# Patient Record
Sex: Male | Born: 1935
Health system: Southern US, Community
[De-identification: ages and names within clinical notes are randomized; demographics above are authoritative.]

## PROBLEM LIST (undated history)

## (undated) DIAGNOSIS — J449 Chronic obstructive pulmonary disease, unspecified: Secondary | ICD-10-CM

## (undated) DIAGNOSIS — K219 Gastro-esophageal reflux disease without esophagitis: Secondary | ICD-10-CM

## (undated) DIAGNOSIS — D696 Thrombocytopenia, unspecified: Secondary | ICD-10-CM

## (undated) DIAGNOSIS — E782 Mixed hyperlipidemia: Secondary | ICD-10-CM

## (undated) DIAGNOSIS — I872 Venous insufficiency (chronic) (peripheral): Secondary | ICD-10-CM

## (undated) DIAGNOSIS — G4730 Sleep apnea, unspecified: Secondary | ICD-10-CM

## (undated) DIAGNOSIS — I272 Pulmonary hypertension, unspecified: Secondary | ICD-10-CM

## (undated) DIAGNOSIS — E119 Type 2 diabetes mellitus without complications: Secondary | ICD-10-CM

## (undated) DIAGNOSIS — Z950 Presence of cardiac pacemaker: Secondary | ICD-10-CM

## (undated) HISTORY — DX: Presence of cardiac pacemaker: Z95.0

## (undated) HISTORY — DX: Mixed hyperlipidemia: E78.2

## (undated) HISTORY — DX: Venous insufficiency (chronic) (peripheral): I87.2

## (undated) HISTORY — PX: PACEMAKER IMPLANT: EP1218

## (undated) HISTORY — DX: Thrombocytopenia, unspecified: D69.6

## (undated) HISTORY — DX: Sleep apnea, unspecified: G47.30

## (undated) HISTORY — PX: REFRACTIVE SURGERY: SHX103

## (undated) HISTORY — DX: Gastro-esophageal reflux disease without esophagitis: K21.9

## (undated) HISTORY — DX: Pulmonary hypertension, unspecified: I27.20

## (undated) HISTORY — PX: KIDNEY STONE SURGERY: SHX686

## (undated) HISTORY — PX: CATARACT EXTRACTION, BILATERAL: SHX1313

## (undated) HISTORY — DX: Chronic obstructive pulmonary disease, unspecified: J44.9

## (undated) HISTORY — PX: ROTATOR CUFF REPAIR: SHX139

## (undated) HISTORY — DX: Type 2 diabetes mellitus without complications: E11.9

---

## 2011-10-26 DIAGNOSIS — Z0000 Encounter for general adult medical examination without abnormal findings: Secondary | ICD-10-CM | POA: Diagnosis not present

## 2011-10-26 DIAGNOSIS — E119 Type 2 diabetes mellitus without complications: Secondary | ICD-10-CM | POA: Diagnosis not present

## 2011-10-26 DIAGNOSIS — I10 Essential (primary) hypertension: Secondary | ICD-10-CM | POA: Diagnosis not present

## 2011-10-26 DIAGNOSIS — E785 Hyperlipidemia, unspecified: Secondary | ICD-10-CM | POA: Diagnosis not present

## 2011-10-29 DIAGNOSIS — Z0000 Encounter for general adult medical examination without abnormal findings: Secondary | ICD-10-CM | POA: Diagnosis not present

## 2011-10-29 DIAGNOSIS — E119 Type 2 diabetes mellitus without complications: Secondary | ICD-10-CM | POA: Diagnosis not present

## 2012-01-12 DIAGNOSIS — Z45018 Encounter for adjustment and management of other part of cardiac pacemaker: Secondary | ICD-10-CM | POA: Diagnosis not present

## 2012-01-12 DIAGNOSIS — I495 Sick sinus syndrome: Secondary | ICD-10-CM | POA: Diagnosis not present

## 2012-04-12 DIAGNOSIS — Z45018 Encounter for adjustment and management of other part of cardiac pacemaker: Secondary | ICD-10-CM | POA: Diagnosis not present

## 2012-04-12 DIAGNOSIS — I495 Sick sinus syndrome: Secondary | ICD-10-CM | POA: Diagnosis not present

## 2012-09-28 DIAGNOSIS — E119 Type 2 diabetes mellitus without complications: Secondary | ICD-10-CM | POA: Diagnosis not present

## 2012-09-28 DIAGNOSIS — I10 Essential (primary) hypertension: Secondary | ICD-10-CM | POA: Diagnosis not present

## 2012-09-28 DIAGNOSIS — Z950 Presence of cardiac pacemaker: Secondary | ICD-10-CM | POA: Diagnosis not present

## 2012-10-26 DIAGNOSIS — E119 Type 2 diabetes mellitus without complications: Secondary | ICD-10-CM | POA: Diagnosis not present

## 2012-10-26 DIAGNOSIS — Z0000 Encounter for general adult medical examination without abnormal findings: Secondary | ICD-10-CM | POA: Diagnosis not present

## 2012-10-26 DIAGNOSIS — K5900 Constipation, unspecified: Secondary | ICD-10-CM | POA: Diagnosis not present

## 2012-10-26 DIAGNOSIS — Z23 Encounter for immunization: Secondary | ICD-10-CM | POA: Diagnosis not present

## 2012-10-26 DIAGNOSIS — E785 Hyperlipidemia, unspecified: Secondary | ICD-10-CM | POA: Diagnosis not present

## 2012-11-20 DIAGNOSIS — I10 Essential (primary) hypertension: Secondary | ICD-10-CM | POA: Diagnosis not present

## 2012-11-20 DIAGNOSIS — E782 Mixed hyperlipidemia: Secondary | ICD-10-CM | POA: Diagnosis not present

## 2012-11-20 DIAGNOSIS — E119 Type 2 diabetes mellitus without complications: Secondary | ICD-10-CM | POA: Diagnosis not present

## 2013-01-24 DIAGNOSIS — I495 Sick sinus syndrome: Secondary | ICD-10-CM | POA: Diagnosis not present

## 2013-03-06 DIAGNOSIS — E1149 Type 2 diabetes mellitus with other diabetic neurological complication: Secondary | ICD-10-CM | POA: Diagnosis not present

## 2013-04-04 DIAGNOSIS — E119 Type 2 diabetes mellitus without complications: Secondary | ICD-10-CM | POA: Diagnosis not present

## 2013-04-04 DIAGNOSIS — E782 Mixed hyperlipidemia: Secondary | ICD-10-CM | POA: Diagnosis not present

## 2013-04-04 DIAGNOSIS — E785 Hyperlipidemia, unspecified: Secondary | ICD-10-CM | POA: Diagnosis not present

## 2013-04-04 DIAGNOSIS — J449 Chronic obstructive pulmonary disease, unspecified: Secondary | ICD-10-CM | POA: Diagnosis not present

## 2013-04-04 DIAGNOSIS — I10 Essential (primary) hypertension: Secondary | ICD-10-CM | POA: Diagnosis not present

## 2013-04-25 DIAGNOSIS — I495 Sick sinus syndrome: Secondary | ICD-10-CM | POA: Diagnosis not present

## 2013-05-15 DIAGNOSIS — E1149 Type 2 diabetes mellitus with other diabetic neurological complication: Secondary | ICD-10-CM | POA: Diagnosis not present

## 2013-07-19 DIAGNOSIS — E119 Type 2 diabetes mellitus without complications: Secondary | ICD-10-CM | POA: Diagnosis not present

## 2013-07-19 DIAGNOSIS — E782 Mixed hyperlipidemia: Secondary | ICD-10-CM | POA: Diagnosis not present

## 2013-07-19 DIAGNOSIS — I10 Essential (primary) hypertension: Secondary | ICD-10-CM | POA: Diagnosis not present

## 2013-07-19 DIAGNOSIS — J449 Chronic obstructive pulmonary disease, unspecified: Secondary | ICD-10-CM | POA: Diagnosis not present

## 2013-07-19 DIAGNOSIS — Z23 Encounter for immunization: Secondary | ICD-10-CM | POA: Diagnosis not present

## 2013-07-24 DIAGNOSIS — E1149 Type 2 diabetes mellitus with other diabetic neurological complication: Secondary | ICD-10-CM | POA: Diagnosis not present

## 2013-07-25 DIAGNOSIS — I10 Essential (primary) hypertension: Secondary | ICD-10-CM | POA: Diagnosis not present

## 2013-07-25 DIAGNOSIS — E119 Type 2 diabetes mellitus without complications: Secondary | ICD-10-CM | POA: Diagnosis not present

## 2013-07-25 DIAGNOSIS — E785 Hyperlipidemia, unspecified: Secondary | ICD-10-CM | POA: Diagnosis not present

## 2013-10-02 DIAGNOSIS — E1149 Type 2 diabetes mellitus with other diabetic neurological complication: Secondary | ICD-10-CM | POA: Diagnosis not present

## 2013-11-20 DIAGNOSIS — E119 Type 2 diabetes mellitus without complications: Secondary | ICD-10-CM | POA: Diagnosis not present

## 2013-11-20 DIAGNOSIS — I10 Essential (primary) hypertension: Secondary | ICD-10-CM | POA: Diagnosis not present

## 2013-11-20 DIAGNOSIS — E785 Hyperlipidemia, unspecified: Secondary | ICD-10-CM | POA: Diagnosis not present

## 2013-11-22 DIAGNOSIS — E119 Type 2 diabetes mellitus without complications: Secondary | ICD-10-CM | POA: Diagnosis not present

## 2013-11-22 DIAGNOSIS — E782 Mixed hyperlipidemia: Secondary | ICD-10-CM | POA: Diagnosis not present

## 2013-11-22 DIAGNOSIS — J449 Chronic obstructive pulmonary disease, unspecified: Secondary | ICD-10-CM | POA: Diagnosis not present

## 2013-11-22 DIAGNOSIS — I10 Essential (primary) hypertension: Secondary | ICD-10-CM | POA: Diagnosis not present

## 2014-01-22 DIAGNOSIS — E1149 Type 2 diabetes mellitus with other diabetic neurological complication: Secondary | ICD-10-CM | POA: Diagnosis not present

## 2014-03-08 DIAGNOSIS — E119 Type 2 diabetes mellitus without complications: Secondary | ICD-10-CM | POA: Diagnosis not present

## 2014-03-12 DIAGNOSIS — E119 Type 2 diabetes mellitus without complications: Secondary | ICD-10-CM | POA: Diagnosis not present

## 2014-03-12 DIAGNOSIS — E1142 Type 2 diabetes mellitus with diabetic polyneuropathy: Secondary | ICD-10-CM | POA: Diagnosis not present

## 2014-03-12 DIAGNOSIS — I10 Essential (primary) hypertension: Secondary | ICD-10-CM | POA: Diagnosis not present

## 2014-06-07 DIAGNOSIS — I10 Essential (primary) hypertension: Secondary | ICD-10-CM | POA: Diagnosis not present

## 2014-06-07 DIAGNOSIS — E119 Type 2 diabetes mellitus without complications: Secondary | ICD-10-CM | POA: Diagnosis not present

## 2014-06-11 DIAGNOSIS — E119 Type 2 diabetes mellitus without complications: Secondary | ICD-10-CM | POA: Diagnosis not present

## 2014-06-11 DIAGNOSIS — E1142 Type 2 diabetes mellitus with diabetic polyneuropathy: Secondary | ICD-10-CM | POA: Diagnosis not present

## 2014-06-11 DIAGNOSIS — I10 Essential (primary) hypertension: Secondary | ICD-10-CM | POA: Diagnosis not present

## 2014-06-11 DIAGNOSIS — J449 Chronic obstructive pulmonary disease, unspecified: Secondary | ICD-10-CM | POA: Diagnosis not present

## 2014-09-17 DIAGNOSIS — E785 Hyperlipidemia, unspecified: Secondary | ICD-10-CM | POA: Diagnosis not present

## 2014-09-17 DIAGNOSIS — E119 Type 2 diabetes mellitus without complications: Secondary | ICD-10-CM | POA: Diagnosis not present

## 2014-09-17 DIAGNOSIS — I10 Essential (primary) hypertension: Secondary | ICD-10-CM | POA: Diagnosis not present

## 2014-09-20 DIAGNOSIS — E785 Hyperlipidemia, unspecified: Secondary | ICD-10-CM | POA: Diagnosis not present

## 2014-09-20 DIAGNOSIS — I10 Essential (primary) hypertension: Secondary | ICD-10-CM | POA: Diagnosis not present

## 2014-09-20 DIAGNOSIS — L97821 Non-pressure chronic ulcer of other part of left lower leg limited to breakdown of skin: Secondary | ICD-10-CM | POA: Diagnosis not present

## 2014-09-20 DIAGNOSIS — Z23 Encounter for immunization: Secondary | ICD-10-CM | POA: Diagnosis not present

## 2014-09-20 DIAGNOSIS — E119 Type 2 diabetes mellitus without complications: Secondary | ICD-10-CM | POA: Diagnosis not present

## 2014-10-24 DIAGNOSIS — E08329 Diabetes mellitus due to underlying condition with mild nonproliferative diabetic retinopathy without macular edema: Secondary | ICD-10-CM | POA: Diagnosis not present

## 2014-10-24 DIAGNOSIS — H3531 Nonexudative age-related macular degeneration: Secondary | ICD-10-CM | POA: Diagnosis not present

## 2015-01-21 DIAGNOSIS — E782 Mixed hyperlipidemia: Secondary | ICD-10-CM | POA: Diagnosis not present

## 2015-01-21 DIAGNOSIS — E119 Type 2 diabetes mellitus without complications: Secondary | ICD-10-CM | POA: Diagnosis not present

## 2015-01-21 DIAGNOSIS — I10 Essential (primary) hypertension: Secondary | ICD-10-CM | POA: Diagnosis not present

## 2015-01-23 DIAGNOSIS — D696 Thrombocytopenia, unspecified: Secondary | ICD-10-CM | POA: Diagnosis not present

## 2015-01-23 DIAGNOSIS — I10 Essential (primary) hypertension: Secondary | ICD-10-CM | POA: Diagnosis not present

## 2015-01-23 DIAGNOSIS — E119 Type 2 diabetes mellitus without complications: Secondary | ICD-10-CM | POA: Diagnosis not present

## 2015-01-23 DIAGNOSIS — I872 Venous insufficiency (chronic) (peripheral): Secondary | ICD-10-CM | POA: Diagnosis not present

## 2015-05-30 DIAGNOSIS — I10 Essential (primary) hypertension: Secondary | ICD-10-CM | POA: Diagnosis not present

## 2015-05-30 DIAGNOSIS — E782 Mixed hyperlipidemia: Secondary | ICD-10-CM | POA: Diagnosis not present

## 2015-05-30 DIAGNOSIS — E119 Type 2 diabetes mellitus without complications: Secondary | ICD-10-CM | POA: Diagnosis not present

## 2015-06-10 DIAGNOSIS — G589 Mononeuropathy, unspecified: Secondary | ICD-10-CM | POA: Diagnosis not present

## 2015-06-10 DIAGNOSIS — I10 Essential (primary) hypertension: Secondary | ICD-10-CM | POA: Diagnosis not present

## 2015-06-10 DIAGNOSIS — L308 Other specified dermatitis: Secondary | ICD-10-CM | POA: Diagnosis not present

## 2015-06-10 DIAGNOSIS — E1140 Type 2 diabetes mellitus with diabetic neuropathy, unspecified: Secondary | ICD-10-CM | POA: Diagnosis not present

## 2015-06-19 ENCOUNTER — Encounter: Payer: Self-pay | Admitting: *Deleted

## 2015-06-19 ENCOUNTER — Other Ambulatory Visit: Payer: Self-pay | Admitting: *Deleted

## 2015-06-20 ENCOUNTER — Ambulatory Visit: Payer: Self-pay | Admitting: Cardiovascular Disease

## 2015-07-01 ENCOUNTER — Ambulatory Visit (INDEPENDENT_AMBULATORY_CARE_PROVIDER_SITE_OTHER): Payer: Medicare Other | Admitting: Cardiology

## 2015-07-01 ENCOUNTER — Encounter: Payer: Self-pay | Admitting: Cardiology

## 2015-07-01 VITALS — BP 148/85 | HR 75 | Ht 72.0 in | Wt 305.0 lb

## 2015-07-01 DIAGNOSIS — E785 Hyperlipidemia, unspecified: Secondary | ICD-10-CM | POA: Diagnosis not present

## 2015-07-01 DIAGNOSIS — I10 Essential (primary) hypertension: Secondary | ICD-10-CM

## 2015-07-01 DIAGNOSIS — Z136 Encounter for screening for cardiovascular disorders: Secondary | ICD-10-CM

## 2015-07-01 DIAGNOSIS — Z950 Presence of cardiac pacemaker: Secondary | ICD-10-CM

## 2015-07-01 NOTE — Progress Notes (Signed)
Patient ID: Alan Briggs, male   DOB: 12-25-1921, 81 y.o.   MRN: [id number]     Clinical Summary Alan Briggs is a 81 y.o.male seen today as a new patient for the following medical problems.  1.Sick sinus syndrome - history of pacemaker placement in 1996. Replaced in 2008 - previously followed at [facility]. From there notes he has a MDT Adapta DR device placed in 2008. Has not been checked in over a year - denies any lightheadnedness, no fatigue.    2. DM2 - followed by pcp   3. Hyperlipidemia - compliant with statin  4. HTN - does not check regularly - compliant with meds  5. OSA - history of OSA, has not wanted to wear CPAP  Past Medical History  Diagnosis Date  . Diabetes   . Cardiac pacemaker in situ     for sick sinus syndrome-last placement was 09/2007 [facility]  . Mixed hyperlipidemia   . Venous insufficiency (chronic) (peripheral)   . Thrombocytopenia   . Sleep apnea   . Pulmonary hypertension   . Obstructive lung disease     PFT's done 04/22/06   . GERD (gastroesophageal reflux disease)      Allergies  Allergen Reactions  . Advair Diskus [Fluticasone-Salmeterol]     Told by MD not to use  . Combivent [Ipratropium-Albuterol]     Told by MD not to use  . Lotensin Hct [Benazepril-Hydrochlorothiazide]     Told by MD not to use  . Penicillins Swelling  . Simvastatin Other (See Comments)    myalgias     Current Outpatient Prescriptions  Medication Sig Dispense Refill  . acetaminophen (TYLENOL) 500 MG tablet Take 500 mg by mouth every 6 (six) hours as needed.    Marland Kitchen aspirin EC 81 MG tablet Take 1 tablet by mouth daily.    Marland Kitchen atenolol (TENORMIN) 50 MG tablet Take 1 tablet by mouth daily.    . carboxymethylcellulose (REFRESH PLUS) 0.5 % SOLN Place 1 drop into both eyes 3 (three) times daily as needed.    . hydrochlorothiazide (HYDRODIURIL) 25 MG tablet Take 1 tablet by mouth daily.    . insulin glargine (LANTUS) 100 UNIT/ML injection Inject 55 Units into  the skin 2 (two) times daily.    Marland Kitchen latanoprost (XALATAN) 0.005 % ophthalmic solution Place 1 drop into the right eye at bedtime.    Marland Kitchen losartan (COZAAR) 50 MG tablet Take 1 tablet by mouth daily.    . Multiple Vitamins-Minerals (MULTIVITAMIN WITH MINERALS) tablet Take 1 tablet by mouth daily.    . Omega-3 1000 MG CAPS Take 2 g by mouth daily.    . rosuvastatin (CRESTOR) 10 MG tablet Take 10 mg by mouth 3 (three) times a week.     No current facility-administered medications for this visit.     Past Surgical History  Procedure Laterality Date  . Refractive surgery Right   . Cataract extraction, bilateral Bilateral   . Rotator cuff repair Left   . Kidney stone surgery Left     laser ablation      Allergies  Allergen Reactions  . Advair Diskus [Fluticasone-Salmeterol]     Told by MD not to use  . Combivent [Ipratropium-Albuterol]     Told by MD not to use  . Lotensin Hct [Benazepril-Hydrochlorothiazide]     Told by MD not to use  . Penicillins Swelling  . Simvastatin Other (See Comments)    myalgias      Family History  Problem  Relation Age of Onset  . Diabetes Mother      Social History Alan Briggs reports that he quit smoking about 36 years ago. His smoking use included Cigarettes. He started smoking about 66 years ago. He has a 15 pack-year smoking history. He has never used smokeless tobacco. Alan Briggs has no alcohol history on file.   Review of Systems CONSTITUTIONAL: No weight loss, fever, chills, weakness or fatigue.  HEENT: Eyes: No visual loss, blurred vision, double vision or yellow sclerae.No hearing loss, sneezing, congestion, runny nose or sore throat.  SKIN: No rash or itching.  CARDIOVASCULAR: per HPI RESPIRATORY: No shortness of breath, cough or sputum.  GASTROINTESTINAL: No anorexia, nausea, vomiting or diarrhea. No abdominal pain or blood.  GENITOURINARY: No burning on urination, no polyuria NEUROLOGICAL: No headache, dizziness, syncope, paralysis,  ataxia, numbness or tingling in the extremities. No change in bowel or bladder control.  MUSCULOSKELETAL: No muscle, back pain, joint pain or stiffness.  LYMPHATICS: No enlarged nodes. No history of splenectomy.  PSYCHIATRIC: No history of depression or anxiety.  ENDOCRINOLOGIC: No reports of sweating, cold or heat intolerance. No polyuria or polydipsia.  Marland Kitchen   Physical Examination Filed Vitals:   07/01/15 1341  BP: 148/85  Pulse: 75   Filed Vitals:   07/01/15 1341  Height: 6' (1.829 m)  Weight: 305 lb (138.347 kg)    Gen: resting comfortably, no acute distress HEENT: no scleral icterus, pupils equal round and reactive, no palptable cervical adenopathy,  CV: RRR, no m/r/g, no JVD Resp: Clear to auscultation bilaterally GI: abdomen is soft, non-tender, non-distended, normal bowel sounds, no hepatosplenomegaly MSK: extremities are warm, no edema.  Skin: warm, no rash Neuro:  no focal deficits Psych: appropriate affect    Assessment and Plan  1. Sick sinus syndrome - denies any current symptoms - will have him establish in our device clinic  2. Hyperlipidemia - request labs from pcp - continue statin  3. HTN - borderline control given his age, will continue to follow at this time.        Antoine Poche, M.D.

## 2015-07-01 NOTE — Patient Instructions (Signed)
Your physician wants you to follow-up in: 6 months with Dr. Lurena Joiner will receive a reminder letter in the mail two months in advance. If you don't receive a letter, please call our office to schedule the follow-up appointment.  Your physician recommends that you continue on your current medications as directed. Please refer to the Current Medication list given to you today.  You have been referred to DR. Ladona Ridgel [place]  Thank you for choosing [facility]!!

## 2015-07-28 ENCOUNTER — Encounter: Payer: BLUE CROSS/BLUE SHIELD | Admitting: Internal Medicine

## 2015-08-25 DIAGNOSIS — G589 Mononeuropathy, unspecified: Secondary | ICD-10-CM | POA: Diagnosis not present

## 2015-08-25 DIAGNOSIS — H6120 Impacted cerumen, unspecified ear: Secondary | ICD-10-CM | POA: Diagnosis not present

## 2015-08-25 DIAGNOSIS — I10 Essential (primary) hypertension: Secondary | ICD-10-CM | POA: Diagnosis not present

## 2015-08-25 DIAGNOSIS — R0602 Shortness of breath: Secondary | ICD-10-CM | POA: Diagnosis not present

## 2015-09-30 ENCOUNTER — Encounter: Payer: Self-pay | Admitting: Internal Medicine

## 2015-09-30 ENCOUNTER — Ambulatory Visit (INDEPENDENT_AMBULATORY_CARE_PROVIDER_SITE_OTHER): Payer: Medicare Other | Admitting: Internal Medicine

## 2015-09-30 VITALS — BP 138/82 | HR 76 | Ht 69.0 in | Wt 302.0 lb

## 2015-09-30 DIAGNOSIS — I442 Atrioventricular block, complete: Secondary | ICD-10-CM | POA: Diagnosis not present

## 2015-09-30 DIAGNOSIS — I5032 Chronic diastolic (congestive) heart failure: Secondary | ICD-10-CM

## 2015-09-30 DIAGNOSIS — Z950 Presence of cardiac pacemaker: Secondary | ICD-10-CM | POA: Diagnosis not present

## 2015-09-30 DIAGNOSIS — I872 Venous insufficiency (chronic) (peripheral): Secondary | ICD-10-CM | POA: Diagnosis not present

## 2015-09-30 DIAGNOSIS — I5033 Acute on chronic diastolic (congestive) heart failure: Secondary | ICD-10-CM | POA: Insufficient documentation

## 2015-09-30 NOTE — Progress Notes (Signed)
HPI Mr. Thal is here today for initial evaluation and management of his DDD PM. He is a pleasant elderly man with multiple medical problems including diastolic heart failure, obesity, copd, DM, HTN, and symptomatic bradycardia due to CHB, s/p PPM insertion. He is here to establish care, having gone to [facility] the past 20 years. No other complaints. He has chronic class 2 dyspnea and is sedentary and abuses sodium and lives by himself.  Allergies  Allergen Reactions  . Advair Diskus [Fluticasone-Salmeterol]     Told by MD not to use  . Combivent [Ipratropium-Albuterol]     Told by MD not to use  . Lotensin Hct [Benazepril-Hydrochlorothiazide]     Told by MD not to use  . Penicillins Swelling  . Simvastatin Other (See Comments)    myalgias     Current Outpatient Prescriptions  Medication Sig Dispense Refill  . acetaminophen (TYLENOL) 500 MG tablet Take 500 mg by mouth every 6 (six) hours as needed.    Marland Kitchen aspirin EC 81 MG tablet Take 1 tablet by mouth daily.    Marland Kitchen atenolol (TENORMIN) 50 MG tablet Take 1 tablet by mouth daily.    . carboxymethylcellulose (REFRESH PLUS) 0.5 % SOLN Place 1 drop into both eyes 3 (three) times daily as needed.    . hydrochlorothiazide (HYDRODIURIL) 25 MG tablet Take 1 tablet by mouth daily.    . insulin glargine (LANTUS) 100 UNIT/ML injection Inject 56 Units into the skin 2 (two) times daily.     . insulin lispro (HUMALOG) 100 UNIT/ML injection Inject 18 Units into the skin 3 (three) times daily before meals.     Marland Kitchen losartan (COZAAR) 50 MG tablet Take 1 tablet by mouth daily.    . Multiple Vitamins-Minerals (MULTIVITAMIN WITH MINERALS) tablet Take 1 tablet by mouth daily.    . Omega-3 1000 MG CAPS Take 1 g by mouth daily.      No current facility-administered medications for this visit.     Past Medical History  Diagnosis Date  . Diabetes (HCC)   . Cardiac pacemaker in situ     for sick sinus syndrome-last placement was 09/2007 [facility]    . Mixed hyperlipidemia   . Venous insufficiency (chronic) (peripheral)   . Thrombocytopenia (HCC)   . Sleep apnea   . Pulmonary hypertension (HCC)   . Obstructive lung disease (HCC)     PFT's done 04/22/06   . GERD (gastroesophageal reflux disease)     ROS:   All systems reviewed and negative except as noted in the HPI.   Past Surgical History  Procedure Laterality Date  . Refractive surgery Right   . Cataract extraction, bilateral Bilateral   . Rotator cuff repair Left   . Kidney stone surgery Left     laser ablation      Family History  Problem Relation Age of Onset  . Diabetes Mother      Social History   Social History  . Marital Status: Married    Spouse Name: N/A  . Number of Children: N/A  . Years of Education: N/A   Occupational History  . Not on file.   Social History Main Topics  . Smoking status: Former Smoker -- 0.50 packs/day for 30 years    Types: Cigarettes    Start date: 10/18/1948    Quit date: 10/18/1978  . Smokeless tobacco: Never Used  . Alcohol Use: Not on file  . Drug Use: Not on file  .  Sexual Activity: Not on file   Other Topics Concern  . Not on file   Social History Narrative     BP 138/82 mmHg  Pulse 76  Ht 5\' 9"  (1.753 m)  Wt 302 lb (136.986 kg)  BMI 44.58 kg/m2  SpO2 92%  Physical Exam:  Well appearing 81 yo man, NAD HEENT: Unremarkable Neck:  7 cm JVD, no thyromegally Lymphatics:  No adenopathy Back:  No CVA tenderness Lungs:  Clear except for basilar rales. HEART:  Regular rate rhythm, distant, no murmurs, no rubs, no clicks Abd:  soft, positive bowel sounds, no organomegally, no rebound, no guarding Ext:  2 plus pulses, 2+ peripheral edema, no cyanosis, no clubbing Skin:  No rashes no nodules Neuro:  CN II through XII intact, motor grossly intact  DEVICE  Normal device function.  See PaceArt for details.   Assess/Plan:

## 2015-09-30 NOTE — Assessment & Plan Note (Signed)
His medtronic device is working normally. Will recheck in several months.  

## 2015-09-30 NOTE — Patient Instructions (Signed)
Your physician wants you to follow-up in: 6 Months with Dr. Ladona Ridgelaylor. You will receive a reminder letter in the mail two months in advance. If you don't receive a letter, please call our office to schedule the follow-up appointment.  Remote monitoring is used to monitor your Pacemaker of ICD from home. This monitoring reduces the number of office visits required to check your device to one time per year. It allows us to keep an eye on the functioning of your device to ensure it is working properly. You are scheduled for a device check from home on 12/30/15. You may send your transmission at any time that day. If you have a wireless device, the transmission will be sent automatically. After your physician reviews your transmission, you will receive a postcard with your next transmission date.  Your physician recommends that you continue on your current medications as directed. Please refer to the Current Medication list given to you today.  If you need a refill on your cardiac medications before your next appointment, please call your pharmacy.  Thank you for choosing [facility]!

## 2015-09-30 NOTE — Assessment & Plan Note (Signed)
His symptoms are class 2B. He is sedentary. No change in meds although I would consider adding lasix if volume overload becomes a problem. He is strongly encouraged to lose weight. Also to reduce his salt intake.

## 2015-10-07 DIAGNOSIS — I10 Essential (primary) hypertension: Secondary | ICD-10-CM | POA: Diagnosis not present

## 2015-10-07 DIAGNOSIS — E1140 Type 2 diabetes mellitus with diabetic neuropathy, unspecified: Secondary | ICD-10-CM | POA: Diagnosis not present

## 2015-10-09 DIAGNOSIS — I10 Essential (primary) hypertension: Secondary | ICD-10-CM | POA: Diagnosis not present

## 2015-10-09 DIAGNOSIS — D696 Thrombocytopenia, unspecified: Secondary | ICD-10-CM | POA: Diagnosis not present

## 2015-10-09 DIAGNOSIS — L308 Other specified dermatitis: Secondary | ICD-10-CM | POA: Diagnosis not present

## 2015-10-09 DIAGNOSIS — G589 Mononeuropathy, unspecified: Secondary | ICD-10-CM | POA: Diagnosis not present

## 2015-10-09 DIAGNOSIS — Z950 Presence of cardiac pacemaker: Secondary | ICD-10-CM | POA: Diagnosis not present

## 2015-10-09 DIAGNOSIS — E1141 Type 2 diabetes mellitus with diabetic mononeuropathy: Secondary | ICD-10-CM | POA: Diagnosis not present

## 2015-12-30 ENCOUNTER — Ambulatory Visit (INDEPENDENT_AMBULATORY_CARE_PROVIDER_SITE_OTHER): Payer: Medicare Other | Admitting: *Deleted

## 2015-12-30 ENCOUNTER — Telehealth: Payer: Self-pay | Admitting: Cardiology

## 2015-12-30 DIAGNOSIS — Z950 Presence of cardiac pacemaker: Secondary | ICD-10-CM

## 2015-12-30 DIAGNOSIS — I442 Atrioventricular block, complete: Secondary | ICD-10-CM | POA: Diagnosis not present

## 2015-12-30 NOTE — Telephone Encounter (Signed)
Spoke with pt and reminded pt of remote transmission that is due today. Pt verbalized understanding.   

## 2015-12-31 NOTE — Progress Notes (Signed)
Remote pacemaker transmission.   

## 2016-01-01 ENCOUNTER — Ambulatory Visit (INDEPENDENT_AMBULATORY_CARE_PROVIDER_SITE_OTHER): Payer: Medicare Other | Admitting: Cardiology

## 2016-01-01 ENCOUNTER — Encounter: Payer: Self-pay | Admitting: Cardiology

## 2016-01-01 ENCOUNTER — Encounter: Payer: Self-pay | Admitting: *Deleted

## 2016-01-01 VITALS — BP 155/77 | HR 79 | Ht 72.0 in | Wt 306.0 lb

## 2016-01-01 DIAGNOSIS — I10 Essential (primary) hypertension: Secondary | ICD-10-CM | POA: Diagnosis not present

## 2016-01-01 DIAGNOSIS — I495 Sick sinus syndrome: Secondary | ICD-10-CM

## 2016-01-01 DIAGNOSIS — E785 Hyperlipidemia, unspecified: Secondary | ICD-10-CM

## 2016-01-01 MED ORDER — FUROSEMIDE 20 MG PO TABS: 20.0000 mg | ORAL_TABLET | Freq: Every day | ORAL | Status: DC | PRN

## 2016-01-01 NOTE — Patient Instructions (Signed)
Your physician wants you to follow-up in: 6 MONTHS WITH DR. BRANCH You will receive a reminder letter in the mail two months in advance. If you don't receive a letter, please call our office to schedule the follow-up appointment.  Your physician has recommended you make the following change in your medication:   START LASIX 20 MG DAILY AS NEEDED FOR SWELLING  Thank you for choosing [facility]!!     

## 2016-01-01 NOTE — Progress Notes (Signed)
Patient ID: Alan Briggs, male   DOB: May 22, 1922, 82 y.o.   MRN: [id number]     Clinical Summary Alan Briggs is a 82 y.o.male seen today for follow up of the following medical problems.    1.Sick sinus syndrome - history of pacemaker placement in 1996. Replaced in 2008 - previously followed at [facility]. From there notes he has a MDT Adapta DR device placed in 2008.  - device check 09/2015 with Dr Ladona Ridgel with normal function  - denies any lightheadnedness, no fatigue since last visit   2. DM2 - followed by pcp   3. Hyperlipidemia - compliant with statin - has upcoming labs with pcp  4. HTN - does not check regularly - compliant with meds  5. OSA - history of OSA, has not wanted to wear CPAP Past Medical History  Diagnosis Date  . Diabetes (HCC)   . Cardiac pacemaker in situ     for sick sinus syndrome-last placement was 09/2007 [facility]  . Mixed hyperlipidemia   . Venous insufficiency (chronic) (peripheral)   . Thrombocytopenia (HCC)   . Sleep apnea   . Pulmonary hypertension (HCC)   . Obstructive lung disease (HCC)     PFT's done 04/22/06   . GERD (gastroesophageal reflux disease)      Allergies  Allergen Reactions  . Advair Diskus [Fluticasone-Salmeterol]     Told by MD not to use  . Combivent [Ipratropium-Albuterol]     Told by MD not to use  . Lotensin Hct [Benazepril-Hydrochlorothiazide]     Told by MD not to use  . Penicillins Swelling  . Simvastatin Other (See Comments)    myalgias     Current Outpatient Prescriptions  Medication Sig Dispense Refill  . acetaminophen (TYLENOL) 500 MG tablet Take 500 mg by mouth every 6 (six) hours as needed.    Marland Kitchen aspirin EC 81 MG tablet Take 1 tablet by mouth daily.    Marland Kitchen atenolol (TENORMIN) 50 MG tablet Take 1 tablet by mouth daily.    . carboxymethylcellulose (REFRESH PLUS) 0.5 % SOLN Place 1 drop into both eyes 3 (three) times daily as needed.    . hydrochlorothiazide (HYDRODIURIL) 25 MG tablet Take 1 tablet  by mouth daily.    . insulin glargine (LANTUS) 100 UNIT/ML injection Inject 56 Units into the skin 2 (two) times daily.     . insulin lispro (HUMALOG) 100 UNIT/ML injection Inject 18 Units into the skin 3 (three) times daily before meals.     Marland Kitchen losartan (COZAAR) 50 MG tablet Take 1 tablet by mouth daily.    . Multiple Vitamins-Minerals (MULTIVITAMIN WITH MINERALS) tablet Take 1 tablet by mouth daily.    . Omega-3 1000 MG CAPS Take 1 g by mouth daily.      No current facility-administered medications for this visit.     Past Surgical History  Procedure Laterality Date  . Refractive surgery Right   . Cataract extraction, bilateral Bilateral   . Rotator cuff repair Left   . Kidney stone surgery Left     laser ablation      Allergies  Allergen Reactions  . Advair Diskus [Fluticasone-Salmeterol]     Told by MD not to use  . Combivent [Ipratropium-Albuterol]     Told by MD not to use  . Lotensin Hct [Benazepril-Hydrochlorothiazide]     Told by MD not to use  . Penicillins Swelling  . Simvastatin Other (See Comments)    myalgias      Family  History  Problem Relation Age of Onset  . Diabetes Mother      Social History Alan Briggs reports that he quit smoking about 37 years ago. His smoking use included Cigarettes. He started smoking about 67 years ago. He has a 15 pack-year smoking history. He has never used smokeless tobacco. Alan Briggs has no alcohol history on file.   Review of Systems CONSTITUTIONAL: No weight loss, fever, chills, weakness or fatigue.  HEENT: Eyes: No visual loss, blurred vision, double vision or yellow sclerae.No hearing loss, sneezing, congestion, runny nose or sore throat.  SKIN: No rash or itching.  CARDIOVASCULAR: per HPI RESPIRATORY: No shortness of breath, cough or sputum.  GASTROINTESTINAL: No anorexia, nausea, vomiting or diarrhea. No abdominal pain or blood.  GENITOURINARY: No burning on urination, no polyuria NEUROLOGICAL: No headache,  dizziness, syncope, paralysis, ataxia, numbness or tingling in the extremities. No change in bowel or bladder control.  MUSCULOSKELETAL: No muscle, back pain, joint pain or stiffness.  LYMPHATICS: No enlarged nodes. No history of splenectomy.  PSYCHIATRIC: No history of depression or anxiety.  ENDOCRINOLOGIC: No reports of sweating, cold or heat intolerance. No polyuria or polydipsia.  Marland Kitchen.   Physical Examination Filed Vitals:   01/01/16 1321 01/01/16 1330  BP: 166/100 155/77  Pulse: 82 79   Filed Vitals:   01/01/16 1321  Height: 6' (1.829 m)  Weight: 306 lb (138.801 kg)    Gen: resting comfortably, no acute distress HEENT: no scleral icterus, pupils equal round and reactive, no palptable cervical adenopathy,  CV: RRR, no m/r/g, no jvd Resp: Clear to auscultation bilaterally GI: abdomen is soft, non-tender, non-distended, normal bowel sounds, no hepatosplenomegaly MSK: extremities are warm, no edema.  Skin: warm, no rash Neuro:  no focal deficits Psych: appropriate affect     Assessment and Plan   1. Sick sinus syndrome - denies any current symptoms - continue regular device checks  2. Hyperlipidemia - request labs from pcp - we will continue current statin  3. HTN - reasonable control given her age, continue current meds - occas LE edema, will give lasix 20mg  prn.   F/u 6 months   Antoine PocheJonathan F. Jonel Sick, M.D.

## 2016-01-13 DIAGNOSIS — I10 Essential (primary) hypertension: Secondary | ICD-10-CM | POA: Diagnosis not present

## 2016-01-13 DIAGNOSIS — E1141 Type 2 diabetes mellitus with diabetic mononeuropathy: Secondary | ICD-10-CM | POA: Diagnosis not present

## 2016-01-15 DIAGNOSIS — J449 Chronic obstructive pulmonary disease, unspecified: Secondary | ICD-10-CM | POA: Diagnosis not present

## 2016-01-15 DIAGNOSIS — E1165 Type 2 diabetes mellitus with hyperglycemia: Secondary | ICD-10-CM | POA: Diagnosis not present

## 2016-01-15 DIAGNOSIS — D696 Thrombocytopenia, unspecified: Secondary | ICD-10-CM | POA: Diagnosis not present

## 2016-01-15 DIAGNOSIS — E782 Mixed hyperlipidemia: Secondary | ICD-10-CM | POA: Diagnosis not present

## 2016-03-25 LAB — CUP PACEART REMOTE DEVICE CHECK
Battery Impedance: 1689 Ohm
Battery Remaining Longevity: 36 mo
Battery Voltage: 2.77 V
Brady Statistic AS VS Percent: 0 %
Implantable Lead Implant Date: 19960418
Implantable Lead Location: 753860
Lead Channel Impedance Value: 905 Ohm
Lead Channel Impedance Value: 936 Ohm
Lead Channel Setting Pacing Amplitude: 2 V
Lead Channel Setting Pacing Amplitude: 2.5 V
Lead Channel Setting Pacing Pulse Width: 0.4 ms
MDC IDC LEAD IMPLANT DT: 19960418
MDC IDC LEAD LOCATION: 753859
MDC IDC MSMT LEADCHNL RA PACING THRESHOLD AMPLITUDE: 0.5 V
MDC IDC MSMT LEADCHNL RA PACING THRESHOLD PULSEWIDTH: 0.4 ms
MDC IDC MSMT LEADCHNL RA SENSING INTR AMPL: 2.8 mV
MDC IDC MSMT LEADCHNL RV PACING THRESHOLD AMPLITUDE: 1.125 V
MDC IDC MSMT LEADCHNL RV PACING THRESHOLD PULSEWIDTH: 0.4 ms
MDC IDC SESS DTM: 20170314163814
MDC IDC SET LEADCHNL RV SENSING SENSITIVITY: 2 mV
MDC IDC STAT BRADY AP VP PERCENT: 79 %
MDC IDC STAT BRADY AP VS PERCENT: 0 %
MDC IDC STAT BRADY AS VP PERCENT: 20 %

## 2016-03-25 LAB — CUP PACEART INCLINIC DEVICE CHECK
Battery Remaining Longevity: 47 mo
Battery Voltage: 2.78 V
Implantable Lead Implant Date: 19960418
Implantable Lead Location: 753859
Implantable Lead Model: 5034
Implantable Lead Model: 5534
Lead Channel Impedance Value: 884 Ohm
Lead Channel Pacing Threshold Amplitude: 0.5 V
Lead Channel Pacing Threshold Amplitude: 1.125 V
Lead Channel Pacing Threshold Pulse Width: 0.4 ms
Lead Channel Pacing Threshold Pulse Width: 0.4 ms
Lead Channel Sensing Intrinsic Amplitude: 2 mV
Lead Channel Setting Pacing Amplitude: 2.5 V
Lead Channel Setting Pacing Pulse Width: 0.4 ms
MDC IDC LEAD IMPLANT DT: 19960418
MDC IDC LEAD LOCATION: 753860
MDC IDC MSMT BATTERY IMPEDANCE: 1486 Ohm
MDC IDC MSMT LEADCHNL RA PACING THRESHOLD AMPLITUDE: 0.5 V
MDC IDC MSMT LEADCHNL RA PACING THRESHOLD PULSEWIDTH: 0.4 ms
MDC IDC MSMT LEADCHNL RV IMPEDANCE VALUE: 1016 Ohm
MDC IDC MSMT LEADCHNL RV PACING THRESHOLD AMPLITUDE: 1 V
MDC IDC MSMT LEADCHNL RV PACING THRESHOLD PULSEWIDTH: 0.4 ms
MDC IDC SESS DTM: 20161213185553
MDC IDC SET LEADCHNL RA PACING AMPLITUDE: 2 V
MDC IDC SET LEADCHNL RV SENSING SENSITIVITY: 2 mV
MDC IDC STAT BRADY AP VP PERCENT: 23 %
MDC IDC STAT BRADY AP VS PERCENT: 58 %
MDC IDC STAT BRADY AS VP PERCENT: 6 %
MDC IDC STAT BRADY AS VS PERCENT: 12 %

## 2016-04-17 ENCOUNTER — Encounter (HOSPITAL_COMMUNITY): Payer: Self-pay | Admitting: Emergency Medicine

## 2016-04-17 ENCOUNTER — Emergency Department (HOSPITAL_COMMUNITY): Payer: Medicare Other

## 2016-04-17 ENCOUNTER — Inpatient Hospital Stay (HOSPITAL_COMMUNITY)
Admission: EM | Admit: 2016-04-17 | Discharge: 2016-04-19 | DRG: 690 | Disposition: A | Discharge status: Home Health | Payer: Medicare Other | Attending: Internal Medicine | Admitting: Internal Medicine

## 2016-04-17 DIAGNOSIS — Z9842 Cataract extraction status, left eye: Secondary | ICD-10-CM

## 2016-04-17 DIAGNOSIS — K5900 Constipation, unspecified: Secondary | ICD-10-CM | POA: Diagnosis present

## 2016-04-17 DIAGNOSIS — N390 Urinary tract infection, site not specified: Secondary | ICD-10-CM | POA: Diagnosis not present

## 2016-04-17 DIAGNOSIS — Z79899 Other long term (current) drug therapy: Secondary | ICD-10-CM

## 2016-04-17 DIAGNOSIS — Z87442 Personal history of urinary calculi: Secondary | ICD-10-CM | POA: Diagnosis not present

## 2016-04-17 DIAGNOSIS — R52 Pain, unspecified: Secondary | ICD-10-CM

## 2016-04-17 DIAGNOSIS — R1030 Lower abdominal pain, unspecified: Secondary | ICD-10-CM | POA: Diagnosis not present

## 2016-04-17 DIAGNOSIS — K219 Gastro-esophageal reflux disease without esophagitis: Secondary | ICD-10-CM | POA: Diagnosis present

## 2016-04-17 DIAGNOSIS — J449 Chronic obstructive pulmonary disease, unspecified: Secondary | ICD-10-CM | POA: Diagnosis present

## 2016-04-17 DIAGNOSIS — R109 Unspecified abdominal pain: Secondary | ICD-10-CM | POA: Diagnosis not present

## 2016-04-17 DIAGNOSIS — Z950 Presence of cardiac pacemaker: Secondary | ICD-10-CM | POA: Diagnosis not present

## 2016-04-17 DIAGNOSIS — Z87891 Personal history of nicotine dependence: Secondary | ICD-10-CM

## 2016-04-17 DIAGNOSIS — Z794 Long term (current) use of insulin: Secondary | ICD-10-CM

## 2016-04-17 DIAGNOSIS — G4730 Sleep apnea, unspecified: Secondary | ICD-10-CM | POA: Diagnosis present

## 2016-04-17 DIAGNOSIS — Z66 Do not resuscitate: Secondary | ICD-10-CM | POA: Diagnosis present

## 2016-04-17 DIAGNOSIS — K869 Disease of pancreas, unspecified: Secondary | ICD-10-CM | POA: Diagnosis present

## 2016-04-17 DIAGNOSIS — E119 Type 2 diabetes mellitus without complications: Secondary | ICD-10-CM | POA: Diagnosis not present

## 2016-04-17 DIAGNOSIS — I5033 Acute on chronic diastolic (congestive) heart failure: Secondary | ICD-10-CM | POA: Diagnosis present

## 2016-04-17 DIAGNOSIS — Z9841 Cataract extraction status, right eye: Secondary | ICD-10-CM

## 2016-04-17 DIAGNOSIS — I5032 Chronic diastolic (congestive) heart failure: Secondary | ICD-10-CM | POA: Diagnosis present

## 2016-04-17 DIAGNOSIS — E782 Mixed hyperlipidemia: Secondary | ICD-10-CM | POA: Diagnosis present

## 2016-04-17 DIAGNOSIS — I272 Other secondary pulmonary hypertension: Secondary | ICD-10-CM | POA: Diagnosis present

## 2016-04-17 DIAGNOSIS — I110 Hypertensive heart disease with heart failure: Secondary | ICD-10-CM | POA: Diagnosis not present

## 2016-04-17 DIAGNOSIS — R1033 Periumbilical pain: Secondary | ICD-10-CM | POA: Diagnosis not present

## 2016-04-17 DIAGNOSIS — Z7982 Long term (current) use of aspirin: Secondary | ICD-10-CM

## 2016-04-17 DIAGNOSIS — N200 Calculus of kidney: Secondary | ICD-10-CM | POA: Diagnosis not present

## 2016-04-17 LAB — I-STAT CHEM 8, ED
BUN: 22 mg/dL — ABNORMAL HIGH (ref 6–20)
CHLORIDE: 95 mmol/L — AB (ref 101–111)
Calcium, Ion: 1.12 mmol/L (ref 1.12–1.23)
Creatinine, Ser: 1 mg/dL (ref 0.61–1.24)
GLUCOSE: 97 mg/dL (ref 65–99)
HCT: 44 % (ref 39.0–52.0)
Hemoglobin: 15 g/dL (ref 13.0–17.0)
POTASSIUM: 3.9 mmol/L (ref 3.5–5.1)
Sodium: 136 mmol/L (ref 135–145)
TCO2: 27 mmol/L (ref 0–100)

## 2016-04-17 LAB — CBC WITH DIFFERENTIAL/PLATELET
Basophils Absolute: 0 10*3/uL (ref 0.0–0.1)
Basophils Relative: 0 %
EOS PCT: 2 %
Eosinophils Absolute: 0.2 10*3/uL (ref 0.0–0.7)
HCT: 43.4 % (ref 39.0–52.0)
Hemoglobin: 14.5 g/dL (ref 13.0–17.0)
LYMPHS ABS: 2 10*3/uL (ref 0.7–4.0)
LYMPHS PCT: 19 %
MCH: 33.3 pg (ref 26.0–34.0)
MCHC: 33.4 g/dL (ref 30.0–36.0)
MCV: 99.8 fL (ref 78.0–100.0)
MONO ABS: 1.2 10*3/uL — AB (ref 0.1–1.0)
MONOS PCT: 12 %
Neutro Abs: 6.8 10*3/uL (ref 1.7–7.7)
Neutrophils Relative %: 67 %
PLATELETS: 129 10*3/uL — AB (ref 150–400)
RBC: 4.35 MIL/uL (ref 4.22–5.81)
RDW: 15.1 % (ref 11.5–15.5)
WBC: 10.3 10*3/uL (ref 4.0–10.5)

## 2016-04-17 LAB — URINALYSIS, ROUTINE W REFLEX MICROSCOPIC
Bilirubin Urine: NEGATIVE
Glucose, UA: NEGATIVE mg/dL
HGB URINE DIPSTICK: NEGATIVE
Ketones, ur: NEGATIVE mg/dL
NITRITE: NEGATIVE
Protein, ur: NEGATIVE mg/dL
SPECIFIC GRAVITY, URINE: 1.02 (ref 1.005–1.030)
pH: 5.5 (ref 5.0–8.0)

## 2016-04-17 LAB — GLUCOSE, CAPILLARY: Glucose-Capillary: 118 mg/dL — ABNORMAL HIGH (ref 65–99)

## 2016-04-17 LAB — URINE MICROSCOPIC-ADD ON

## 2016-04-17 MED ORDER — ONDANSETRON 4 MG PO TBDP
4.0000 mg | ORAL_TABLET | Freq: Once | ORAL | Status: AC
  Administered 2016-04-17: 4 mg via ORAL
  Filled 2016-04-17: qty 1

## 2016-04-17 MED ORDER — ACETAMINOPHEN 325 MG PO TABS
650.0000 mg | ORAL_TABLET | Freq: Four times a day (QID) | ORAL | Status: DC | PRN
  Administered 2016-04-18: 650 mg via ORAL
  Filled 2016-04-17: qty 2

## 2016-04-17 MED ORDER — ENOXAPARIN SODIUM 40 MG/0.4ML ~~LOC~~ SOLN
40.0000 mg | SUBCUTANEOUS | Status: DC
  Administered 2016-04-18 – 2016-04-19 (×2): 40 mg via SUBCUTANEOUS
  Filled 2016-04-17 (×2): qty 0.4

## 2016-04-17 MED ORDER — ASPIRIN EC 81 MG PO TBEC
81.0000 mg | DELAYED_RELEASE_TABLET | Freq: Every day | ORAL | Status: DC
  Administered 2016-04-18 – 2016-04-19 (×2): 81 mg via ORAL
  Filled 2016-04-17 (×2): qty 1

## 2016-04-17 MED ORDER — ATENOLOL 25 MG PO TABS
50.0000 mg | ORAL_TABLET | Freq: Every day | ORAL | Status: DC
  Administered 2016-04-18 – 2016-04-19 (×2): 50 mg via ORAL
  Filled 2016-04-17 (×2): qty 2

## 2016-04-17 MED ORDER — KETOROLAC TROMETHAMINE 30 MG/ML IJ SOLN
15.0000 mg | Freq: Once | INTRAMUSCULAR | Status: AC
  Administered 2016-04-17: 15 mg via INTRAVENOUS
  Filled 2016-04-17: qty 1

## 2016-04-17 MED ORDER — SODIUM CHLORIDE 0.9 % IV SOLN
INTRAVENOUS | Status: DC
  Administered 2016-04-18: 21:00:00 via INTRAVENOUS

## 2016-04-17 MED ORDER — CIPROFLOXACIN IN D5W 400 MG/200ML IV SOLN
400.0000 mg | Freq: Once | INTRAVENOUS | Status: AC
  Administered 2016-04-17: 400 mg via INTRAVENOUS
  Filled 2016-04-17: qty 200

## 2016-04-17 MED ORDER — ONDANSETRON HCL 4 MG/2ML IJ SOLN: 4.0000 mg | Freq: Four times a day (QID) | INTRAMUSCULAR | Status: DC | PRN

## 2016-04-17 MED ORDER — HYDROMORPHONE HCL 1 MG/ML IJ SOLN
0.5000 mg | Freq: Once | INTRAMUSCULAR | Status: AC
  Administered 2016-04-17: 0.5 mg via INTRAVENOUS
  Filled 2016-04-17: qty 1

## 2016-04-17 MED ORDER — ONDANSETRON HCL 4 MG/2ML IJ SOLN
4.0000 mg | Freq: Once | INTRAMUSCULAR | Status: AC
  Administered 2016-04-17: 4 mg via INTRAVENOUS
  Filled 2016-04-17: qty 2

## 2016-04-17 MED ORDER — INSULIN GLARGINE 100 UNIT/ML ~~LOC~~ SOLN
56.0000 [IU] | Freq: Two times a day (BID) | SUBCUTANEOUS | Status: DC
  Administered 2016-04-18 – 2016-04-19 (×2): 56 [IU] via SUBCUTANEOUS
  Filled 2016-04-17 (×6): qty 0.56

## 2016-04-17 MED ORDER — OMEGA-3-ACID ETHYL ESTERS 1 G PO CAPS
1000.0000 mg | ORAL_CAPSULE | Freq: Every day | ORAL | Status: DC
  Administered 2016-04-18 – 2016-04-19 (×2): 1000 mg via ORAL
  Filled 2016-04-17 (×4): qty 1

## 2016-04-17 MED ORDER — HYDROMORPHONE HCL 1 MG/ML IJ SOLN
1.0000 mg | Freq: Once | INTRAMUSCULAR | Status: AC
  Administered 2016-04-17: 1 mg via INTRAMUSCULAR
  Filled 2016-04-17: qty 1

## 2016-04-17 MED ORDER — BISACODYL 10 MG RE SUPP: 10.0000 mg | Freq: Every day | RECTAL | Status: DC | PRN

## 2016-04-17 MED ORDER — ACETAMINOPHEN 650 MG RE SUPP: 650.0000 mg | Freq: Four times a day (QID) | RECTAL | Status: DC | PRN

## 2016-04-17 MED ORDER — HYDROCODONE-ACETAMINOPHEN 5-325 MG PO TABS: 1.0000 | ORAL_TABLET | Freq: Four times a day (QID) | ORAL | Status: DC | PRN

## 2016-04-17 MED ORDER — INSULIN ASPART 100 UNIT/ML ~~LOC~~ SOLN
0.0000 [IU] | Freq: Three times a day (TID) | SUBCUTANEOUS | Status: DC
  Administered 2016-04-18: 2 [IU] via SUBCUTANEOUS
  Administered 2016-04-19: 3 [IU] via SUBCUTANEOUS

## 2016-04-17 MED ORDER — HYDROCHLOROTHIAZIDE 25 MG PO TABS
25.0000 mg | ORAL_TABLET | Freq: Every day | ORAL | Status: DC
  Administered 2016-04-18 – 2016-04-19 (×2): 25 mg via ORAL
  Filled 2016-04-17 (×2): qty 1

## 2016-04-17 MED ORDER — LOSARTAN POTASSIUM 50 MG PO TABS
50.0000 mg | ORAL_TABLET | Freq: Every day | ORAL | Status: DC
  Administered 2016-04-18 – 2016-04-19 (×2): 50 mg via ORAL
  Filled 2016-04-17 (×2): qty 1

## 2016-04-17 MED ORDER — ROSUVASTATIN CALCIUM 10 MG PO TABS
10.0000 mg | ORAL_TABLET | Freq: Every day | ORAL | Status: DC
  Administered 2016-04-19: 10 mg via ORAL
  Filled 2016-04-17 (×2): qty 1

## 2016-04-17 MED ORDER — CIPROFLOXACIN HCL 500 MG PO TABS: 500.0000 mg | ORAL_TABLET | Freq: Two times a day (BID) | ORAL | Status: DC

## 2016-04-17 MED ORDER — ACETAMINOPHEN 500 MG PO TABS: 500.0000 mg | ORAL_TABLET | Freq: Four times a day (QID) | ORAL | Status: DC | PRN

## 2016-04-17 MED ORDER — POLYETHYLENE GLYCOL 3350 17 G PO PACK
17.0000 g | PACK | Freq: Every day | ORAL | Status: DC
  Administered 2016-04-18 – 2016-04-19 (×2): 17 g via ORAL
  Filled 2016-04-17 (×2): qty 1

## 2016-04-17 MED ORDER — ONDANSETRON HCL 4 MG PO TABS
4.0000 mg | ORAL_TABLET | Freq: Four times a day (QID) | ORAL | Status: DC | PRN
  Administered 2016-04-18: 4 mg via ORAL
  Filled 2016-04-17: qty 1

## 2016-04-17 MED ORDER — AMLODIPINE BESYLATE 5 MG PO TABS
5.0000 mg | ORAL_TABLET | Freq: Every day | ORAL | Status: DC
  Administered 2016-04-18 – 2016-04-19 (×2): 5 mg via ORAL
  Filled 2016-04-17 (×2): qty 1

## 2016-04-17 MED ORDER — HYDROMORPHONE HCL 1 MG/ML IJ SOLN
1.0000 mg | INTRAMUSCULAR | Status: DC | PRN
  Administered 2016-04-19 (×2): 1 mg via INTRAVENOUS
  Filled 2016-04-17 (×2): qty 1

## 2016-04-17 MED ORDER — INSULIN ASPART 100 UNIT/ML ~~LOC~~ SOLN
12.0000 [IU] | Freq: Three times a day (TID) | SUBCUTANEOUS | Status: DC
  Administered 2016-04-18 – 2016-04-19 (×2): 12 [IU] via SUBCUTANEOUS

## 2016-04-17 MED ORDER — CIPROFLOXACIN IN D5W 400 MG/200ML IV SOLN
400.0000 mg | Freq: Two times a day (BID) | INTRAVENOUS | Status: DC
  Administered 2016-04-18 – 2016-04-19 (×3): 400 mg via INTRAVENOUS
  Filled 2016-04-17 (×3): qty 200

## 2016-04-17 NOTE — ED Notes (Signed)
Pt placed on 2liters [place] for comfort. nad noted. 

## 2016-04-17 NOTE — Discharge Instructions (Signed)
Follow up with your md next week. °

## 2016-04-17 NOTE — ED Notes (Signed)
Bladder scan done, 0mL assessed, pt had been incont. Of urine as well.

## 2016-04-17 NOTE — H&P (Signed)
TRH H&P   Patient Demographics:    Alan Briggs, is a 82 y.o. male  MRN: [id number]   DOB - 1922/05/04  Admit Date - 04/17/2016  Outpatient Primary MD for the patient is [name], MD  Referring MD/NP/PA: Dr Estell Harpin  Patient coming from: Home  Chief Complaint  Patient presents with  . Flank Pain      HPI:    Unknown Alan Briggs  is a 82 y.o. male, With history of diabetes mellitus, hypertension, kidney stones who came to the hospital with abdominal pain. Patient has a history of kidney stones in the past. CT stone protocol was done in the ED which showed mild fullness of the left intrarenal collecting system and ureter with mild periureteric edema. No evidence of left renal ureteral or bladder stones. It also showed 3.8 centimeter round lesion in the pancreatic tail, which may represent a saccular aneurysm of the splenic artery or a primary pancreatic cyst or neoplasm. UA was negative for nitrate, showed WBCs.    Review of systems:    In addition to the HPI above No Fever-chills, No Headache, No changes with Vision or hearing, No problems swallowing food or Liquids, No Chest pain, Cough or Shortness of Breath, No Nausea vomiting No Blood in stool or Urine, + Dysuria  No new skin rashes or bruises, No new joints pains-aches,  No new weakness, tingling, numbness in any extremity, No recent weight gain or loss, No polyuria, polydypsia or polyphagia, No significant Mental Stressors.  A full 10 point Review of Systems was done, except as stated above, all other Review of Systems were negative.   With Past History of the following :    Past Medical History  Diagnosis Date  . Diabetes (HCC)   . Cardiac pacemaker in situ     for sick sinus syndrome-last placement was 09/2007 [facility]  . Mixed hyperlipidemia   . Venous insufficiency (chronic) (peripheral)   . Thrombocytopenia (HCC)   . Sleep  apnea   . Pulmonary hypertension (HCC)   . Obstructive lung disease (HCC)     PFT's done 04/22/06   . GERD (gastroesophageal reflux disease)       Past Surgical History  Procedure Laterality Date  . Refractive surgery Right   . Cataract extraction, bilateral Bilateral   . Rotator cuff repair Left   . Kidney stone surgery Left     laser ablation       Social History:     Social History  Substance Use Topics  . Smoking status: Former Smoker -- 0.50 packs/day for 30 years    Types: Cigarettes    Start date: 10/18/1948    Quit date: 10/18/1978  . Smokeless tobacco: Never Used  . Alcohol Use: Not on file       Family History :     Family History  Problem Relation Age of Onset  . Diabetes Mother       Home Medications:   Prior to Admission medications   Medication Sig  Start Date End Date Taking? Authorizing Provider  acetaminophen (TYLENOL) 500 MG tablet Take 500 mg by mouth every 6 (six) hours as needed.   Yes Historical Provider, MD  amLODipine (NORVASC) 5 MG tablet Take 5 mg by mouth daily.   Yes Historical Provider, MD  aspirin EC 81 MG tablet Take 1 tablet by mouth daily.   Yes Historical Provider, MD  atenolol (TENORMIN) 50 MG tablet Take 1 tablet by mouth daily. 05/24/12  Yes Historical Provider, MD  carboxymethylcellulose (REFRESH PLUS) 0.5 % SOLN Place 1 drop into both eyes 3 (three) times daily as needed.   Yes Historical Provider, MD  furosemide (LASIX) 20 MG tablet Take 1 tablet (20 mg total) by mouth daily as needed. 01/01/16  Yes Antoine Poche, MD  hydrochlorothiazide (HYDRODIURIL) 25 MG tablet Take 1 tablet by mouth daily. 10/31/11  Yes Historical Provider, MD  insulin glargine (LANTUS) 100 UNIT/ML injection Inject 56 Units into the skin 2 (two) times daily.  03/22/14  Yes Historical Provider, MD  insulin lispro (HUMALOG) 100 UNIT/ML injection Inject 12 Units into the skin 3 (three) times daily before meals.    Yes Historical Provider, MD  losartan (COZAAR)  50 MG tablet Take 1 tablet by mouth daily. 05/01/12  Yes Historical Provider, MD  Multiple Vitamins-Minerals (ICAPS PO) Take 1 capsule by mouth daily.   Yes Historical Provider, MD  Multiple Vitamins-Minerals (MULTIVITAMIN WITH MINERALS) tablet Take 1 tablet by mouth daily.   Yes Historical Provider, MD  Omega-3 1000 MG CAPS Take 1 g by mouth daily.    Yes Historical Provider, MD  rosuvastatin (CRESTOR) 10 MG tablet Take 10 mg by mouth daily.   Yes Historical Provider, MD  ciprofloxacin (CIPRO) 500 MG tablet Take 1 tablet (500 mg total) by mouth 2 (two) times daily. One po bid x 7 days 04/17/16   Bethann Berkshire, MD  HYDROcodone-acetaminophen (NORCO/VICODIN) 5-325 MG tablet Take 1 tablet by mouth every 6 (six) hours as needed. 04/17/16   Bethann Berkshire, MD     Allergies:     Allergies  Allergen Reactions  . Advair Diskus [Fluticasone-Salmeterol]     Told by MD not to use  . Combivent [Ipratropium-Albuterol]     Told by MD not to use  . Lotensin Hct [Benazepril-Hydrochlorothiazide]     Told by MD not to use  . Penicillins Swelling  . Simvastatin Other (See Comments)    myalgias     Physical Exam:   Vitals  Blood pressure 121/55, pulse 60, temperature 98 F (36.7 C), temperature source Oral, resp. rate 20, height 6' (1.829 m), weight 136.079 kg (300 lb), SpO2 97 %.   1. General Caucasian male  lying in bed in NAD, cooperative* with exam  2. Normal affect and insight, Not Suicidal or Homicidal, Awake Alert, Oriented X 3.  3. No F.N deficits, ALL C.Nerves Intact, Strength 5/5 all 4 extremities, Sensation intact all 4 extremities, Plantars down going.  4. Ears and Eyes appear Normal, Conjunctivae clear, PERRLA. Moist Oral Mucosa.  5. Supple Neck, No JVD, No cervical lymphadenopathy appriciated, No Carotid Bruits.  6. Symmetrical Chest wall movement, Good air movement bilaterally, CTAB.  7. RRR, No Gallops, Rubs or Murmurs, No Parasternal Heave.  8. Positive Bowel Sounds, Abdomen  Soft, No tenderness, No organomegaly appriciated,No rebound -guarding or rigidity.  9.  No Cyanosis, Normal Skin Turgor, No Skin Rash or Bruise.  10. Good muscle tone,  joints appear normal , no effusions, Normal ROM.  Data Review:    CBC  Recent Labs Lab 04/17/16 1651 04/17/16 1724  WBC 10.3  --   HGB 14.5 15.0  HCT 43.4 44.0  PLT 129*  --   MCV 99.8  --   MCH 33.3  --   MCHC 33.4  --   RDW 15.1  --   LYMPHSABS 2.0  --   MONOABS 1.2*  --   EOSABS 0.2  --   BASOSABS 0.0  --    ------------------------------------------------------------------------------------------------------------------  Chemistries   Recent Labs Lab 04/17/16 1724  NA 136  K 3.9  CL 95*  GLUCOSE 97  BUN 22*  CREATININE 1.00   -------------------------------------------------------------------------------------------------------------------  Cardiac Enzymes No results for input(s): CKMB, TROPONINI, MYOGLOBIN in the last 168 hours.  Invalid input(s): CK ------------------------------------------------------------------------------------------------------------------ No results found for: BNP   ---------------------------------------------------------------------------------------------------------------  Urinalysis    Component Value Date/Time   COLORURINE YELLOW 04/17/2016 1716   APPEARANCEUR CLEAR 04/17/2016 1716   LABSPEC 1.020 04/17/2016 1716   PHURINE 5.5 04/17/2016 1716   GLUCOSEU NEGATIVE 04/17/2016 1716   HGBUR NEGATIVE 04/17/2016 1716   BILIRUBINUR NEGATIVE 04/17/2016 1716   KETONESUR NEGATIVE 04/17/2016 1716   PROTEINUR NEGATIVE 04/17/2016 1716   NITRITE NEGATIVE 04/17/2016 1716   LEUKOCYTESUR MODERATE* 04/17/2016 1716    ----------------------------------------------------------------------------------------------------------------   Imaging Results:    Ct Renal Stone Study  04/17/2016  CLINICAL DATA:  Bladder pain all day similar to kidney stone pain patient  has had previously. EXAM: CT ABDOMEN AND PELVIS WITHOUT CONTRAST TECHNIQUE: Multidetector CT imaging of the abdomen and pelvis was performed following the standard protocol without IV contrast. COMPARISON:  None. FINDINGS: Lower chest:  Bibasilar atelectasis. Hepatobiliary: 2.2 cm water density lesion inferior right liver is likely a cyst. Liver otherwise has normal uninfused appearance. There is no evidence for gallstones, gallbladder wall thickening, or pericholecystic fluid. No intrahepatic or extrahepatic biliary dilation. Pancreas: 3.7 x 3.8 cm masslike lesion is identified in the region of the pancreatic tail. No pancreatic ductal dilatation. Spleen: Calcified granulomata. Adrenals/Urinary Tract: Thickening of the adrenal glands without discrete nodules. No stones are seen in the right kidney. Exophytic 2.8 cm water density lesion in the interpolar right kidney is likely a cyst. No right hydroureteronephrosis. No right renal stone. Vascular calcification is noted in the hilum of the left kidney without left renal stone evident. There is mild fullness of the left intrarenal collecting system and left ureter and there appears to be mild left periureteric edema. No evidence for UVJ or bladder stone. Stomach/Bowel: Stomach is nondistended. No gastric wall thickening. No evidence of outlet obstruction. Small duodenum diverticulum noted. No small bowel wall thickening. No small bowel dilatation. The terminal ileum is normal. The appendix is not visualized, but there is no edema or inflammation in the region of the cecum. The cecum is high in the right abdomen. Colon is unremarkable aside from an elongated sigmoid segment it tracks over into the right lower quadrant. No evidence for diverticulitis. Vascular/Lymphatic: There is abdominal aortic atherosclerosis without aneurysm. 3.8 cm rounded lesion in the region of the pancreatic tail may represent splenic artery aneurysm. There is no gastrohepatic or hepatoduodenal  ligament lymphadenopathy. No intraperitoneal or retroperitoneal lymphadenopathy. No pelvic sidewall lymphadenopathy. Reproductive: The prostate gland and seminal vesicles have normal imaging features. Other: No intraperitoneal free fluid. Musculoskeletal: Bone windows reveal no worrisome lytic or sclerotic osseous lesions. IMPRESSION: 1. 3.8 cm round lesion in the pancreatic tail. This may represent a saccular aneurysm of the splenic artery or a  primary pancreatic cyst or neoplasm. Pancreatic protocol CT of the abdomen is recommended to further evaluate. 2. Mild fullness of the left intrarenal collecting system and ureter with mild periureteric edema. No evidence for left renal, ureteral, or bladder stones. Secondary changes in the left kidney near may be related to recent stone passage, infection, or neoplasm. 3. Probable cyst interpolar right kidney. Imaging findings of potential clinical significance: Aortic Atherosclerosis (ICD10-170.0) Electronically Signed   By: Kennith CenterEric  Mansell M.D.   On: 04/17/2016 18:54       Assessment & Plan:    Active Problems:   Pacemaker   Chronic diastolic heart failure (HCC)   Abdominal pain   UTI (lower urinary tract infection)     1. Abdominal pain- improved, likely from passage of ureteral stone. Continue Dilaudid when necessary for pain 2. ? UTI- patient has mildly abnormal UA, started empirically on ciprofloxacin. Continue with antibiotics. Follow urine culture results 3. Pancreatic lesion- CT scan shows 3.8 cm lesion in the pancreatic tail, discussed with patient's son on phone. Will need to follow-up with PCP for further evaluation as outpatient. 4. Diabetes mellitus- start sliding scale insulin with NovoLog, continue with Lantus 56 units twice a day, Humalog 12 units twice a day before meals.  DVT Prophylaxis-   Lovenox   AM Labs Ordered, also please review Full Orders  Family Communication: Admission, patients condition and plan of care including tests  being ordered have been discussed with the patient and his son and daughter in law at bedside who indicate understanding and agree with the plan and Code Status.  Code Status:  DNR  Admission status: Observation  Time spent in minutes : 55 min   Mira Balon S M.D on 04/17/2016 at 11:08 PM  Between 7am to 7pm - Pager - [phone]. After 7pm go to [email or web address] - password [email or web address]  [facility] - Office  [phone]

## 2016-04-17 NOTE — ED Notes (Signed)
Lab at bedside

## 2016-04-17 NOTE — ED Notes (Signed)
Pt states his bladder has been hurting all day.  States it hurts like a kidney stone before, but pt states he has not urinated much.

## 2016-04-18 DIAGNOSIS — K869 Disease of pancreas, unspecified: Secondary | ICD-10-CM | POA: Diagnosis not present

## 2016-04-18 DIAGNOSIS — E782 Mixed hyperlipidemia: Secondary | ICD-10-CM | POA: Diagnosis present

## 2016-04-18 DIAGNOSIS — R109 Unspecified abdominal pain: Secondary | ICD-10-CM | POA: Diagnosis not present

## 2016-04-18 DIAGNOSIS — Z9841 Cataract extraction status, right eye: Secondary | ICD-10-CM | POA: Diagnosis not present

## 2016-04-18 DIAGNOSIS — N390 Urinary tract infection, site not specified: Secondary | ICD-10-CM | POA: Diagnosis not present

## 2016-04-18 DIAGNOSIS — Z66 Do not resuscitate: Secondary | ICD-10-CM | POA: Diagnosis present

## 2016-04-18 DIAGNOSIS — I110 Hypertensive heart disease with heart failure: Secondary | ICD-10-CM | POA: Diagnosis present

## 2016-04-18 DIAGNOSIS — I272 Other secondary pulmonary hypertension: Secondary | ICD-10-CM | POA: Diagnosis present

## 2016-04-18 DIAGNOSIS — J449 Chronic obstructive pulmonary disease, unspecified: Secondary | ICD-10-CM | POA: Diagnosis present

## 2016-04-18 DIAGNOSIS — R1033 Periumbilical pain: Secondary | ICD-10-CM | POA: Diagnosis not present

## 2016-04-18 DIAGNOSIS — Z87442 Personal history of urinary calculi: Secondary | ICD-10-CM | POA: Diagnosis not present

## 2016-04-18 DIAGNOSIS — G4730 Sleep apnea, unspecified: Secondary | ICD-10-CM | POA: Diagnosis present

## 2016-04-18 DIAGNOSIS — Z79899 Other long term (current) drug therapy: Secondary | ICD-10-CM | POA: Diagnosis not present

## 2016-04-18 DIAGNOSIS — Z7982 Long term (current) use of aspirin: Secondary | ICD-10-CM | POA: Diagnosis not present

## 2016-04-18 DIAGNOSIS — Z9842 Cataract extraction status, left eye: Secondary | ICD-10-CM | POA: Diagnosis not present

## 2016-04-18 DIAGNOSIS — K219 Gastro-esophageal reflux disease without esophagitis: Secondary | ICD-10-CM | POA: Diagnosis present

## 2016-04-18 DIAGNOSIS — Z87891 Personal history of nicotine dependence: Secondary | ICD-10-CM | POA: Diagnosis not present

## 2016-04-18 DIAGNOSIS — K5900 Constipation, unspecified: Secondary | ICD-10-CM | POA: Diagnosis present

## 2016-04-18 DIAGNOSIS — Z794 Long term (current) use of insulin: Secondary | ICD-10-CM | POA: Diagnosis not present

## 2016-04-18 DIAGNOSIS — Z950 Presence of cardiac pacemaker: Secondary | ICD-10-CM | POA: Diagnosis not present

## 2016-04-18 DIAGNOSIS — I5032 Chronic diastolic (congestive) heart failure: Secondary | ICD-10-CM | POA: Diagnosis not present

## 2016-04-18 DIAGNOSIS — E119 Type 2 diabetes mellitus without complications: Secondary | ICD-10-CM | POA: Diagnosis present

## 2016-04-18 LAB — GLUCOSE, CAPILLARY
GLUCOSE-CAPILLARY: 173 mg/dL — AB (ref 65–99)
GLUCOSE-CAPILLARY: 102 mg/dL — AB (ref 65–99)
GLUCOSE-CAPILLARY: 86 mg/dL (ref 65–99)
Glucose-Capillary: 114 mg/dL — ABNORMAL HIGH (ref 65–99)
Glucose-Capillary: 144 mg/dL — ABNORMAL HIGH (ref 65–99)

## 2016-04-18 LAB — COMPREHENSIVE METABOLIC PANEL
ALT: 24 U/L (ref 17–63)
ANION GAP: 8 (ref 5–15)
AST: 29 U/L (ref 15–41)
Albumin: 3.3 g/dL — ABNORMAL LOW (ref 3.5–5.0)
Alkaline Phosphatase: 50 U/L (ref 38–126)
BILIRUBIN TOTAL: 0.9 mg/dL (ref 0.3–1.2)
BUN: 27 mg/dL — ABNORMAL HIGH (ref 6–20)
CALCIUM: 8.3 mg/dL — AB (ref 8.9–10.3)
CHLORIDE: 96 mmol/L — AB (ref 101–111)
CO2: 30 mmol/L (ref 22–32)
CREATININE: 1.21 mg/dL (ref 0.61–1.24)
GFR, EST AFRICAN AMERICAN: 58 mL/min — AB (ref >60)
GFR, EST NON AFRICAN AMERICAN: 50 mL/min — AB (ref >60)
GLUCOSE: 121 mg/dL — AB (ref 65–99)
Potassium: 4.2 mmol/L (ref 3.5–5.1)
Sodium: 134 mmol/L — ABNORMAL LOW (ref 135–145)
Total Protein: 6.2 g/dL — ABNORMAL LOW (ref 6.5–8.1)

## 2016-04-18 LAB — CBC
HEMATOCRIT: 43.1 % (ref 39.0–52.0)
Hemoglobin: 13.9 g/dL (ref 13.0–17.0)
MCH: 32.9 pg (ref 26.0–34.0)
MCHC: 32.3 g/dL (ref 30.0–36.0)
MCV: 102.1 fL — AB (ref 78.0–100.0)
PLATELETS: 117 10*3/uL — AB (ref 150–400)
RBC: 4.22 MIL/uL (ref 4.22–5.81)
RDW: 15.5 % (ref 11.5–15.5)
WBC: 9.1 10*3/uL (ref 4.0–10.5)

## 2016-04-18 LAB — CREATININE, SERUM
CREATININE: 0.99 mg/dL (ref 0.61–1.24)
GFR calc Af Amer: >60 mL/min (ref >60)
GFR calc non Af Amer: >60 mL/min (ref >60)

## 2016-04-18 MED ORDER — FLEET ENEMA 7-19 GM/118ML RE ENEM
1.0000 | ENEMA | Freq: Once | RECTAL | Status: AC
  Administered 2016-04-18: 1 via RECTAL

## 2016-04-18 MED ORDER — FAMOTIDINE 20 MG PO TABS
40.0000 mg | ORAL_TABLET | Freq: Once | ORAL | Status: AC
  Administered 2016-04-18: 40 mg via ORAL
  Filled 2016-04-18: qty 2

## 2016-04-18 NOTE — Progress Notes (Signed)
PROGRESS NOTE    Alan Briggs  WGN:[id number] DOB: October 04, 1922 DOA: 04/17/2016 PCP: [name], MD     Brief Narrative:  82 year old man admitted on 7/1 from home with complaints of suprapubic and left flank abdominal pain. He has a history of renal stones, had a CT scan that showed fullness of the left intrarenal collecting system and ureter with edema which may be related to recent stone passage, infection or neoplasm. There was also a 3.8 cm lesion of the pancreatic tail and follow-up imaging is recommended to further evaluate. Because of continued pain patient was admitted for further evaluation and management.   Assessment & Plan:   Active Problems:   Pacemaker   Chronic diastolic heart failure (HCC)   Abdominal pain   UTI (lower urinary tract infection)   Pancreatic lesion   Left flank/abdominal pain -Findings most likely represent a recently passed stone, I do not believe he has a urine infection but will elect to continue antibiotic for 3 days given his symptoms. -He also complains of significant constipation and this is a chronic problem for him which may be exacerbating his abdominal pain. Will augment bowel regimen.  Lesion of the pancreatic tail -Will need follow-up imaging to further evaluate.  Generalized weakness -Will request PT evaluation. Will likely need home health services to be arranged, patient does not want to go to SNF yet although all 3 sons believe that this is what he needs.     DVT prophylaxis: Lovenox Code Status: DO NOT RESUSCITATE Family Communication: Discussed with 3 sons at bedside, updated on plan of care and all questions answered Disposition Plan: To be determined, likely home in 24 hours  Consultants:   None  Procedures:   None  Antimicrobials:   Cipro    Subjective: Complains of continued pain, he believes this is secondary to constipation  Objective: Filed Vitals:   04/17/16 2324 04/18/16 0633 04/18/16 0833 04/18/16 1355   BP: 111/41 120/62  108/66  Pulse: 77 64 80 60  Temp: 97.6 F (36.4 C) 98.1 F (36.7 C)  98.9 F (37.2 C)  TempSrc: Oral Oral  Oral  Resp:  20  20  Height: 6' (1.829 m)     Weight: 137.5 kg (303 lb 2.1 oz)     SpO2: 92% 93%  95%    Intake/Output Summary (Last 24 hours) at 04/18/16 1624 Last data filed at 04/18/16 7846  Gross per 24 hour  Intake  67.83 ml  Output      0 ml  Net  67.83 ml   Filed Weights   04/17/16 1623 04/17/16 2324  Weight: 136.079 kg (300 lb) 137.5 kg (303 lb 2.1 oz)    Examination:  General exam: Alert, awake, oriented x 3 Respiratory system: Clear to auscultation. Respiratory effort normal. Cardiovascular system:RRR. No murmurs, rubs, gallops. Gastrointestinal system: Abdomen is nondistended, soft and nontender. No organomegaly or masses felt. Normal bowel sounds heard. Central nervous system: Alert and oriented. No focal neurological deficits. Extremities: No C/C/E, +pedal pulses Skin: No rashes, lesions or ulcers Psychiatry: Judgement and insight appear normal. Mood & affect appropriate.     Data Reviewed: I have personally reviewed following labs and imaging studies  CBC:  Recent Labs Lab 04/17/16 1651 04/17/16 1724 04/18/16 0602  WBC 10.3  --  9.1  NEUTROABS 6.8  --   --   HGB 14.5 15.0 13.9  HCT 43.4 44.0 43.1  MCV 99.8  --  102.1*  PLT 129*  --  117*   Basic Metabolic Panel:  Recent Labs Lab 04/17/16 1650 04/17/16 1724 04/18/16 0602  NA  --  136 134*  K  --  3.9 4.2  CL  --  95* 96*  CO2  --   --  30  GLUCOSE  --  97 121*  BUN  --  22* 27*  CREATININE 0.99 1.00 1.21  CALCIUM  --   --  8.3*   GFR: Estimated Creatinine Clearance: 56 mL/min (by C-G formula based on Cr of 1.21). Liver Function Tests:  Recent Labs Lab 04/18/16 0602  AST 29  ALT 24  ALKPHOS 50  BILITOT 0.9  PROT 6.2*  ALBUMIN 3.3*   No results for input(s): LIPASE, AMYLASE in the last 168 hours. No results for input(s): AMMONIA in the last 168  hours. Coagulation Profile: No results for input(s): INR, PROTIME in the last 168 hours. Cardiac Enzymes: No results for input(s): CKTOTAL, CKMB, CKMBINDEX, TROPONINI in the last 168 hours. BNP (last 3 results) No results for input(s): PROBNP in the last 8760 hours. HbA1C: No results for input(s): HGBA1C in the last 72 hours. CBG:  Recent Labs Lab 04/17/16 2357 04/18/16 0716 04/18/16 1049  GLUCAP 118* 114* 173*   Lipid Profile: No results for input(s): CHOL, HDL, LDLCALC, TRIG, CHOLHDL, LDLDIRECT in the last 72 hours. Thyroid Function Tests: No results for input(s): TSH, T4TOTAL, FREET4, T3FREE, THYROIDAB in the last 72 hours. Anemia Panel: No results for input(s): VITAMINB12, FOLATE, FERRITIN, TIBC, IRON, RETICCTPCT in the last 72 hours. Urine analysis:    Component Value Date/Time   COLORURINE YELLOW 04/17/2016 1716   APPEARANCEUR CLEAR 04/17/2016 1716   LABSPEC 1.020 04/17/2016 1716   PHURINE 5.5 04/17/2016 1716   GLUCOSEU NEGATIVE 04/17/2016 1716   HGBUR NEGATIVE 04/17/2016 1716   BILIRUBINUR NEGATIVE 04/17/2016 1716   KETONESUR NEGATIVE 04/17/2016 1716   PROTEINUR NEGATIVE 04/17/2016 1716   NITRITE NEGATIVE 04/17/2016 1716   LEUKOCYTESUR MODERATE* 04/17/2016 1716   Sepsis Labs: @LABRCNTIP (procalcitonin:4,lacticidven:4)  )No results found for this or any previous visit (from the past 240 hour(s)).       Radiology Studies: Ct Renal Stone Study  04/17/2016  CLINICAL DATA:  Bladder pain all day similar to kidney stone pain patient has had previously. EXAM: CT ABDOMEN AND PELVIS WITHOUT CONTRAST TECHNIQUE: Multidetector CT imaging of the abdomen and pelvis was performed following the standard protocol without IV contrast. COMPARISON:  None. FINDINGS: Lower chest:  Bibasilar atelectasis. Hepatobiliary: 2.2 cm water density lesion inferior right liver is likely a cyst. Liver otherwise has normal uninfused appearance. There is no evidence for gallstones, gallbladder wall  thickening, or pericholecystic fluid. No intrahepatic or extrahepatic biliary dilation. Pancreas: 3.7 x 3.8 cm masslike lesion is identified in the region of the pancreatic tail. No pancreatic ductal dilatation. Spleen: Calcified granulomata. Adrenals/Urinary Tract: Thickening of the adrenal glands without discrete nodules. No stones are seen in the right kidney. Exophytic 2.8 cm water density lesion in the interpolar right kidney is likely a cyst. No right hydroureteronephrosis. No right renal stone. Vascular calcification is noted in the hilum of the left kidney without left renal stone evident. There is mild fullness of the left intrarenal collecting system and left ureter and there appears to be mild left periureteric edema. No evidence for UVJ or bladder stone. Stomach/Bowel: Stomach is nondistended. No gastric wall thickening. No evidence of outlet obstruction. Small duodenum diverticulum noted. No small bowel wall thickening. No small bowel dilatation. The terminal ileum is normal. The  appendix is not visualized, but there is no edema or inflammation in the region of the cecum. The cecum is high in the right abdomen. Colon is unremarkable aside from an elongated sigmoid segment it tracks over into the right lower quadrant. No evidence for diverticulitis. Vascular/Lymphatic: There is abdominal aortic atherosclerosis without aneurysm. 3.8 cm rounded lesion in the region of the pancreatic tail may represent splenic artery aneurysm. There is no gastrohepatic or hepatoduodenal ligament lymphadenopathy. No intraperitoneal or retroperitoneal lymphadenopathy. No pelvic sidewall lymphadenopathy. Reproductive: The prostate gland and seminal vesicles have normal imaging features. Other: No intraperitoneal free fluid. Musculoskeletal: Bone windows reveal no worrisome lytic or sclerotic osseous lesions. IMPRESSION: 1. 3.8 cm round lesion in the pancreatic tail. This may represent a saccular aneurysm of the splenic artery  or a primary pancreatic cyst or neoplasm. Pancreatic protocol CT of the abdomen is recommended to further evaluate. 2. Mild fullness of the left intrarenal collecting system and ureter with mild periureteric edema. No evidence for left renal, ureteral, or bladder stones. Secondary changes in the left kidney near may be related to recent stone passage, infection, or neoplasm. 3. Probable cyst interpolar right kidney. Imaging findings of potential clinical significance: Aortic Atherosclerosis (ICD10-170.0) Electronically Signed   By: Kennith CenterEric  Mansell M.D.   On: 04/17/2016 18:54        Scheduled Meds: . amLODipine  5 mg Oral Daily  . aspirin EC  81 mg Oral Daily  . atenolol  50 mg Oral Daily  . ciprofloxacin  400 mg Intravenous Q12H  . enoxaparin (LOVENOX) injection  40 mg Subcutaneous Q24H  . hydrochlorothiazide  25 mg Oral Daily  . insulin aspart  0-9 Units Subcutaneous TID WC  . insulin aspart  12 Units Subcutaneous TID WC  . insulin glargine  56 Units Subcutaneous BID  . losartan  50 mg Oral Daily  . omega-3 acid ethyl esters  1,000 mg Oral Daily  . polyethylene glycol  17 g Oral Daily  . rosuvastatin  10 mg Oral Daily   Continuous Infusions: . sodium chloride 10 mL/hr at 04/17/16 2346        Time spent: 25 minutes. Greater than 50% of this time was spent in direct contact with the patient coordinating care.     Chaya JanHERNANDEZ ACOSTA,ESTELA, MD [facility] Pager [phone]  If 7PM-7AM, please contact night-coverage [email or web address] Password [email or web address] 04/18/2016, 4:24 PM

## 2016-04-18 NOTE — Progress Notes (Addendum)
CM spoke with patient and family regarding home health services.  Patient may need home health services once discharged.  Doctor notified.

## 2016-04-18 NOTE — ED Provider Notes (Signed)
CSN: [id number]     Arrival date & time 04/17/16  1618 History   First MD Initiated Contact with Patient 04/17/16 1627     Chief Complaint  Patient presents with  . Flank Pain     (Consider location/radiation/quality/duration/timing/severity/associated sxs/prior Treatment) Patient is a 82 y.o. male presenting with flank pain. The history is provided by the patient (patient complains of left flank pain pain going into his left groin).  Flank Pain This is a new problem. The current episode started 6 to 12 hours ago. The problem occurs constantly. The problem has not changed since onset.Associated symptoms include abdominal pain. Pertinent negatives include no chest pain and no headaches. Nothing aggravates the symptoms. Nothing relieves the symptoms.    Past Medical History  Diagnosis Date  . Diabetes (HCC)   . Cardiac pacemaker in situ     for sick sinus syndrome-last placement was 09/2007 [facility]  . Mixed hyperlipidemia   . Venous insufficiency (chronic) (peripheral)   . Thrombocytopenia (HCC)   . Sleep apnea   . Pulmonary hypertension (HCC)   . Obstructive lung disease (HCC)     PFT's done 04/22/06   . GERD (gastroesophageal reflux disease)    Past Surgical History  Procedure Laterality Date  . Refractive surgery Right   . Cataract extraction, bilateral Bilateral   . Rotator cuff repair Left   . Kidney stone surgery Left     laser ablation    Family History  Problem Relation Age of Onset  . Diabetes Mother    Social History  Substance Use Topics  . Smoking status: Former Smoker -- 0.50 packs/day for 30 years    Types: Cigarettes    Start date: 10/18/1948    Quit date: 10/18/1978  . Smokeless tobacco: Never Used  . Alcohol Use: None    Review of Systems  Constitutional: Negative for appetite change and fatigue.  HENT: Negative for congestion, ear discharge and sinus pressure.   Eyes: Negative for discharge.  Respiratory: Negative for cough.    Cardiovascular: Negative for chest pain.  Gastrointestinal: Positive for abdominal pain. Negative for diarrhea.  Genitourinary: Positive for flank pain. Negative for frequency and hematuria.  Musculoskeletal: Negative for back pain.  Skin: Negative for rash.  Neurological: Negative for seizures and headaches.  Psychiatric/Behavioral: Negative for hallucinations.      Allergies  Advair diskus; Combivent; Lotensin hct; Penicillins; and Simvastatin  Home Medications   Prior to Admission medications   Medication Sig Start Date End Date Taking? Authorizing Provider  acetaminophen (TYLENOL) 500 MG tablet Take 500 mg by mouth every 6 (six) hours as needed.   Yes Historical Provider, MD  amLODipine (NORVASC) 5 MG tablet Take 5 mg by mouth daily.   Yes Historical Provider, MD  aspirin EC 81 MG tablet Take 1 tablet by mouth daily.   Yes Historical Provider, MD  atenolol (TENORMIN) 50 MG tablet Take 1 tablet by mouth daily. 05/24/12  Yes Historical Provider, MD  carboxymethylcellulose (REFRESH PLUS) 0.5 % SOLN Place 1 drop into both eyes 3 (three) times daily as needed.   Yes Historical Provider, MD  furosemide (LASIX) 20 MG tablet Take 1 tablet (20 mg total) by mouth daily as needed. 01/01/16  Yes Antoine Poche, MD  hydrochlorothiazide (HYDRODIURIL) 25 MG tablet Take 1 tablet by mouth daily. 10/31/11  Yes Historical Provider, MD  insulin glargine (LANTUS) 100 UNIT/ML injection Inject 56 Units into the skin 2 (two) times daily.  03/22/14  Yes Historical Provider, MD  insulin lispro (HUMALOG) 100 UNIT/ML injection Inject 12 Units into the skin 3 (three) times daily before meals.    Yes Historical Provider, MD  losartan (COZAAR) 50 MG tablet Take 1 tablet by mouth daily. 05/01/12  Yes Historical Provider, MD  Multiple Vitamins-Minerals (ICAPS PO) Take 1 capsule by mouth daily.   Yes Historical Provider, MD  Multiple Vitamins-Minerals (MULTIVITAMIN WITH MINERALS) tablet Take 1 tablet by mouth daily.    Yes Historical Provider, MD  Omega-3 1000 MG CAPS Take 1 g by mouth daily.    Yes Historical Provider, MD  rosuvastatin (CRESTOR) 10 MG tablet Take 10 mg by mouth daily.   Yes Historical Provider, MD  ciprofloxacin (CIPRO) 500 MG tablet Take 1 tablet (500 mg total) by mouth 2 (two) times daily. One po bid x 7 days 04/17/16   Bethann BerkshireJoseph Tenee Wish, MD  HYDROcodone-acetaminophen (NORCO/VICODIN) 5-325 MG tablet Take 1 tablet by mouth every 6 (six) hours as needed. 04/17/16   Bethann BerkshireJoseph Arias Weinert, MD   BP 120/62 mmHg  Pulse 80  Temp(Src) 98.1 F (36.7 C) (Oral)  Resp 20  Ht 6' (1.829 m)  Wt 303 lb 2.1 oz (137.5 kg)  BMI 41.10 kg/m2  SpO2 93% Physical Exam  Constitutional: He is oriented to person, place, and time. He appears well-developed.  HENT:  Head: Normocephalic.  Eyes: Conjunctivae and EOM are normal. No scleral icterus.  Neck: Neck supple. No thyromegaly present.  Cardiovascular: Normal rate and regular rhythm.  Exam reveals no gallop and no friction rub.   No murmur heard. Pulmonary/Chest: No stridor. He has no wheezes. He has no rales. He exhibits no tenderness.  Abdominal: He exhibits no distension. There is tenderness. There is no rebound.  Moderate suprapubic tenderness and left groin tenderness  Musculoskeletal: Normal range of motion. He exhibits no edema.  Lymphadenopathy:    He has no cervical adenopathy.  Neurological: He is oriented to person, place, and time. He exhibits normal muscle tone. Coordination normal.  Skin: No rash noted. No erythema.  Psychiatric: He has a normal mood and affect. His behavior is normal.    ED Course  Procedures (including critical care time) Labs Review Labs Reviewed  CBC WITH DIFFERENTIAL/PLATELET - Abnormal; Notable for the following:    Platelets 129 (*)    Monocytes Absolute 1.2 (*)    All other components within normal limits  URINALYSIS, ROUTINE W REFLEX MICROSCOPIC (NOT AT [facility]) - Abnormal; Notable for the following:    Leukocytes, UA  MODERATE (*)    All other components within normal limits  URINE MICROSCOPIC-ADD ON - Abnormal; Notable for the following:    Squamous Epithelial / LPF 0-5 (*)    Bacteria, UA FEW (*)    All other components within normal limits  CBC - Abnormal; Notable for the following:    MCV 102.1 (*)    Platelets 117 (*)    All other components within normal limits  COMPREHENSIVE METABOLIC PANEL - Abnormal; Notable for the following:    Sodium 134 (*)    Chloride 96 (*)    Glucose, Bld 121 (*)    BUN 27 (*)    Calcium 8.3 (*)    Total Protein 6.2 (*)    Albumin 3.3 (*)    GFR calc non Af Amer 50 (*)    GFR calc Af Amer 58 (*)    All other components within normal limits  GLUCOSE, CAPILLARY - Abnormal; Notable for the following:    Glucose-Capillary 118 (*)  All other components within normal limits  GLUCOSE, CAPILLARY - Abnormal; Notable for the following:    Glucose-Capillary 114 (*)    All other components within normal limits  GLUCOSE, CAPILLARY - Abnormal; Notable for the following:    Glucose-Capillary 173 (*)    All other components within normal limits  I-STAT CHEM 8, ED - Abnormal; Notable for the following:    Chloride 95 (*)    BUN 22 (*)    All other components within normal limits  URINE CULTURE  CREATININE, SERUM  HEMOGLOBIN A1C    Imaging Review Ct Renal Stone Study  04/17/2016  CLINICAL DATA:  Bladder pain all day similar to kidney stone pain patient has had previously. EXAM: CT ABDOMEN AND PELVIS WITHOUT CONTRAST TECHNIQUE: Multidetector CT imaging of the abdomen and pelvis was performed following the standard protocol without IV contrast. COMPARISON:  None. FINDINGS: Lower chest:  Bibasilar atelectasis. Hepatobiliary: 2.2 cm water density lesion inferior right liver is likely a cyst. Liver otherwise has normal uninfused appearance. There is no evidence for gallstones, gallbladder wall thickening, or pericholecystic fluid. No intrahepatic or extrahepatic biliary  dilation. Pancreas: 3.7 x 3.8 cm masslike lesion is identified in the region of the pancreatic tail. No pancreatic ductal dilatation. Spleen: Calcified granulomata. Adrenals/Urinary Tract: Thickening of the adrenal glands without discrete nodules. No stones are seen in the right kidney. Exophytic 2.8 cm water density lesion in the interpolar right kidney is likely a cyst. No right hydroureteronephrosis. No right renal stone. Vascular calcification is noted in the hilum of the left kidney without left renal stone evident. There is mild fullness of the left intrarenal collecting system and left ureter and there appears to be mild left periureteric edema. No evidence for UVJ or bladder stone. Stomach/Bowel: Stomach is nondistended. No gastric wall thickening. No evidence of outlet obstruction. Small duodenum diverticulum noted. No small bowel wall thickening. No small bowel dilatation. The terminal ileum is normal. The appendix is not visualized, but there is no edema or inflammation in the region of the cecum. The cecum is high in the right abdomen. Colon is unremarkable aside from an elongated sigmoid segment it tracks over into the right lower quadrant. No evidence for diverticulitis. Vascular/Lymphatic: There is abdominal aortic atherosclerosis without aneurysm. 3.8 cm rounded lesion in the region of the pancreatic tail may represent splenic artery aneurysm. There is no gastrohepatic or hepatoduodenal ligament lymphadenopathy. No intraperitoneal or retroperitoneal lymphadenopathy. No pelvic sidewall lymphadenopathy. Reproductive: The prostate gland and seminal vesicles have normal imaging features. Other: No intraperitoneal free fluid. Musculoskeletal: Bone windows reveal no worrisome lytic or sclerotic osseous lesions. IMPRESSION: 1. 3.8 cm round lesion in the pancreatic tail. This may represent a saccular aneurysm of the splenic artery or a primary pancreatic cyst or neoplasm. Pancreatic protocol CT of the  abdomen is recommended to further evaluate. 2. Mild fullness of the left intrarenal collecting system and ureter with mild periureteric edema. No evidence for left renal, ureteral, or bladder stones. Secondary changes in the left kidney near may be related to recent stone passage, infection, or neoplasm. 3. Probable cyst interpolar right kidney. Imaging findings of potential clinical significance: Aortic Atherosclerosis (ICD10-170.0) Electronically Signed   By: Kennith CenterEric  Mansell M.D.   On: 04/17/2016 18:54   I have personally reviewed and evaluated these images and lab results as part of my medical decision-making.   EKG Interpretation None      MDM   Final diagnoses:  Pain  UTI (lower urinary tract infection)  CT shows possible passed stone on the left. Urinalysis shows possible urinary tract infection. Patient will be admitted to the hospital for pain control and antibiotics. He also has a lesion in his pancreas that needs further evaluation    Bethann Berkshire, MD 04/18/16 1121

## 2016-04-18 NOTE — Progress Notes (Signed)
Pt c/o nausea, vomited x2. Zofran had been given at 2315. Pt c/o feeling constipated, requested Fleets enema. MD paged and Fleets enema given rectally. Pt passed flatus and fleets returned. Pt also c/o indigestion, MD paged and orders given for PO Pepcid.

## 2016-04-18 NOTE — Care Management Obs Status (Signed)
MEDICARE OBSERVATION STATUS NOTIFICATION   Patient Details  Name: Alan CollumRobert C Shimel MRN: [id number] Date of Birth: 1922-09-27   Medicare Observation Status Notification Given:  Yes    Fuller PlanWelborn, Khiya Friese M, RN 04/18/2016, 11:21 AM

## 2016-04-19 DIAGNOSIS — I5032 Chronic diastolic (congestive) heart failure: Secondary | ICD-10-CM

## 2016-04-19 LAB — HEMOGLOBIN A1C
Hgb A1c MFr Bld: 5.5 % (ref 4.8–5.6)
MEAN PLASMA GLUCOSE: 111 mg/dL

## 2016-04-19 LAB — URINE CULTURE: Special Requests: NORMAL

## 2016-04-19 LAB — GLUCOSE, CAPILLARY
GLUCOSE-CAPILLARY: 206 mg/dL — AB (ref 65–99)
Glucose-Capillary: 250 mg/dL — ABNORMAL HIGH (ref 65–99)

## 2016-04-19 MED ORDER — POLYETHYLENE GLYCOL 3350 17 G PO PACK: 17.0000 g | PACK | Freq: Every day | ORAL | Status: DC

## 2016-04-19 MED ORDER — CIPROFLOXACIN HCL 250 MG PO TABS: 250.0000 mg | ORAL_TABLET | Freq: Two times a day (BID) | ORAL | Status: DC

## 2016-04-19 MED ORDER — DOCUSATE SODIUM 100 MG PO CAPS: 100.0000 mg | ORAL_CAPSULE | Freq: Two times a day (BID) | ORAL | Status: DC

## 2016-04-19 NOTE — Evaluation (Signed)
Physical Therapy Evaluation Patient Details Name: Alan CollumRobert C Briggs MRN: [id number] DOB: Feb 11, 1922 Today's Date: 04/19/2016   History of Present Illness  Alan PoissonRobert Briggs is a 82yo white male [facility] acute severe ABD pain. PMH includes DM, HTN, kidney stones, and son Alan EndoKelly reports that he has had SaO2 impairments ~5Y but below the threshold for O2 at home.   Clinical Impression  Upon entry, the patient is received semirecumbent in bed, no son present. The pt is awake and agreeable to participate despite feeling poorly. No acute distress noted at this time. HR assessment reveals patient to be with irregular HR, but in 60's at rest and 90 after activity. The pt is alert and oriented x3, pleasant, conversational, and following simple and multi-step commands consistently. Pt received on and remaining on 2L O2 throughout evaluation, doffed for gait trial, with noted desaturation of 81% and poor recovery until O2 is returned, whereas pt is not on O2 at baseline at home. Functional mobility assessment demonstrates moderate weakness, the pt now requiring near maximal effort and additional time for bed mobility and transfers, and gait is limited in distance, whereas the patient performs these with modified independence at baseline. Patient presenting with impairment of strength, balance, and activity tolerance, limiting ability to perform ADL and mobility tasks at  baseline level of function. Patient will benefit from skilled intervention to address the above impairments and limitations, in order to restore to prior level of function, improve patient safety upon discharge, and to decrease falls risk.       Follow Up Recommendations SNF    Equipment Recommendations  None recommended by PT    Recommendations for Other Services       Precautions / Restrictions Precautions Precautions: None Restrictions Weight Bearing Restrictions: No      Mobility  Bed Mobility Overal bed mobility: Needs  Assistance Bed Mobility: Supine to Sit     Supine to sit: Supervision;HOB elevated     General bed mobility comments: requires more effort/time than baseline, but has a bed at home with elevating HOB.   Transfers Overall transfer level: Needs assistance Equipment used: Rolling walker (2 wheeled) Transfers: Sit to/from Stand Sit to Stand: Min guard         General transfer comment: requires rocking, elevated surface, and maximal effot.   Ambulation/Gait Ambulation/Gait assistance: Min guard Ambulation Distance (Feet): 75 Feet Assistive device: Rolling walker (2 wheeled)       General Gait Details: slow and steady, asks to return to room halfway, as his legs are beginning to give out. His SaO2 is 82% on RA upon return with poor recovery at rest on RA,  Stairs            Wheelchair Mobility    Modified Rankin (Stroke Patients Only)       Balance Overall balance assessment: Needs assistance Sitting-balance support: Feet supported Sitting balance-Leahy Scale: Fair     Standing balance support: During functional activity;Bilateral upper extremity supported Standing balance-Leahy Scale: Fair                               Pertinent Vitals/Pain Pain Assessment: No/denies pain    Home Living Family/patient expects to be discharged to:: Private residence Living Arrangements: Alone Available Help at Discharge: Family Type of Home: House Home Access: Stairs to enter Entrance Stairs-Rails: Left Entrance Stairs-Number of Steps: 4-5 Home Layout: One level Home Equipment: Environmental consultantWalker - 2  wheels;Cane - single point      Prior Function Level of Independence: Independent with assistive device(s)         Comments: PTA driving, preparing light meals, ate mostly outside of home, limited standing tolerance, and limited walking tolerance.      Hand Dominance        Extremity/Trunk Assessment                         Communication    Communication: No difficulties;HOH  Cognition Arousal/Alertness: Awake/alert Behavior During Therapy: WFL for tasks assessed/performed Overall Cognitive Status: Within Functional Limits for tasks assessed                      General Comments      Exercises        Assessment/Plan    PT Assessment Patient needs continued PT services  PT Diagnosis Difficulty walking;Generalized weakness   PT Problem List Decreased strength;Decreased activity tolerance;Decreased balance;Decreased mobility  PT Treatment Interventions Gait training;Stair training;Functional mobility training;Therapeutic activities;Therapeutic exercise;Balance training;Patient/family education   PT Goals (Current goals can be found in the Care Plan section) Acute Rehab PT Goals Patient Stated Goal: Return to home, regain strength.  PT Goal Formulation: With patient Time For Goal Achievement: 05/03/16 Potential to Achieve Goals: Good    Frequency Min 3X/week   Barriers to discharge Inaccessible home environment      Co-evaluation               End of Session Equipment Utilized During Treatment: Gait belt;Oxygen Activity Tolerance: Patient tolerated treatment well Patient left: in bed;with family/visitor present (seated EOB with son in room. ) Nurse Communication: Other (comment)         Time: 0900-0930 PT Time Calculation (min) (ACUTE ONLY): 30 min   Charges:   PT Evaluation $PT Eval Moderate Complexity: 1 Procedure PT Treatments $Therapeutic Activity: 8-22 mins   PT G Codes:        10:48 AM, 04/19/2016 Alan LintsAllan C Shadia Briggs, PT, DPT Physical Therapist - [place] [phone](ASCOM)  [phone] (mobile)

## 2016-04-19 NOTE — Discharge Summary (Signed)
Physician Discharge Summary  Alan CollumRobert C Briggs ZOX:[id number] DOB: [birth year] DOA: 04/17/2016  PCP: Alan MelenaZack Hall, MD  Admit date: 04/17/2016 Discharge date: 04/19/2016  Time spent: 45 minutes  Recommendations for Outpatient Follow-up:  -We'll be discharged home today. -Advised to follow-up with primary care provider in 2 weeks. -Home health services will be arranged prior to discharge. -We'll need follow-up CT scan to further evaluate pancreatic tail lesion. Sons are aware of this.   Discharge Diagnoses:  Active Problems:   Pacemaker   Chronic diastolic heart failure (HCC)   Abdominal pain   UTI (lower urinary tract infection)   Pancreatic lesion   Discharge Condition: Stable and improved  Filed Weights   04/17/16 1623 04/17/16 2324  Weight: 136.079 kg (300 lb) 137.5 kg (303 lb 2.1 oz)    History of present illness:  As per Dr. Sharl MaLama on 7/1: Alan Briggs is a 82 y.o. male, With history of diabetes mellitus, hypertension, kidney stones who came to the hospital with abdominal pain. Patient has a history of kidney stones in the past. CT stone protocol was done in the ED which showed mild fullness of the left intrarenal collecting system and ureter with mild periureteric edema. No evidence of left renal ureteral or bladder stones. It also showed 3.8 centimeter round lesion in the pancreatic tail, which may represent a saccular aneurysm of the splenic artery or a primary pancreatic cyst or neoplasm. UA was negative for nitrate, showed WBCs.  Hospital Course:   Left flank/abdominal pain -Findings most likely represent a recently passed stone, I do not believe he has a urine infection but will elect to continue antibiotic for 3 days given his symptoms. -He also complains of significant constipation and this is a chronic problem for him which may be exacerbating his abdominal pain. He has had good bowel movements: The hospital. Continue me relax and Colace upon discharge.  Lesion of the  pancreatic tail -Will need follow-up imaging to further evaluate.  Generalized weakness -PT recommended SNF, however because of finances patient and family decided to go home with home health services.  Procedures:  None   Consultations:  None  Discharge Instructions  Discharge Instructions    Diet - low sodium heart healthy    Complete by:  As directed      Increase activity slowly    Complete by:  As directed             Medication List    STOP taking these medications        furosemide 20 MG tablet  Commonly known as:  LASIX      TAKE these medications        acetaminophen 500 MG tablet  Commonly known as:  TYLENOL  Take 500 mg by mouth every 6 (six) hours as needed.     amLODipine 5 MG tablet  Commonly known as:  NORVASC  Take 5 mg by mouth daily.     aspirin EC 81 MG tablet  Take 1 tablet by mouth daily.     atenolol 50 MG tablet  Commonly known as:  TENORMIN  Take 1 tablet by mouth daily.     carboxymethylcellulose 0.5 % Soln  Commonly known as:  REFRESH PLUS  Place 1 drop into both eyes 3 (three) times daily as needed.     ciprofloxacin 250 MG tablet  Commonly known as:  CIPRO  Take 1 tablet (250 mg total) by mouth 2 (two) times daily.  docusate sodium 100 MG capsule  Commonly known as:  COLACE  Take 1 capsule (100 mg total) by mouth 2 (two) times daily.     hydrochlorothiazide 25 MG tablet  Commonly known as:  HYDRODIURIL  Take 1 tablet by mouth daily.     ICAPS PO  Take 1 capsule by mouth daily.     insulin lispro 100 UNIT/ML injection  Commonly known as:  HUMALOG  Inject 12 Units into the skin 3 (three) times daily before meals.     LANTUS 100 UNIT/ML injection  Generic drug:  insulin glargine  Inject 56 Units into the skin 2 (two) times daily.     losartan 50 MG tablet  Commonly known as:  COZAAR  Take 1 tablet by mouth daily.     Omega-3 1000 MG Caps  Take 1 g by mouth daily.     polyethylene glycol packet  Commonly  known as:  MIRALAX / GLYCOLAX  Take 17 g by mouth daily.     rosuvastatin 10 MG tablet  Commonly known as:  CRESTOR  Take 10 mg by mouth daily.       Allergies  Allergen Reactions  . Advair Diskus [Fluticasone-Salmeterol]     Told by MD not to use  . Combivent [Ipratropium-Albuterol]     Told by MD not to use  . Lotensin Hct [Benazepril-Hydrochlorothiazide]     Told by MD not to use  . Penicillins Swelling  . Simvastatin Other (See Comments)    myalgias       Follow-up Information    Follow up with Alan Melena, MD. Schedule an appointment as soon as possible for a visit in 2 weeks.   Specialty:  Internal Medicine   Contact information:   [address] [place] [address] [phone]        The results of significant diagnostics from this hospitalization (including imaging, microbiology, ancillary and laboratory) are listed below for reference.    Significant Diagnostic Studies: Ct Renal Stone Study  04/17/2016  CLINICAL DATA:  Bladder pain all day similar to kidney stone pain patient has had previously. EXAM: CT ABDOMEN AND PELVIS WITHOUT CONTRAST TECHNIQUE: Multidetector CT imaging of the abdomen and pelvis was performed following the standard protocol without IV contrast. COMPARISON:  None. FINDINGS: Lower chest:  Bibasilar atelectasis. Hepatobiliary: 2.2 cm water density lesion inferior right liver is likely a cyst. Liver otherwise has normal uninfused appearance. There is no evidence for gallstones, gallbladder wall thickening, or pericholecystic fluid. No intrahepatic or extrahepatic biliary dilation. Pancreas: 3.7 x 3.8 cm masslike lesion is identified in the region of the pancreatic tail. No pancreatic ductal dilatation. Spleen: Calcified granulomata. Adrenals/Urinary Tract: Thickening of the adrenal glands without discrete nodules. No stones are seen in the right kidney. Exophytic 2.8 cm water density lesion in the interpolar right kidney is likely a cyst. No right  hydroureteronephrosis. No right renal stone. Vascular calcification is noted in the hilum of the left kidney without left renal stone evident. There is mild fullness of the left intrarenal collecting system and left ureter and there appears to be mild left periureteric edema. No evidence for UVJ or bladder stone. Stomach/Bowel: Stomach is nondistended. No gastric wall thickening. No evidence of outlet obstruction. Small duodenum diverticulum noted. No small bowel wall thickening. No small bowel dilatation. The terminal ileum is normal. The appendix is not visualized, but there is no edema or inflammation in the region of the cecum. The cecum is high in the right  abdomen. Colon is unremarkable aside from an elongated sigmoid segment it tracks over into the right lower quadrant. No evidence for diverticulitis. Vascular/Lymphatic: There is abdominal aortic atherosclerosis without aneurysm. 3.8 cm rounded lesion in the region of the pancreatic tail may represent splenic artery aneurysm. There is no gastrohepatic or hepatoduodenal ligament lymphadenopathy. No intraperitoneal or retroperitoneal lymphadenopathy. No pelvic sidewall lymphadenopathy. Reproductive: The prostate gland and seminal vesicles have normal imaging features. Other: No intraperitoneal free fluid. Musculoskeletal: Bone windows reveal no worrisome lytic or sclerotic osseous lesions. IMPRESSION: 1. 3.8 cm round lesion in the pancreatic tail. This may represent a saccular aneurysm of the splenic artery or a primary pancreatic cyst or neoplasm. Pancreatic protocol CT of the abdomen is recommended to further evaluate. 2. Mild fullness of the left intrarenal collecting system and ureter with mild periureteric edema. No evidence for left renal, ureteral, or bladder stones. Secondary changes in the left kidney near may be related to recent stone passage, infection, or neoplasm. 3. Probable cyst interpolar right kidney. Imaging findings of potential clinical  significance: Aortic Atherosclerosis (ICD10-170.0) Electronically Signed   By: Kennith CenterEric  Mansell M.D.   On: 04/17/2016 18:54    Microbiology: Recent Results (from the past 240 hour(s))  Urine culture     Status: Abnormal   Collection Time: 04/17/16  5:16 PM  Result Value Ref Range Status   Specimen Description URINE, RANDOM  Final   Special Requests Normal  Final   Culture MULTIPLE SPECIES PRESENT, SUGGEST RECOLLECTION (A)  Final   Report Status 04/19/2016 FINAL  Final     Labs: Basic Metabolic Panel:  Recent Labs Lab 04/17/16 1650 04/17/16 1724 04/18/16 0602  NA  --  136 134*  K  --  3.9 4.2  CL  --  95* 96*  CO2  --   --  30  GLUCOSE  --  97 121*  BUN  --  22* 27*  CREATININE 0.99 1.00 1.21  CALCIUM  --   --  8.3*   Liver Function Tests:  Recent Labs Lab 04/18/16 0602  AST 29  ALT 24  ALKPHOS 50  BILITOT 0.9  PROT 6.2*  ALBUMIN 3.3*   No results for input(s): LIPASE, AMYLASE in the last 168 hours. No results for input(s): AMMONIA in the last 168 hours. CBC:  Recent Labs Lab 04/17/16 1651 04/17/16 1724 04/18/16 0602  WBC 10.3  --  9.1  NEUTROABS 6.8  --   --   HGB 14.5 15.0 13.9  HCT 43.4 44.0 43.1  MCV 99.8  --  102.1*  PLT 129*  --  117*   Cardiac Enzymes: No results for input(s): CKTOTAL, CKMB, CKMBINDEX, TROPONINI in the last 168 hours. BNP: BNP (last 3 results) No results for input(s): BNP in the last 8760 hours.  ProBNP (last 3 results) No results for input(s): PROBNP in the last 8760 hours.  CBG:  Recent Labs Lab 04/18/16 1659 04/18/16 2054 04/18/16 2222 04/19/16 0740 04/19/16 1130  GLUCAP 102* 86 144* 206* 250*       Signed:  Chaya JanHERNANDEZ ACOSTA,ESTELA  [facility] Pager: [phone] 04/19/2016, 1:15 PM

## 2016-04-19 NOTE — Care Management Note (Signed)
Case Management Note  Patient Details  Name: Alan Briggs MRN: [id number] Date of Birth: May 13, 1922  Subjective/Objective:   [Patient from home. Lots of family support available. Two sons at bedside. Patient has been referred by PT for SNF. Patient does not qualify for SNF.   Action/Plan: Patient has elected to go home with home health. He was offered choice. Chose [facility]. Alroy BailiffLinda Lothian [facility] notified and will obtain information from chart. Patient aware [facility] has 48 hours to make first visit. Patient will also require home oxygen at discharge due to dyspnea on exertion. Al[facility]Linda Lothian [facility] made aware.   Expected Discharge Date:        04/19/2016          Expected Discharge Plan:  Home w Home Health Services  In-House Referral:  NA  Discharge planning Services  CM Consult  Post Acute Care Choice:  NA Choice offered to:  NA, Patient  DME Arranged:  Oxygen DME Agency:  [facility]  HH Arranged:  [facility]  [facility]  Status of Service:  Completed, signed off  If discussed at Long Length of Stay Meetings, dates discussed:    Additional Comments:  Lauralynn Loeb, Chrystine OilerSharley Diane, RN 04/19/2016, 2:17 PM

## 2016-04-19 NOTE — Care Management Important Message (Signed)
Important Message  Patient Details  Name: Cora CollumRobert C Langenfeld MRN: [id number] Date of Birth: May 15, 1922   Medicare Important Message Given:  Yes    Meagan Spease, Chrystine OilerSharley Diane, RN 04/19/2016, 2:26 PM

## 2016-04-19 NOTE — Clinical Social Work Note (Signed)
Clinical Social Work Assessment  Patient Details  Name: Alan Briggs MRN: [id number] Date of Birth: 1922-03-04  Date of referral:  04/19/16               Reason for consult:  Discharge Planning                Permission sought to share information with:  Family Supports Permission granted to share information::  Yes, Verbal Permission Granted  Name::     Romain, Erion  Agency::     Relationship::  children  Contact Information:     Housing/Transportation Living arrangements for the past 2 months:  [place] of Information:  Patient, Adult Children Patient Interpreter Needed:  None Criminal Activity/Legal Involvement Pertinent to Current Situation/Hospitalization:  No - Comment as needed Significant Relationships:  Adult Children Lives with:  Self Do you feel safe going back to the place where you live?  Yes Need for family participation in patient care:  Yes (Comment)  Care giving concerns:  Pt's sons would prefer for pt to go to SNF at d/c for rehab.    Social Worker assessment / plan:  CSW met with pt and pt's sons, [place] and [place] at bedside. Pt d/c today. He lives alone and generally manages okay per family. Claiborne Billings lives next door. Pt still drives and picks up take out for most meals. PT evaluated pt and recommend SNF. CSW discussed that pt has not met 3 night stay and would be private pay at [facility].  Also discussed ALF as another option. Pt is over Medicaid income limit. Pt states he will return home. CSW discussed home health which pt and sons were very agreeable to.   Employment status:  Retired Forensic scientist:  Medicare PT Recommendations:  [place] / Referral to community resources:  [place]  Patient/Family's Response to care:  Pt states he will return home.   Patient/Family's Understanding of and Emotional Response to Diagnosis, Current Treatment, and Prognosis:  Pt and sons are aware of Medicare  coverage/criteria for SNF and choose to return home as pt is not interested in private pay. Pt d/c today.   Emotional Assessment Appearance:  Appears younger than stated age Attitude/Demeanor/Rapport:  Other (Cooperative) Affect (typically observed):  Appropriate Orientation:  Oriented to Self, Oriented to Place, Oriented to  Time, Oriented to Situation Alcohol / Substance use:  Not Applicable Psych involvement (Current and /or in the community):  No (Comment)  Discharge Needs  Concerns to be addressed:  Discharge Planning Concerns Readmission within the last 30 days:  No Current discharge risk:  Lives alone Barriers to Discharge:  No Barriers Identified   Salome Arnt, [place] 04/19/2016, 12:30 PM [phone]

## 2016-04-19 NOTE — Progress Notes (Addendum)
SATURATION QUALIFICATIONS: (This note is used to comply with regulatory documentation for home oxygen)  Patient Saturations on Room Air at Rest = *97%%  Patient Saturations on Room Air while Ambulating = 86*%  Patient Saturations on *2 Liters of oxygen while Ambulating = 97%  Please briefly explain why patient needs home oxygen: Pt O2 sat dropped with ambulation to 86% and HR increased to 146.

## 2016-04-19 NOTE — Progress Notes (Signed)
Discharge instructions read to patient and his family.  All verbalized understanding of all instructions.  Discharged to home with family.

## 2016-04-20 ENCOUNTER — Emergency Department (HOSPITAL_COMMUNITY): Payer: Medicare Other

## 2016-04-20 ENCOUNTER — Encounter (HOSPITAL_COMMUNITY)
Admission: EM | Disposition: A | Discharge status: Skilled Nursing Facility | Payer: Self-pay | Source: Home / Self Care | Attending: Internal Medicine

## 2016-04-20 ENCOUNTER — Inpatient Hospital Stay (HOSPITAL_COMMUNITY): Payer: Medicare Other

## 2016-04-20 ENCOUNTER — Encounter (HOSPITAL_COMMUNITY): Payer: Self-pay | Admitting: Emergency Medicine

## 2016-04-20 ENCOUNTER — Inpatient Hospital Stay (HOSPITAL_COMMUNITY): Payer: Medicare Other | Admitting: Anesthesiology

## 2016-04-20 ENCOUNTER — Inpatient Hospital Stay (HOSPITAL_COMMUNITY)
Admission: EM | Admit: 2016-04-20 | Discharge: 2016-04-30 | DRG: 982 | Disposition: A | Discharge status: Skilled Nursing Facility | Payer: Medicare Other | Attending: Internal Medicine | Admitting: Internal Medicine

## 2016-04-20 DIAGNOSIS — Z6841 Body Mass Index (BMI) 40.0 and over, adult: Secondary | ICD-10-CM | POA: Diagnosis not present

## 2016-04-20 DIAGNOSIS — S93134A Subluxation of interphalangeal joint of right lesser toe(s), initial encounter: Secondary | ICD-10-CM | POA: Diagnosis not present

## 2016-04-20 DIAGNOSIS — E782 Mixed hyperlipidemia: Secondary | ICD-10-CM | POA: Diagnosis present

## 2016-04-20 DIAGNOSIS — Z66 Do not resuscitate: Secondary | ICD-10-CM | POA: Diagnosis present

## 2016-04-20 DIAGNOSIS — N1330 Unspecified hydronephrosis: Secondary | ICD-10-CM | POA: Diagnosis not present

## 2016-04-20 DIAGNOSIS — Z0189 Encounter for other specified special examinations: Secondary | ICD-10-CM

## 2016-04-20 DIAGNOSIS — K5900 Constipation, unspecified: Secondary | ICD-10-CM | POA: Diagnosis not present

## 2016-04-20 DIAGNOSIS — S91311A Laceration without foreign body, right foot, initial encounter: Secondary | ICD-10-CM

## 2016-04-20 DIAGNOSIS — G4733 Obstructive sleep apnea (adult) (pediatric): Secondary | ICD-10-CM | POA: Diagnosis not present

## 2016-04-20 DIAGNOSIS — S93104A Unspecified dislocation of right toe(s), initial encounter: Secondary | ICD-10-CM | POA: Diagnosis not present

## 2016-04-20 DIAGNOSIS — R609 Edema, unspecified: Secondary | ICD-10-CM | POA: Diagnosis not present

## 2016-04-20 DIAGNOSIS — K869 Disease of pancreas, unspecified: Secondary | ICD-10-CM | POA: Diagnosis not present

## 2016-04-20 DIAGNOSIS — K862 Cyst of pancreas: Secondary | ICD-10-CM

## 2016-04-20 DIAGNOSIS — I872 Venous insufficiency (chronic) (peripheral): Secondary | ICD-10-CM | POA: Diagnosis present

## 2016-04-20 DIAGNOSIS — Z9181 History of falling: Secondary | ICD-10-CM | POA: Diagnosis not present

## 2016-04-20 DIAGNOSIS — S91114A Laceration without foreign body of right lesser toe(s) without damage to nail, initial encounter: Secondary | ICD-10-CM | POA: Diagnosis present

## 2016-04-20 DIAGNOSIS — E871 Hypo-osmolality and hyponatremia: Secondary | ICD-10-CM | POA: Diagnosis present

## 2016-04-20 DIAGNOSIS — R0902 Hypoxemia: Secondary | ICD-10-CM | POA: Diagnosis not present

## 2016-04-20 DIAGNOSIS — I5032 Chronic diastolic (congestive) heart failure: Secondary | ICD-10-CM | POA: Diagnosis not present

## 2016-04-20 DIAGNOSIS — R2689 Other abnormalities of gait and mobility: Secondary | ICD-10-CM | POA: Diagnosis not present

## 2016-04-20 DIAGNOSIS — E538 Deficiency of other specified B group vitamins: Secondary | ICD-10-CM | POA: Diagnosis present

## 2016-04-20 DIAGNOSIS — R339 Retention of urine, unspecified: Secondary | ICD-10-CM | POA: Diagnosis present

## 2016-04-20 DIAGNOSIS — S91319A Laceration without foreign body, unspecified foot, initial encounter: Secondary | ICD-10-CM | POA: Diagnosis present

## 2016-04-20 DIAGNOSIS — Z9981 Dependence on supplemental oxygen: Secondary | ICD-10-CM

## 2016-04-20 DIAGNOSIS — R579 Shock, unspecified: Secondary | ICD-10-CM | POA: Diagnosis not present

## 2016-04-20 DIAGNOSIS — Z79899 Other long term (current) drug therapy: Secondary | ICD-10-CM

## 2016-04-20 DIAGNOSIS — E11649 Type 2 diabetes mellitus with hypoglycemia without coma: Secondary | ICD-10-CM | POA: Diagnosis present

## 2016-04-20 DIAGNOSIS — Z950 Presence of cardiac pacemaker: Secondary | ICD-10-CM | POA: Diagnosis present

## 2016-04-20 DIAGNOSIS — K7689 Other specified diseases of liver: Secondary | ICD-10-CM | POA: Diagnosis not present

## 2016-04-20 DIAGNOSIS — R7881 Bacteremia: Secondary | ICD-10-CM

## 2016-04-20 DIAGNOSIS — Z5189 Encounter for other specified aftercare: Secondary | ICD-10-CM | POA: Diagnosis not present

## 2016-04-20 DIAGNOSIS — D7589 Other specified diseases of blood and blood-forming organs: Secondary | ICD-10-CM | POA: Diagnosis present

## 2016-04-20 DIAGNOSIS — E118 Type 2 diabetes mellitus with unspecified complications: Secondary | ICD-10-CM | POA: Diagnosis not present

## 2016-04-20 DIAGNOSIS — K560 Paralytic ileus: Secondary | ICD-10-CM | POA: Diagnosis not present

## 2016-04-20 DIAGNOSIS — E875 Hyperkalemia: Secondary | ICD-10-CM | POA: Diagnosis present

## 2016-04-20 DIAGNOSIS — E1142 Type 2 diabetes mellitus with diabetic polyneuropathy: Secondary | ICD-10-CM | POA: Diagnosis present

## 2016-04-20 DIAGNOSIS — J449 Chronic obstructive pulmonary disease, unspecified: Secondary | ICD-10-CM | POA: Diagnosis not present

## 2016-04-20 DIAGNOSIS — S91311S Laceration without foreign body, right foot, sequela: Secondary | ICD-10-CM | POA: Diagnosis not present

## 2016-04-20 DIAGNOSIS — J40 Bronchitis, not specified as acute or chronic: Secondary | ICD-10-CM | POA: Diagnosis not present

## 2016-04-20 DIAGNOSIS — R262 Difficulty in walking, not elsewhere classified: Secondary | ICD-10-CM | POA: Diagnosis not present

## 2016-04-20 DIAGNOSIS — Z880 Allergy status to penicillin: Secondary | ICD-10-CM | POA: Diagnosis not present

## 2016-04-20 DIAGNOSIS — Z87891 Personal history of nicotine dependence: Secondary | ICD-10-CM

## 2016-04-20 DIAGNOSIS — Z833 Family history of diabetes mellitus: Secondary | ICD-10-CM | POA: Diagnosis not present

## 2016-04-20 DIAGNOSIS — S93114A Dislocation of interphalangeal joint of right lesser toe(s), initial encounter: Secondary | ICD-10-CM | POA: Diagnosis present

## 2016-04-20 DIAGNOSIS — K6389 Other specified diseases of intestine: Secondary | ICD-10-CM | POA: Diagnosis not present

## 2016-04-20 DIAGNOSIS — I495 Sick sinus syndrome: Secondary | ICD-10-CM

## 2016-04-20 DIAGNOSIS — K219 Gastro-esophageal reflux disease without esophagitis: Secondary | ICD-10-CM | POA: Diagnosis present

## 2016-04-20 DIAGNOSIS — R531 Weakness: Secondary | ICD-10-CM | POA: Diagnosis not present

## 2016-04-20 DIAGNOSIS — S299XXA Unspecified injury of thorax, initial encounter: Secondary | ICD-10-CM | POA: Diagnosis not present

## 2016-04-20 DIAGNOSIS — K8689 Other specified diseases of pancreas: Secondary | ICD-10-CM

## 2016-04-20 DIAGNOSIS — R627 Adult failure to thrive: Secondary | ICD-10-CM | POA: Diagnosis present

## 2016-04-20 DIAGNOSIS — Z4789 Encounter for other orthopedic aftercare: Secondary | ICD-10-CM | POA: Diagnosis not present

## 2016-04-20 DIAGNOSIS — I272 Other secondary pulmonary hypertension: Secondary | ICD-10-CM | POA: Diagnosis present

## 2016-04-20 DIAGNOSIS — N390 Urinary tract infection, site not specified: Secondary | ICD-10-CM

## 2016-04-20 DIAGNOSIS — K913 Postprocedural intestinal obstruction: Secondary | ICD-10-CM | POA: Diagnosis not present

## 2016-04-20 DIAGNOSIS — S93104D Unspecified dislocation of right toe(s), subsequent encounter: Secondary | ICD-10-CM | POA: Diagnosis not present

## 2016-04-20 DIAGNOSIS — W19XXXA Unspecified fall, initial encounter: Secondary | ICD-10-CM | POA: Diagnosis present

## 2016-04-20 DIAGNOSIS — Z87442 Personal history of urinary calculi: Secondary | ICD-10-CM | POA: Diagnosis not present

## 2016-04-20 DIAGNOSIS — N179 Acute kidney failure, unspecified: Secondary | ICD-10-CM | POA: Diagnosis not present

## 2016-04-20 DIAGNOSIS — D62 Acute posthemorrhagic anemia: Secondary | ICD-10-CM | POA: Diagnosis not present

## 2016-04-20 DIAGNOSIS — S99921A Unspecified injury of right foot, initial encounter: Secondary | ICD-10-CM | POA: Diagnosis not present

## 2016-04-20 DIAGNOSIS — R279 Unspecified lack of coordination: Secondary | ICD-10-CM | POA: Diagnosis not present

## 2016-04-20 DIAGNOSIS — R259 Unspecified abnormal involuntary movements: Secondary | ICD-10-CM | POA: Diagnosis not present

## 2016-04-20 DIAGNOSIS — R293 Abnormal posture: Secondary | ICD-10-CM | POA: Diagnosis not present

## 2016-04-20 DIAGNOSIS — Z794 Long term (current) use of insulin: Secondary | ICD-10-CM | POA: Diagnosis not present

## 2016-04-20 DIAGNOSIS — Z7982 Long term (current) use of aspirin: Secondary | ICD-10-CM | POA: Diagnosis not present

## 2016-04-20 DIAGNOSIS — R404 Transient alteration of awareness: Secondary | ICD-10-CM | POA: Diagnosis not present

## 2016-04-20 DIAGNOSIS — K3189 Other diseases of stomach and duodenum: Secondary | ICD-10-CM

## 2016-04-20 DIAGNOSIS — R1312 Dysphagia, oropharyngeal phase: Secondary | ICD-10-CM | POA: Diagnosis not present

## 2016-04-20 DIAGNOSIS — R140 Abdominal distension (gaseous): Secondary | ICD-10-CM | POA: Diagnosis not present

## 2016-04-20 DIAGNOSIS — S92124A Nondisplaced fracture of body of right talus, initial encounter for closed fracture: Secondary | ICD-10-CM | POA: Diagnosis not present

## 2016-04-20 DIAGNOSIS — Z9119 Patient's noncompliance with other medical treatment and regimen: Secondary | ICD-10-CM

## 2016-04-20 DIAGNOSIS — K567 Ileus, unspecified: Secondary | ICD-10-CM | POA: Diagnosis not present

## 2016-04-20 DIAGNOSIS — Z4682 Encounter for fitting and adjustment of non-vascular catheter: Secondary | ICD-10-CM | POA: Diagnosis not present

## 2016-04-20 DIAGNOSIS — Z888 Allergy status to other drugs, medicaments and biological substances status: Secondary | ICD-10-CM

## 2016-04-20 DIAGNOSIS — S91311D Laceration without foreign body, right foot, subsequent encounter: Secondary | ICD-10-CM | POA: Diagnosis not present

## 2016-04-20 DIAGNOSIS — A419 Sepsis, unspecified organism: Secondary | ICD-10-CM | POA: Diagnosis not present

## 2016-04-20 DIAGNOSIS — M6281 Muscle weakness (generalized): Secondary | ICD-10-CM | POA: Diagnosis not present

## 2016-04-20 DIAGNOSIS — R110 Nausea: Secondary | ICD-10-CM

## 2016-04-20 DIAGNOSIS — D519 Vitamin B12 deficiency anemia, unspecified: Secondary | ICD-10-CM | POA: Diagnosis not present

## 2016-04-20 DIAGNOSIS — S91119A Laceration without foreign body of unspecified toe without damage to nail, initial encounter: Secondary | ICD-10-CM | POA: Diagnosis not present

## 2016-04-20 DIAGNOSIS — J438 Other emphysema: Secondary | ICD-10-CM

## 2016-04-20 HISTORY — PX: PERCUTANEOUS PINNING: SHX2209

## 2016-04-20 LAB — ECHOCARDIOGRAM COMPLETE
Area-P 1/2: 2.02 cm2
CHL CUP MV DEC (S): 243
CHL CUP STROKE VOLUME: 46 mL
CHL CUP TV REG PEAK VELOCITY: 286 cm/s
E decel time: 243 msec
EERAT: 11.59
FS: 19 % — AB (ref 28–44)
HEIGHTINCHES: 72 in
IV/PV OW: 1.19
LA vol A4C: 89.1 ml
LADIAMINDEX: 1.29 cm/m2
LASIZE: 36 mm
LEFT ATRIUM END SYS DIAM: 36 mm
LV PW d: 16.2 mm — AB (ref 0.6–1.1)
LV sys vol index: 13 mL/m2
LV sys vol: 36 mL (ref 21–61)
LVDIAVOL: 82 mL (ref 62–150)
LVDIAVOLIN: 29 mL/m2
LVEEAVG: 11.59
LVEEMED: 11.59
LVELAT: 8.16 cm/s
LVOT VTI: 23.7 cm
LVOT area: 4.91 cm2
LVOT diameter: 25 mm
LVOTPV: 104 cm/s
LVOTSV: 116 mL
MV pk E vel: 94.6 m/s
MVPG: 4 mmHg
MVPKAVEL: 87.5 m/s
P 1/2 time: 109 ms
RV TAPSE: 19.1 mm
Simpson's disk: 56
TDI e' lateral: 8.16
TDI e' medial: 5.98
TR max vel: 286 cm/s
WEIGHTICAEL: 5139.36 [oz_av]

## 2016-04-20 LAB — URINE MICROSCOPIC-ADD ON

## 2016-04-20 LAB — URINALYSIS, ROUTINE W REFLEX MICROSCOPIC
Bilirubin Urine: NEGATIVE
GLUCOSE, UA: NEGATIVE mg/dL
KETONES UR: NEGATIVE mg/dL
NITRITE: NEGATIVE
PROTEIN: NEGATIVE mg/dL
Specific Gravity, Urine: 1.025 (ref 1.005–1.030)
pH: 5 (ref 5.0–8.0)

## 2016-04-20 LAB — CBC WITH DIFFERENTIAL/PLATELET
Basophils Absolute: 0 10*3/uL (ref 0.0–0.1)
Basophils Relative: 0 %
EOS PCT: 0 %
Eosinophils Absolute: 0 10*3/uL (ref 0.0–0.7)
HCT: 39.6 % (ref 39.0–52.0)
Hemoglobin: 13.2 g/dL (ref 13.0–17.0)
LYMPHS ABS: 0.8 10*3/uL (ref 0.7–4.0)
LYMPHS PCT: 5 %
MCH: 33.6 pg (ref 26.0–34.0)
MCHC: 33.3 g/dL (ref 30.0–36.0)
MCV: 100.8 fL — AB (ref 78.0–100.0)
MONO ABS: 2.3 10*3/uL — AB (ref 0.1–1.0)
MONOS PCT: 14 %
Neutro Abs: 13.7 10*3/uL — ABNORMAL HIGH (ref 1.7–7.7)
Neutrophils Relative %: 81 %
PLATELETS: 142 10*3/uL — AB (ref 150–400)
RBC: 3.93 MIL/uL — ABNORMAL LOW (ref 4.22–5.81)
RDW: 15.7 % — AB (ref 11.5–15.5)
WBC: 16.9 10*3/uL — ABNORMAL HIGH (ref 4.0–10.5)

## 2016-04-20 LAB — CBC
HEMATOCRIT: 38.9 % — AB (ref 39.0–52.0)
HEMOGLOBIN: 12.5 g/dL — AB (ref 13.0–17.0)
MCH: 32.6 pg (ref 26.0–34.0)
MCHC: 32.1 g/dL (ref 30.0–36.0)
MCV: 101.6 fL — AB (ref 78.0–100.0)
PLATELETS: 124 10*3/uL — AB (ref 150–400)
RBC: 3.83 MIL/uL — AB (ref 4.22–5.81)
RDW: 16 % — ABNORMAL HIGH (ref 11.5–15.5)
WBC: 13.6 10*3/uL — AB (ref 4.0–10.5)

## 2016-04-20 LAB — COMPREHENSIVE METABOLIC PANEL
ALK PHOS: 45 U/L (ref 38–126)
ALK PHOS: 40 U/L (ref 38–126)
ALT: 23 U/L (ref 17–63)
ALT: 21 U/L (ref 17–63)
AST: 29 U/L (ref 15–41)
AST: 34 U/L (ref 15–41)
Albumin: 3 g/dL — ABNORMAL LOW (ref 3.5–5.0)
Albumin: 2.6 g/dL — ABNORMAL LOW (ref 3.5–5.0)
Anion gap: 9 (ref 5–15)
Anion gap: 9 (ref 5–15)
BILIRUBIN TOTAL: 1 mg/dL (ref 0.3–1.2)
BILIRUBIN TOTAL: 0.7 mg/dL (ref 0.3–1.2)
BUN: 66 mg/dL — AB (ref 6–20)
BUN: 57 mg/dL — AB (ref 6–20)
CALCIUM: 7.7 mg/dL — AB (ref 8.9–10.3)
CHLORIDE: 98 mmol/L — AB (ref 101–111)
CO2: 27 mmol/L (ref 22–32)
CO2: 26 mmol/L (ref 22–32)
CREATININE: 3.93 mg/dL — AB (ref 0.61–1.24)
CREATININE: 3.25 mg/dL — AB (ref 0.61–1.24)
Calcium: 7.8 mg/dL — ABNORMAL LOW (ref 8.9–10.3)
Chloride: 94 mmol/L — ABNORMAL LOW (ref 101–111)
GFR calc Af Amer: 14 mL/min — ABNORMAL LOW (ref >60)
GFR, EST AFRICAN AMERICAN: 18 mL/min — AB (ref >60)
GFR, EST NON AFRICAN AMERICAN: 12 mL/min — AB (ref >60)
GFR, EST NON AFRICAN AMERICAN: 15 mL/min — AB (ref >60)
GLUCOSE: 60 mg/dL — AB (ref 65–99)
Glucose, Bld: 83 mg/dL (ref 65–99)
POTASSIUM: 5 mmol/L (ref 3.5–5.1)
Potassium: 4.6 mmol/L (ref 3.5–5.1)
Sodium: 130 mmol/L — ABNORMAL LOW (ref 135–145)
Sodium: 133 mmol/L — ABNORMAL LOW (ref 135–145)
Total Protein: 6.4 g/dL — ABNORMAL LOW (ref 6.5–8.1)
Total Protein: 5.5 g/dL — ABNORMAL LOW (ref 6.5–8.1)

## 2016-04-20 LAB — CBG MONITORING, ED
GLUCOSE-CAPILLARY: 56 mg/dL — AB (ref 65–99)
Glucose-Capillary: 79 mg/dL (ref 65–99)

## 2016-04-20 LAB — GLUCOSE, CAPILLARY
GLUCOSE-CAPILLARY: 80 mg/dL (ref 65–99)
GLUCOSE-CAPILLARY: 65 mg/dL (ref 65–99)
GLUCOSE-CAPILLARY: 118 mg/dL — AB (ref 65–99)
Glucose-Capillary: 107 mg/dL — ABNORMAL HIGH (ref 65–99)
Glucose-Capillary: 114 mg/dL — ABNORMAL HIGH (ref 65–99)

## 2016-04-20 LAB — BRAIN NATRIURETIC PEPTIDE: B NATRIURETIC PEPTIDE 5: 254 pg/mL — AB (ref 0.0–100.0)

## 2016-04-20 LAB — I-STAT CG4 LACTIC ACID, ED: Lactic Acid, Venous: 2.74 mmol/L (ref 0.5–1.9)

## 2016-04-20 LAB — MRSA PCR SCREENING: MRSA by PCR: NEGATIVE

## 2016-04-20 LAB — CK: CK TOTAL: 368 U/L (ref 49–397)

## 2016-04-20 LAB — TROPONIN I: Troponin I: <0.03 ng/mL (ref <0.03)

## 2016-04-20 LAB — LACTIC ACID, PLASMA
LACTIC ACID, VENOUS: 1.1 mmol/L (ref 0.5–1.9)
Lactic Acid, Venous: 1.4 mmol/L (ref 0.5–1.9)

## 2016-04-20 SURGERY — PINNING, EXTREMITY, PERCUTANEOUS
Anesthesia: Monitor Anesthesia Care | Laterality: Right

## 2016-04-20 MED ORDER — DEXTROSE 50 % IV SOLN
25.0000 mL | Freq: Once | INTRAVENOUS | Status: DC
  Filled 2016-04-20: qty 50

## 2016-04-20 MED ORDER — VANCOMYCIN HCL IN DEXTROSE 1-5 GM/200ML-% IV SOLN
1000.0000 mg | Freq: Once | INTRAVENOUS | Status: AC
  Administered 2016-04-20: 1000 mg via INTRAVENOUS
  Filled 2016-04-20: qty 200

## 2016-04-20 MED ORDER — ONDANSETRON HCL 4 MG/2ML IJ SOLN
4.0000 mg | Freq: Four times a day (QID) | INTRAMUSCULAR | Status: DC | PRN
  Administered 2016-04-22: 4 mg via INTRAVENOUS
  Filled 2016-04-20: qty 2

## 2016-04-20 MED ORDER — SODIUM CHLORIDE 0.9 % IV BOLUS (SEPSIS): 1000.0000 mL | Freq: Once | INTRAVENOUS | Status: DC

## 2016-04-20 MED ORDER — INSULIN ASPART 100 UNIT/ML ~~LOC~~ SOLN
0.0000 [IU] | Freq: Three times a day (TID) | SUBCUTANEOUS | Status: DC
  Administered 2016-04-21: 1 [IU] via SUBCUTANEOUS
  Administered 2016-04-21 – 2016-04-24 (×10): 2 [IU] via SUBCUTANEOUS
  Administered 2016-04-25: 1 [IU] via SUBCUTANEOUS
  Administered 2016-04-25 – 2016-04-26 (×4): 2 [IU] via SUBCUTANEOUS
  Administered 2016-04-26 – 2016-04-27 (×3): 3 [IU] via SUBCUTANEOUS
  Administered 2016-04-28 – 2016-04-29 (×5): 2 [IU] via SUBCUTANEOUS
  Administered 2016-04-29: 3 [IU] via SUBCUTANEOUS
  Administered 2016-04-30: 2 [IU] via SUBCUTANEOUS

## 2016-04-20 MED ORDER — LIDOCAINE-EPINEPHRINE (PF) 1.5 %-1:200000 IJ SOLN
INTRAMUSCULAR | Status: DC | PRN
  Administered 2016-04-20: 20 mL

## 2016-04-20 MED ORDER — HYDROCODONE-ACETAMINOPHEN 5-325 MG PO TABS
1.0000 | ORAL_TABLET | ORAL | Status: DC | PRN
  Administered 2016-04-20: 1 via ORAL
  Administered 2016-04-20: 2 via ORAL
  Administered 2016-04-21: 1 via ORAL
  Filled 2016-04-20: qty 1
  Filled 2016-04-20: qty 2
  Filled 2016-04-20: qty 1

## 2016-04-20 MED ORDER — BUPIVACAINE HCL (PF) 0.5 % IJ SOLN
INTRAMUSCULAR | Status: AC
  Filled 2016-04-20: qty 30

## 2016-04-20 MED ORDER — BACITRACIN ZINC 500 UNIT/GM EX OINT
TOPICAL_OINTMENT | CUTANEOUS | Status: DC | PRN
  Administered 2016-04-20: 1 via TOPICAL

## 2016-04-20 MED ORDER — ONDANSETRON HCL 4 MG PO TABS: 4.0000 mg | ORAL_TABLET | Freq: Four times a day (QID) | ORAL | Status: DC | PRN

## 2016-04-20 MED ORDER — FENTANYL CITRATE (PF) 100 MCG/2ML IJ SOLN: 25.0000 ug | INTRAMUSCULAR | Status: DC | PRN

## 2016-04-20 MED ORDER — SODIUM CHLORIDE 0.9 % IR SOLN
Status: DC | PRN
  Administered 2016-04-20 (×2): 3000 mL

## 2016-04-20 MED ORDER — DEXTROSE 50 % IV SOLN
INTRAVENOUS | Status: AC
  Administered 2016-04-20: 50 mL
  Filled 2016-04-20: qty 50

## 2016-04-20 MED ORDER — LIDOCAINE 2% (20 MG/ML) 5 ML SYRINGE
INTRAMUSCULAR | Status: AC
  Filled 2016-04-20: qty 5

## 2016-04-20 MED ORDER — TETANUS-DIPHTH-ACELL PERTUSSIS 5-2.5-18.5 LF-MCG/0.5 IM SUSP
0.5000 mL | Freq: Once | INTRAMUSCULAR | Status: AC
Start: 2016-04-20 — End: 2016-04-20
  Administered 2016-04-20: 0.5 mL via INTRAMUSCULAR
  Filled 2016-04-20: qty 0.5

## 2016-04-20 MED ORDER — PROMETHAZINE HCL 25 MG/ML IJ SOLN: 6.2500 mg | INTRAMUSCULAR | Status: DC | PRN

## 2016-04-20 MED ORDER — ONDANSETRON HCL 4 MG/2ML IJ SOLN
INTRAMUSCULAR | Status: AC
  Filled 2016-04-20: qty 2

## 2016-04-20 MED ORDER — PROPOFOL 10 MG/ML IV BOLUS
INTRAVENOUS | Status: AC
  Filled 2016-04-20: qty 20

## 2016-04-20 MED ORDER — ENOXAPARIN SODIUM 40 MG/0.4ML ~~LOC~~ SOLN
40.0000 mg | SUBCUTANEOUS | Status: DC
  Administered 2016-04-20 – 2016-04-21 (×2): 40 mg via SUBCUTANEOUS
  Filled 2016-04-20 (×2): qty 0.4

## 2016-04-20 MED ORDER — BACITRACIN ZINC 500 UNIT/GM EX OINT
TOPICAL_OINTMENT | CUTANEOUS | Status: AC
  Filled 2016-04-20: qty 28.35

## 2016-04-20 MED ORDER — SODIUM CHLORIDE 0.9 % IV BOLUS (SEPSIS)
1000.0000 mL | Freq: Once | INTRAVENOUS | Status: AC
  Administered 2016-04-20: 1000 mL via INTRAVENOUS

## 2016-04-20 MED ORDER — 0.9 % SODIUM CHLORIDE (POUR BTL) OPTIME
TOPICAL | Status: DC | PRN
  Administered 2016-04-20: 1000 mL

## 2016-04-20 MED ORDER — CLINDAMYCIN PHOSPHATE 600 MG/50ML IV SOLN
600.0000 mg | Freq: Three times a day (TID) | INTRAVENOUS | Status: AC
  Administered 2016-04-20 – 2016-04-26 (×20): 600 mg via INTRAVENOUS
  Filled 2016-04-20 (×20): qty 50

## 2016-04-20 MED ORDER — DEXTROSE-NACL 5-0.9 % IV SOLN
INTRAVENOUS | Status: DC
  Administered 2016-04-20 (×2): via INTRAVENOUS

## 2016-04-20 MED ORDER — FENTANYL CITRATE (PF) 250 MCG/5ML IJ SOLN
INTRAMUSCULAR | Status: AC
  Filled 2016-04-20: qty 5

## 2016-04-20 MED ORDER — SODIUM CHLORIDE 0.9 % IV BOLUS (SEPSIS): 500.0000 mL | Freq: Once | INTRAVENOUS | Status: DC

## 2016-04-20 MED ORDER — SODIUM CHLORIDE 0.9 % IV SOLN
INTRAVENOUS | Status: DC
  Administered 2016-04-20: 07:00:00 via INTRAVENOUS

## 2016-04-20 MED ORDER — DEXTROSE 5 % IV SOLN
1.0000 g | Freq: Three times a day (TID) | INTRAVENOUS | Status: DC
  Administered 2016-04-20 – 2016-04-21 (×4): 1 g via INTRAVENOUS
  Filled 2016-04-20 (×5): qty 1

## 2016-04-20 MED ORDER — FENTANYL CITRATE (PF) 250 MCG/5ML IJ SOLN
INTRAMUSCULAR | Status: DC | PRN
  Administered 2016-04-20 (×2): 50 ug via INTRAVENOUS

## 2016-04-20 MED ORDER — SODIUM CHLORIDE 0.9 % IV BOLUS (SEPSIS)
1000.0000 mL | Freq: Once | INTRAVENOUS | Status: AC
Start: 2016-04-20 — End: 2016-04-20
  Administered 2016-04-20: 1000 mL via INTRAVENOUS

## 2016-04-20 MED ORDER — SODIUM CHLORIDE 0.9 % IV SOLN
INTRAVENOUS | Status: DC | PRN
  Administered 2016-04-20: 12:00:00 via INTRAVENOUS

## 2016-04-20 MED ORDER — ASPIRIN EC 81 MG PO TBEC
81.0000 mg | DELAYED_RELEASE_TABLET | Freq: Every day | ORAL | Status: DC
  Administered 2016-04-20 – 2016-04-29 (×8): 81 mg via ORAL
  Filled 2016-04-20 (×11): qty 1

## 2016-04-20 SURGICAL SUPPLY — 42 items
BANDAGE ACE 6X5 VEL STRL LF (GAUZE/BANDAGES/DRESSINGS) ×3 IMPLANT
BANDAGE ELASTIC 3 VELCRO ST LF (GAUZE/BANDAGES/DRESSINGS) IMPLANT
BANDAGE ELASTIC 4 VELCRO ST LF (GAUZE/BANDAGES/DRESSINGS) IMPLANT
BENZOIN TINCTURE PRP APPL 2/3 (GAUZE/BANDAGES/DRESSINGS) IMPLANT
BLADE SURG ROTATE 9660 (MISCELLANEOUS) IMPLANT
BNDG COHESIVE 4X5 WHT NS (GAUZE/BANDAGES/DRESSINGS) ×3 IMPLANT
BNDG CONFORM 3 STRL LF (GAUZE/BANDAGES/DRESSINGS) ×3 IMPLANT
BNDG GAUZE ELAST 4 BULKY (GAUZE/BANDAGES/DRESSINGS) ×9 IMPLANT
CLOSURE WOUND 1/2 X4 (GAUZE/BANDAGES/DRESSINGS)
COVER SURGICAL LIGHT HANDLE (MISCELLANEOUS) ×3 IMPLANT
CUFF TOURNIQUET SINGLE 18IN (TOURNIQUET CUFF) IMPLANT
CUFF TOURNIQUET SINGLE 24IN (TOURNIQUET CUFF) IMPLANT
DRSG EMULSION OIL 3X3 NADH (GAUZE/BANDAGES/DRESSINGS) ×3 IMPLANT
DURAPREP 26ML APPLICATOR (WOUND CARE) IMPLANT
ELECT CAUTERY BLADE 6.4 (BLADE) ×3 IMPLANT
FACESHIELD WRAPAROUND (MASK) IMPLANT
GAUZE SPONGE 4X4 12PLY STRL (GAUZE/BANDAGES/DRESSINGS) ×3 IMPLANT
GAUZE XEROFORM 1X8 LF (GAUZE/BANDAGES/DRESSINGS) ×6 IMPLANT
GAUZE XEROFORM 5X9 LF (GAUZE/BANDAGES/DRESSINGS) ×3 IMPLANT
GLOVE SS BIOGEL STRL SZ 7.5 (GLOVE) ×2 IMPLANT
GLOVE SUPERSENSE BIOGEL SZ 7.5 (GLOVE) ×4
GOWN STRL REIN XL XLG (GOWN DISPOSABLE) ×3 IMPLANT
HOVERMATT SINGLE USE (MISCELLANEOUS) ×3 IMPLANT
K-WIRE SMTH SNGL TROCAR .045X9 (WIRE) ×3
KIT BASIN OR (CUSTOM PROCEDURE TRAY) ×3 IMPLANT
KIT ROOM TURNOVER OR (KITS) ×3 IMPLANT
KWIRE SMTH SNGL TROCAR .045X9 (WIRE) ×1 IMPLANT
NS IRRIG 1000ML POUR BTL (IV SOLUTION) ×3 IMPLANT
PACK ORTHO EXTREMITY (CUSTOM PROCEDURE TRAY) ×3 IMPLANT
PAD ARMBOARD 7.5X6 YLW CONV (MISCELLANEOUS) ×6 IMPLANT
PADDING CAST COTTON 6X4 STRL (CAST SUPPLIES) ×3 IMPLANT
SPONGE GAUZE 4X4 12PLY STER LF (GAUZE/BANDAGES/DRESSINGS) ×3 IMPLANT
STRIP CLOSURE SKIN 1/2X4 (GAUZE/BANDAGES/DRESSINGS) IMPLANT
SUT ETHILON 4 0 P 3 18 (SUTURE) IMPLANT
SUT ETHILON 4 0 PS 2 18 (SUTURE) ×6 IMPLANT
SUT PROLENE 4 0 P 3 18 (SUTURE) IMPLANT
TOWEL OR 17X24 6PK STRL BLUE (TOWEL DISPOSABLE) ×3 IMPLANT
TOWEL OR 17X26 10 PK STRL BLUE (TOWEL DISPOSABLE) ×3 IMPLANT
TUBING CYSTO DISP (UROLOGICAL SUPPLIES) ×3 IMPLANT
UNDERPAD 30X30 INCONTINENT (UNDERPADS AND DIAPERS) ×3 IMPLANT
WATER STERILE IRR 1000ML POUR (IV SOLUTION) ×3 IMPLANT
YANKAUER SUCT BULB TIP NO VENT (SUCTIONS) ×3 IMPLANT

## 2016-04-20 NOTE — Consult Note (Signed)
ORTHOPAEDIC CONSULTATION  REQUESTING PHYSICIAN: Drema Dallasurtis J Woods, MD  Chief Complaint: Right small toe injury  HPI: Alan Briggs is a 82 y.o. male who presents with right small toe open dislocation s/p mechanical fall.  [facility] and [facility] here for orthopedic management due to lack of orthopedic coverage.  Patient has multiple medical problems.  He endorses pain in small toe, does not radiate, throbbing pain, worse with movement.  Denies LOC, neck pain, abd pain.  Ortho consulted.  Past Medical History  Diagnosis Date  . Diabetes (HCC)   . Cardiac pacemaker in situ     for sick sinus syndrome-[facility]  . Mixed hyperlipidemia   . Venous insufficiency (chronic) (peripheral)   . Thrombocytopenia (HCC)   . Sleep apnea   . Pulmonary hypertension (HCC)   . Obstructive lung disease (HCC)     PFT's done 04/22/06   . GERD (gastroesophageal reflux disease)    Past Surgical History  Procedure Laterality Date  . Refractive surgery Right   . Cataract extraction, bilateral Bilateral   . Rotator cuff repair Left   . Kidney stone surgery Left     laser ablation    Social History   Social History  . Marital Status: Married    Spouse Name: N/A  . Number of Children: N/A  . Years of Education: N/A   Social History Main Topics  . Smoking status: Former Smoker -- 0.50 packs/day for 30 years    Types: Cigarettes    Start date: 10/18/1948    Quit date: 10/18/1978  . Smokeless tobacco: Never Used  . Alcohol Use: None  . Drug Use: None  . Sexual Activity: Not Asked   Other Topics Concern  . None   Social History Narrative   Family History  Problem Relation Age of Onset  . Diabetes Mother    - negative except otherwise stated in the family history section Allergies  Allergen Reactions  . Advair Diskus [Fluticasone-Salmeterol]     Told by MD not to use  . Combivent [Ipratropium-Albuterol]     Told by MD not to use  . Lotensin  Hct [Benazepril-Hydrochlorothiazide]     Told by MD not to use  . Penicillins Swelling  . Simvastatin Other (See Comments)    myalgias   Prior to Admission medications   Medication Sig Start Date End Date Taking? Authorizing Provider  acetaminophen (TYLENOL) 500 MG tablet Take 500 mg by mouth every 6 (six) hours as needed.   Yes Historical Provider, MD  amLODipine (NORVASC) 5 MG tablet Take 5 mg by mouth daily.   Yes Historical Provider, MD  aspirin EC 81 MG tablet Take 1 tablet by mouth daily.   Yes Historical Provider, MD  atenolol (TENORMIN) 50 MG tablet Take 1 tablet by mouth daily. 05/24/12  Yes Historical Provider, MD  carboxymethylcellulose (REFRESH PLUS) 0.5 % SOLN Place 1 drop into both eyes 3 (three) times daily as needed.   Yes Historical Provider, MD  ciprofloxacin (CIPRO) 250 MG tablet Take 1 tablet (250 mg total) by mouth 2 (two) times daily. 04/19/16  Yes Estela Isaiah BlakesY Hernandez Acosta, MD  docusate sodium (COLACE) 100 MG capsule Take 1 capsule (100 mg total) by mouth 2 (two) times daily. 04/19/16  Yes Henderson CloudEstela Y Hernandez Acosta, MD  hydrochlorothiazide (HYDRODIURIL) 25 MG tablet Take 1 tablet by mouth daily. 10/31/11  Yes Historical Provider, MD  insulin glargine (LANTUS) 100 UNIT/ML injection Inject 56 Units  into the skin 2 (two) times daily.  03/22/14  Yes Historical Provider, MD  insulin lispro (HUMALOG) 100 UNIT/ML injection Inject 12 Units into the skin 3 (three) times daily before meals.    Yes Historical Provider, MD  losartan (COZAAR) 50 MG tablet Take 1 tablet by mouth daily. 05/01/12  Yes Historical Provider, MD  Multiple Vitamins-Minerals (ICAPS PO) Take 1 capsule by mouth daily.   Yes Historical Provider, MD  Omega-3 1000 MG CAPS Take 1 g by mouth daily.    Yes Historical Provider, MD  polyethylene glycol (MIRALAX / GLYCOLAX) packet Take 17 g by mouth daily. 04/19/16  Yes Henderson CloudEstela Y Hernandez Acosta, MD  rosuvastatin (CRESTOR) 10 MG tablet Take 10 mg by mouth daily.   Yes Historical  Provider, MD   Dg Chest Port 1 View  04/20/2016  CLINICAL DATA:  Status post fall, with concern for chest injury. Initial encounter. EXAM: PORTABLE CHEST 1 VIEW COMPARISON:  None. FINDINGS: The lungs are well-aerated. Vascular congestion is noted. Bibasilar airspace opacities may reflect mild interstitial edema. There is no evidence of pleural effusion or pneumothorax. The cardiomediastinal silhouette is mildly enlarged. A pacemaker is noted overlying the left chest wall, with leads ending overlying the right atrium and right ventricle. No acute osseous abnormalities are seen. IMPRESSION: Vascular congestion and mild cardiomegaly. Bibasilar airspace opacities may reflect mild interstitial edema. No displaced rib fracture seen. Electronically Signed   By: Roanna RaiderJeffery  Chang M.D.   On: 04/20/2016 02:28   Dg Foot Complete Right  04/20/2016  CLINICAL DATA:  Status post fall, with laceration at the proximal right foot. Initial encounter. EXAM: RIGHT FOOT COMPLETE - 3+ VIEW COMPARISON:  None. FINDINGS: There is displacement at the fifth proximal interphalangeal joint, which raises concern for dislocation. Would correlate with the patient's symptoms. No definite acute fractures are seen. Diffuse soft tissue swelling is noted about the forefoot and midfoot. The subtalar joint is grossly unremarkable in appearance. The known soft tissue laceration is not well characterized on radiograph. IMPRESSION: Concern for dislocation at the fifth proximal interphalangeal joint. Would correlate with the patient's symptoms. No definite acute fracture seen. Electronically Signed   By: Roanna RaiderJeffery  Chang M.D.   On: 04/20/2016 02:29   - pertinent xrays, CT, MRI studies were reviewed and independently interpreted  Positive ROS: All other systems have been reviewed and were otherwise negative with the exception of those mentioned in the HPI and as above.  Physical Exam: General: Alert, no acute distress Cardiovascular: No pedal  edema Respiratory: No cyanosis, no use of accessory musculature GI: No organomegaly, abdomen is soft and non-tender Skin: No lesions in the area of chief complaint Neurologic: Sensation intact distally Psychiatric: Patient is competent for consent with normal mood and affect Lymphatic: No axillary or cervical lymphadenopathy  MUSCULOSKELETAL:  - open medial wound of small toe with exposed proximal phalanx - no gross contamination - toe wwp  Assessment: Open dislocation of right small toe PIP joint  Plan: - clindamycin ordered - will take to OR for formal I&D and perc pinning - NPO  Thank you for the consult and the opportunity to see Alan Briggs  N. Glee ArvinMichael Xu, MD [facility] [phone] 11:23 AM

## 2016-04-20 NOTE — Progress Notes (Signed)
[facility]  Patient Status: Active (receiving services up to time of hospitalization)  [facility] is providing the following services: RN and MSW  Referred for services but admitted to Hospital prior to start of services.  If patient discharges after hours, please call [phone].   Alan FurnishDonna Fellmy 04/20/2016, 10:04 AM

## 2016-04-20 NOTE — Transfer of Care (Signed)
Immediate Anesthesia Transfer of Care Note  Patient: Alan Briggs  Procedure(s) Performed: Procedure(s): IRRIGATION AND DEBRIDEMENT RIGHT FOOT, PERCUTANEOUS PINNING SMALL TOE (Right)  Patient Location: PACU  Anesthesia Type:MAC  Level of Consciousness: awake, alert , oriented and patient cooperative  Airway & Oxygen Therapy: Patient Spontanous Breathing and Patient connected to nasal cannula oxygen  Post-op Assessment: Report given to RN, Post -op Vital signs reviewed and stable and Patient moving all extremities  Post vital signs: Reviewed and stable  Last Vitals:  Filed Vitals:   04/20/16 1330 04/20/16 1335  BP:  176/144  Pulse:  62  Temp:  36.6 C  Resp: 15 16    Last Pain:  Filed Vitals:   04/20/16 1337  PainSc: 7          Complications: No apparent anesthesia complications

## 2016-04-20 NOTE — Op Note (Signed)
   Date of Surgery: 04/20/2016  INDICATIONS: Mr. [name] is a 82 y.o.-year-old male with a right open small toe PIP dislocation and plantar lacerations to the 3rd and 4th toes;  The patient did consent to the procedure after discussion of the risks and benefits.  PREOPERATIVE DIAGNOSIS:  1. Right type 2 open small toe PIP joint dislocation 2. Right 3rd toe laceration, 3 cm 3. Right 4th toe laceration, 4 cm  POSTOPERATIVE DIAGNOSIS: Same.  PROCEDURE:  1. Irrigation and debridement of bone, skin, soft tissue associated with open 5th PIP joint dislocation 2. Percutaneous fixation of right 5th toe dislocation 3. Complex wound repair 3rd toe, 3 cm 4. Complex wound repair 4th toe, 4 cm 5. Complex wound repair 5th toe, 4 cm 6. Debridement of subcutaneous tissue of 3rd and 4th toes, 7 x 2  SURGEON: N. Glee ArvinMichael Xu, M.D.  ASSIST: none.  ANESTHESIA:  Ankle block and conscious sedation  IV FLUIDS AND URINE: See anesthesia.  ESTIMATED BLOOD LOSS: minimal mL.  IMPLANTS: 0.045 K wire x 1  DRAINS: none  COMPLICATIONS: None.  DESCRIPTION OF PROCEDURE: The patient was brought to the operating room and placed supine on the operating table.  The patient had been signed prior to the procedure and this was documented. The patient had the anesthesia placed by the anesthesiologist.  A time-out was performed to confirm that this was the correct patient, site, side and location. The patient did receive antibiotics prior to the incision and was re-dosed during the procedure as needed at indicated intervals.  A tourniquet not placed.  The patient had the operative extremity prepped and draped in the standard surgical fashion.    We first cleaned out the dried blood and evaluated the foot. He demonstrated 3 open wounds one on each toe from the third to the fifth toe. I then performed sharp excisional debridement of the fifth toe open dislocation of the skin and soft tissue and bone rongeur. I also performed sharp  excisional debridement of the skin and subcutaneous tissue of the third and fourth toes. There was no gross contamination. After debridement I then thoroughly irrigated all of the wounds with 6 L of normal saline using cystoscopy tubing. I then performed a complex wound closure of the third and fourth toe lacerations. I then used a mini C-arm in order to place a percutaneous pin down the axis of the fifth toe in order to hold the toe together. The PIP joint was grossly unstable without fixation. I then performed complex wound repair of the fifth toe laceration.  Bacitracin ointment and Xeroform were placed on the wound. The pin was bent and then cut short. Sterile dressings were applied. Patient tolerated procedure well had no immediate complications.  POSTOPERATIVE PLAN: The patient will be weightbearing to the heel of the right foot only.  Mayra ReelN. Michael Xu, MD [facility] [phone] 1:29 PM

## 2016-04-20 NOTE — Progress Notes (Signed)
  Echocardiogram 2D Echocardiogram has been performed.  Janalyn HarderWest, Niquita Digioia R 04/20/2016, 3:02 PM

## 2016-04-20 NOTE — Anesthesia Procedure Notes (Signed)
Anesthesia Regional Block:  Ankle block  Pre-Anesthetic Checklist: ,, timeout performed, Correct Patient, Correct Site, Correct Laterality, Correct Procedure, Correct Position, site marked, Risks and benefits discussed, pre-op evaluation,  Laterality: Right  Prep: chloraprep       Needles:  Injection technique: Single-shot  Needle Type: Other      Needle Gauge: 25 and 25 G    Additional Needles:  Procedures: other Ankle block Narrative:  Injection made incrementally with aspirations every 5 mL.  Performed by: Personally  Anesthesiologist: Keneshia Tena  Additional Notes: Patient tolerated the procedure well without complications

## 2016-04-20 NOTE — Progress Notes (Signed)
PROGRESS NOTE    Alan CollumRobert C Briggs  ZOX:[id number] DOB: 1922-05-11 DOA: 04/20/2016 PCP: Dwana MelenaZack Hall, MD   Brief Narrative:  Alan PoissonRobert Briggs is a 82 y.o. WM PMHx DM Type 2 uncontrolled with complication, HTN, Pulmonary Hypertension, Sick Sinus Syndrome S/P Permanent Pacemaker 09/2007, OSA,Obstructive lung disease , Nephrolithiasis with Hydronephrosis and Ureteral edema on left side  Just discharged from the hospital 7/3.  Patient was sent home on antibiotics for possible UTI, urine culture grew multiple species. Patient lives by himself today when he got up to walk to the bathroom with his walker and he tried to sit down on the commode he fell to the ground because legs gave out. Patient was unable to get up from the ground and neighbors came and found him after several hours of laying on the ground by himself. Patient was found to have laceration in the right foot. He denies chest pain or shortness of breath. In the ED patient was found to be hypotensive and prehospital blood glucose was 88.   Assessment & Plan:   Active Problems:   Sepsis (HCC)   Pancreatic mass   Sepsis, unspecified organism (HCC)   Chronic obstructive pulmonary disease (HCC)   OSA (obstructive sleep apnea)   Controlled diabetes mellitus type 2 with complications (HCC)   Sick sinus syndrome (HCC)   Presence of permanent cardiac pacemaker  Foot laceration/dislocated vs fractured Rt fifth proximal interphalangeal joint -Dr.Naiping Donnelly StagerM Xu [facility] plans to take patient for formal I&D and perc pinning -Continue current antibiotics which will cover foot and UTI  Sepsis unspecified organism/UTI?  -Patiently currently hypoglycemic, start D5-0.9% saline at 18400ml/hr  Sick Sinus Syndrome S/P pacemaker -Unknown cardiac status, Echocardiogram pending -Strict in and out -Daily weight Filed Weights   04/20/16 0113 04/20/16 0600  Weight: 138.347 kg (305 lb) 145.7 kg (321 lb 3.4 oz)   Obstructive lung disease -Patient is  supposed to use home O2, per patient has been refusing -Titrate O2 to maintain SPO2 89-93%  OSA - CPAP per respiratory therapy  Diabetes mellitus controlled with complication -7/1 hemoglobin A1c= 5.5 -See sepsis -Sensitive SSI  Pancreatic mass -CT abdomen pancreatic protocol pending   DVT prophylaxis: Lovenox Code Status: DO NOT RESUSCITATE Family Communication: Son present at bedside Disposition Plan: Per orthopedic surgery   Consultants:  Dr.Naiping Donnelly [facility]agerM [facility]    Procedures/Significant Events:  7/4 Right foot x-ray:There is displacement at the fifth proximal interphalangeal joint,dislocation?.  7/1 CT renal stone survey;- 3.8 cm round lesion in the pancreatic tail. -May saccular aneurysm of the splenic artery or a primary pancreatic cyst or neoplasm.-. Probable cyst interpolar right kidney.    Cultures 7/1 urine positive multiple species 7/4 urine pending 7/4 blood right AC/forearm NGTD 7/4 MRSA by PCR negative   Antimicrobials: Aztreonam 7/4>> Clindamycin 7/4>>   Devices NA   LINES / TUBES:  NA    Continuous Infusions: . dextrose 5 % and 0.9% NaCl 100 mL/hr at 04/20/16 1157     Subjective: 7/4  A/O 4,. States stays at home by himself. Is supposed to be on home oxygen however is not using it.     Objective: Filed Vitals:   04/20/16 1355 04/20/16 1600 04/20/16 1646 04/20/16 1947  BP: 75/30 90/51 90/49  84/50  Pulse: 60 59 61   Temp: 98 F (36.7 C)  97.3 F (36.3 C) 98.2 F (36.8 C)  TempSrc:   Axillary Axillary  Resp: 22 18 14 13   Height:      Weight:  SpO2: 97% 95% 98% 93%    Intake/Output Summary (Last 24 hours) at 04/20/16 1952 Last data filed at 04/20/16 1854  Gross per 24 hour  Intake   1380 ml  Output      3 ml  Net   1377 ml   Filed Weights   04/20/16 0113 04/20/16 0600  Weight: 138.347 kg (305 lb) 145.7 kg (321 lb 3.4 oz)    Examination:  General: A/O 4, positive pain to right foot, No acute  respiratory distress Eyes: negative scleral hemorrhage, negative anisocoria, negative icterus ENT: Negative Runny nose, negative gingival bleeding, Neck:  Negative scars, masses, torticollis, lymphadenopathy, JVD Lungs: Clear to auscultation bilaterally without wheezes or crackles Cardiovascular: Regular rate and rhythm without murmur gallop or rub normal S1 and S2 Abdomen: Morbidly obese, negative abdominal pain, nondistended, positive soft, bowel sounds, no rebound, no ascites, no appreciable mass Extremities: bilateral pedal edema Rt>>Lt right foot bleeding, fifth metatarsal displaced.,  Skin: Negative rashes, lesions, ulcers Psychiatric:  Negative depression, negative anxiety, negative fatigue, negative mania  Central nervous system:  Cranial nerves II through XII intact, tongue/uvula midline, all extremities muscle strength 5/5, sensation intact throughout, negative dysarthria, negative expressive aphasia, negative receptive aphasia.  .     Data Reviewed: Care during the described time interval was provided by me .  I have reviewed this patient's available data, including medical history, events of note, physical examination, and all test results as part of my evaluation. I have personally reviewed and interpreted all radiology studies.  CBC:  Recent Labs Lab 04/17/16 1651 04/17/16 1724 04/18/16 0602 04/20/16 0130 04/20/16 0649  WBC 10.3  --  9.1 16.9* 13.6*  NEUTROABS 6.8  --   --  13.7*  --   HGB 14.5 15.0 13.9 13.2 12.5*  HCT 43.4 44.0 43.1 39.6 38.9*  MCV 99.8  --  102.1* 100.8* 101.6*  PLT 129*  --  117* 142* 124*   Basic Metabolic Panel:  Recent Labs Lab 04/17/16 1650 04/17/16 1724 04/18/16 0602 04/20/16 0130 04/20/16 0649  NA  --  136 134* 130* 133*  K  --  3.9 4.2 5.0 4.6  CL  --  95* 96* 94* 98*  CO2  --   --  30 27 26   GLUCOSE  --  97 121* 60* 83  BUN  --  22* 27* 66* 57*  CREATININE 0.99 1.00 1.21 3.93* 3.25*  CALCIUM  --   --  8.3* 7.8* 7.7*    GFR: Estimated Creatinine Clearance: 21.5 mL/min (by C-G formula based on Cr of 3.25). Liver Function Tests:  Recent Labs Lab 04/18/16 0602 04/20/16 0130 04/20/16 0649  AST 29 29 34  ALT 24 23 21   ALKPHOS 50 45 40  BILITOT 0.9 1.0 0.7  PROT 6.2* 6.4* 5.5*  ALBUMIN 3.3* 3.0* 2.6*   No results for input(s): LIPASE, AMYLASE in the last 168 hours. No results for input(s): AMMONIA in the last 168 hours. Coagulation Profile: No results for input(s): INR, PROTIME in the last 168 hours. Cardiac Enzymes:  Recent Labs Lab 04/20/16 0130  CKTOTAL 368  TROPONINI <0.03   BNP (last 3 results) No results for input(s): PROBNP in the last 8760 hours. HbA1C: No results for input(s): HGBA1C in the last 72 hours. CBG:  Recent Labs Lab 04/20/16 0502 04/20/16 0624 04/20/16 1143 04/20/16 1334 04/20/16 1646  GLUCAP 79 80 65 118* 107*   Lipid Profile: No results for input(s): CHOL, HDL, LDLCALC, TRIG, CHOLHDL, LDLDIRECT in the last  72 hours. Thyroid Function Tests: No results for input(s): TSH, T4TOTAL, FREET4, T3FREE, THYROIDAB in the last 72 hours. Anemia Panel: No results for input(s): VITAMINB12, FOLATE, FERRITIN, TIBC, IRON, RETICCTPCT in the last 72 hours. Urine analysis:    Component Value Date/Time   COLORURINE AMBER* 04/20/2016 0130   APPEARANCEUR CLEAR 04/20/2016 0130   LABSPEC 1.025 04/20/2016 0130   PHURINE 5.0 04/20/2016 0130   GLUCOSEU NEGATIVE 04/20/2016 0130   HGBUR MODERATE* 04/20/2016 0130   BILIRUBINUR NEGATIVE 04/20/2016 0130   KETONESUR NEGATIVE 04/20/2016 0130   PROTEINUR NEGATIVE 04/20/2016 0130   NITRITE NEGATIVE 04/20/2016 0130   LEUKOCYTESUR TRACE* 04/20/2016 0130   Sepsis Labs: (procalcitonin:4,lacticidven:4)  ) Recent Results (from the past 240 hour(s))  Urine culture     Status: Abnormal   Collection Time: 04/17/16  5:16 PM  Result Value Ref Range Status   Specimen Description URINE, RANDOM  Final   Special Requests Normal   Final   Culture MULTIPLE SPECIES PRESENT, SUGGEST RECOLLECTION (A)  Final   Report Status 04/19/2016 FINAL  Final  Blood Culture (routine x 2)     Status: None (Preliminary result)   Collection Time: 04/20/16  2:00 AM  Result Value Ref Range Status   Specimen Description BLOOD RIGHT ANTECUBITAL  Final   Special Requests   Final    BOTTLES DRAWN AEROBIC AND ANAEROBIC AEB 12CC ANA 10CC   Culture NO GROWTH < 12 HOURS  Final   Report Status PENDING  Incomplete  Blood Culture (routine x 2)     Status: None (Preliminary result)   Collection Time: 04/20/16  2:15 AM  Result Value Ref Range Status   Specimen Description BLOOD RIGHT FOREARM  Final   Special Requests BOTTLES DRAWN AEROBIC AND ANAEROBIC 10CC EACH  Final   Culture NO GROWTH < 12 HOURS  Final   Report Status PENDING  Incomplete  MRSA PCR Screening     Status: None   Collection Time: 04/20/16  6:12 AM  Result Value Ref Range Status   MRSA by PCR NEGATIVE NEGATIVE Final    Comment:        The GeneXpert MRSA Assay (FDA approved for NASAL specimens only), is one component of a comprehensive MRSA colonization surveillance program. It is not intended to diagnose MRSA infection nor to guide or monitor treatment for MRSA infections.          Radiology Studies: Dg Chest Port 1 View  04/20/2016  CLINICAL DATA:  Status post fall, with concern for chest injury. Initial encounter. EXAM: PORTABLE CHEST 1 VIEW COMPARISON:  None. FINDINGS: The lungs are well-aerated. Vascular congestion is noted. Bibasilar airspace opacities may reflect mild interstitial edema. There is no evidence of pleural effusion or pneumothorax. The cardiomediastinal silhouette is mildly enlarged. A pacemaker is noted overlying the left chest wall, with leads ending overlying the right atrium and right ventricle. No acute osseous abnormalities are seen. IMPRESSION: Vascular congestion and mild cardiomegaly. Bibasilar airspace opacities may reflect mild interstitial  edema. No displaced rib fracture seen. Electronically Signed   By: Roanna Raider M.D.   On: 04/20/2016 02:28   Dg Foot Complete Right  04/20/2016  CLINICAL DATA:  Status post fall, with laceration at the proximal right foot. Initial encounter. EXAM: RIGHT FOOT COMPLETE - 3+ VIEW COMPARISON:  None. FINDINGS: There is displacement at the fifth proximal interphalangeal joint, which raises concern for dislocation. Would correlate with the patient's symptoms. No definite acute fractures are seen. Diffuse soft tissue swelling is noted  about the forefoot and midfoot. The subtalar joint is grossly unremarkable in appearance. The known soft tissue laceration is not well characterized on radiograph. IMPRESSION: Concern for dislocation at the fifth proximal interphalangeal joint. Would correlate with the patient's symptoms. No definite acute fracture seen. Electronically Signed   By: Roanna Raider M.D.   On: 04/20/2016 02:29        Scheduled Meds: . aspirin EC  81 mg Oral Daily  . aztreonam  1 g Intravenous Q8H  . clindamycin (CLEOCIN) IV  600 mg Intravenous Q8H  . dextrose  25 mL Intravenous Once  . enoxaparin (LOVENOX) injection  40 mg Subcutaneous Q24H  . insulin aspart  0-9 Units Subcutaneous TID WC  . sodium chloride  1,000 mL Intravenous Once   And  . sodium chloride  1,000 mL Intravenous Once   And  . sodium chloride  500 mL Intravenous Once  . sodium chloride  1,000 mL Intravenous Once   Continuous Infusions: . dextrose 5 % and 0.9% NaCl 100 mL/hr at 04/20/16 1157     LOS: 0 days    Time spent: 40 minutes    Lucretia Pendley, Roselind Messier, MD [facility] Pager [phone]   If 7PM-7AM, please contact night-coverage [email or web address] Password [email or web address] 04/20/2016, 7:52 PM

## 2016-04-20 NOTE — Progress Notes (Signed)
OT Cancellation Note  Patient Details Name: Alan CollumRobert C Briggs MRN: [id number] DOB: 10/30/1921   Cancelled Treatment:    Reason Eval/Treat Not Completed: Patient not medically ready - note, pt for surgical repair of toe.  Will follow up tomorrow as medically appropriate.    Angelene GiovanniConarpe, Merel Santoli M  Nivia Gervase Portlandonarpe, OTR/L [phone]  04/20/2016, 12:12 PM

## 2016-04-20 NOTE — H&P (Signed)
TRH H&P   Patient Demographics:    Alan Briggs, is a 82 y.o. male  MRN: [id number]   DOB - 24-Aug-1922  Admit Date - 04/20/2016  Outpatient Primary MD for the patient is [name], MD  Referring MD/NP/PA: Dr Hyacinth Meeker  Patient coming from: Home  Chief Complaint  Patient presents with  . Fall      HPI:    Alan Briggs  is a 82 y.o. male, With history of diabetes mellitus, hypertension, kidney stones with hydronephrosis and ureteral edema on left side who was just discharged from the hospital yesterday. Patient was sent home on antibiotics for possible UTI, urine culture grew multiple species. Patient lives by himself today when he got up to walk to the bathroom with his walker and he tried to sit down on the commode he fell to the ground because legs gave out. Patient was unable to get up from the ground and neighbors came and found him after several hours of laying on the ground by himself. Patient was found to have laceration in the right foot. He denies chest pain or shortness of breath. In the ED patient was found to be hypotensive and prehospital blood glucose was 88.     Review of systems:    In addition to the HPI above,  No Fever-chills, No Headache, No changes with Vision or hearing, No problems swallowing food or Liquids, No Chest pain, Cough or Shortness of Breath, No Abdominal pain, No Nausea or Vommitting, Bowel movements are regular, No Blood in stool or Urine, No dysuria, No new skin rashes or bruises, No new joints pains-aches,  No new weakness, tingling, numbness in any extremity, No recent weight gain or loss, No polyuria, polydypsia or polyphagia, No significant Mental Stressors.  A full 10 point Review of Systems was done, except as stated above, all other Review of Systems were negative.   With Past History of the following :    Past Medical History  Diagnosis Date  .  Diabetes (HCC)   . Cardiac pacemaker in situ     for sick sinus syndrome-last placement was 09/2007 [facility]  . Mixed hyperlipidemia   . Venous insufficiency (chronic) (peripheral)   . Thrombocytopenia (HCC)   . Sleep apnea   . Pulmonary hypertension (HCC)   . Obstructive lung disease (HCC)     PFT's done 04/22/06   . GERD (gastroesophageal reflux disease)       Past Surgical History  Procedure Laterality Date  . Refractive surgery Right   . Cataract extraction, bilateral Bilateral   . Rotator cuff repair Left   . Kidney stone surgery Left     laser ablation       Social History:     Social History  Substance Use Topics  . Smoking status: Former Smoker -- 0.50 packs/day for 30 years    Types: Cigarettes    Start date: 10/18/1948    Quit date: 10/18/1978  . Smokeless tobacco: Never Used  . Alcohol Use: Not  on file        Family History :     Family History  Problem Relation Age of Onset  . Diabetes Mother       Home Medications:   Prior to Admission medications   Medication Sig Start Date End Date Taking? Authorizing Provider  acetaminophen (TYLENOL) 500 MG tablet Take 500 mg by mouth every 6 (six) hours as needed.    Historical Provider, MD  amLODipine (NORVASC) 5 MG tablet Take 5 mg by mouth daily.    Historical Provider, MD  aspirin EC 81 MG tablet Take 1 tablet by mouth daily.    Historical Provider, MD  atenolol (TENORMIN) 50 MG tablet Take 1 tablet by mouth daily. 05/24/12   Historical Provider, MD  carboxymethylcellulose (REFRESH PLUS) 0.5 % SOLN Place 1 drop into both eyes 3 (three) times daily as needed.    Historical Provider, MD  ciprofloxacin (CIPRO) 250 MG tablet Take 1 tablet (250 mg total) by mouth 2 (two) times daily. 04/19/16   Henderson Cloud, MD  docusate sodium (COLACE) 100 MG capsule Take 1 capsule (100 mg total) by mouth 2 (two) times daily. 04/19/16   Henderson Cloud, MD  hydrochlorothiazide (HYDRODIURIL) 25 MG  tablet Take 1 tablet by mouth daily. 10/31/11   Historical Provider, MD  insulin glargine (LANTUS) 100 UNIT/ML injection Inject 56 Units into the skin 2 (two) times daily.  03/22/14   Historical Provider, MD  insulin lispro (HUMALOG) 100 UNIT/ML injection Inject 12 Units into the skin 3 (three) times daily before meals.     Historical Provider, MD  losartan (COZAAR) 50 MG tablet Take 1 tablet by mouth daily. 05/01/12   Historical Provider, MD  Multiple Vitamins-Minerals (ICAPS PO) Take 1 capsule by mouth daily.    Historical Provider, MD  Omega-3 1000 MG CAPS Take 1 g by mouth daily.     Historical Provider, MD  polyethylene glycol (MIRALAX / GLYCOLAX) packet Take 17 g by mouth daily. 04/19/16   Henderson Cloud, MD  rosuvastatin (CRESTOR) 10 MG tablet Take 10 mg by mouth daily.    Historical Provider, MD     Allergies:     Allergies  Allergen Reactions  . Advair Diskus [Fluticasone-Salmeterol]     Told by MD not to use  . Combivent [Ipratropium-Albuterol]     Told by MD not to use  . Lotensin Hct [Benazepril-Hydrochlorothiazide]     Told by MD not to use  . Penicillins Swelling  . Simvastatin Other (See Comments)    myalgias     Physical Exam:   Vitals  Blood pressure 95/53, pulse 60, resp. rate 21, weight 138.347 kg (305 lb), SpO2 96 %.   1. General Morbidly obese male lying in bed in NAD, cooperative with exam  2. Normal affect and insight, Not Suicidal or Homicidal, Awake Alert, Oriented X 3.  3. No F.N deficits, ALL C.Nerves Intact, Strength 5/5 all 4 extremities, Sensation intact all 4 extremities, Plantars down going.  4. Ears and Eyes appear Normal, Conjunctivae clear, PERRLA. Moist Oral Mucosa.  5. Supple Neck, No JVD, No cervical lymphadenopathy appriciated, No Carotid Bruits.  6. Symmetrical Chest wall movement, Good air movement bilaterally, CTAB.  7. RRR, No Gallops, Rubs or Murmurs, No Parasternal Heave.  8. Positive Bowel Sounds, Abdomen Soft, No  tenderness, No organomegaly appriciated,No rebound -guarding or rigidity.  9.  No Cyanosis, Normal Skin Turgor, No Skin Rash or Bruise.  10. Good muscle tone,  bilateral trace edema lower extremities, right lower extremity with 3 lacerations to the 3 lateral toes      Data Review:    CBC  Recent Labs Lab 04/17/16 1651 04/17/16 1724 04/18/16 0602 04/20/16 0130  WBC 10.3  --  9.1 16.9*  HGB 14.5 15.0 13.9 13.2  HCT 43.4 44.0 43.1 39.6  PLT 129*  --  117* 142*  MCV 99.8  --  102.1* 100.8*  MCH 33.3  --  32.9 33.6  MCHC 33.4  --  32.3 33.3  RDW 15.1  --  15.5 15.7*  LYMPHSABS 2.0  --   --  0.8  MONOABS 1.2*  --   --  2.3*  EOSABS 0.2  --   --  0.0  BASOSABS 0.0  --   --  0.0   ------------------------------------------------------------------------------------------------------------------  Chemistries   Recent Labs Lab 04/17/16 1650 04/17/16 1724 04/18/16 0602 04/20/16 0130  NA  --  136 134* 130*  K  --  3.9 4.2 5.0  CL  --  95* 96* 94*  CO2  --   --  30 27  GLUCOSE  --  97 121* 60*  BUN  --  22* 27* 66*  CREATININE 0.99 1.00 1.21 3.93*  CALCIUM  --   --  8.3* 7.8*  AST  --   --  29 29  ALT  --   --  24 23  ALKPHOS  --   --  50 45  BILITOT  --   --  0.9 1.0     Cardiac Enzymes  Recent Labs Lab 04/20/16 0130  TROPONINI <0.03   ------------------------------------------------------------------------------------------------------------------    Component Value Date/Time   BNP 254.0* 04/20/2016 0130     ---------------------------------------------------------------------------------------------------------------  Urinalysis    Component Value Date/Time   COLORURINE AMBER* 04/20/2016 0130   APPEARANCEUR CLEAR 04/20/2016 0130   LABSPEC 1.025 04/20/2016 0130   PHURINE 5.0 04/20/2016 0130   GLUCOSEU NEGATIVE 04/20/2016 0130   HGBUR MODERATE* 04/20/2016 0130   BILIRUBINUR NEGATIVE 04/20/2016 0130   KETONESUR NEGATIVE 04/20/2016 0130   PROTEINUR  NEGATIVE 04/20/2016 0130   NITRITE NEGATIVE 04/20/2016 0130   LEUKOCYTESUR TRACE* 04/20/2016 0130    ----------------------------------------------------------------------------------------------------------------   Imaging Results:    Dg Chest Port 1 View  04/20/2016  CLINICAL DATA:  Status post fall, with concern for chest injury. Initial encounter. EXAM: PORTABLE CHEST 1 VIEW COMPARISON:  None. FINDINGS: The lungs are well-aerated. Vascular congestion is noted. Bibasilar airspace opacities may reflect mild interstitial edema. There is no evidence of pleural effusion or pneumothorax. The cardiomediastinal silhouette is mildly enlarged. A pacemaker is noted overlying the left chest wall, with leads ending overlying the right atrium and right ventricle. No acute osseous abnormalities are seen. IMPRESSION: Vascular congestion and mild cardiomegaly. Bibasilar airspace opacities may reflect mild interstitial edema. No displaced rib fracture seen. Electronically Signed   By: Roanna RaiderJeffery  Chang M.D.   On: 04/20/2016 02:28   Dg Foot Complete Right  04/20/2016  CLINICAL DATA:  Status post fall, with laceration at the proximal right foot. Initial encounter. EXAM: RIGHT FOOT COMPLETE - 3+ VIEW COMPARISON:  None. FINDINGS: There is displacement at the fifth proximal interphalangeal joint, which raises concern for dislocation. Would correlate with the patient's symptoms. No definite acute fractures are seen. Diffuse soft tissue swelling is noted about the forefoot and midfoot. The subtalar joint is grossly unremarkable in appearance. The known soft tissue laceration is not well characterized on radiograph. IMPRESSION: Concern for dislocation at the  fifth proximal interphalangeal joint. Would correlate with the patient's symptoms. No definite acute fracture seen. Electronically Signed   By: Roanna RaiderJeffery  Chang M.D.   On: 04/20/2016 02:29    My personal review of EKG: Paced rhythm   Assessment & Plan:    Active  Problems:   Sepsis (HCC)     1. Sepsis- likely from urosepsis. Start vancomycin and Zosyn empirically. Follow urine cultures and blood culture results. Chest x-ray is clear. Lactic acid 2.74. Follow serial lactic acid levels. Patient was hypotensive with systolic blood pressure in 80s,  started on IV fluid boluses at 30 mL per KG. We will continue normal saline at 125 ml/hr  2. Foot laceration- [facility]. Orthopedics consultation will be obtained in a.m.  3.  Diabetes mellitus - patient hypoglycemic with blood glucose 88 , but hold Levemir at this time. Start sliding scale insulin with NovoLog.  [facility], Dr Ival Biblehris Danford has accepted the patient for transfer.   DVT Prophylaxis-   Lovenox   AM Labs Ordered, also please review Full Orders  Family Communication: Discussed with patient's son at bedside  Code Status:  DO NOT RESUSCITATE  Admission status: inpatient  Time spent in minutes : 60 min   Kweli Grassel S M.D on 04/20/2016 at 4:18 AM  Between 7am to 7pm - Pager - [phone]. After 7pm go to [email or web address] - password [email or web address]  [facility] - Office  [phone]

## 2016-04-20 NOTE — Anesthesia Postprocedure Evaluation (Signed)
Anesthesia Post Note  Patient: Alan CollumRobert C Briggs  Procedure(s) Performed: Procedure(s) (LRB): IRRIGATION AND DEBRIDEMENT RIGHT FOOT, PERCUTANEOUS PINNING SMALL TOE (Right)  Patient location during evaluation: PACU Anesthesia Type: MAC and Regional Level of consciousness: awake and alert Pain management: pain level controlled Vital Signs Assessment: post-procedure vital signs reviewed and stable Respiratory status: spontaneous breathing, nonlabored ventilation, respiratory function stable and patient connected to nasal cannula oxygen Cardiovascular status: blood pressure returned to baseline and stable Postop Assessment: no signs of nausea or vomiting Anesthetic complications: no    Last Vitals:  Filed Vitals:   04/20/16 1350 04/20/16 1355  BP: 76/32 75/30  Pulse: 62 60  Temp:  36.7 C  Resp: 19 22    Last Pain:  Filed Vitals:   04/20/16 1423  PainSc: 7                  Allyce Bochicchio S

## 2016-04-20 NOTE — ED Notes (Signed)
Pt fell at home, pt states his legs gave out when trying to get on toilet. Pt was discharged from hospital 04/19/16.

## 2016-04-20 NOTE — Progress Notes (Signed)
Orthopedic Tech Progress Note Patient Details:  Alan CollumRobert C Briggs [birth year] [id number]  Ortho Devices Type of Ortho Device: Postop shoe/boot Ortho Device/Splint Location: RLE foot Ortho Device/Splint Interventions: Ordered, Application   Alan MoccasinHughes, Alan Briggs 04/20/2016, 4:33 PM

## 2016-04-20 NOTE — Progress Notes (Signed)
PT Cancellation Note  Patient Details Name: Alan CollumRobert C Briggs MRN: [id number] DOB: 1922/09/30   Cancelled Treatment:    Reason Eval/Treat Not Completed: Patient not medically ready -- note, pt for surgical repair of toe today. Will follow up tomorrow as medically appropriate.    [name] 04/20/2016, 12:32 PM  Pager [phone]

## 2016-04-20 NOTE — OR Nursing (Signed)
Dr. Okey Dupreose informed of BP and other relevant assessment. No new orders, approval to return to [facility] and proceed with treatment for sepsis. BP more stable on arrival to [facility] 08.

## 2016-04-20 NOTE — Anesthesia Preprocedure Evaluation (Addendum)
Anesthesia Evaluation  Patient identified by MRN, date of birth, ID band Patient awake    Reviewed: Allergy & Precautions, NPO status , Patient's Chart, lab work & pertinent test results  Airway Mallampati: II  TM Distance: >3 FB Neck ROM: Full    Dental no notable dental hx.    Pulmonary sleep apnea , former smoker,    Pulmonary exam normal breath sounds clear to auscultation       Cardiovascular + Peripheral Vascular Disease  Normal cardiovascular exam+ pacemaker  Rhythm:Regular Rate:Normal     Neuro/Psych negative neurological ROS  negative psych ROS   GI/Hepatic negative GI ROS, Neg liver ROS,   Endo/Other  diabetesMorbid obesity  Renal/GU negative Renal ROS  negative genitourinary   Musculoskeletal negative musculoskeletal ROS (+)   Abdominal   Peds negative pediatric ROS (+)  Hematology negative hematology ROS (+)   Anesthesia Other Findings   Reproductive/Obstetrics negative OB ROS                            Anesthesia Physical Anesthesia Plan  ASA: IV and emergent  Anesthesia Plan: General   Post-op Pain Management:    Induction: Intravenous  Airway Management Planned: LMA and Oral ETT  Additional Equipment:   Intra-op Plan:   Post-operative Plan: Extubation in OR  Informed Consent: I have reviewed the patients History and Physical, chart, labs and discussed the procedure including the risks, benefits and alternatives for the proposed anesthesia with the patient or authorized representative who has indicated his/her understanding and acceptance.   Dental advisory given  Plan Discussed with: CRNA and Surgeon  Anesthesia Plan Comments:         Anesthesia Quick Evaluation

## 2016-04-20 NOTE — Progress Notes (Signed)
Pharmacy Antibiotic Note  Alan Briggs is Alan Briggs 82 y.o. male admitted on 04/20/2016 with sepsis.  Pharmacy has been consulted for aztreonam dosing.  Patient discharged from hospital today on cipro 250 mg po BID for 3 add'l days after receiving IV cipro as inpt for suspected UTI (UCx on previous admit revealed multiple organisms).  Plan: azteronam 1g IV q8h  Weight: (!) 305 lb (138.347 kg)  Temp (24hrs), Avg:98.2 F (36.8 C), Min:98.2 F (36.8 C), Max:98.2 F (36.8 C)   Recent Labs Lab 04/17/16 1650 04/17/16 1651 04/17/16 1724 04/18/16 0602 04/20/16 0130 04/20/16 0150  WBC  --  10.3  --  9.1 16.9*  --   CREATININE 0.99  --  1.00 1.21 3.93*  --   LATICACIDVEN  --   --   --   --   --  2.74*    Estimated Creatinine Clearance: 17.3 mL/min (by C-G formula based on Cr of 3.93).    Allergies  Allergen Reactions  . Advair Diskus [Fluticasone-Salmeterol]     Told by MD not to use  . Combivent [Ipratropium-Albuterol]     Told by MD not to use  . Lotensin Hct [Benazepril-Hydrochlorothiazide]     Told by MD not to use  . Penicillins Swelling  . Simvastatin Other (See Comments)    myalgias    Antimicrobials this admission: 7/4 Vancomycin   >>  7/4 aztreonam >>   Dose adjustments this admission:   Microbiology results: 7/4 BCx: pending 7/4 UCx: ordered  Thank you for allowing pharmacy to be a part of this patient's care.  Drusilla KannerGrimsley, Jaylise Peek Lydia 04/20/2016 3:12 AM

## 2016-04-20 NOTE — ED Provider Notes (Signed)
CSN: [id number]     Arrival date & time 04/20/16  0111 History   First MD Initiated Contact with Patient 04/20/16 0121     Chief Complaint  Patient presents with  . Fall     (Consider location/radiation/quality/duration/timing/severity/associated sxs/prior Treatment) HPI Comments: The patient is a 82 year old male who is morbidly obese with a BMI greater than 40, he was recently admitted to the hospital on July 1 with signs of a possible kidney stone with hydronephrosis and ureteral edema on the left side. There was no obvious kidney stone or lesions seen, it was thought that there could be a possible urinary infection however his urinalysis was inconclusive and the urine culture had multiple species present. He was discharged within the last 12 hours on ciprofloxacin 250 mg twice a day for the next 3 days just in case there was infection. He states that when he was at home where he lives by himself he walked to his bathroom with his walker with great difficulty, when he went to try to sit down he fell to the ground because his legs gave out, he was unable to get up off the ground and eventually the neighbors came and found him after several hours of him laying on the ground by himself. They noted some blood on the floor and some blood coming from his right foot. The patient denies any other complaints other than weakness. The paramedics reported prehospital hypotension and hypoxia, the patient denies shortness of breath or any other complaints other than some pain in his foot. He has severe peripheral neuropathy related to his diabetes which is brittle according to family members. His blood sugar prehospital was 4988  Patient is a 82 y.o. male presenting with fall. The history is provided by the patient and medical records.  Fall    Past Medical History  Diagnosis Date  . Diabetes (HCC)   . Cardiac pacemaker in situ     for sick sinus syndrome-[facility]  . Mixed  hyperlipidemia   . Venous insufficiency (chronic) (peripheral)   . Thrombocytopenia (HCC)   . Sleep apnea   . Pulmonary hypertension (HCC)   . Obstructive lung disease (HCC)     PFT's done 04/22/06   . GERD (gastroesophageal reflux disease)    Past Surgical History  Procedure Laterality Date  . Refractive surgery Right   . Cataract extraction, bilateral Bilateral   . Rotator cuff repair Left   . Kidney stone surgery Left     laser ablation    Family History  Problem Relation Age of Onset  . Diabetes Mother    Social History  Substance Use Topics  . Smoking status: Former Smoker -- 0.50 packs/day for 30 years    Types: Cigarettes    Start date: 10/18/1948    Quit date: 10/18/1978  . Smokeless tobacco: Never Used  . Alcohol Use: None    Review of Systems  All other systems reviewed and are negative.     Allergies  Advair diskus; Combivent; Lotensin hct; Penicillins; and Simvastatin  Home Medications   Prior to Admission medications   Medication Sig Start Date End Date Taking? Authorizing Provider  acetaminophen (TYLENOL) 500 MG tablet Take 500 mg by mouth every 6 (six) hours as needed.    Historical Provider, MD  amLODipine (NORVASC) 5 MG tablet Take 5 mg by mouth daily.    Historical Provider, MD  aspirin EC 81 MG tablet Take 1 tablet by mouth daily.  Historical Provider, MD  atenolol (TENORMIN) 50 MG tablet Take 1 tablet by mouth daily. 05/24/12   Historical Provider, MD  carboxymethylcellulose (REFRESH PLUS) 0.5 % SOLN Place 1 drop into both eyes 3 (three) times daily as needed.    Historical Provider, MD  ciprofloxacin (CIPRO) 250 MG tablet Take 1 tablet (250 mg total) by mouth 2 (two) times daily. 04/19/16   Henderson CloudEstela Y Hernandez Acosta, MD  docusate sodium (COLACE) 100 MG capsule Take 1 capsule (100 mg total) by mouth 2 (two) times daily. 04/19/16   Henderson CloudEstela Y Hernandez Acosta, MD  hydrochlorothiazide (HYDRODIURIL) 25 MG tablet Take 1 tablet by mouth daily. 10/31/11    Historical Provider, MD  insulin glargine (LANTUS) 100 UNIT/ML injection Inject 56 Units into the skin 2 (two) times daily.  03/22/14   Historical Provider, MD  insulin lispro (HUMALOG) 100 UNIT/ML injection Inject 12 Units into the skin 3 (three) times daily before meals.     Historical Provider, MD  losartan (COZAAR) 50 MG tablet Take 1 tablet by mouth daily. 05/01/12   Historical Provider, MD  Multiple Vitamins-Minerals (ICAPS PO) Take 1 capsule by mouth daily.    Historical Provider, MD  Omega-3 1000 MG CAPS Take 1 g by mouth daily.     Historical Provider, MD  polyethylene glycol (MIRALAX / GLYCOLAX) packet Take 17 g by mouth daily. 04/19/16   Henderson CloudEstela Y Hernandez Acosta, MD  rosuvastatin (CRESTOR) 10 MG tablet Take 10 mg by mouth daily.    Historical Provider, MD   BP 95/53 mmHg  Pulse 60  Resp 21  Wt 305 lb (138.347 kg)  SpO2 96% Physical Exam  Constitutional: He appears well-developed and well-nourished. He appears distressed.  HENT:  Head: Normocephalic and atraumatic.  Mouth/Throat: Oropharynx is clear and moist. No oropharyngeal exudate.  Eyes: Conjunctivae and EOM are normal. Pupils are equal, round, and reactive to light. Right eye exhibits no discharge. Left eye exhibits no discharge. No scleral icterus.  Neck: Normal range of motion. Neck supple. No JVD present. No thyromegaly present.  Cardiovascular: Normal rate, regular rhythm and intact distal pulses.  Exam reveals no gallop and no friction rub.   No murmur heard. Distant heart sounds, no tachycardia or JVD  Pulmonary/Chest: Effort normal and breath sounds normal. No respiratory distress. He has no wheezes. He has no rales.  Abdominal: Soft. Bowel sounds are normal. He exhibits no distension and no mass. There is no tenderness.  Morbidly obese but nontender abdomen  Musculoskeletal: Normal range of motion. He exhibits edema and tenderness.  Bilateral pitting edema to the lower extremities, legs are symmetrical, thickening and  crusting of the skin and a chronic pattern, fungal nail infections, right lower extremity with 3 lacerations to the 3 lateral toes on the plantar surface including a partial amputation through the right small toe at the metatarsal phalangeal joint. No active bleeding, visible joint is present  Lymphadenopathy:    He has no cervical adenopathy.  Neurological: He is alert. Coordination normal.  Skin: Skin is warm and dry.  Laceration to the bottom of the 3 lateral right toes  Psychiatric: He has a normal mood and affect. His behavior is normal.  Nursing note and vitals reviewed.   ED Course  Procedures (including critical care time) Labs Review Labs Reviewed  COMPREHENSIVE METABOLIC PANEL - Abnormal; Notable for the following:    Sodium 130 (*)    Chloride 94 (*)    Glucose, Bld 60 (*)    BUN 66 (*)  Creatinine, Ser 3.93 (*)    Calcium 7.8 (*)    Total Protein 6.4 (*)    Albumin 3.0 (*)    GFR calc non Af Amer 12 (*)    GFR calc Af Amer 14 (*)    All other components within normal limits  CBC WITH DIFFERENTIAL/PLATELET - Abnormal; Notable for the following:    WBC 16.9 (*)    RBC 3.93 (*)    MCV 100.8 (*)    RDW 15.7 (*)    Platelets 142 (*)    Neutro Abs 13.7 (*)    Monocytes Absolute 2.3 (*)    All other components within normal limits  URINALYSIS, ROUTINE W REFLEX MICROSCOPIC (NOT AT [facility]) - Abnormal; Notable for the following:    Color, Urine AMBER (*)    Hgb urine dipstick MODERATE (*)    Leukocytes, UA TRACE (*)    All other components within normal limits  BRAIN NATRIURETIC PEPTIDE - Abnormal; Notable for the following:    B Natriuretic Peptide 254.0 (*)    All other components within normal limits  URINE MICROSCOPIC-ADD ON - Abnormal; Notable for the following:    Squamous Epithelial / LPF 0-5 (*)    Bacteria, UA MANY (*)    All other components within normal limits  I-STAT CG4 LACTIC ACID, ED - Abnormal; Notable for the following:    Lactic Acid, Venous 2.74  (*)    All other components within normal limits  CULTURE, BLOOD (ROUTINE X 2)  CULTURE, BLOOD (ROUTINE X 2)  URINE CULTURE  CK  TROPONIN I  I-STAT CG4 LACTIC ACID, ED    Imaging Review Dg Chest Port 1 View  04/20/2016  CLINICAL DATA:  Status post fall, with concern for chest injury. Initial encounter. EXAM: PORTABLE CHEST 1 VIEW COMPARISON:  None. FINDINGS: The lungs are well-aerated. Vascular congestion is noted. Bibasilar airspace opacities may reflect mild interstitial edema. There is no evidence of pleural effusion or pneumothorax. The cardiomediastinal silhouette is mildly enlarged. A pacemaker is noted overlying the left chest wall, with leads ending overlying the right atrium and right ventricle. No acute osseous abnormalities are seen. IMPRESSION: Vascular congestion and mild cardiomegaly. Bibasilar airspace opacities may reflect mild interstitial edema. No displaced rib fracture seen. Electronically Signed   By: Roanna Raider M.D.   On: 04/20/2016 02:28   Dg Foot Complete Right  04/20/2016  CLINICAL DATA:  Status post fall, with laceration at the proximal right foot. Initial encounter. EXAM: RIGHT FOOT COMPLETE - 3+ VIEW COMPARISON:  None. FINDINGS: There is displacement at the fifth proximal interphalangeal joint, which raises concern for dislocation. Would correlate with the patient's symptoms. No definite acute fractures are seen. Diffuse soft tissue swelling is noted about the forefoot and midfoot. The subtalar joint is grossly unremarkable in appearance. The known soft tissue laceration is not well characterized on radiograph. IMPRESSION: Concern for dislocation at the fifth proximal interphalangeal joint. Would correlate with the patient's symptoms. No definite acute fracture seen. Electronically Signed   By: Roanna Raider M.D.   On: 04/20/2016 02:29   I have personally reviewed and evaluated these images and lab results as part of my medical decision-making.   EKG  Interpretation   Date/Time:  Tuesday April 20 2016 01:25:37 EDT Ventricular Rate:  86 PR Interval:    QRS Duration: 153 QT Interval:  425 QTC Calculation: 509 R Axis:   -82 Text Interpretation:  Atrial-ventricular dual-paced rhythm No further  analysis attempted due to paced  rhythm No old tracing to compare Confirmed  by [facility]  MD, Virdia Ziesmer ([id number]) on 04/20/2016 2:06:36 AM      MDM   Final diagnoses:  Shock (HCC)  UTI (lower urinary tract infection)  Acute renal failure, unspecified acute renal failure type (HCC)  Hyponatremia    The patient is consistently hypotensive, pressure of 78/50, he is not tachycardic but does have a low oxygen at 86%. I will obtain an x-ray of his chest, urinalysis, sepsis protocol, IV fluids, x-rays of the foot and the patient will need to be admitted to the hospital again. The reason for the patient's weakness and hypotension is unclear at this time. He is not diaphoretic nauseated and has no chest pain or abdominal pain. His mental status is normal  The patient's blood pressure has remained low, it is currently 81/45, his pulse is around 80, he is afebrile. He has had some organ dysfunction including acute renal failure with a creatinine of 3.9, prior to this it was in the normal range. His BUN is 66, sodium is low, white blood cell counts are significantly elevated and there is evidence of a urinary infection as well. Lactic acid is 2.74. The patient has received 30 mL/kg of IV fluids, antibiotics have been ordered, and reevaluation the patient's mental status is brighter. I will discuss his care with the hospitalist Dr. Sharl Ma, he will need orthopedic consultation given his open joint, there is no orthopedic coverage at this hospital today. The patient does appear critically ill with low blood pressure and white blood cell count.  Repeat sepsis assessment completed.  The pt is to be admitted to [facility] at [facility] - Dr. Maryfrances Bunnell accepted  CRITICAL  CARE Performed by: Vida Roller Total critical care time: 35 minutes Critical care time was exclusive of separately billable procedures and treating other patients. Critical care was necessary to treat or prevent imminent or life-threatening deterioration. Critical care was time spent personally by me on the following activities: development of treatment plan with patient and/or surrogate as well as nursing, discussions with consultants, evaluation of patient's response to treatment, examination of patient, obtaining history from patient or surrogate, ordering and performing treatments and interventions, ordering and review of laboratory studies, ordering and review of radiographic studies, pulse oximetry and re-evaluation of patient's condition.  Meds given in ED:  Medications  sodium chloride 0.9 % bolus 1,000 mL (0 mLs Intravenous Stopped 04/20/16 0254)    And  sodium chloride 0.9 % bolus 1,000 mL (not administered)    And  sodium chloride 0.9 % bolus 1,000 mL (not administered)    And  sodium chloride 0.9 % bolus 1,000 mL (1,000 mLs Intravenous New Bag/Given 04/20/16 0233)    And  sodium chloride 0.9 % bolus 500 mL (not administered)  vancomycin (VANCOCIN) IVPB 1000 mg/200 mL premix (1,000 mg Intravenous New Bag/Given 04/20/16 0400)  sodium chloride 0.9 % bolus 1,000 mL (not administered)  Tdap (BOOSTRIX) injection 0.5 mL (0.5 mLs Intramuscular Given 04/20/16 0401)  sodium chloride 0.9 % bolus 1,000 mL (1,000 mLs Intravenous New Bag/Given 04/20/16 0400)        Eber Hong, MD 04/20/16 [phone]

## 2016-04-21 ENCOUNTER — Inpatient Hospital Stay (HOSPITAL_COMMUNITY): Payer: Medicare Other

## 2016-04-21 ENCOUNTER — Encounter (HOSPITAL_COMMUNITY): Payer: Self-pay | Admitting: Orthopaedic Surgery

## 2016-04-21 DIAGNOSIS — N170 Acute kidney failure with tubular necrosis: Secondary | ICD-10-CM

## 2016-04-21 DIAGNOSIS — S93104D Unspecified dislocation of right toe(s), subsequent encounter: Secondary | ICD-10-CM

## 2016-04-21 LAB — URINE CULTURE: Culture: NO GROWTH

## 2016-04-21 LAB — BASIC METABOLIC PANEL
ANION GAP: 10 (ref 5–15)
BUN: 75 mg/dL — ABNORMAL HIGH (ref 6–20)
CALCIUM: 7.8 mg/dL — AB (ref 8.9–10.3)
CO2: 23 mmol/L (ref 22–32)
Chloride: 100 mmol/L — ABNORMAL LOW (ref 101–111)
Creatinine, Ser: 5.25 mg/dL — ABNORMAL HIGH (ref 0.61–1.24)
GFR, EST AFRICAN AMERICAN: 10 mL/min — AB (ref >60)
GFR, EST NON AFRICAN AMERICAN: 8 mL/min — AB (ref >60)
GLUCOSE: 173 mg/dL — AB (ref 65–99)
Potassium: 5.4 mmol/L — ABNORMAL HIGH (ref 3.5–5.1)
Sodium: 133 mmol/L — ABNORMAL LOW (ref 135–145)

## 2016-04-21 LAB — CBC WITH DIFFERENTIAL/PLATELET
Basophils Absolute: 0 10*3/uL (ref 0.0–0.1)
Basophils Relative: 0 %
EOS PCT: 1 %
Eosinophils Absolute: 0.1 10*3/uL (ref 0.0–0.7)
HCT: 37.6 % — ABNORMAL LOW (ref 39.0–52.0)
Hemoglobin: 11.9 g/dL — ABNORMAL LOW (ref 13.0–17.0)
LYMPHS ABS: 1 10*3/uL (ref 0.7–4.0)
LYMPHS PCT: 10 %
MCH: 32.4 pg (ref 26.0–34.0)
MCHC: 31.6 g/dL (ref 30.0–36.0)
MCV: 102.5 fL — AB (ref 78.0–100.0)
MONOS PCT: 17 %
Monocytes Absolute: 1.6 10*3/uL — ABNORMAL HIGH (ref 0.1–1.0)
Neutro Abs: 7 10*3/uL (ref 1.7–7.7)
Neutrophils Relative %: 72 %
PLATELETS: 125 10*3/uL — AB (ref 150–400)
RBC: 3.67 MIL/uL — AB (ref 4.22–5.81)
RDW: 16.2 % — ABNORMAL HIGH (ref 11.5–15.5)
WBC: 9.8 10*3/uL (ref 4.0–10.5)

## 2016-04-21 LAB — COMPREHENSIVE METABOLIC PANEL
ALK PHOS: 40 U/L (ref 38–126)
ALT: 21 U/L (ref 17–63)
AST: 28 U/L (ref 15–41)
Albumin: 2.3 g/dL — ABNORMAL LOW (ref 3.5–5.0)
Anion gap: 10 (ref 5–15)
BUN: 72 mg/dL — ABNORMAL HIGH (ref 6–20)
CHLORIDE: 98 mmol/L — AB (ref 101–111)
CO2: 25 mmol/L (ref 22–32)
CREATININE: 5.21 mg/dL — AB (ref 0.61–1.24)
Calcium: 7.8 mg/dL — ABNORMAL LOW (ref 8.9–10.3)
GFR, EST AFRICAN AMERICAN: 10 mL/min — AB (ref >60)
GFR, EST NON AFRICAN AMERICAN: 9 mL/min — AB (ref >60)
Glucose, Bld: 132 mg/dL — ABNORMAL HIGH (ref 65–99)
Potassium: 5.5 mmol/L — ABNORMAL HIGH (ref 3.5–5.1)
Sodium: 133 mmol/L — ABNORMAL LOW (ref 135–145)
Total Bilirubin: 0.5 mg/dL (ref 0.3–1.2)
Total Protein: 5 g/dL — ABNORMAL LOW (ref 6.5–8.1)

## 2016-04-21 LAB — HEMOGLOBIN A1C
Hgb A1c MFr Bld: 5.5 % (ref 4.8–5.6)
Mean Plasma Glucose: 111 mg/dL

## 2016-04-21 LAB — GLUCOSE, CAPILLARY
GLUCOSE-CAPILLARY: 146 mg/dL — AB (ref 65–99)
GLUCOSE-CAPILLARY: 121 mg/dL — AB (ref 65–99)
GLUCOSE-CAPILLARY: 148 mg/dL — AB (ref 65–99)
GLUCOSE-CAPILLARY: 187 mg/dL — AB (ref 65–99)
Glucose-Capillary: 158 mg/dL — ABNORMAL HIGH (ref 65–99)

## 2016-04-21 LAB — MAGNESIUM: MAGNESIUM: 2.3 mg/dL (ref 1.7–2.4)

## 2016-04-21 MED ORDER — SODIUM CHLORIDE 0.9 % IV BOLUS (SEPSIS): 1000.0000 mL | Freq: Once | INTRAVENOUS | Status: DC

## 2016-04-21 MED ORDER — SODIUM POLYSTYRENE SULFONATE 15 GM/60ML PO SUSP
30.0000 g | Freq: Once | ORAL | Status: AC
  Administered 2016-04-21: 30 g via ORAL
  Filled 2016-04-21: qty 120

## 2016-04-21 MED ORDER — SODIUM CHLORIDE 0.9 % IV BOLUS (SEPSIS)
250.0000 mL | Freq: Once | INTRAVENOUS | Status: AC
  Administered 2016-04-21: 250 mL via INTRAVENOUS

## 2016-04-21 MED ORDER — TRAMADOL HCL 50 MG PO TABS
50.0000 mg | ORAL_TABLET | Freq: Two times a day (BID) | ORAL | Status: DC | PRN
  Administered 2016-04-21 – 2016-04-25 (×2): 50 mg via ORAL
  Filled 2016-04-21 (×4): qty 1

## 2016-04-21 MED ORDER — ACETAMINOPHEN 325 MG PO TABS
650.0000 mg | ORAL_TABLET | Freq: Four times a day (QID) | ORAL | Status: DC | PRN
  Filled 2016-04-21: qty 2

## 2016-04-21 MED ORDER — HEPARIN SODIUM (PORCINE) 5000 UNIT/ML IJ SOLN
5000.0000 [IU] | Freq: Three times a day (TID) | INTRAMUSCULAR | Status: DC
  Administered 2016-04-21 – 2016-04-30 (×26): 5000 [IU] via SUBCUTANEOUS
  Filled 2016-04-21 (×25): qty 1

## 2016-04-21 MED ORDER — OXYCODONE HCL 5 MG PO TABS
5.0000 mg | ORAL_TABLET | ORAL | Status: DC | PRN
  Administered 2016-04-21 – 2016-04-22 (×5): 5 mg via ORAL
  Filled 2016-04-21 (×6): qty 1

## 2016-04-21 MED ORDER — SODIUM CHLORIDE 0.9 % IV SOLN
INTRAVENOUS | Status: DC
  Administered 2016-04-21: 19:00:00 via INTRAVENOUS
  Administered 2016-04-21: 125 mL/h via INTRAVENOUS
  Administered 2016-04-22 – 2016-04-23 (×2): via INTRAVENOUS

## 2016-04-21 NOTE — Evaluation (Signed)
Physical Therapy Evaluation Patient Details Name: Alan CollumRobert C Briggs MRN: [id number] DOB: 1922/02/23 Today's Date: 04/21/2016   History of Present Illness  82 y.o. M Hx DM2, HTN, Pulmonary Hypertension, Sick Sinus Syndrome S/P Permanent Pacemaker 09/2007, OSA, Nephrolithiasis with Hydronephrosis and L Ureteral edema discharged from the hospital 7/3 on antibiotics for possible UTI, urine culture grew multiple species. It was suggested that the pt be d/c to SNF, but due to reported financial issues the pt opted for d/c home instead. Patient got up to walk to the bathroom with his walker and fell to the ground because his legs gave out. His neighbors came and found him after several hours of laying on the ground w/ a laceration of the right foot. Underwent fixation of the right 5th toe on 04/20/16  Clinical Impression  Pt admitted with above diagnosis. Pt currently with functional limitations due to the deficits listed below (see PT Problem List). Pt unable to move within bed or get out of bed at this point, +2 mod A for bed mobility and to sit up at EOB. Had difficulty maintaining sitting EOB and unable to attempt standing at this point due to generalized weakness and SOB with all exertion.  Pt will benefit from skilled PT to increase their independence and safety with mobility to allow discharge to the venue listed below.       Follow Up Recommendations SNF    Equipment Recommendations  None recommended by PT    Recommendations for Other Services       Precautions / Restrictions Precautions Precautions: Fall Precaution Comments: pt reports multiple falls at home with knees giving way but so far they have all been onto a safe surface, ie, bed, chair, etc with no serious injury Restrictions Weight Bearing Restrictions: Yes RLE Weight Bearing: Partial weight bearing RLE Partial Weight Bearing Percentage or Pounds: no wt specified but heel only      Mobility  Bed Mobility Overal bed mobility:  Needs Assistance Bed Mobility: Supine to Sit;Sit to Supine     Supine to sit: +2 for physical assistance;Mod assist Sit to supine: Max assist;+2 for physical assistance   General bed mobility comments: even with HOB elevated, pt required mod A to move each leg, mod A +2 at trunk to roll left and mod A +2 for elevation of trunk to sitting. Pt remained on 2L O2 throughout and 3/4 DOE once EOB with O2 sats 86%  Transfers                 General transfer comment: pt unable to attempt standing yet, had difficulty even maintaining sitting EOB  Ambulation/Gait             General Gait Details: unable at this time  Stairs            Wheelchair Mobility    Modified Rankin (Stroke Patients Only)       Balance Overall balance assessment: Needs assistance;History of Falls Sitting-balance support: Bilateral upper extremity supported Sitting balance-Leahy Scale: Poor Sitting balance - Comments: pt could not let go of EOB in sitting and still required min-guard for safety to prevent posterior LOB Postural control: Posterior lean     Standing balance comment: unable                             Pertinent Vitals/Pain Pain Assessment: Faces Faces Pain Scale: Hurts little more Pain Location: groans with movement of all extremities  but denies pain right foot Pain Descriptors / Indicators: Aching;Sore Pain Intervention(s): Limited activity within patient's tolerance;Monitored during session;Premedicated before session         O2 sats 86% with all exertion on 2L O2  Home Living Family/patient expects to be discharged to:: Skilled nursing facility Living Arrangements: Alone Available Help at Discharge: Family Type of Home: House Home Access: Stairs to enter Entrance Stairs-Rails: Left Entrance Stairs-Number of Steps: 4-5 Home Layout: One level Home Equipment: Walker - 2 wheels;Cane - single point Additional Comments: pt has been independent but increasingly  SOB, [facility] to [facility] day before he fell with SOB and apparently took O2 home but had not been on it before    Prior Function Level of Independence: Independent with assistive device(s)         Comments: PTA driving, preparing light meals, ate mostly outside of home, limited standing tolerance, and limited walking tolerance.      Hand Dominance        Extremity/Trunk Assessment   Upper Extremity Assessment: Generalized weakness;RUE deficits/detail;LUE deficits/detail;Defer to OT evaluation RUE Deficits / Details: h/o torn RTC bilaterally, repaired on one side and not the other, difficulty raising arms and reaching across body to roll in bed     LUE Deficits / Details: see RUE note   Lower Extremity Assessment: Generalized weakness;RLE deficits/detail;LLE deficits/detail RLE Deficits / Details: pt describes RLE as his "stronger leg" before falling, but admits that both knees give way. On eval, hip flex 2-/5, knee ext 3/5, hip abd/ add 2-/5  LLE Deficits / Details: hip flex 1/5, knee ext 3-/5, hip abd/ add 2-/5  Cervical / Trunk Assessment: Kyphotic  Communication   Communication: No difficulties;HOH  Cognition Arousal/Alertness: Awake/alert Behavior During Therapy: WFL for tasks assessed/performed Overall Cognitive Status: Within Functional Limits for tasks assessed                      General Comments      Exercises General Exercises - Lower Extremity Ankle Circles/Pumps: AROM;Both;10 reps;Seated Long Arc Quad: AROM;Both;10 reps;Seated      Assessment/Plan    PT Assessment Patient needs continued PT services  PT Diagnosis Difficulty walking;Generalized weakness   PT Problem List Decreased strength;Decreased activity tolerance;Decreased balance;Decreased mobility;Decreased knowledge of precautions;Obesity;Cardiopulmonary status limiting activity;Impaired sensation  PT Treatment Interventions DME instruction;Gait training;Functional mobility  training;Therapeutic activities;Therapeutic exercise;Balance training;Patient/family education   PT Goals (Current goals can be found in the Care Plan section) Acute Rehab PT Goals Patient Stated Goal: Return to home, regain strength.  PT Goal Formulation: With patient Time For Goal Achievement: 05/05/16 Potential to Achieve Goals: Fair    Frequency Min 3X/week   Barriers to discharge Inaccessible home environment;Decreased caregiver support 4-5 STE and lives alone    Co-evaluation               End of Session Equipment Utilized During Treatment: Oxygen Activity Tolerance: Patient limited by fatigue Patient left: in bed;with call bell/phone within reach Nurse Communication: Mobility status         Time: [id number] PT Time Calculation (min) (ACUTE ONLY): 30 min   Charges:   PT Evaluation $PT Eval Moderate Complexity: 1 Procedure PT Treatments $Therapeutic Activity: 8-22 mins   PT G Codes:       Lyanne CoVictoria Johnnell Liou, PT  Acute Rehab Services  [phone]  [place], [place] 04/21/2016, 11:02 AM

## 2016-04-21 NOTE — Progress Notes (Signed)
OT Cancellation Note  Patient Details Name: Alan CollumRobert C Briggs MRN: [id number] DOB: 03/06/1922   Cancelled Treatment:    Reason Eval/Treat Not Completed: Pt with other provider.  Will reattempt   Angelene GiovanniConarpe, Daveion Robar M  Jaion Lagrange [place] Park[place]arpe, OTR/L [phone]  04/21/2016, 4:08 PM

## 2016-04-21 NOTE — Clinical Social Work Note (Signed)
Clinical Social Work Assessment  Patient Details  Name: Alan Briggs MRN: [id number] Date of Birth: Mar 04, 1922  Date of referral:  04/21/16               Reason for consult:  Facility Placement                Permission sought to share information with:  Facility Sport and exercise psychologist, Family Supports Permission granted to share information::  Yes, Verbal Permission Granted  Name::     Chief Strategy Officer::  SNFs  Relationship::  Children  Contact Information:     Housing/Transportation Living arrangements for the past 2 months:  Single Family Home Source of Information:  Patient, Adult Children Patient Interpreter Needed:  None Criminal Activity/Legal Involvement Pertinent to Current Situation/Hospitalization:  No - Comment as needed Significant Relationships:  Adult Children Lives with:  Self Do you feel safe going back to the place where you live?  No Need for family participation in patient care:  Yes (Comment)  Care giving concerns:  CSW received referral for possible SNF placement at time of discharge. CSW met with patient and patient's sons at bedside regarding PT recommendation of SNF placement at time of discharge. Per patient's sons, patient has been to several SNFs before and is currently unable to care for himself at home alone given patient's current physical needs and fall risk. Patient and patient's sons expressed understanding of PT recommendation and are agreeable to SNF placement at time of discharge. CSW to continue to follow and assist with discharge planning needs.   Social Worker assessment / plan:  CSW spoke with patient and patient's sons concerning possibility of rehab at [facility] before returning home.  Employment status:  Retired Forensic scientist:  Medicare PT Recommendations:  [place] / Referral to community resources:  [place]  Patient/Family's Response to care:  Patient and patient's sons recognize need for  rehab before returning home and are agreeable to a SNF. Patient reported preference for [facility]. Last time patient was in hospital he did not meet Medicare requirements for SNF, but patient reports that he needs SNF this time.   Patient/Family's Understanding of and Emotional Response to Diagnosis, Current Treatment, and Prognosis:  Patient is realistic regarding therapy needs. No questions/concerns about plan or treatment.    Emotional Assessment Appearance:  Appears stated age Attitude/Demeanor/Rapport:  Other (Appropriate) Affect (typically observed):  Accepting, Appropriate Orientation:  Oriented to Self, Oriented to Place, Oriented to  Time, Oriented to Situation Alcohol / Substance use:  Not Applicable Psych involvement (Current and /or in the community):  No (Comment)  Discharge Needs  Concerns to be addressed:  Care Coordination Readmission within the last 30 days:  Yes Current discharge risk:  None Barriers to Discharge:  Continued Medical Work up   [employer], [place] 04/21/2016, 4:14 PM

## 2016-04-21 NOTE — Progress Notes (Signed)
   Subjective:  Patient reports pain as mild.    Objective:   VITALS:   Filed Vitals:   04/21/16 0800 04/21/16 0838 04/21/16 1150 04/21/16 1531  BP: 101/56 101/56 96/47 111/55  Pulse: 59 58 60 65  Temp:  98.6 F (37 C) 98.4 F (36.9 C)   TempSrc:  Oral Oral   Resp: 16 19 23 24   Height:      Weight:      SpO2: 91% 92% 91% 92%    Neurologically intact Neurovascular intact Sensation intact distally Intact pulses distally Dorsiflexion/Plantar flexion intact Incision: dressing C/D/I and no drainage No cellulitis present Compartment soft   Lab Results  Component Value Date   WBC 9.8 04/21/2016   HGB 11.9* 04/21/2016   HCT 37.6* 04/21/2016   MCV 102.5* 04/21/2016   PLT 125* 04/21/2016     Assessment/Plan:  1 Day Post-Op   - Expected postop acute blood loss anemia - will monitor for symptoms - Up with PT/OT - DVT ppx - SCDs, ambulation, lovenox - WBAT to heel only, postop shoe -   Cheral AlmasXu, Naiping Michael 04/21/2016, 4:44 PM [phone]

## 2016-04-21 NOTE — Progress Notes (Signed)
Pt has refused CPAP for the night.  RT to monitor and assess as needed.  

## 2016-04-21 NOTE — [place] FL2 (Signed)
[place] MEDICAID FL2 LEVEL OF CARE SCREENING TOOL     IDENTIFICATION  Patient Name: Cora CollumRobert C Matteucci Birthdate: [birth year] Sex: male Admission Date (Current Location): 04/20/2016  [facility] [place] [place]  [place]   Facility and Address:  [facility], [address], [place], [place] [address]      Provider Number: [id number]  Attending Physician Name and Address:  Lonia BloodJeffrey T McClung, MD  Relative Name and Phone Number:  Greer PickerelRobert Jr, [phone]    Current Level of Care: Hospital Recommended Level of Care: Skilled Nursing Facility Prior Approval Number:    Date Approved/Denied:   PASRR Number: [id number]  Discharge Plan: SNF    Current Diagnoses: Patient Active Problem List   Diagnosis Date Noted  . Sepsis (HCC) 04/20/2016  . Pancreatic mass   . Sepsis, unspecified organism (HCC)   . Chronic obstructive pulmonary disease (HCC)   . OSA (obstructive sleep apnea)   . Controlled diabetes mellitus type 2 with complications (HCC)   . Sick sinus syndrome (HCC)   . Presence of permanent cardiac pacemaker   . Foot laceration   . Dislocation of fifth toe, right, closed   . Pancreatic lesion 04/18/2016  . Abdominal pain 04/17/2016  . UTI (lower urinary tract infection) 04/17/2016  . Pacemaker 09/30/2015  . Chronic diastolic heart failure (HCC) 09/30/2015  . Venous insufficiency 09/30/2015    Orientation RESPIRATION BLADDER Height & Weight     Self, Time, Situation, Place  Normal Continent, Indwelling catheter (Urethral catheter) Weight: (!) 145 kg (319 lb 10.7 oz) Height:  6' (182.9 cm)  BEHAVIORAL SYMPTOMS/MOOD NEUROLOGICAL BOWEL NUTRITION STATUS   (N/A)   Continent Diet (Please see DC Summary)  AMBULATORY STATUS COMMUNICATION OF NEEDS Skin   Extensive Assist Verbally Surgical wounds, Other (Comment) (Closed incision on foot; Open wound on leg)                       Personal Care Assistance Level of Assistance  Bathing, Feeding,  Dressing Bathing Assistance: Maximum assistance Feeding assistance: Limited assistance Dressing Assistance: Maximum assistance     Functional Limitations Info  Hearing   Hearing Info: Impaired      SPECIAL CARE FACTORS FREQUENCY  PT (By licensed PT)     PT Frequency: 5x/week              Contractures      Additional Factors Info  Code Status, Allergies, Insulin Sliding Scale Code Status Info: DNR Allergies Info: Advair Diskus, Combivent, Lotensin Hct, Penicillins, Simvastatin   Insulin Sliding Scale Info: insulin aspart (novoLOG) injection 0-9 Units       Current Medications (04/21/2016):  This is the current hospital active medication list Current Facility-Administered Medications  Medication Dose Route Frequency Provider Last Rate Last Dose  . 0.9 %  sodium chloride infusion   Intravenous Continuous Lonia BloodJeffrey T McClung, MD 125 mL/hr at 04/21/16 0941 125 mL/hr at 04/21/16 0941  . acetaminophen (TYLENOL) tablet 650 mg  650 mg Oral Q6H PRN Lonia BloodJeffrey T McClung, MD      . aspirin EC tablet 81 mg  81 mg Oral Daily Meredeth IdeGagan S Lama, MD   81 mg at 04/21/16 1015  . clindamycin (CLEOCIN) IVPB 600 mg  600 mg Intravenous Q8H Naiping Donnelly StagerM Xu, MD   600 mg at 04/21/16 1528  . heparin injection 5,000 Units  5,000 Units Subcutaneous Q8H Lonia BloodJeffrey T McClung, MD   5,000 Units at 04/21/16 1527  . insulin aspart (  novoLOG) injection 0-9 Units  0-9 Units Subcutaneous TID WC Meredeth IdeGagan S Lama, MD   1 Units at 04/21/16 1251  . ondansetron (ZOFRAN) tablet 4 mg  4 mg Oral Q6H PRN Meredeth IdeGagan S Lama, MD       Or  . ondansetron (ZOFRAN) injection 4 mg  4 mg Intravenous Q6H PRN Meredeth IdeGagan S Lama, MD      . oxyCODONE (Oxy IR/ROXICODONE) immediate release tablet 5 mg  5 mg Oral Q4H PRN Lonia BloodJeffrey T McClung, MD   5 mg at 04/21/16 1537  . traMADol (ULTRAM) tablet 50 mg  50 mg Oral Q12H PRN Lonia BloodJeffrey T McClung, MD         Discharge Medications: Please see discharge summary for a list of discharge medications.  Relevant Imaging  Results:  Relevant Lab Results:   Additional Information SSN: [id number]  Mekhi Sonn S Homeland[place], [place]

## 2016-04-21 NOTE — Progress Notes (Signed)
[facility] TEAM 1 - Stepdown/ICU TEAM  Alan Briggs  YQM:[id number] DOB: 10/28/1921 DOA: 7/4/[name]2017 PCP: Dwana MelenaZack Hall, MD   Brief Narrative:  82 y.o. M Hx DM2, HTN, Pulmonary Hypertension, Sick Sinus Syndrome S/P Permanent Pacemaker 09/2007, OSA, Nephrolithiasis with Hydronephrosis and L Ureteral edema discharged from the hospital 7/3 on antibiotics for possible UTI, urine culture grew multiple species. It was suggested that the pt be d/c to SNF, but due to reported financial issues the pt opted for d/c home instead.  Patient got up to walk to the bathroom with his walker and fell to the ground because his legs gave out.  His neighbors came and found him after several hours of laying on the ground w/ a laceration of the right foot.  In the ED patient was found to be hypotensive and prehospital blood glucose was 88.  Assessment & Plan:   Foot laceration/dislocation vs fractured Rt fifth proximal interphalangeal joint -Dr. Oneita JollyXu [facility] took pt for formal I&D, complex wound repair, and fixation of R 5th toe - care per Ortho   Severe acute renal failure  crt has been steadily climbing since his re-admit - likely pre-renal +/- ATN w/ pt down on ground for hours - CK not markedly elevated at admit - carefully hydrate and follow   Recent Labs Lab 04/17/16 1650 04/17/16 1724 04/18/16 0602 04/20/16 0130 04/20/16 0649 04/21/16 0600  CREATININE 0.99 1.00 1.21 3.93* 3.25* 5.21*   Hyperkalemia Due to renal failure - kayexalate today to be proactive - follow   SIRS - not likely Sepsis - no evidence of UTI  -no indication for ongoing abx for this diagnosis   LVH -noted on TTE this admit w/ preserved EF (50-55%) Filed Weights   04/20/16 0113 04/20/16 0600 04/21/16 0500  Weight: 138.347 kg (305 lb) 145.7 kg (321 lb 3.4 oz) 145 kg (319 lb 10.7 oz)    Sick Sinus Syndrome S/P pacemaker  COPD -Patient is supposed to use home O2 but is noncompliant  -Titrate O2 to maintain SPO2  89-93%  OSA -CPAP per respiratory therapy  DM2 - Hypoglycemia  -7/1 A1c 5.5 - hypoglycemia resolved - follow  Macrocytosis -Check B-12 and folic acid levels  3.8 cm round lesion in the pancreatic tail -noted incidentally on CT renal study 04/17/16 - pancreatic protocol CT abdom suggested when able - imaging requires contrast and is therefore not appropriate presently due to renal failure   DVT prophylaxis: SQ heparin  Code Status: DO NOT RESUSCITATE Family Communication: No family present at time of exam today Disposition Plan: SDU   Consultants:  Dr.Naiping Donnelly StagerM Xu [facility]    Procedures/Significant Events:  7/1 CT renal stone survey - 3.8 cm round lesion in the pancreatic tail. -May saccular aneurysm of the splenic artery or a primary pancreatic cyst or neoplasm.-. Probable cyst interpolar right kidney  Antimicrobials: Aztreonam 7/4 > 7/5 Clindamycin 7/4 >   Subjective: The patient complains of low back pain of a musculoskeletal type description.  He denies flank pain or pain consistent with nephrolithiasis.  He denies shortness of breath or chest pain.  He is tired of being in bed but was not able to get up with physical therapy today.   Objective: Filed Vitals:   04/21/16 0500 04/21/16 0600 04/21/16 0800 04/21/16 0838  BP:  107/54 101/56 101/56  Pulse: 64 62 59 58  Temp:    98.6 F (37 C)  TempSrc:    Oral  Resp: 17 14 16  19  Height:      Weight: 145 kg (319 lb 10.7 oz)     SpO2: 95% 92% 91% 92%    Intake/Output Summary (Last 24 hours) at 04/21/16 0916 Last data filed at 04/21/16 0700  Gross per 24 hour  Intake   3385 ml  Output      3 ml  Net   3382 ml   Filed Weights   04/20/16 0113 04/20/16 0600 04/21/16 0500  Weight: 138.347 kg (305 lb) 145.7 kg (321 lb 3.4 oz) 145 kg (319 lb 10.7 oz)    Examination: General: No acute respiratory distress Lungs: Clear to auscultation bilaterally without wheezes or crackles - poor air movement bilateral  bases Cardiovascular: Regular rate and rhythm without murmur gallop or rub normal S1 and S2 Abdomen: Nontender, protuberant/overweight, soft, bowel sounds positive, no rebound, no ascites, no appreciable mass Extremities: No significant cyanosis or clubbing; 1+ edema bilateral lower extremities   CBC:  Recent Labs Lab 04/17/16 1651 04/17/16 1724 04/18/16 0602 04/20/16 0130 04/20/16 0649 04/21/16 0600  WBC 10.3  --  9.1 16.9* 13.6* 9.8  NEUTROABS 6.8  --   --  13.7*  --  7.0  HGB 14.5 15.0 13.9 13.2 12.5* 11.9*  HCT 43.4 44.0 43.1 39.6 38.9* 37.6*  MCV 99.8  --  102.1* 100.8* 101.6* 102.5*  PLT 129*  --  117* 142* 124* 125*   Basic Metabolic Panel:  Recent Labs Lab 04/17/16 1724 04/18/16 0602 04/20/16 0130 04/20/16 0649 04/21/16 0600  NA 136 134* 130* 133* 133*  K 3.9 4.2 5.0 4.6 5.5*  CL 95* 96* 94* 98* 98*  CO2  --  30 27 26 25   GLUCOSE 97 121* 60* 83 132*  BUN 22* 27* 66* 57* 72*  CREATININE 1.00 1.21 3.93* 3.25* 5.21*  CALCIUM  --  8.3* 7.8* 7.7* 7.8*  MG  --   --   --   --  2.3   GFR: Estimated Creatinine Clearance: 13.4 mL/min (by C-G formula based on Cr of 5.21).   Liver Function Tests:  Recent Labs Lab 04/18/16 0602 04/20/16 0130 04/20/16 0649 04/21/16 0600  AST 29 29 34 28  ALT 24 23 21 21   ALKPHOS 50 45 40 40  BILITOT 0.9 1.0 0.7 0.5  PROT 6.2* 6.4* 5.5* 5.0*  ALBUMIN 3.3* 3.0* 2.6* 2.3*   Cardiac Enzymes:  Recent Labs Lab 04/20/16 0130  CKTOTAL 368  TROPONINI <0.03   HbA1C:  Recent Labs  04/20/16 0649  HGBA1C 5.5   CBG:  Recent Labs Lab 04/20/16 1143 04/20/16 1334 04/20/16 1646 04/20/16 2309 04/21/16 0837  GLUCAP 65 118* 107* 114* 121*   Urine analysis:    Component Value Date/Time   COLORURINE AMBER* 04/20/2016 0130   APPEARANCEUR CLEAR 04/20/2016 0130   LABSPEC 1.025 04/20/2016 0130   PHURINE 5.0 04/20/2016 0130   GLUCOSEU NEGATIVE 04/20/2016 0130   HGBUR MODERATE* 04/20/2016 0130   BILIRUBINUR NEGATIVE  04/20/2016 0130   KETONESUR NEGATIVE 04/20/2016 0130   PROTEINUR NEGATIVE 04/20/2016 0130   NITRITE NEGATIVE 04/20/2016 0130   LEUKOCYTESUR TRACE* 04/20/2016 0130    Recent Results (from the past 240 hour(s))  Urine culture     Status: Abnormal   Collection Time: 04/17/16  5:16 PM  Result Value Ref Range Status   Specimen Description URINE, RANDOM  Final   Special Requests Normal  Final   Culture MULTIPLE SPECIES PRESENT, SUGGEST RECOLLECTION (A)  Final   Report Status 04/19/2016 FINAL  Final  Blood Culture (  routine x 2)     Status: None (Preliminary result)   Collection Time: 04/20/16  2:00 AM  Result Value Ref Range Status   Specimen Description BLOOD RIGHT ANTECUBITAL  Final   Special Requests   Final    BOTTLES DRAWN AEROBIC AND ANAEROBIC AEB 12CC ANA 10CC   Culture NO GROWTH 1 DAY  Final   Report Status PENDING  Incomplete  Blood Culture (routine x 2)     Status: None (Preliminary result)   Collection Time: 04/20/16  2:15 AM  Result Value Ref Range Status   Specimen Description BLOOD RIGHT FOREARM  Final   Special Requests BOTTLES DRAWN AEROBIC AND ANAEROBIC 10CC EACH  Final   Culture NO GROWTH 1 DAY  Final   Report Status PENDING  Incomplete  MRSA PCR Screening     Status: None   Collection Time: 04/20/16  6:12 AM  Result Value Ref Range Status   MRSA by PCR NEGATIVE NEGATIVE Final    Comment:        The GeneXpert MRSA Assay (FDA approved for NASAL specimens only), is one component of a comprehensive MRSA colonization surveillance program. It is not intended to diagnose MRSA infection nor to guide or monitor treatment for MRSA infections.     Scheduled Meds: . aspirin EC  81 mg Oral Daily  . aztreonam  1 g Intravenous Q8H  . clindamycin (CLEOCIN) IV  600 mg Intravenous Q8H  . dextrose  25 mL Intravenous Once  . enoxaparin (LOVENOX) injection  40 mg Subcutaneous Q24H  . insulin aspart  0-9 Units Subcutaneous TID WC  . sodium chloride  1,000 mL Intravenous  Once   And  . sodium chloride  1,000 mL Intravenous Once   And  . sodium chloride  500 mL Intravenous Once  . sodium chloride  1,000 mL Intravenous Once     LOS: 1 day    Time spent: 35 minutes  Lonia Blood, MD [facility] Office  [phone] Pager - Text Page per Loretha Stapler as per below:  On-Call/Text Page:      [email or web address]      password [email or web address]  If 7PM-7AM, please contact night-coverage [email or web address] Password [email or web address] 04/21/2016, 9:17 AM

## 2016-04-22 DIAGNOSIS — N179 Acute kidney failure, unspecified: Secondary | ICD-10-CM | POA: Diagnosis present

## 2016-04-22 DIAGNOSIS — N390 Urinary tract infection, site not specified: Secondary | ICD-10-CM

## 2016-04-22 LAB — CBC
HCT: 37.5 % — ABNORMAL LOW (ref 39.0–52.0)
HEMOGLOBIN: 12.2 g/dL — AB (ref 13.0–17.0)
MCH: 32.9 pg (ref 26.0–34.0)
MCHC: 32.5 g/dL (ref 30.0–36.0)
MCV: 101.1 fL — ABNORMAL HIGH (ref 78.0–100.0)
Platelets: 129 10*3/uL — ABNORMAL LOW (ref 150–400)
RBC: 3.71 MIL/uL — AB (ref 4.22–5.81)
RDW: 15.5 % (ref 11.5–15.5)
WBC: 9.4 10*3/uL (ref 4.0–10.5)

## 2016-04-22 LAB — RENAL FUNCTION PANEL
ALBUMIN: 2.2 g/dL — AB (ref 3.5–5.0)
ANION GAP: 7 (ref 5–15)
BUN: 54 mg/dL — ABNORMAL HIGH (ref 6–20)
CALCIUM: 8 mg/dL — AB (ref 8.9–10.3)
CO2: 26 mmol/L (ref 22–32)
Chloride: 104 mmol/L (ref 101–111)
Creatinine, Ser: 2.51 mg/dL — ABNORMAL HIGH (ref 0.61–1.24)
GFR, EST AFRICAN AMERICAN: 24 mL/min — AB (ref >60)
GFR, EST NON AFRICAN AMERICAN: 21 mL/min — AB (ref >60)
Glucose, Bld: 156 mg/dL — ABNORMAL HIGH (ref 65–99)
PHOSPHORUS: 4.5 mg/dL (ref 2.5–4.6)
Potassium: 4.7 mmol/L (ref 3.5–5.1)
SODIUM: 137 mmol/L (ref 135–145)

## 2016-04-22 LAB — GLUCOSE, CAPILLARY
GLUCOSE-CAPILLARY: 167 mg/dL — AB (ref 65–99)
GLUCOSE-CAPILLARY: 165 mg/dL — AB (ref 65–99)
Glucose-Capillary: 175 mg/dL — ABNORMAL HIGH (ref 65–99)
Glucose-Capillary: 171 mg/dL — ABNORMAL HIGH (ref 65–99)

## 2016-04-22 LAB — FOLATE: FOLATE: 12.3 ng/mL (ref >5.9)

## 2016-04-22 LAB — VITAMIN B12: Vitamin B-12: 150 pg/mL — ABNORMAL LOW (ref 180–914)

## 2016-04-22 MED ORDER — MAGNESIUM CITRATE PO SOLN
1.0000 | Freq: Once | ORAL | Status: AC
  Administered 2016-04-22: 1 via ORAL
  Filled 2016-04-22: qty 296

## 2016-04-22 MED ORDER — PROMETHAZINE HCL 25 MG/ML IJ SOLN
12.5000 mg | Freq: Once | INTRAMUSCULAR | Status: AC
  Administered 2016-04-22: 12.5 mg via INTRAVENOUS
  Filled 2016-04-22: qty 1

## 2016-04-22 NOTE — Progress Notes (Signed)
Pt refuses CPAP QHS.  RT to monitor and assess as needed.  

## 2016-04-22 NOTE — Progress Notes (Signed)
Physical Therapy Treatment Patient Details Name: Alan Briggs MRN: [id number] DOB: Apr 29, 1922 Today's Date: 04/22/2016    History of Present Illness 82 y.o. M Hx DM2, HTN, Pulmonary Hypertension, Sick Sinus Syndrome S/P Permanent Pacemaker 09/2007, OSA, Nephrolithiasis with Hydronephrosis and L Ureteral edema discharged from the hospital 7/3 on antibiotics for possible UTI, urine culture grew multiple species. It was suggested that the pt be d/c to SNF, but due to reported financial issues the pt opted for d/c home instead. Patient got up to walk to the bathroom with his walker and fell to the ground because his legs gave out. His neighbors came and found him after several hours of laying on the ground w/ a laceration of the right foot. Underwent fixation of the right 5th toe on 04/20/16    PT Comments    [name] is making very slow progress, was limited by fatigue/lethargy today.  Alan DecSara was utilized today w/ max +2 assist for transfer to chair.  Pt will benefit from continued skilled PT services to increase functional independence and safety.   Follow Up Recommendations  SNF     Equipment Recommendations  Other (comment) (TBD at next venue of care)    Recommendations for Other Services       Precautions / Restrictions Precautions Precautions: Fall Precaution Comments: pt reports multiple falls at home with knees giving way but so far they have all been onto a safe surface, ie, bed, chair, etc with no serious injury Restrictions Weight Bearing Restrictions: Yes RLE Weight Bearing: Weight bearing as tolerated RLE Partial Weight Bearing Percentage or Pounds: through heel only with post op shoe in place     Mobility  Bed Mobility Overal bed mobility: Needs Assistance Bed Mobility: Supine to Sit;Sit to Supine     Supine to sit: Max assist;+2 for physical assistance     General bed mobility comments: Pt required assist with all aspects. He is able to assist minimally with moving  LEs off EOB and lifting trunk.  Pt was very lethargic   Transfers Overall transfer level: Needs assistance   Transfers: Sit to/from Stand;Stand Pivot Transfers Sit to Stand: Max assist;+2 physical assistance Stand pivot transfers: Max assist;+2 physical assistance       General transfer comment: With bed height elevated, pt moved sit to stand with max A +2 and use of Alan Briggs.  Pt demonstrates difficulty adducting LEs, and requires assist for this and with lifting hips from bed.  LE buckled x 1 during transfer.    Ambulation/Gait             General Gait Details: unable to safely assess at this time   Stairs            Wheelchair Mobility    Modified Rankin (Stroke Patients Only)       Balance Overall balance assessment: Needs assistance Sitting-balance support: Feet supported;Bilateral upper extremity supported Sitting balance-Leahy Scale: Poor Sitting balance - Comments: Pt requires UE support    Standing balance support: Bilateral upper extremity supported;During functional activity Standing balance-Leahy Scale: Poor Standing balance comment: Relies on lift equipment Alan Briggs(Alan Briggs) to stand                    Cognition Arousal/Alertness: Lethargic Behavior During Therapy: Flat affect Overall Cognitive Status: Difficult to assess                      Exercises      General Comments General comments (  skin integrity, edema, etc.): HR up to 151, but RA lead off, so unsure if accurate reading.  BP 123/50 sitting EOB and 120/41 sitting in recliner chair at end of sesison.      Pertinent Vitals/Pain Pain Assessment: Faces Faces Pain Scale: Hurts little more Pain Location: back and Rt LE Pain Descriptors / Indicators: Grimacing;Guarding;Moaning Pain Intervention(s): Limited activity within patient's tolerance;Monitored during session;Repositioned    Home Living Family/patient expects to be discharged to:: Skilled nursing facility Living  Arrangements: Alone Available Help at Discharge: Family Type of Home: House Home Access: Stairs to enter Entrance Stairs-Rails: Left Home Layout: One level Home Equipment: Environmental consultantWalker - 2 wheels;Cane - single point      Prior Function Level of Independence: Independent with assistive device(s)      Comments: PTA driving, preparing light meals, ate mostly outside of home, limited standing tolerance, and limited walking tolerance.    PT Goals (current goals can now be found in the care plan section) Acute Rehab PT Goals Patient Stated Goal: to get stronger  PT Goal Formulation: With patient/family Time For Goal Achievement: 05/05/16 Potential to Achieve Goals: Fair Progress towards PT goals: Progressing toward goals (very modestly)    Frequency  Min 3X/week    PT Plan Current plan remains appropriate    Co-evaluation   Reason for Co-Treatment: For patient/therapist safety   OT goals addressed during session: ADL's and self-care     End of Session Equipment Utilized During Treatment: Gait belt;Oxygen;Other (comment) Alan Briggs(Alan Briggs lift) Activity Tolerance: Patient limited by fatigue;Patient limited by lethargy;Patient limited by pain Patient left: in chair;with call bell/phone within reach;with chair alarm set;with family/visitor present     Time: [id number] PT Time Calculation (min) (ACUTE ONLY): 29 min  Charges:  $Therapeutic Activity: 8-22 mins                    G Codes:       Alan ChuAshley Angelica Briggs PT, DPT  Pager: [phone] Phone: [phone] 04/22/2016, 2:22 PM

## 2016-04-22 NOTE — Progress Notes (Signed)
PROGRESS NOTE    Alan Briggs  ZOX:[id number] DOB: 1922-10-15 DOA: 04/20/2016 PCP: [name], MD   Brief Narrative:  Alan Briggs is a 82 y.o. WM PMHx DM Type 2 uncontrolled with complication, HTN, Pulmonary Hypertension, Sick Sinus Syndrome S/P Permanent Pacemaker 09/2007, OSA,Obstructive lung disease , Nephrolithiasis with Hydronephrosis and Ureteral edema on left side  Just discharged from the hospital 7/3.  Patient was sent home on antibiotics for possible UTI, urine culture grew multiple species. Patient lives by himself today when he got up to walk to the bathroom with his walker and he tried to sit down on the commode he fell to the ground because legs gave out. Patient was unable to get up from the ground and neighbors came and found him after several hours of laying on the ground by himself. Patient was found to have laceration in the right foot. He denies chest pain or shortness of breath. In the ED patient was found to be hypotensive and prehospital blood glucose was 88.   Assessment & Plan:   Active Problems:   Sepsis (HCC)   Pancreatic mass   Sepsis, unspecified organism (HCC)   Chronic obstructive pulmonary disease (HCC)   OSA (obstructive sleep apnea)   Controlled diabetes mellitus type 2 with complications (HCC)   Sick sinus syndrome (HCC)   Presence of permanent cardiac pacemaker   Foot laceration   Dislocation of fifth toe, right, closed   Acute renal failure (HCC)  Sepsis/Foot laceration/dislocated vs fractured Rt fifth proximal interphalangeal joint -Dr.Naiping Donnelly [facility] plans to take patient for formal I&D and perc pinning -Continue current antibiotics which will cover foot and UTI  Sepsis unspecified organism/UTI?  -normal saline 116ml/hr  Severe acute renal failure  crt has been steadily climbing since his re-admit - likely pre-renal +/- ATN w/ pt down on ground for hours - CK not markedly elevated at admit - carefully hydrate and follow   Lab Results  Component Value Date   CREATININE 2.51* 04/22/2016   CREATININE 5.25* 04/21/2016   CREATININE 5.21* 04/21/2016    Sick Sinus Syndrome S/P pacemaker -Unknown cardiac status, Echocardiogram pending -Strict in and outSince admission +2.7 L -Daily weight Filed Weights   04/20/16 0600 04/21/16 0500 04/22/16 0500  Weight: 145.7 kg (321 lb 3.4 oz) 145 kg (319 lb 10.7 oz) 145.151 kg (320 lb)   Obstructive lung disease -Patient is supposed to use home O2, per patient has been refusing -Titrate O2 to maintain SPO2 89-93%  OSA - CPAP per respiratory therapy  Diabetes mellitus controlled with complication -7/1 hemoglobin A1c= 5.5 -See sepsis -Sensitive SSI  Pancreatic mass(3.8 cm round lesion in the pancreatic tail) -noted incidentally on CT renal study 04/17/16 - pancreatic protocol CT abdom suggested when able - imaging requires contrast and is therefore not appropriate presently due to renal failure - Constipation -Patient had multiple doses of lactulose yesterday for his hyperkalemia which has resolved but patient still has not had BM. -Soap suds enema     DVT prophylaxis: Lovenox Code Status: DO NOT RESUSCITATE Family Communication: Son present at bedside Disposition Plan: Per orthopedic surgery   Consultants:  Dr.Naiping Donnelly [facility]    Procedures/Significant Events:  7/4 Right foot x-ray:There is displacement at the fifth proximal interphalangeal joint,dislocation?.  7/1 CT renal stone survey;- 3.8 cm round lesion in the pancreatic tail. -May saccular aneurysm of the splenic artery or a primary pancreatic cyst or neoplasm.-. Probable cyst interpolar right kidney.    Cultures  7/1 urine positive multiple species 7/4 urine Negative 7/4 blood right AC/forearm NGTD 7/4 MRSA by PCR negative   Antimicrobials: Aztreonam 7/4>>7/5 Clindamycin 7/4>>   Devices NA   LINES / TUBES:  NA    Continuous Infusions: . sodium chloride 125  mL/hr at 04/21/16 1915     Subjective: 7/6  A/O 4,. States Very uncomfortable secondary to not having BM and last 3 days. Son states patient has chronic constipation.      Objective: Filed Vitals:   04/22/16 1000 04/22/16 1200 04/22/16 1244 04/22/16 1627  BP: 124/54 123/50 120/41 139/59  Pulse: 62 64 57 58  Temp:   98.5 F (36.9 C) 98.1 F (36.7 C)  TempSrc:   Oral Oral  Resp: 14 17 16 21   Height:      Weight:      SpO2: 96% 96% 97% 97%    Intake/Output Summary (Last 24 hours) at 04/22/16 1923 Last data filed at 04/22/16 1756  Gross per 24 hour  Intake 4151.25 ml  Output   4050 ml  Net 101.25 ml   Filed Weights   04/20/16 0600 04/21/16 0500 04/22/16 0500  Weight: 145.7 kg (321 lb 3.4 oz) 145 kg (319 lb 10.7 oz) 145.151 kg (320 lb)    Examination:  General: A/O 4, positive pain to right foot, No acute respiratory distress Eyes: negative scleral hemorrhage, negative anisocoria, negative icterus ENT: Negative Runny nose, negative gingival bleeding, Neck:  Negative scars, masses, torticollis, lymphadenopathy, JVD Lungs: Clear to auscultation bilaterally without wheezes or crackles Cardiovascular: Regular rate and rhythm without murmur gallop or rub normal S1 and S2 Abdomen: Morbidly obese, negative abdominal pain, nondistended, positive soft, bowel sounds, no rebound, no ascites, no appreciable mass Extremities: bilateral pedal edema Rt>>Lt right foot bleeding, fifth metatarsal displaced.,  Skin: Negative rashes, lesions, ulcers Psychiatric:  Negative depression, negative anxiety, negative fatigue, negative mania  Central nervous system:  Cranial nerves II through XII intact, tongue/uvula midline, all extremities muscle strength 5/5, sensation intact throughout, negative dysarthria, negative expressive aphasia, negative receptive aphasia.  .     Data Reviewed: Care during the described time interval was provided by me .  I have reviewed this patient's available  data, including medical history, events of note, physical examination, and all test results as part of my evaluation. I have personally reviewed and interpreted all radiology studies.  CBC:  Recent Labs Lab 04/17/16 1651  04/18/16 0602 04/20/16 0130 04/20/16 0649 04/21/16 0600 04/22/16 0400  WBC 10.3  --  9.1 16.9* 13.6* 9.8 9.4  NEUTROABS 6.8  --   --  13.7*  --  7.0  --   HGB 14.5  < > 13.9 13.2 12.5* 11.9* 12.2*  HCT 43.4  < > 43.1 39.6 38.9* 37.6* 37.5*  MCV 99.8  --  102.1* 100.8* 101.6* 102.5* 101.1*  PLT 129*  --  117* 142* 124* 125* 129*  < > = values in this interval not displayed. Basic Metabolic Panel:  Recent Labs Lab 04/20/16 0130 04/20/16 0649 04/21/16 0600 04/21/16 1456 04/22/16 0400  NA 130* 133* 133* 133* 137  K 5.0 4.6 5.5* 5.4* 4.7  CL 94* 98* 98* 100* 104  CO2 27 26 25 23 26   GLUCOSE 60* 83 132* 173* 156*  BUN 66* 57* 72* 75* 54*  CREATININE 3.93* 3.25* 5.21* 5.25* 2.51*  CALCIUM 7.8* 7.7* 7.8* 7.8* 8.0*  MG  --   --  2.3  --   --   PHOS  --   --   --   --  4.5   GFR: Estimated Creatinine Clearance: 27.8 mL/min (by C-G formula based on Cr of 2.51). Liver Function Tests:  Recent Labs Lab 04/18/16 0602 04/20/16 0130 04/20/16 0649 04/21/16 0600 04/22/16 0400  AST 29 29 34 28  --   ALT 24 23 21 21   --   ALKPHOS 50 45 40 40  --   BILITOT 0.9 1.0 0.7 0.5  --   PROT 6.2* 6.4* 5.5* 5.0*  --   ALBUMIN 3.3* 3.0* 2.6* 2.3* 2.2*   No results for input(s): LIPASE, AMYLASE in the last 168 hours. No results for input(s): AMMONIA in the last 168 hours. Coagulation Profile: No results for input(s): INR, PROTIME in the last 168 hours. Cardiac Enzymes:  Recent Labs Lab 04/20/16 0130  CKTOTAL 368  TROPONINI <0.03   BNP (last 3 results) No results for input(s): PROBNP in the last 8760 hours. HbA1C:  Recent Labs  04/20/16 0649  HGBA1C 5.5   CBG:  Recent Labs Lab 04/21/16 1715 04/21/16 2116 04/22/16 0720 04/22/16 1245 04/22/16 1626   GLUCAP 158* 187* 167* 175* 171*   Lipid Profile: No results for input(s): CHOL, HDL, LDLCALC, TRIG, CHOLHDL, LDLDIRECT in the last 72 hours. Thyroid Function Tests: No results for input(s): TSH, T4TOTAL, FREET4, T3FREE, THYROIDAB in the last 72 hours. Anemia Panel:  Recent Labs  04/22/16 0400  VITAMINB12 150*  FOLATE 12.3   Urine analysis:    Component Value Date/Time   COLORURINE AMBER* 04/20/2016 0130   APPEARANCEUR CLEAR 04/20/2016 0130   LABSPEC 1.025 04/20/2016 0130   PHURINE 5.0 04/20/2016 0130   GLUCOSEU NEGATIVE 04/20/2016 0130   HGBUR MODERATE* 04/20/2016 0130   BILIRUBINUR NEGATIVE 04/20/2016 0130   KETONESUR NEGATIVE 04/20/2016 0130   PROTEINUR NEGATIVE 04/20/2016 0130   NITRITE NEGATIVE 04/20/2016 0130   LEUKOCYTESUR TRACE* 04/20/2016 0130   Sepsis Labs: @LABRCNTIP (procalcitonin:4,lacticidven:4)  ) Recent Results (from the past 240 hour(s))  Urine culture     Status: Abnormal   Collection Time: 04/17/16  5:16 PM  Result Value Ref Range Status   Specimen Description URINE, RANDOM  Final   Special Requests Normal  Final   Culture MULTIPLE SPECIES PRESENT, SUGGEST RECOLLECTION (A)  Final   Report Status 04/19/2016 FINAL  Final  Urine culture     Status: None   Collection Time: 04/20/16  1:30 AM  Result Value Ref Range Status   Specimen Description URINE, CLEAN CATCH  Final   Special Requests NONE  Final   Culture NO GROWTH Performed at [facility]   Final   Report Status 04/21/2016 FINAL  Final  Blood Culture (routine x 2)     Status: None (Preliminary result)   Collection Time: 04/20/16  2:00 AM  Result Value Ref Range Status   Specimen Description BLOOD RIGHT ANTECUBITAL  Final   Special Requests   Final    BOTTLES DRAWN AEROBIC AND ANAEROBIC AEB 12CC ANA 10CC   Culture NO GROWTH 1 DAY  Final   Report Status PENDING  Incomplete  Blood Culture (routine x 2)     Status: None (Preliminary result)   Collection Time: 04/20/16  2:15 AM   Result Value Ref Range Status   Specimen Description BLOOD RIGHT FOREARM  Final   Special Requests BOTTLES DRAWN AEROBIC AND ANAEROBIC 10CC EACH  Final   Culture NO GROWTH 1 DAY  Final   Report Status PENDING  Incomplete  MRSA PCR Screening     Status: None   Collection Time: 04/20/16  6:12  AM  Result Value Ref Range Status   MRSA by PCR NEGATIVE NEGATIVE Final    Comment:        The GeneXpert MRSA Assay (FDA approved for NASAL specimens only), is one component of a comprehensive MRSA colonization surveillance program. It is not intended to diagnose MRSA infection nor to guide or monitor treatment for MRSA infections.          Radiology Studies: [place] Renal  04/21/2016  CLINICAL DATA:  Acute renal failure. EXAM: RENAL / URINARY TRACT ULTRASOUND COMPLETE COMPARISON:  CT 04/17/2016. FINDINGS: Right Kidney: Length: 13.2 cm. Echogenicity within normal limits. No mass, previously identified cyst not identified. Moderate right hydronephrosis visualized. Left Kidney: Length: 12.8 cm. Echogenicity within normal limits. No mass or hydronephrosis visualized. Bladder: Appears normal for degree of bladder distention. Exam limited due to patient's body habitus, breathing, and bowel gas. IMPRESSION: Limited exam.  Moderate right hydronephrosis. Electronically Signed   By: Maisie Fushomas  Register   On: 04/21/2016 11:33        Scheduled Meds: . aspirin EC  81 mg Oral Daily  . clindamycin (CLEOCIN) IV  600 mg Intravenous Q8H  . heparin subcutaneous  5,000 Units Subcutaneous Q8H  . insulin aspart  0-9 Units Subcutaneous TID WC   Continuous Infusions: . sodium chloride 125 mL/hr at 04/21/16 1915     LOS: 2 days    Time spent: 40 minutes    Kaylea Mounsey, Roselind MessierURTIS J, MD [facility] Pager [phone]   If 7PM-7AM, please contact night-coverage [email or web address] Password [email or web address] 04/22/2016, 7:23 PM

## 2016-04-22 NOTE — Progress Notes (Signed)
Soap suds enema given to patient, patient unable to retain any of the enema. No stool noted after enema. MD Joseph ArtWoods paged, Mag citrate X1 ordered. Will continue to monitor patient. Luther Parodyaitlin Lum KeasFoecking,RN

## 2016-04-22 NOTE — Evaluation (Signed)
Occupational Therapy Evaluation Patient Details Name: Alan CollumRobert C Briggs MRN: [id number] DOB: May 17, 1922 Today's Date: 04/22/2016    History of Present Illness 82 y.o. M Hx DM2, HTN, Pulmonary Hypertension, Sick Sinus Syndrome S/P Permanent Pacemaker 09/2007, OSA, Nephrolithiasis with Hydronephrosis and L Ureteral edema discharged from the hospital 7/3 on antibiotics for possible UTI, urine culture grew multiple species. It was suggested that the pt be d/c to SNF, but due to reported financial issues the pt opted for d/c home instead. Patient got up to walk to the bathroom with his walker and fell to the ground because his legs gave out. His neighbors came and found him after several hours of laying on the ground w/ a laceration of the right foot. Underwent fixation of the right 5th toe on 04/20/16   Clinical Impression   Pt admitted with above. He demonstrates the below listed deficits and will benefit from continued OT to maximize safety and independence with BADLs.  Pt presents to OT with generalized weakness, and decreased activity tolerance, as well as acute/chronic pain.  He requires max - total A for ADLs.  He was able to transfer to recliner with use of Sara lift and max A +2.  Recommend SNF.       Follow Up Recommendations  SNF    Equipment Recommendations  None recommended by OT    Recommendations for Other Services       Precautions / Restrictions Precautions Precautions: Fall Precaution Comments: pt reports multiple falls at home with knees giving way but so far they have all been onto a safe surface, ie, bed, chair, etc with no serious injury Restrictions Weight Bearing Restrictions: Yes RLE Weight Bearing: Weight bearing as tolerated RLE Partial Weight Bearing Percentage or Pounds: through heel only with post op shoe in place       Mobility Bed Mobility Overal bed mobility: Needs Assistance Bed Mobility: Supine to Sit;Sit to Supine     Supine to sit: Max assist;+2 for  physical assistance     General bed mobility comments: Pt required assist with all aspects. He is able to assist minimally with moving LEs off EOB and lifting trunk.  Pt was very lethargic during eval   Transfers Overall transfer level: Needs assistance   Transfers: Sit to/from Stand;Stand Pivot Transfers Sit to Stand: Max assist;+2 physical assistance Stand pivot transfers: Max assist;+2 physical assistance       General transfer comment: With bed height elevated, pt moved sit to stand with max A +2 and use of Sara.  Pt demonstrates difficulty adducting LEs, and requires assist for this and with lifting hips from bed.  LE buckled x 1 during transfer.      Balance Overall balance assessment: Needs assistance Sitting-balance support: Feet supported;Bilateral upper extremity supported Sitting balance-Leahy Scale: Poor Sitting balance - Comments: Pt requires UE support    Standing balance support: Bilateral upper extremity supported Standing balance-Leahy Scale: Poor Standing balance comment: Requires use of Huntley DecSara to stand                             ADL Overall ADL's : Needs assistance/impaired Eating/Feeding: Independent;Bed level;Sitting   Grooming: Wash/dry hands;Wash/dry face;Oral care;Brushing hair;Moderate assistance;Sitting Grooming Details (indicate cue type and reason): due to lethargy  Upper Body Bathing: Maximal assistance   Lower Body Bathing: Total assistance;Bed level   Upper Body Dressing : Maximal assistance;Sitting   Lower Body Dressing: Total assistance;Bed level   Toilet  Transfer: Maximal assistance;+2 for physical assistance;BSC (with use of Huntley Dec lift )   Toileting- Architect and Hygiene: Total assistance;Sit to/from stand       Functional mobility during ADLs: Maximal assistance;+2 for physical assistance General ADL Comments: Pt limited due to lethargy      Vision     Perception     Praxis      Pertinent Vitals/Pain  Pain Assessment: Faces Faces Pain Scale: Hurts little more Pain Location: back and Rt LE  Pain Descriptors / Indicators: Grimacing;Guarding;Moaning Pain Intervention(s): Monitored during session;Repositioned;Limited activity within patient's tolerance     Hand Dominance Right   Extremity/Trunk Assessment Upper Extremity Assessment Upper Extremity Assessment: Generalized weakness RUE Deficits / Details: h/o torn RTC bilaterally, repaired on one side and not the other, difficulty raising arms and reaching across body to roll in bed   Lower Extremity Assessment Lower Extremity Assessment: Defer to PT evaluation   Cervical / Trunk Assessment Cervical / Trunk Assessment: Kyphotic   Communication Communication Communication: No difficulties;HOH   Cognition Arousal/Alertness: Lethargic Behavior During Therapy: Flat affect Overall Cognitive Status: Difficult to assess                     General Comments       Exercises       Shoulder Instructions      Home Living Family/patient expects to be discharged to:: Skilled nursing facility Living Arrangements: Alone Available Help at Discharge: Family Type of Home: House Home Access: Stairs to enter Secretary/administrator of Steps: 4-5 Entrance Stairs-Rails: Left Home Layout: One level               Home Equipment: Walker - 2 wheels;Cane - single point          Prior Functioning/Environment Level of Independence: Independent with assistive device(s)        Comments: PTA driving, preparing light meals, ate mostly outside of home, limited standing tolerance, and limited walking tolerance.     OT Diagnosis: Generalized weakness;Acute pain   OT Problem List: Decreased strength;Decreased activity tolerance;Impaired balance (sitting and/or standing);Decreased safety awareness;Decreased knowledge of use of DME or AE;Decreased knowledge of precautions;Obesity;Pain   OT Treatment/Interventions: Self-care/ADL  training;Therapeutic exercise;DME and/or AE instruction;Therapeutic activities;Patient/family education;Balance training    OT Goals(Current goals can be found in the care plan section) Acute Rehab OT Goals Patient Stated Goal: to get stronger  OT Goal Formulation: With patient/family Time For Goal Achievement: 05/06/16 Potential to Achieve Goals: Good ADL Goals Pt Will Perform Grooming: with set-up;sitting Pt Will Perform Upper Body Bathing: with set-up;sitting Pt Will Perform Lower Body Bathing: with mod assist;with adaptive equipment;sit to/from stand Pt Will Transfer to Toilet: with mod assist;stand pivot transfer;bedside commode Pt Will Perform Toileting - Clothing Manipulation and hygiene: with mod assist;sit to/from stand Pt/caregiver will Perform Home Exercise Program: Increased strength;Right Upper extremity;Left upper extremity;With theraband;With Supervision;With written HEP provided  OT Frequency: Min 2X/week   Barriers to D/C: Decreased caregiver support          Co-evaluation PT/OT/SLP Co-Evaluation/Treatment: Yes Reason for Co-Treatment: For patient/therapist safety   OT goals addressed during session: ADL's and self-care      End of Session Equipment Utilized During Treatment: Gait belt;Other (comment) Huntley Dec Plus ) Nurse Communication: Mobility status;Need for lift equipment  Activity Tolerance: Patient limited by lethargy Patient left: in chair;with call bell/phone within reach;with chair alarm set;with family/visitor present   Time: 9604-5409 OT Time Calculation (min): 31 min Charges:  OT General  Charges $OT Visit: 1 Procedure OT Evaluation $OT Eval Moderate Complexity: 1 Procedure G-Codes:    Alan Briggs M 04/22/2016, 12:53 PM

## 2016-04-22 NOTE — Care Management Important Message (Signed)
Important Message  Patient Details  Name: Alan Briggs MRN: [id number] Date of Birth: 20-Nov-1921   Medicare Important Message Given:  Yes    Bernadette HoitShoffner, Jannetta Massey Coleman 04/22/2016, 10:20 AM

## 2016-04-23 ENCOUNTER — Inpatient Hospital Stay (HOSPITAL_COMMUNITY): Payer: Medicare Other

## 2016-04-23 DIAGNOSIS — K5900 Constipation, unspecified: Secondary | ICD-10-CM

## 2016-04-23 LAB — RENAL FUNCTION PANEL
ALBUMIN: 2 g/dL — AB (ref 3.5–5.0)
Anion gap: 4 — ABNORMAL LOW (ref 5–15)
BUN: 37 mg/dL — AB (ref 6–20)
CO2: 28 mmol/L (ref 22–32)
Calcium: 7.7 mg/dL — ABNORMAL LOW (ref 8.9–10.3)
Chloride: 107 mmol/L (ref 101–111)
Creatinine, Ser: 1.06 mg/dL (ref 0.61–1.24)
GFR calc Af Amer: >60 mL/min (ref >60)
GFR calc non Af Amer: 59 mL/min — ABNORMAL LOW (ref >60)
GLUCOSE: 155 mg/dL — AB (ref 65–99)
PHOSPHORUS: 2.7 mg/dL (ref 2.5–4.6)
POTASSIUM: 5.1 mmol/L (ref 3.5–5.1)
SODIUM: 139 mmol/L (ref 135–145)

## 2016-04-23 LAB — GLUCOSE, CAPILLARY
GLUCOSE-CAPILLARY: 156 mg/dL — AB (ref 65–99)
Glucose-Capillary: 160 mg/dL — ABNORMAL HIGH (ref 65–99)
Glucose-Capillary: 175 mg/dL — ABNORMAL HIGH (ref 65–99)
Glucose-Capillary: 144 mg/dL — ABNORMAL HIGH (ref 65–99)

## 2016-04-23 MED ORDER — METOCLOPRAMIDE HCL 5 MG/ML IJ SOLN
5.0000 mg | Freq: Four times a day (QID) | INTRAMUSCULAR | Status: DC
  Administered 2016-04-23 – 2016-04-24 (×4): 5 mg via INTRAVENOUS
  Filled 2016-04-23 (×4): qty 2

## 2016-04-23 MED ORDER — MORPHINE SULFATE (PF) 2 MG/ML IV SOLN
1.0000 mg | Freq: Once | INTRAVENOUS | Status: AC
  Administered 2016-04-23: 1 mg via INTRAVENOUS
  Filled 2016-04-23: qty 1

## 2016-04-23 MED ORDER — BISACODYL 10 MG RE SUPP
10.0000 mg | Freq: Once | RECTAL | Status: AC
  Administered 2016-04-23: 10 mg via RECTAL
  Filled 2016-04-23: qty 1

## 2016-04-23 MED ORDER — CYANOCOBALAMIN 1000 MCG/ML IJ SOLN
1000.0000 ug | Freq: Every day | INTRAMUSCULAR | Status: AC
  Administered 2016-04-23 – 2016-04-29 (×7): 1000 ug via SUBCUTANEOUS
  Filled 2016-04-23 (×7): qty 1

## 2016-04-23 MED ORDER — SORBITOL 70 % SOLN
30.0000 mL | Freq: Every day | Status: DC
  Administered 2016-04-23: 30 mL via ORAL
  Filled 2016-04-23 (×2): qty 30

## 2016-04-23 MED ORDER — ACETAMINOPHEN 650 MG RE SUPP: 650.0000 mg | RECTAL | Status: DC | PRN

## 2016-04-23 MED ORDER — FLEET ENEMA 7-19 GM/118ML RE ENEM
1.0000 | ENEMA | Freq: Once | RECTAL | Status: AC
  Administered 2016-04-23: 1 via RECTAL
  Filled 2016-04-23: qty 1

## 2016-04-23 NOTE — Progress Notes (Signed)
Patient refuses CPAP. RT to monitor as needed.

## 2016-04-23 NOTE — Progress Notes (Signed)
RN attempted to digitally disimpact patient's rectum. Small clumps of hard blood streaked stool was removed. Pt stated having some relief at this time and wanting another suppository later on. Burnadette PeterLynch, NP notified.

## 2016-04-23 NOTE — Progress Notes (Signed)
Fleet enema administered. No bowel movement at this time. Patient complaining of significant abdominal pain. Burnadette PeterLynch, NP notified. Morphine administered. [facility]

## 2016-04-23 NOTE — Progress Notes (Signed)
[facility] TEAM 1 - Stepdown/ICU TEAM  KAHLEB MCCLANE  ZOX:[id number] DOB: 05-May-1922 DOA: 04/20/2016 PCP: [name], MD   Brief Narrative:  82 y.o. M Hx DM2, HTN, Pulmonary Hypertension, SSS S/P Permanent Pacemaker 09/2007, OSA, Nephrolithiasis with Hydronephrosis and L Ureteral edema discharged from the hospital 7/3 on antibiotics for possible UTI, urine culture grew multiple species. It was suggested that the pt be d/c to SNF, but due to reported financial issues the pt opted for d/c home instead.  Patient got up to walk to the bathroom with his walker and fell to the ground because his legs gave out.  His neighbors came and found him after several hours of laying on the ground w/ a laceration of the right foot.  In the ED patient was found to be hypotensive and prehospital blood glucose was 88.  Assessment & Plan:   Foot laceration/dislocation w/ fractured Rt fifth proximal interphalangeal joint -[facility] took pt for formal I&D, complex wound repair, and fixation of R 5th toe - care per Ortho   Constipation  A chronic issue that is becoming acutely worse - the patient has developed significant nausea and inability to tolerate oral intake today - check KUB - if ileus or PSBO NG will need to be placed - stimulate bowels - follow  Severe acute renal failure  crt climbed after re-admit - urinary retention was the etiology w/ >1.5L removed after foley cath placed - renal fxn has now normalized   Recent Labs Lab 04/20/16 0130 04/20/16 0649 04/21/16 0600 04/21/16 1456 04/22/16 0400 04/23/16 0816  CREATININE 3.93* 3.25* 5.21* 5.25* 2.51* 1.06   Hyperkalemia Due to renal failure - corrected   SIRS - not likely Sepsis - no evidence of UTI  -no indication for ongoing abx for this diagnosis - SIRS physiology has resolved   LVH -noted on TTE this admit w/ preserved EF (50-55%) Filed Weights   04/21/16 0500 04/22/16 0500 04/23/16 0400  Weight: 145 kg (319 lb 10.7 oz)  145.151 kg (320 lb) 141.885 kg (312 lb 12.8 oz)    Sick Sinus Syndrome S/P pacemaker  COPD -Patient is supposed to use home O2 but is noncompliant  -Titrate O2 to maintain SPO2 89-93%  OSA -pt has been noncompliant with/refusing CPAP  DM2 - Hypoglycemia  -7/1 A1c 5.5 - hypoglycemia resolved - follow w/o change today   B12 deficiency - Macrocytosis -replace w/ 7 day SQ load of B12 - will need to be followed up as outpt after trial or oral replacement   3.8 cm round lesion in the pancreatic tail -noted incidentally on CT renal study 04/17/16 - pancreatic protocol CT abdom suggested when able - imaging requires contrast and is therefore not appropriate presently due to renal failure - if renal fxn remains stable could schedule on 7/8  DVT prophylaxis: SQ heparin  Code Status: DO NOT RESUSCITATE Family Communication: spoke w/ son at bedside  Disposition Plan: SDU   Consultants:  Dr.Naiping Donnelly [facility]    Procedures/Significant Events:  7/1 CT renal stone survey - 3.8 cm round lesion in the pancreatic tail. -May saccular aneurysm of the splenic artery or a primary pancreatic cyst or neoplasm.-. Probable cyst interpolar right kidney  Antimicrobials: Aztreonam 7/4 > 7/5 Clindamycin 7/4 >  Subjective: The patient complains of unrelenting nausea and severe constipation causing diffuse abdominal crampy pain.  He denies vomiting but is not able to tolerate oral intake because of his severe nausea.  He denies  shortness of breath or chest pain.  He denies any significant pain in his right foot.   Objective: Filed Vitals:   04/22/16 2300 04/23/16 0355 04/23/16 0400 04/23/16 0831  BP: 133/56  118/52 130/61  Pulse: 67   61  Temp: 97.4 F (36.3 C) 97.8 F (36.6 C)  97.6 F (36.4 C)  TempSrc: Axillary Axillary  Oral  Resp: 20  12 21   Height:      Weight:   141.885 kg (312 lb 12.8 oz)   SpO2: 96%  94% 94%    Intake/Output Summary (Last 24 hours) at 04/23/16  1033 Last data filed at 04/23/16 0600  Gross per 24 hour  Intake 2541.67 ml  Output   3300 ml  Net -758.33 ml   Filed Weights   04/21/16 0500 04/22/16 0500 04/23/16 0400  Weight: 145 kg (319 lb 10.7 oz) 145.151 kg (320 lb) 141.885 kg (312 lb 12.8 oz)    Examination: General: No acute respiratory distress - alert and conversant Lungs: Clear to auscultation bilaterally without wheezes or crackles Cardiovascular: Regular rate and rhythm without murmur gallop or rub  Abdomen: Nontender, protuberant/overweight, soft, bowel sounds positive, no rebound, no ascites, no appreciable mass Extremities: No significant cyanosis or clubbing; trace edema bilateral lower extremities   CBC:  Recent Labs Lab 04/17/16 1651  04/18/16 0602 04/20/16 0130 04/20/16 0649 04/21/16 0600 04/22/16 0400  WBC 10.3  --  9.1 16.9* 13.6* 9.8 9.4  NEUTROABS 6.8  --   --  13.7*  --  7.0  --   HGB 14.5  < > 13.9 13.2 12.5* 11.9* 12.2*  HCT 43.4  < > 43.1 39.6 38.9* 37.6* 37.5*  MCV 99.8  --  102.1* 100.8* 101.6* 102.5* 101.1*  PLT 129*  --  117* 142* 124* 125* 129*  < > = values in this interval not displayed. Basic Metabolic Panel:  Recent Labs Lab 04/20/16 0649 04/21/16 0600 04/21/16 1456 04/22/16 0400 04/23/16 0816  NA 133* 133* 133* 137 139  K 4.6 5.5* 5.4* 4.7 5.1  CL 98* 98* 100* 104 107  CO2 26 25 23 26 28   GLUCOSE 83 132* 173* 156* 155*  BUN 57* 72* 75* 54* 37*  CREATININE 3.25* 5.21* 5.25* 2.51* 1.06  CALCIUM 7.7* 7.8* 7.8* 8.0* 7.7*  MG  --  2.3  --   --   --   PHOS  --   --   --  4.5 2.7   GFR: Estimated Creatinine Clearance: 65 mL/min (by C-G formula based on Cr of 1.06).   Liver Function Tests:  Recent Labs Lab 04/18/16 0602 04/20/16 0130 04/20/16 0649 04/21/16 0600 04/22/16 0400 04/23/16 0816  AST 29 29 34 28  --   --   ALT 24 23 21 21   --   --   ALKPHOS 50 45 40 40  --   --   BILITOT 0.9 1.0 0.7 0.5  --   --   PROT 6.2* 6.4* 5.5* 5.0*  --   --   ALBUMIN 3.3* 3.0*  2.6* 2.3* 2.2* 2.0*   Cardiac Enzymes:  Recent Labs Lab 04/20/16 0130  CKTOTAL 368  TROPONINI <0.03   CBG:  Recent Labs Lab 04/22/16 0720 04/22/16 1245 04/22/16 1626 04/22/16 2123 04/23/16 0822  GLUCAP 167* 175* 171* 165* 160*    Recent Results (from the past 240 hour(s))  Urine culture     Status: Abnormal   Collection Time: 04/17/16  5:16 PM  Result Value Ref Range Status  Specimen Description URINE, RANDOM  Final   Special Requests Normal  Final   Culture MULTIPLE SPECIES PRESENT, SUGGEST RECOLLECTION (A)  Final   Report Status 04/19/2016 FINAL  Final  Urine culture     Status: None   Collection Time: 04/20/16  1:30 AM  Result Value Ref Range Status   Specimen Description URINE, CLEAN CATCH  Final   Special Requests NONE  Final   Culture NO GROWTH [facility]   Final   Report Status 04/21/2016 FINAL  Final  Blood Culture (routine x 2)     Status: None (Preliminary result)   Collection Time: 04/20/16  2:00 AM  Result Value Ref Range Status   Specimen Description BLOOD RIGHT ANTECUBITAL  Final   Special Requests   Final    BOTTLES DRAWN AEROBIC AND ANAEROBIC AEB 12CC ANA 10CC   Culture NO GROWTH 3 DAYS  Final   Report Status PENDING  Incomplete  Blood Culture (routine x 2)     Status: None (Preliminary result)   Collection Time: 04/20/16  2:15 AM  Result Value Ref Range Status   Specimen Description BLOOD RIGHT FOREARM  Final   Special Requests BOTTLES DRAWN AEROBIC AND ANAEROBIC 10CC EACH  Final   Culture NO GROWTH 3 DAYS  Final   Report Status PENDING  Incomplete  MRSA PCR Screening     Status: None   Collection Time: 04/20/16  6:12 AM  Result Value Ref Range Status   MRSA by PCR NEGATIVE NEGATIVE Final    Comment:        The GeneXpert MRSA Assay (FDA approved for NASAL specimens only), is one component of a comprehensive MRSA colonization surveillance program. It is not intended to diagnose MRSA infection nor to guide  or monitor treatment for MRSA infections.     Scheduled Meds: . aspirin EC  81 mg Oral Daily  . clindamycin (CLEOCIN) IV  600 mg Intravenous Q8H  . cyanocobalamin  1,000 mcg Subcutaneous Daily  . heparin subcutaneous  5,000 Units Subcutaneous Q8H  . insulin aspart  0-9 Units Subcutaneous TID WC     LOS: 3 days    Lonia BloodJeffrey T. Jhaden Pizzuto, MD [facility] Office  [phone] Pager - Text Page per Loretha StaplerAmion as per below:  On-Call/Text Page:      [email or web address]      password [email or web address]  If 7PM-7AM, please contact night-coverage [email or web address] Password [email or web address] 04/23/2016, 10:33 AM

## 2016-04-24 ENCOUNTER — Inpatient Hospital Stay (HOSPITAL_COMMUNITY): Payer: Medicare Other

## 2016-04-24 DIAGNOSIS — K913 Postprocedural intestinal obstruction: Secondary | ICD-10-CM

## 2016-04-24 DIAGNOSIS — K5641 Fecal impaction: Secondary | ICD-10-CM

## 2016-04-24 LAB — RENAL FUNCTION PANEL
ANION GAP: 9 (ref 5–15)
Albumin: 2.2 g/dL — ABNORMAL LOW (ref 3.5–5.0)
BUN: 29 mg/dL — ABNORMAL HIGH (ref 6–20)
CALCIUM: 8.2 mg/dL — AB (ref 8.9–10.3)
CO2: 31 mmol/L (ref 22–32)
Chloride: 104 mmol/L (ref 101–111)
Creatinine, Ser: 0.97 mg/dL (ref 0.61–1.24)
GFR calc non Af Amer: >60 mL/min (ref >60)
Glucose, Bld: 160 mg/dL — ABNORMAL HIGH (ref 65–99)
PHOSPHORUS: 2.5 mg/dL (ref 2.5–4.6)
POTASSIUM: 6.1 mmol/L — AB (ref 3.5–5.1)
SODIUM: 144 mmol/L (ref 135–145)

## 2016-04-24 LAB — GLUCOSE, CAPILLARY
Glucose-Capillary: 161 mg/dL — ABNORMAL HIGH (ref 65–99)
Glucose-Capillary: 158 mg/dL — ABNORMAL HIGH (ref 65–99)
Glucose-Capillary: 152 mg/dL — ABNORMAL HIGH (ref 65–99)
Glucose-Capillary: 161 mg/dL — ABNORMAL HIGH (ref 65–99)

## 2016-04-24 MED ORDER — MORPHINE SULFATE (PF) 2 MG/ML IV SOLN
1.0000 mg | Freq: Once | INTRAVENOUS | Status: AC
  Administered 2016-04-24: 1 mg via INTRAVENOUS
  Filled 2016-04-24: qty 1

## 2016-04-24 MED ORDER — SORBITOL 70 % SOLN
30.0000 mL | Freq: Two times a day (BID) | Status: DC
  Administered 2016-04-24 – 2016-04-28 (×6): 30 mL
  Filled 2016-04-24 (×10): qty 30

## 2016-04-24 MED ORDER — HYDROCORTISONE 2.5 % RE CREA
TOPICAL_CREAM | Freq: Two times a day (BID) | RECTAL | Status: DC | PRN
  Filled 2016-04-24: qty 28.35

## 2016-04-24 MED ORDER — FLEET ENEMA 7-19 GM/118ML RE ENEM
1.0000 | ENEMA | Freq: Every day | RECTAL | Status: DC
  Administered 2016-04-24 – 2016-04-25 (×2): 1 via RECTAL
  Filled 2016-04-24 (×4): qty 1

## 2016-04-24 MED ORDER — MORPHINE SULFATE (PF) 2 MG/ML IV SOLN
1.0000 mg | INTRAVENOUS | Status: DC | PRN
  Administered 2016-04-24 (×4): 2 mg via INTRAVENOUS
  Filled 2016-04-24 (×5): qty 1

## 2016-04-24 MED ORDER — METOCLOPRAMIDE HCL 5 MG/ML IJ SOLN
10.0000 mg | Freq: Four times a day (QID) | INTRAMUSCULAR | Status: AC
  Administered 2016-04-24 – 2016-04-25 (×6): 10 mg via INTRAVENOUS
  Filled 2016-04-24 (×6): qty 2

## 2016-04-24 NOTE — Progress Notes (Signed)
[place] TEAM 1 - Stepdown/ICU TEAM  Cora CollumRobert C Pais  ZOX:[id number] DOB: July 21, 1922 DOA: 04/20/2016 PCP: Dwana MelenaZack Hall, MD   Brief Narrative:  82 y.o. M Hx DM2, HTN, Pulmonary Hypertension, SSS S/P Permanent Pacemaker 09/2007, OSA, Nephrolithiasis with Hydronephrosis and L Ureteral edema discharged from the hospital 7/3 on antibiotics for possible UTI, urine culture grew multiple species. It was suggested that the pt be d/c to SNF, but due to reported financial issues the pt opted for d/c home instead.  Patient got up to walk to the bathroom with his walker and fell to the ground because his legs gave out.  His neighbors came and found him after several hours of laying on the ground w/ a laceration of the right foot.  In the ED patient was found to be hypotensive and prehospital blood glucose was 88.  Subjective: The patient is complaining of severe uncontrolled pain in his low back and in his abdomen diffusely.  He reports no nausea since his NG tube was placed.  He feels his abdomen is significantly less distended.  He was given an enema yesterday without significant improvement.  He has not yet had a significant bowel movement.  Assessment & Plan:   Foot laceration/dislocation w/ fractured Rt fifth proximal interphalangeal joint -[facility] formal [facility]&D, complex wound repair, and fixation of R 5th toe - care per Ortho   Constipation - Ileus A chronic issue that became acutely worse - KUB noted severe gaseous gastric distention which was relieved after placement of an NG tube - follow-up imaging suggested ileus which has improved somewhat on KUB this morning - continue to stimulate bowels - up to chair and ambulate as much as possible  Severe acute renal failure  crt climbed after re-admit - urinary retention was the etiology w/ >1.5L removed after foley cath placed - renal fxn has now normalized   Recent Labs Lab 04/20/16 0130 04/20/16 0649 04/21/16 0600  04/21/16 1456 04/22/16 0400 04/23/16 0816  CREATININE 3.93* 3.25* 5.21* 5.25* 2.51* 1.06   Hyperkalemia Due to renal failure - corrected   SIRS - not likely Sepsis - no evidence of UTI  -no indication for ongoing abx for this diagnosis - SIRS physiology has resolved   LVH -noted on TTE this admit w/ preserved EF (50-55%) Filed Weights   04/22/16 0500 04/23/16 0400 04/24/16 0500  Weight: 145.151 kg (320 lb) 141.885 kg (312 lb 12.8 oz) 138.3 kg (304 lb 14.3 oz)    Sick Sinus Syndrome S/P pacemaker  COPD -Patient is supposed to use home O2 but is noncompliant  -Titrate O2 to maintain SPO2 89-93%  OSA -pt has been noncompliant with/refusing CPAP  DM2 - Hypoglycemia  -7/1 A1c 5.5 - hypoglycemia resolved - follow w/o change   B12 deficiency - Macrocytosis -replace w/ 7 day SQ load of B12 - will need to be followed up as outpt after trial or oral replacement   3.8 cm round lesion in the pancreatic tail -noted incidentally on CT renal study 04/17/16 - pancreatic protocol CT abdom suggested when able - imaging requires contrast and is therefore not appropriate presently due to renal failure - if renal fxn remains stable will schedule CT of abdom as soon as ileus resolved (pt will need to tolerate oral contrast)  DVT prophylaxis: SQ heparin  Code Status: DO NOT RESUSCITATE Family Communication: spoke w/ son in hallway at length   Disposition Plan: SDU   Consultants:  [facility]    Procedures/Significant Events:  7/1 CT renal stone survey - 3.8 cm round lesion in the pancreatic tail. -May saccular aneurysm of the splenic artery or a primary pancreatic cyst or neoplasm.-. Probable cyst interpolar right kidney  Antimicrobials: Aztreonam 7/4 > 7/5 Clindamycin 7/4 >  Objective: Filed Vitals:   04/24/16 0008 04/24/16 0435 04/24/16 0500 04/24/16 0800  BP:  152/66  153/69  Pulse:  66  72  Temp: 98.5 F (36.9 C) 97 F (36.1 C)  97.7 F (36.5 C)   TempSrc: Oral Oral  Oral  Resp:  16  24  Height:      Weight:   138.3 kg (304 lb 14.3 oz)   SpO2:  96%  96%    Intake/Output Summary (Last 24 hours) at 04/24/16 0915 Last data filed at 04/24/16 0518  Gross per 24 hour  Intake 1543.75 ml  Output   5225 ml  Net -3681.25 ml   Filed Weights   04/22/16 0500 04/23/16 0400 04/24/16 0500  Weight: 145.151 kg (320 lb) 141.885 kg (312 lb 12.8 oz) 138.3 kg (304 lb 14.3 oz)    Examination: General: No acute respiratory distress - alert and grumpy  Lungs: Clear to auscultation bilaterally - no wheezes or crackles Cardiovascular: Regular rate and rhythm without murmur Abdomen: Nontender, protuberant/overweight, soft, bowel sounds positive, no rebound Extremities: No significant cyanosis or clubbing; trace edema bilateral lower extremities is stable    CBC:  Recent Labs Lab 04/17/16 1651  04/18/16 0602 04/20/16 0130 04/20/16 0649 04/21/16 0600 04/22/16 0400  WBC 10.3  --  9.1 16.9* 13.6* 9.8 9.4  NEUTROABS 6.8  --   --  13.7*  --  7.0  --   HGB 14.5  < > 13.9 13.2 12.5* 11.9* 12.2*  HCT 43.4  < > 43.1 39.6 38.9* 37.6* 37.5*  MCV 99.8  --  102.1* 100.8* 101.6* 102.5* 101.1*  PLT 129*  --  117* 142* 124* 125* 129*  < > = values in this interval not displayed. Basic Metabolic Panel:  Recent Labs Lab 04/20/16 0649 04/21/16 0600 04/21/16 1456 04/22/16 0400 04/23/16 0816  NA 133* 133* 133* 137 139  K 4.6 5.5* 5.4* 4.7 5.1  CL 98* 98* 100* 104 107  CO2 26 25 23 26 28   GLUCOSE 83 132* 173* 156* 155*  BUN 57* 72* 75* 54* 37*  CREATININE 3.25* 5.21* 5.25* 2.51* 1.06  CALCIUM 7.7* 7.8* 7.8* 8.0* 7.7*  MG  --  2.3  --   --   --   PHOS  --   --   --  4.5 2.7   GFR: Estimated Creatinine Clearance: 64.1 mL/min (by C-G formula based on Cr of 1.06).   Liver Function Tests:  Recent Labs Lab 04/18/16 0602 04/20/16 0130 04/20/16 0649 04/21/16 0600 04/22/16 0400 04/23/16 0816  AST 29 29 34 28  --   --   ALT 24 23 21 21   --    --   ALKPHOS 50 45 40 40  --   --   BILITOT 0.9 1.0 0.7 0.5  --   --   PROT 6.2* 6.4* 5.5* 5.0*  --   --   ALBUMIN 3.3* 3.0* 2.6* 2.3* 2.2* 2.0*   Cardiac Enzymes:  Recent Labs Lab 04/20/16 0130  CKTOTAL 368  TROPONINI <0.03   CBG:  Recent Labs Lab 04/23/16 0822 04/23/16 1308 04/23/16 1627 04/23/16 2253 04/24/16 0830  GLUCAP 160* 156* 175* 144* 161*    Recent Results (  from the past 240 hour(s))  Urine culture     Status: Abnormal   Collection Time: 04/17/16  5:16 PM  Result Value Ref Range Status   Specimen Description URINE, RANDOM  Final   Special Requests Normal  Final   Culture MULTIPLE SPECIES PRESENT, SUGGEST RECOLLECTION (A)  Final   Report Status 04/19/2016 FINAL  Final  Urine culture     Status: None   Collection Time: 04/20/16  1:30 AM  Result Value Ref Range Status   Specimen Description URINE, CLEAN CATCH  Final   Special Requests NONE  Final   Culture NO GROWTH Performed at [facility]   Final   Report Status 04/21/2016 FINAL  Final  Blood Culture (routine x 2)     Status: None (Preliminary result)   Collection Time: 04/20/16  2:00 AM  Result Value Ref Range Status   Specimen Description BLOOD RIGHT ANTECUBITAL  Final   Special Requests   Final    BOTTLES DRAWN AEROBIC AND ANAEROBIC AEB 12CC ANA 10CC   Culture NO GROWTH 4 DAYS  Final   Report Status PENDING  Incomplete  Blood Culture (routine x 2)     Status: None (Preliminary result)   Collection Time: 04/20/16  2:15 AM  Result Value Ref Range Status   Specimen Description BLOOD RIGHT FOREARM  Final   Special Requests BOTTLES DRAWN AEROBIC AND ANAEROBIC 10CC EACH  Final   Culture NO GROWTH 4 DAYS  Final   Report Status PENDING  Incomplete  MRSA PCR Screening     Status: None   Collection Time: 04/20/16  6:12 AM  Result Value Ref Range Status   MRSA by PCR NEGATIVE NEGATIVE Final    Comment:        The GeneXpert MRSA Assay (FDA approved for NASAL specimens only), is one component  of a comprehensive MRSA colonization surveillance program. It is not intended to diagnose MRSA infection nor to guide or monitor treatment for MRSA infections.     Scheduled Meds: . aspirin EC  81 mg Oral Daily  . clindamycin (CLEOCIN) IV  600 mg Intravenous Q8H  . cyanocobalamin  1,000 mcg Subcutaneous Daily  . heparin subcutaneous  5,000 Units Subcutaneous Q8H  . insulin aspart  0-9 Units Subcutaneous TID WC  . metoCLOPramide (REGLAN) injection  5 mg Intravenous Q6H  . sorbitol  30 mL Oral Daily     LOS: 4 days    Lonia Blood, MD [facility] Office  [phone] Pager - Text Page per Loretha Stapler as per below:  On-Call/Text Page:      [email or web address]      password [email or web address]  If 7PM-7AM, please contact night-coverage [email or web address] Password [email or web address] 04/24/2016, 9:15 AM

## 2016-04-25 ENCOUNTER — Inpatient Hospital Stay (HOSPITAL_COMMUNITY): Payer: Medicare Other

## 2016-04-25 LAB — CULTURE, BLOOD (ROUTINE X 2)
CULTURE: NO GROWTH
Culture: NO GROWTH

## 2016-04-25 LAB — CBC
HCT: 43.8 % (ref 39.0–52.0)
Hemoglobin: 13.7 g/dL (ref 13.0–17.0)
MCH: 32.6 pg (ref 26.0–34.0)
MCHC: 31.3 g/dL (ref 30.0–36.0)
MCV: 104.3 fL — ABNORMAL HIGH (ref 78.0–100.0)
PLATELETS: 149 10*3/uL — AB (ref 150–400)
RBC: 4.2 MIL/uL — ABNORMAL LOW (ref 4.22–5.81)
RDW: 15.1 % (ref 11.5–15.5)
WBC: 8.7 10*3/uL (ref 4.0–10.5)

## 2016-04-25 LAB — COMPREHENSIVE METABOLIC PANEL
ALK PHOS: 49 U/L (ref 38–126)
ALT: 29 U/L (ref 17–63)
ANION GAP: 7 (ref 5–15)
AST: 29 U/L (ref 15–41)
Albumin: 2.3 g/dL — ABNORMAL LOW (ref 3.5–5.0)
BILIRUBIN TOTAL: 0.9 mg/dL (ref 0.3–1.2)
BUN: 23 mg/dL — ABNORMAL HIGH (ref 6–20)
CALCIUM: 8.9 mg/dL (ref 8.9–10.3)
CO2: 35 mmol/L — ABNORMAL HIGH (ref 22–32)
CREATININE: 0.99 mg/dL (ref 0.61–1.24)
Chloride: 105 mmol/L (ref 101–111)
Glucose, Bld: 177 mg/dL — ABNORMAL HIGH (ref 65–99)
Potassium: 4 mmol/L (ref 3.5–5.1)
Sodium: 147 mmol/L — ABNORMAL HIGH (ref 135–145)
TOTAL PROTEIN: 5.3 g/dL — AB (ref 6.5–8.1)

## 2016-04-25 LAB — MAGNESIUM: Magnesium: 2.1 mg/dL (ref 1.7–2.4)

## 2016-04-25 LAB — GLUCOSE, CAPILLARY
GLUCOSE-CAPILLARY: 147 mg/dL — AB (ref 65–99)
GLUCOSE-CAPILLARY: 192 mg/dL — AB (ref 65–99)
Glucose-Capillary: 151 mg/dL — ABNORMAL HIGH (ref 65–99)
Glucose-Capillary: 220 mg/dL — ABNORMAL HIGH (ref 65–99)

## 2016-04-25 LAB — PHOSPHORUS: PHOSPHORUS: 2.9 mg/dL (ref 2.5–4.6)

## 2016-04-25 MED ORDER — SENNOSIDES-DOCUSATE SODIUM 8.6-50 MG PO TABS
1.0000 | ORAL_TABLET | Freq: Two times a day (BID) | ORAL | Status: DC
  Administered 2016-04-25 – 2016-04-29 (×9): 1 via ORAL
  Filled 2016-04-25 (×11): qty 1

## 2016-04-25 MED ORDER — SODIUM CHLORIDE 0.45 % IV SOLN
INTRAVENOUS | Status: DC
  Administered 2016-04-25 – 2016-04-26 (×2): via INTRAVENOUS

## 2016-04-25 NOTE — Progress Notes (Signed)
[facility] TEAM 1 - Stepdown/ICU TEAM  Alan Briggs  ZOX:[id number] DOB: January 01, 1922 DOA: 04/20/2016 PCP: [name], MD   Brief Narrative:  82 y.o. M Hx DM2, HTN, Pulmonary Hypertension, SSS S/P Permanent Pacemaker 09/2007, OSA, Nephrolithiasis with Hydronephrosis and L Ureteral edema discharged from the hospital 7/3 on antibiotics for possible UTI, urine culture grew multiple species. It was suggested that the pt be d/c to SNF, but due to reported financial issues the pt opted for d/c home instead.  Patient got up to walk to the bathroom with his walker and fell to the ground because his legs gave out.  His neighbors came and found him after several hours of laying on the ground w/ a laceration of the right foot.  In the ED patient was found to be hypotensive.  Subjective: The patient had a large bowel movement yesterday afternoon following an enema with manual disimpaction.  He states he feels much better today.  His abdomen is appreciably less distended.  He is asking for something to eat.  He is also worried about swelling in his hands, more specifically his left hand where his ring finger is.  He denies pain in the finger and there is no discoloration the finger at this time.  Assessment & Plan:   Foot laceration/dislocation w/ fractured Rt fifth proximal interphalangeal joint -[facility] took pt for formal I&D, complex wound repair, and fixation of R 5th toe - care per Ortho - ?length of abx course  Constipation - Ileus A chronic issue that became acutely worse - KUB noted severe gaseous gastric distention which was relieved after placement of an NG tube - follow-up imaging suggested ileus, which is no longer evident on KUB today - continue to stimulate bowels - up to chair and ambulate as much as possible - clamp NG and began clear liquids only today - will be very slow in advancing diet  Severe acute renal failure - resolved crt climbed after re-admit - urinary retention  was the etiology w/ >1.5L removed after foley cath placed - renal fxn has now normalized   Recent Labs Lab 04/21/16 0600 04/21/16 1456 04/22/16 0400 04/23/16 0816 04/24/16 0813 04/25/16 0234  CREATININE 5.21* 5.25* 2.51* 1.06 0.97 0.99   Hyperkalemia Due to renal failure - corrected   SIRS - not likely Sepsis - no evidence of UTI  -no indication for ongoing abx for this diagnosis - SIRS physiology has resolved   LVH -noted on TTE this admit w/ preserved EF (50-55%) Filed Weights   04/23/16 0400 04/24/16 0500 04/25/16 0500  Weight: 141.885 kg (312 lb 12.8 oz) 138.3 kg (304 lb 14.3 oz) 134.1 kg (295 lb 10.2 oz)    Sick Sinus Syndrome S/P pacemaker  COPD -Patient is supposed to use home O2 but is noncompliant  -Titrate O2 to maintain SPO2 89-93%  OSA -pt has been noncompliant with/refusing CPAP  DM2 - Hypoglycemia  -7/1 A1c 5.5 - hypoglycemia resolved - CBG currently reasonably controlled  B12 deficiency - Macrocytosis -replace w/ 7 day SQ load of B12 - will need to be followed up as outpt after trial of oral replacement   3.8 cm round lesion in the pancreatic tail -noted incidentally on CT renal study 04/17/16 - pancreatic protocol CT abdom suggested when able - imaging requires contrast and is therefore not appropriate presently due to renal failure - if renal fxn remains stable will schedule CT of abdom as soon as ileus resolved (pt will need to  tolerate oral contrast)  DVT prophylaxis: SQ heparin  Code Status: DO NOT RESUSCITATE Family Communication:  No family present at time of exam today Disposition Plan: SDU - if tolerating clear liquids and ileus remains controlled will likely be a candidate for transfer 7/10  Consultants:  [facility]   Procedures/Significant Events:  7/1 CT renal stone survey - 3.8 cm round lesion in the pancreatic tail. -May saccular aneurysm of the splenic artery or a primary pancreatic cyst or neoplasm.-. Probable  cyst interpolar right kidney  Antimicrobials: Aztreonam 7/4 > 7/5 Clindamycin 7/4 >  Objective: Filed Vitals:   04/25/16 0043 04/25/16 0402 04/25/16 0500 04/25/16 0759  BP:  165/64  162/82  Pulse:  63  64  Temp: 98.4 F (36.9 C) 98.5 F (36.9 C)  98.3 F (36.8 C)  TempSrc: Oral Oral  Oral  Resp:  16  22  Height:      Weight:   134.1 kg (295 lb 10.2 oz)   SpO2:  97%  98%    Intake/Output Summary (Last 24 hours) at 04/25/16 1005 Last data filed at 04/25/16 0557  Gross per 24 hour  Intake   1210 ml  Output   4750 ml  Net  -3540 ml   Filed Weights   04/23/16 0400 04/24/16 0500 04/25/16 0500  Weight: 141.885 kg (312 lb 12.8 oz) 138.3 kg (304 lb 14.3 oz) 134.1 kg (295 lb 10.2 oz)    Examination: General: No acute respiratory distress  Lungs: Clear to auscultation bilaterally with distant breath sounds throughout Cardiovascular: Regular rate and rhythm without murmur Abdomen: Nontender, protuberant/overweight, soft, bowel sounds positive, no rebound Extremities: No significant cyanosis or clubbing; 1+ edema bilateral lower and upper extremities is stable    CBC:  Recent Labs Lab 04/20/16 0130 04/20/16 0649 04/21/16 0600 04/22/16 0400 04/25/16 0234  WBC 16.9* 13.6* 9.8 9.4 8.7  NEUTROABS 13.7*  --  7.0  --   --   HGB 13.2 12.5* 11.9* 12.2* 13.7  HCT 39.6 38.9* 37.6* 37.5* 43.8  MCV 100.8* 101.6* 102.5* 101.1* 104.3*  PLT 142* 124* 125* 129* 149*   Basic Metabolic Panel:  Recent Labs Lab 04/21/16 0600 04/21/16 1456 04/22/16 0400 04/23/16 0816 04/24/16 0813 04/25/16 0234  NA 133* 133* 137 139 144 147*  K 5.5* 5.4* 4.7 5.1 6.1* 4.0  CL 98* 100* 104 107 104 105  CO2 25 23 26 28 31  35*  GLUCOSE 132* 173* 156* 155* 160* 177*  BUN 72* 75* 54* 37* 29* 23*  CREATININE 5.21* 5.25* 2.51* 1.06 0.97 0.99  CALCIUM 7.8* 7.8* 8.0* 7.7* 8.2* 8.9  MG 2.3  --   --   --   --  2.1  PHOS  --   --  4.5 2.7 2.5 2.9   GFR: Estimated Creatinine Clearance: 67.5 mL/min (by  C-G formula based on Cr of 0.99).   Liver Function Tests:  Recent Labs Lab 04/20/16 0130 04/20/16 0649 04/21/16 0600 04/22/16 0400 04/23/16 0816 04/24/16 0813 04/25/16 0234  AST 29 34 28  --   --   --  29  ALT 23 21 21   --   --   --  29  ALKPHOS 45 40 40  --   --   --  49  BILITOT 1.0 0.7 0.5  --   --   --  0.9  PROT 6.4* 5.5* 5.0*  --   --   --  5.3*  ALBUMIN 3.0* 2.6* 2.3* 2.2* 2.0*  2.2* 2.3*   Cardiac Enzymes:  Recent Labs Lab 04/20/16 0130  CKTOTAL 368  TROPONINI <0.03   CBG:  Recent Labs Lab 04/24/16 0830 04/24/16 1239 04/24/16 1657 04/24/16 2209 04/25/16 0757  GLUCAP 161* 158* 152* 161* 151*    Recent Results (from the past 240 hour(s))  Urine culture     Status: Abnormal   Collection Time: 04/17/16  5:16 PM  Result Value Ref Range Status   Specimen Description URINE, RANDOM  Final   Special Requests Normal  Final   Culture MULTIPLE SPECIES PRESENT, SUGGEST RECOLLECTION (A)  Final   Report Status 04/19/2016 FINAL  Final  Urine culture     Status: None   Collection Time: 04/20/16  1:30 AM  Result Value Ref Range Status   Specimen Description URINE, CLEAN CATCH  Final   Special Requests NONE  Final   Culture NO GROWTH Performed at [facility]   Final   Report Status 04/21/2016 FINAL  Final  Blood Culture (routine x 2)     Status: None   Collection Time: 04/20/16  2:00 AM  Result Value Ref Range Status   Specimen Description BLOOD RIGHT ANTECUBITAL  Final   Special Requests   Final    BOTTLES DRAWN AEROBIC AND ANAEROBIC AEB 12CC ANA 10CC   Culture NO GROWTH 5 DAYS  Final   Report Status 04/25/2016 FINAL  Final  Blood Culture (routine x 2)     Status: None   Collection Time: 04/20/16  2:15 AM  Result Value Ref Range Status   Specimen Description BLOOD RIGHT FOREARM  Final   Special Requests BOTTLES DRAWN AEROBIC AND ANAEROBIC 10CC EACH  Final   Culture NO GROWTH 5 DAYS  Final   Report Status 04/25/2016 FINAL  Final  MRSA PCR Screening      Status: None   Collection Time: 04/20/16  6:12 AM  Result Value Ref Range Status   MRSA by PCR NEGATIVE NEGATIVE Final    Comment:        The GeneXpert MRSA Assay (FDA approved for NASAL specimens only), is one component of a comprehensive MRSA colonization surveillance program. It is not intended to diagnose MRSA infection nor to guide or monitor treatment for MRSA infections.     Scheduled Meds: . aspirin EC  81 mg Oral Daily  . clindamycin (CLEOCIN) IV  600 mg Intravenous Q8H  . cyanocobalamin  1,000 mcg Subcutaneous Daily  . heparin subcutaneous  5,000 Units Subcutaneous Q8H  . insulin aspart  0-9 Units Subcutaneous TID WC  . metoCLOPramide (REGLAN) injection  10 mg Intravenous Q6H  . sodium phosphate  1 enema Rectal Q1500  . sorbitol  30 mL Per Tube BID     LOS: 5 days    Lonia Blood, MD [facility] Office  [phone] Pager - Text Page per Amion as per below:  On-Call/Text Page:      [email or web address]      password [email or web address]  If 7PM-7AM, please contact night-coverage [email or web address] Password [email or web address] 04/25/2016, 10:05 AM

## 2016-04-26 LAB — BASIC METABOLIC PANEL
Anion gap: 8 (ref 5–15)
BUN: 22 mg/dL — AB (ref 6–20)
CHLORIDE: 105 mmol/L (ref 101–111)
CO2: 31 mmol/L (ref 22–32)
Calcium: 8.6 mg/dL — ABNORMAL LOW (ref 8.9–10.3)
Creatinine, Ser: 0.89 mg/dL (ref 0.61–1.24)
GFR calc Af Amer: >60 mL/min (ref >60)
GFR calc non Af Amer: >60 mL/min (ref >60)
GLUCOSE: 186 mg/dL — AB (ref 65–99)
POTASSIUM: 3.5 mmol/L (ref 3.5–5.1)
Sodium: 144 mmol/L (ref 135–145)

## 2016-04-26 LAB — GLUCOSE, CAPILLARY
GLUCOSE-CAPILLARY: 211 mg/dL — AB (ref 65–99)
Glucose-Capillary: 185 mg/dL — ABNORMAL HIGH (ref 65–99)
Glucose-Capillary: 220 mg/dL — ABNORMAL HIGH (ref 65–99)
Glucose-Capillary: 196 mg/dL — ABNORMAL HIGH (ref 65–99)

## 2016-04-26 MED ORDER — POTASSIUM CHLORIDE 10 MEQ/100ML IV SOLN
10.0000 meq | INTRAVENOUS | Status: AC
  Administered 2016-04-26 (×3): 10 meq via INTRAVENOUS
  Filled 2016-04-26 (×3): qty 100

## 2016-04-26 MED ORDER — POLYETHYLENE GLYCOL 3350 17 G PO PACK
17.0000 g | PACK | Freq: Two times a day (BID) | ORAL | Status: DC
  Administered 2016-04-28 – 2016-04-29 (×3): 17 g via ORAL
  Filled 2016-04-26 (×6): qty 1

## 2016-04-26 MED ORDER — INSULIN GLARGINE 100 UNIT/ML ~~LOC~~ SOLN
10.0000 [IU] | Freq: Every day | SUBCUTANEOUS | Status: DC
  Administered 2016-04-26 – 2016-04-29 (×4): 10 [IU] via SUBCUTANEOUS
  Filled 2016-04-26 (×6): qty 0.1

## 2016-04-26 MED ORDER — FAMOTIDINE IN NACL 20-0.9 MG/50ML-% IV SOLN
20.0000 mg | Freq: Two times a day (BID) | INTRAVENOUS | Status: DC
  Administered 2016-04-26: 20 mg via INTRAVENOUS
  Filled 2016-04-26 (×3): qty 50

## 2016-04-26 MED ORDER — POTASSIUM CHLORIDE 10 MEQ/100ML IV SOLN
10.0000 meq | Freq: Once | INTRAVENOUS | Status: AC
  Administered 2016-04-26: 10 meq via INTRAVENOUS
  Filled 2016-04-26: qty 100

## 2016-04-26 NOTE — Care Management Important Message (Signed)
Important Message  Patient Details  Name: Alan CollumRobert C Briggs MRN: [id number] Date of Birth: 1922-04-28   Medicare Important Message Given:  Yes    Bernadette HoitShoffner, [name] 04/26/2016, 8:07 AM

## 2016-04-26 NOTE — Care Management Note (Signed)
Case Management Note  Patient Details  Name: Alan CollumRobert C Briggs MRN: [id number] Date of Birth: October 15, 1922  Subjective/Objective:   Pt initially refused SNF for rehab but after talking with attending, is now agreeable, [facility] or [facility].  Also states he did not work with PT today but will work with her tomorrow.  Son is very pleased that he now agrees, states pt's weakness and refusal of rehab @ SNF after recent hospitalization @ [facility] is the reason pt fell and required a second hospitalization.                        Expected Discharge Plan:  Skilled Nursing Facility  In-House Referral:  Clinical Social Work  Discharge planning Services  CM Consult  Status of Service:  In process, will continue to follow  Magdalene RiverMayo, Aylen Rambert T, RN 04/26/2016, 3:17 PM

## 2016-04-26 NOTE — Progress Notes (Signed)
Pt refuseing CPAPn tonight. Will monotor

## 2016-04-26 NOTE — Progress Notes (Signed)
[facility] TEAM 1 - Stepdown/ICU TEAM  BODHI MORADI  ZOX:[id number] DOB: [birth year] DOA: 04/20/2016 PCP: [name], MD   Brief Narrative:  82 y.o. M Hx DM2, HTN, Pulmonary Hypertension, SSS S/P Permanent Pacemaker 09/2007, OSA, Nephrolithiasis with Hydronephrosis and L Ureteral edema discharged from the hospital 7/3 on antibiotics for possible UTI, urine culture grew multiple species. It was suggested that the pt be d/c to SNF, but due to reported financial issues the pt opted for d/c home instead.  Patient got up to walk to the bathroom with his walker and fell to the ground because his legs gave out.  His neighbors came and found him after several hours of laying on the ground w/ a laceration of the right foot.  In the ED patient was found to be hypotensive.  Subjective: The patient is feeling much better today.  He is asking his NG be removed.  He denies chest pain shortness breath or abdominal pain.  He is still somewhat reluctant to advance his diet.  He refused to get out of bed with physical therapy today but I have spoken to him at length about importance of mobilization and its length to his health and timely discharge.  Assessment & Plan:   Foot laceration/dislocation w/ fractured Rt fifth proximal interphalangeal joint -[facility] took pt for formal I&D, complex wound repair, and fixation of R 5th toe - care per Ortho - is completing 10 days of antibiotic therapy today - will discontinue and follow  Constipation - Ileus A chronic issue that became acutely worse - KUB noted severe gaseous gastric distention which was relieved after placement of an NG tube - follow-up imaging suggested ileus, which was no longer evident on KUB 7/9 and KUB today w/ normal bowel gas pattern - continue to stimulate bowels - up to chair and ambulate as much as possible - remove NG today - will be very slow in advancing diet, with change to full liquids today  Severe acute renal failure -  resolved crt climbed after re-admit - urinary retention was the etiology w/ >1.5L removed after foley cath placed - renal fxn has now normalized - with patient ambulating better will need to begin voiding trials  Recent Labs Lab 04/21/16 1456 04/22/16 0400 04/23/16 0816 04/24/16 0813 04/25/16 0234 04/26/16 0458  CREATININE 5.25* 2.51* 1.06 0.97 0.99 0.89   Hyperkalemia Due to renal failure - corrected   SIRS - not likely Sepsis - no evidence of UTI  -no indication for ongoing abx   LVH -noted on TTE this admit w/ preserved EF (50-55%) Filed Weights   04/24/16 0500 04/25/16 0500 04/26/16 0500  Weight: 138.3 kg (304 lb 14.3 oz) 134.1 kg (295 lb 10.2 oz) 133.7 kg (294 lb 12.1 oz)    Sick Sinus Syndrome S/P pacemaker  COPD -Patient is supposed to use home O2 but is noncompliant  -Titrate O2 to maintain SPO2 89-93%  OSA -pt has been noncompliant with/refusing CPAP  DM2 - Hypoglycemia  -7/1 A1c 5.5 - hypoglycemia resolved - CBG trending upward - adjust tx and follow   B12 deficiency - Macrocytosis -replacing w/ 7 day SQ load of B12 - will need to be followed up as outpt after trial of oral replacement   3.8 cm round lesion in the pancreatic tail -noted incidentally on CT renal study 04/17/16 - pancreatic protocol CT abdom to be accomplished prior to d/c if continues to tolerate liquid diet  DVT prophylaxis: SQ heparin  Code  Status: DO NOT RESUSCITATE Family Communication:  Spoke with son at bedside Disposition Plan: DC NG tube - encourage activity - advance to full liquid diet - obtain CT abdomen pancreatic protocol 7/11 if tolerates full liquid diet - transfer to telemetry bed - ultimate disposition will be discharged to SNF for rehabilitation stay  Consultants:  [facility]   Procedures/Significant Events:  7/1 CT renal stone survey - 3.8 cm round lesion in the pancreatic tail. -May saccular aneurysm of the splenic artery or a primary pancreatic  cyst or neoplasm.-. Probable cyst interpolar right kidney  Antimicrobials: Aztreonam 7/4 > 7/5 Clindamycin 7/4 > 7/10  Objective: Filed Vitals:   04/26/16 0400 04/26/16 0500 04/26/16 0800 04/26/16 1200  BP: 154/64  165/75 151/71  Pulse: 58  64 69  Temp:   97.5 F (36.4 C) 98.3 F (36.8 C)  TempSrc:   Oral Oral  Resp:   22 20  Height:      Weight:  133.7 kg (294 lb 12.1 oz)    SpO2: 93%  97% 93%    Intake/Output Summary (Last 24 hours) at 04/26/16 1447 Last data filed at 04/26/16 0800  Gross per 24 hour  Intake    460 ml  Output   1100 ml  Net   -640 ml   Filed Weights   04/24/16 0500 04/25/16 0500 04/26/16 0500  Weight: 138.3 kg (304 lb 14.3 oz) 134.1 kg (295 lb 10.2 oz) 133.7 kg (294 lb 12.1 oz)    Examination: General: No acute respiratory distress - alert and oriented - less grumpy today  Lungs: Clear to auscultation bilaterally - distant breath sounds throughout Cardiovascular: Regular rate and rhythm without murmur Abdomen: Nontender, protuberant/overweight, soft, bowel sounds positive, no rebound Extremities: No significant cyanosis or clubbing; 1+ edema bilateral lower and upper extremities    CBC:  Recent Labs Lab 04/20/16 0130 04/20/16 0649 04/21/16 0600 04/22/16 0400 04/25/16 0234  WBC 16.9* 13.6* 9.8 9.4 8.7  NEUTROABS 13.7*  --  7.0  --   --   HGB 13.2 12.5* 11.9* 12.2* 13.7  HCT 39.6 38.9* 37.6* 37.5* 43.8  MCV 100.8* 101.6* 102.5* 101.1* 104.3*  PLT 142* 124* 125* 129* 149*   Basic Metabolic Panel:  Recent Labs Lab 04/21/16 0600  04/22/16 0400 04/23/16 0816 04/24/16 0813 04/25/16 0234 04/26/16 0458  NA 133*  < > 137 139 144 147* 144  K 5.5*  < > 4.7 5.1 6.1* 4.0 3.5  CL 98*  < > 104 107 104 105 105  CO2 25  < > 26 28 31  35* 31  GLUCOSE 132*  < > 156* 155* 160* 177* 186*  BUN 72*  < > 54* 37* 29* 23* 22*  CREATININE 5.21*  < > 2.51* 1.06 0.97 0.99 0.89  CALCIUM 7.8*  < > 8.0* 7.7* 8.2* 8.9 8.6*  MG 2.3  --   --   --   --  2.1  --    PHOS  --   --  4.5 2.7 2.5 2.9  --   < > = values in this interval not displayed. GFR: Estimated Creatinine Clearance: 74.9 mL/min (by C-G formula based on Cr of 0.89).   Liver Function Tests:  Recent Labs Lab 04/20/16 0130 04/20/16 0649 04/21/16 0600 04/22/16 0400 04/23/16 0816 04/24/16 0813 04/25/16 0234  AST 29 34 28  --   --   --  29  ALT 23 21 21   --   --   --  29  ALKPHOS 45 40 40  --   --   --  49  BILITOT 1.0 0.7 0.5  --   --   --  0.9  PROT 6.4* 5.5* 5.0*  --   --   --  5.3*  ALBUMIN 3.0* 2.6* 2.3* 2.2* 2.0* 2.2* 2.3*   Cardiac Enzymes:  Recent Labs Lab 04/20/16 0130  CKTOTAL 368  TROPONINI <0.03   CBG:  Recent Labs Lab 04/25/16 1241 04/25/16 1640 04/25/16 2134 04/26/16 0925 04/26/16 1247  GLUCAP 147* 192* 220* 185* 220*    Recent Results (from the past 240 hour(s))  Urine culture     Status: Abnormal   Collection Time: 04/17/16  5:16 PM  Result Value Ref Range Status   Specimen Description URINE, RANDOM  Final   Special Requests Normal  Final   Culture MULTIPLE SPECIES PRESENT, SUGGEST RECOLLECTION (A)  Final   Report Status 04/19/2016 FINAL  Final  Urine culture     Status: None   Collection Time: 04/20/16  1:30 AM  Result Value Ref Range Status   Specimen Description URINE, CLEAN CATCH  Final   Special Requests NONE  Final   Culture NO GROWTH Performed at [facility]   Final   Report Status 04/21/2016 FINAL  Final  Blood Culture (routine x 2)     Status: None   Collection Time: 04/20/16  2:00 AM  Result Value Ref Range Status   Specimen Description BLOOD RIGHT ANTECUBITAL  Final   Special Requests   Final    BOTTLES DRAWN AEROBIC AND ANAEROBIC AEB 12CC ANA 10CC   Culture NO GROWTH 5 DAYS  Final   Report Status 04/25/2016 FINAL  Final  Blood Culture (routine x 2)     Status: None   Collection Time: 04/20/16  2:15 AM  Result Value Ref Range Status   Specimen Description BLOOD RIGHT FOREARM  Final   Special Requests BOTTLES  DRAWN AEROBIC AND ANAEROBIC 10CC EACH  Final   Culture NO GROWTH 5 DAYS  Final   Report Status 04/25/2016 FINAL  Final  MRSA PCR Screening     Status: None   Collection Time: 04/20/16  6:12 AM  Result Value Ref Range Status   MRSA by PCR NEGATIVE NEGATIVE Final    Comment:        The GeneXpert MRSA Assay (FDA approved for NASAL specimens only), is one component of a comprehensive MRSA colonization surveillance program. It is not intended to diagnose MRSA infection nor to guide or monitor treatment for MRSA infections.     Scheduled Meds: . aspirin EC  81 mg Oral Daily  . clindamycin (CLEOCIN) IV  600 mg Intravenous Q8H  . cyanocobalamin  1,000 mcg Subcutaneous Daily  . heparin subcutaneous  5,000 Units Subcutaneous Q8H  . insulin aspart  0-9 Units Subcutaneous TID WC  . senna-docusate  1 tablet Oral BID  . sodium phosphate  1 enema Rectal Q1500  . sorbitol  30 mL Per Tube BID     LOS: 6 days    Lonia Blood, MD [facility] Office  [phone] Pager - Text Page per Amion as per below:  On-Call/Text Page:      [email or web address]      password [email or web address]  If 7PM-7AM, please contact night-coverage [email or web address] Password [facility] 04/26/2016, 2:47 PM

## 2016-04-26 NOTE — Progress Notes (Signed)
Physical Therapy Treatment Patient Details Name: Alan Briggs MRN: [id number] DOB: 1922-04-06 Today's Date: 04/26/2016    History of Present Illness 82 y.o. M Hx DM2, HTN, Pulmonary Hypertension, Sick Sinus Syndrome S/P Permanent Pacemaker 09/2007, OSA, Nephrolithiasis with Hydronephrosis and L Ureteral edema discharged from the hospital 7/3 on antibiotics for possible UTI, urine culture grew multiple species. It was suggested that the pt be d/c to SNF, but due to reported financial issues the pt opted for d/c home instead. Patient got up to walk to the bathroom with his walker and fell to the ground because his legs gave out. His neighbors came and found him after several hours of laying on the ground w/ a laceration of the right foot. Underwent fixation of the right 5th toe on 04/20/16    PT Comments    Patient declined EOB/OOB activity today reports not feeling well.  Encouraged mobility to expidite d/c home, but only agreeable to in bed exercises.  Patient easily fatigued and refused more than a few in the bed.  Will need SNF rehab at d/c; however, if he continues to decline SNF, will need lift, wheelchair, hospital bed and maximized [facility] services as well as ambulance transport home.   Follow Up Recommendations  SNF     Equipment Recommendations  None recommended by PT    Recommendations for Other Services       Precautions / Restrictions Precautions Precautions: Fall Precaution Comments: pt reports multiple falls at home with knees giving way but so far they have all been onto a safe surface, ie, bed, chair, etc with no serious injury Required Braces or Orthoses: Other Brace/Splint Other Brace/Splint: post op shoe in the room Restrictions RLE Weight Bearing: Weight bearing as tolerated RLE Partial Weight Bearing Percentage or Pounds: Weight bearig as tolrated on heal only    Mobility  Bed Mobility               General bed mobility comments: declined EOB/OOB activity    Transfers                    Ambulation/Gait                 Stairs            Wheelchair Mobility    Modified Rankin (Stroke Patients Only)       Balance                                    Cognition Arousal/Alertness: Awake/alert Behavior During Therapy: WFL for tasks assessed/performed Overall Cognitive Status: Within Functional Limits for tasks assessed                      Exercises General Exercises - Upper Extremity Shoulder Flexion: AROM;AAROM;Both;10 reps;Supine Elbow Flexion: AROM;Both;10 reps;Supine General Exercises - Lower Extremity Ankle Circles/Pumps: AROM;Both;10 reps;Supine Heel Slides: AROM;Both;10 reps;Supine    General Comments        Pertinent Vitals/Pain Faces Pain Scale: Hurts little more Pain Location: generalized Pain Descriptors / Indicators: Aching Pain Intervention(s): Monitored during session    Home Living                      Prior Function            PT Goals (current goals can now be found in the care plan section) Progress towards PT  goals: Not progressing toward goals - comment (due to declined mobility today)    Frequency  Min 3X/week    PT Plan Current plan remains appropriate    Co-evaluation             End of Session Equipment Utilized During Treatment: Oxygen Activity Tolerance: Patient limited by fatigue Patient left: in bed;with call bell/phone within reach;with bed alarm set     Time: [id number] PT Time Calculation (min) (ACUTE ONLY): 18 min  Charges:  $Therapeutic Exercise: 8-22 mins                    G Codes:      Elray McgregorCynthia Moses Odoherty 04/26/2016, 12:19 PM  Sheran Lawlessyndi Anna Beaird, PT [phone] 04/26/2016

## 2016-04-27 ENCOUNTER — Encounter (HOSPITAL_COMMUNITY): Payer: Self-pay | Admitting: Radiology

## 2016-04-27 ENCOUNTER — Inpatient Hospital Stay (HOSPITAL_COMMUNITY): Payer: Medicare Other

## 2016-04-27 LAB — GLUCOSE, CAPILLARY
GLUCOSE-CAPILLARY: 203 mg/dL — AB (ref 65–99)
GLUCOSE-CAPILLARY: 207 mg/dL — AB (ref 65–99)
GLUCOSE-CAPILLARY: 204 mg/dL — AB (ref 65–99)
GLUCOSE-CAPILLARY: 193 mg/dL — AB (ref 65–99)

## 2016-04-27 LAB — COMPREHENSIVE METABOLIC PANEL
ALT: 27 U/L (ref 17–63)
AST: 23 U/L (ref 15–41)
Albumin: 2.3 g/dL — ABNORMAL LOW (ref 3.5–5.0)
Alkaline Phosphatase: 46 U/L (ref 38–126)
Anion gap: 7 (ref 5–15)
BILIRUBIN TOTAL: 0.9 mg/dL (ref 0.3–1.2)
BUN: 21 mg/dL — ABNORMAL HIGH (ref 6–20)
CALCIUM: 8.3 mg/dL — AB (ref 8.9–10.3)
CHLORIDE: 105 mmol/L (ref 101–111)
CO2: 29 mmol/L (ref 22–32)
CREATININE: 0.96 mg/dL (ref 0.61–1.24)
Glucose, Bld: 227 mg/dL — ABNORMAL HIGH (ref 65–99)
Potassium: 3.6 mmol/L (ref 3.5–5.1)
Sodium: 141 mmol/L (ref 135–145)
TOTAL PROTEIN: 5.4 g/dL — AB (ref 6.5–8.1)

## 2016-04-27 MED ORDER — DIATRIZOATE MEGLUMINE & SODIUM 66-10 % PO SOLN
ORAL | Status: AC
  Administered 2016-04-27: 11:00:00
  Filled 2016-04-27: qty 30

## 2016-04-27 MED ORDER — LEVOFLOXACIN IN D5W 750 MG/150ML IV SOLN
750.0000 mg | INTRAVENOUS | Status: DC
  Administered 2016-04-27: 750 mg via INTRAVENOUS
  Filled 2016-04-27: qty 150

## 2016-04-27 MED ORDER — FAMOTIDINE 20 MG PO TABS
20.0000 mg | ORAL_TABLET | Freq: Two times a day (BID) | ORAL | Status: DC
  Administered 2016-04-27 – 2016-04-29 (×6): 20 mg via ORAL
  Filled 2016-04-27 (×7): qty 1

## 2016-04-27 MED ORDER — IOPAMIDOL (ISOVUE-300) INJECTION 61%
INTRAVENOUS | Status: AC
  Administered 2016-04-27: 100 mL
  Filled 2016-04-27: qty 100

## 2016-04-27 NOTE — Progress Notes (Signed)
Orthopedic Tech Progress Note Patient Details:  Alan CollumRobert C Briggs Oct 18, 1922 [id number]  Ortho Devices Type of Ortho Device: Postop shoe/boot Ortho Device/Splint Location: Replacement post op shoe Ortho Device/Splint Interventions: Application   Alan FordyceJennifer C Ani Briggs 04/27/2016, 1:29 PM

## 2016-04-27 NOTE — Progress Notes (Signed)
Physical Therapy Treatment Patient Details Name: Alan Briggs MRN: [id number] DOB: July 14, 1922 Today's Date: 04/27/2016    History of Present Illness 82 y.o. M Hx DM2, HTN, Pulmonary Hypertension, Sick Sinus Syndrome S/P Permanent Pacemaker 09/2007, OSA, Nephrolithiasis with Hydronephrosis and L Ureteral edema discharged from the hospital 7/3 on antibiotics for possible UTI, urine culture grew multiple species. It was suggested that the pt be d/c to SNF, but due to reported financial issues the pt opted for d/c home instead. Patient got up to walk to the bathroom with his walker and fell to the ground because his legs gave out. His neighbors came and found him after several hours of laying on the ground w/ a laceration of the right foot. Underwent fixation of the right 5th toe on 04/20/16    PT Comments    Pt making slow progress. Post op shoe evidently didn't come with pt when he transferred units so called ortho techs to bring replacement.  Follow Up Recommendations  SNF     Equipment Recommendations  None recommended by PT    Recommendations for Other Services       Precautions / Restrictions Precautions Precautions: Fall Precaution Comments: pt reports multiple falls at home with knees giving way but so far they have all been onto a safe surface, ie, bed, chair, etc with no serious injury Required Braces or Orthoses: Other Brace/Splint Other Brace/Splint: post op shoe in the room Restrictions Weight Bearing Restrictions: Yes RLE Weight Bearing: Weight bearing as tolerated RLE Partial Weight Bearing Percentage or Pounds: Weight bearig as tolrated on heel only    Mobility  Bed Mobility Overal bed mobility: Needs Assistance Bed Mobility: Supine to Sit     Supine to sit: +2 for physical assistance;Mod assist     General bed mobility comments: Assist to bring legs off bed, elevate trunk into sitting and bring hips to EOB.  Transfers Overall transfer level: Needs  assistance Equipment used: Ambulation equipment used Transfers: Sit to/from [employer] Sit to Stand: +2 physical assistance;Mod assist Stand pivot transfers: Max assist;+2 physical assistance       General transfer comment: Assist to bring hips up and cues to stand fully erect. Turned to chair with Stedy.  Ambulation/Gait                 Stairs            Wheelchair Mobility    Modified Rankin (Stroke Patients Only)       Balance Overall balance assessment: Needs assistance Sitting-balance support: Feet supported;Single extremity supported Sitting balance-Leahy Scale: Poor Sitting balance - Comments: Pt requires UE support    Standing balance support: Bilateral upper extremity supported Standing balance-Leahy Scale: Poor Standing balance comment: +2 mod A to maintain static standing on the [employer] Arousal/Alertness: Awake/alert Behavior During Therapy: WFL for tasks assessed/performed Overall Cognitive Status: Within Functional Limits for tasks assessed                      Exercises      General Comments        Pertinent Vitals/Pain Pain Assessment: Faces Faces Pain Scale: Hurts little more Pain Location: generalized Pain Descriptors / Indicators: Grimacing Pain Intervention(s): Limited activity within patient's tolerance;Monitored during session    Home Living  Prior Function            PT Goals (current goals can now be found in the care plan section) Progress towards PT goals: Progressing toward goals (slowly)    Frequency  Min 3X/week    PT Plan Current plan remains appropriate    Co-evaluation             End of Session Equipment Utilized During Treatment: Oxygen;Gait belt Activity Tolerance: Patient limited by fatigue Patient left: with call bell/phone within reach;in chair;with chair alarm set     Time: [id number] PT Time  Calculation (min) (ACUTE ONLY): 19 min  Charges:  $Therapeutic Activity: 8-22 mins                    G Codes:      Summerlynn Glauser 04/27/2016, 1:07 PM [employer] CorporationCary Wavie Hashimi PT [phone]

## 2016-04-27 NOTE — Progress Notes (Addendum)
[facility] Progress Note  Patient: Alan Briggs ZOX:[id number]   PCP: [name], MD DOB: [birth year]   DOA: 04/20/2016   DOS: 04/27/2016   Date of Service: the patient was seen and examined on 04/27/2016  Subjective: The patient continues to complain about abdominal distention. No nausea no vomiting. Also having a bowel movement. Passing gas. No fever no chills. Nutrition: Tolerating clear liquids  Brief hospital course: Pt. with PMH of DM 2, HTN, pH TM, PPM, OSA, recent UTI with renal stone and hydronephrosis and discharged on antibiotic; admitted on 04/20/2016, with complaint of fall, was found to have laceration of the right foot. Patient is S/P irrigation and debridement of the right foot with complex wound repair and fixation of the right fifth toe dislocation. Patient was also found to be having significant ileus with gastric distention and required a G-tube which was removed 04/26/2016. Foley catheter placed on 04/21/2016 for acute retention of the urine. Currently further plan is continue further workup of the gastric distention.  Assessment and Plan: 1. Foot laceration Dr[facility] took pt for formal I&D, complex wound repair, and fixation of R 5th toe - care per Ortho - patient completed 10 days of antibiotic therapy on 04/26/2016.  2. Gastric distention with constipation, possible ileus. CT scan of the abdomen shows evidence of gastric distention without any obstruction. Possibly ileus. NG tube removed on 04/26/2016. Patient is denying any nausea and vomiting and is actually passing gas. A chronic issue that became acutely worse - up to chair and ambulate as much as possible - Continue full liquids today  3. Pancreatic mass. CT scan shows evidence of exophytic cystic structure of the inferior body of the pancreas 4 cm x 4 cm. GI may need to be consulted in the morning of 04/28/2016 for further workup, if the pt has persistent symptoms of obstruction. At present  pt is tolerating liquids and having BMs.  Patient may require EUS as an outpatient once medically stable.  4. AKI. Due to urinary retention Resolved. Improved with placement of catheter and gentle IV hydration. Patient will need urology follow-up as an outpatient for beginning of the voiding trial.  5. SIRS.  patient presented with multiple symptoms concerning for SIRS no evidence of active sepsis. Continue to closely monitor.  6. Possible bronchitis.  patient complains of cough and shortness of breath and greenish expectoration with mild hypoxia. I would start the patient on Levaquin for 5 days.  7.Sick Sinus Syndrome S/P pacemaker  8 COPD -Patient is supposed to use home O2 but is noncompliant  -Titrate O2 to maintain SPO2 89-93%  9 OSA -pt has been noncompliant with/refusing CPAP  10 DM2 - Hypoglycemia  -7/1 A1c 5.5 - hypoglycemia resolved - CBG trending upward - adjust tx and follow   11 B12 deficiency - Macrocytosis -replacing w/ 7 day SQ load of B12 - will need to be followed up as outpt after trial of oral replacement   Pain management: When necessary morphine and tramadol Activity: Consulted physical therapy, recommends SNF Bowel regimen: last BM 04/27/2016 Diet: Full liquid diet DVT Prophylaxis: subcutaneous Heparin  Advance goals of care discussion: DNR/DNI  Family Communication: no family was present at bedside, at the time of interview.   Disposition:  Discharge to SNF. Expected discharge date: 04/30/2016, GI workup  Consultants: None Procedures: Foley catheter placement  Antibiotics: Anti-infectives    Start     Dose/Rate Route Frequency Ordered Stop   04/27/16 1130  levofloxacin (LEVAQUIN)  IVPB 750 mg     750 mg 100 mL/hr over 90 Minutes Intravenous Every 24 hours 04/27/16 1035     04/20/16 1200  clindamycin (CLEOCIN) IVPB 600 mg     600 mg 100 mL/hr over 30 Minutes Intravenous Every 8 hours 04/20/16 1117 04/26/16 2333   04/20/16 0800  aztreonam  (AZACTAM) 1 g in dextrose 5 % 50 mL IVPB  Status:  Discontinued     1 g 100 mL/hr over 30 Minutes Intravenous Every 8 hours 04/20/16 0728 04/21/16 1025   04/20/16 0315  vancomycin (VANCOCIN) IVPB 1000 mg/200 mL premix     1,000 mg 200 mL/hr over 60 Minutes Intravenous  Once 04/20/16 0302 04/20/16 0508        Intake/Output Summary (Last 24 hours) at 04/27/16 2018 Last data filed at 04/27/16 1937  Gross per 24 hour  Intake    141 ml  Output   1426 ml  Net  -1285 ml   Filed Weights   04/25/16 0500 04/26/16 0500 04/27/16 0350  Weight: 134.1 kg (295 lb 10.2 oz) 133.7 kg (294 lb 12.1 oz) 132.6 kg (292 lb 5.3 oz)    Objective: Physical Exam: Filed Vitals:   04/26/16 2045 04/27/16 0350 04/27/16 0505 04/27/16 1534  BP: 175/98 152/67 100/53 165/71  Pulse: 88 62 71 80  Temp: 98.3 F (36.8 C) 98.2 F (36.8 C) 97.7 F (36.5 C) 98.2 F (36.8 C)  TempSrc: Oral Oral Oral Oral  Resp:  20 20 18   Height:      Weight:  132.6 kg (292 lb 5.3 oz)    SpO2: 96% 97% 98% 96%    General: Alert, Awake and Oriented to Time, Place and Person. Appear in mild distress Eyes: PERRL, Conjunctiva normal ENT: Oral Mucosa clear moist. Neck: difficult to assess JVD, no Abnormal Mass Or lumps Cardiovascular: S1 and S2 Present, aortic systolic Murmur, Respiratory: Bilateral Air entry equal and Decreased, basal Crackles, no wheezes Abdomen: Bowel Sound Absent, Soft and distended with mild tenderness Skin: no redness, no Rash  Extremities: no Pedal edema, no calf tenderness Neurologic: Grossly no focal neuro deficit. Bilaterally Equal motor strength  Data Reviewed: CBC:  Recent Labs Lab 04/21/16 0600 04/22/16 0400 04/25/16 0234  WBC 9.8 9.4 8.7  NEUTROABS 7.0  --   --   HGB 11.9* 12.2* 13.7  HCT 37.6* 37.5* 43.8  MCV 102.5* 101.1* 104.3*  PLT 125* 129* 149*   Basic Metabolic Panel:  Recent Labs Lab 04/21/16 0600  04/22/16 0400 04/23/16 0816 04/24/16 0813 04/25/16 0234 04/26/16 0458  04/27/16 0420  NA 133*  < > 137 139 144 147* 144 141  K 5.5*  < > 4.7 5.1 6.1* 4.0 3.5 3.6  CL 98*  < > 104 107 104 105 105 105  CO2 25  < > 26 28 31  35* 31 29  GLUCOSE 132*  < > 156* 155* 160* 177* 186* 227*  BUN 72*  < > 54* 37* 29* 23* 22* 21*  CREATININE 5.21*  < > 2.51* 1.06 0.97 0.99 0.89 0.96  CALCIUM 7.8*  < > 8.0* 7.7* 8.2* 8.9 8.6* 8.3*  MG 2.3  --   --   --   --  2.1  --   --   PHOS  --   --  4.5 2.7 2.5 2.9  --   --   < > = values in this interval not displayed.  Liver Function Tests:  Recent Labs Lab 04/21/16 0600 04/22/16 0400 04/23/16  [id number] 04/24/16 0813 04/25/16 0234 04/27/16 0420  AST 28  --   --   --  29 23  ALT 21  --   --   --  29 27  ALKPHOS 40  --   --   --  49 46  BILITOT 0.5  --   --   --  0.9 0.9  PROT 5.0*  --   --   --  5.3* 5.4*  ALBUMIN 2.3* 2.2* 2.0* 2.2* 2.3* 2.3*   No results for input(s): LIPASE, AMYLASE in the last 168 hours. No results for input(s): AMMONIA in the last 168 hours. Coagulation Profile: No results for input(s): INR, PROTIME in the last 168 hours. Cardiac Enzymes: No results for input(s): CKTOTAL, CKMB, CKMBINDEX, TROPONINI in the last 168 hours. BNP (last 3 results) No results for input(s): PROBNP in the last 8760 hours.  CBG:  Recent Labs Lab 04/26/16 1747 04/26/16 2114 04/27/16 0622 04/27/16 1109 04/27/16 1614  GLUCAP 196* 211* 203* 207* 204*    Studies: Ct Abdomen Pelvis W Contrast  04/27/2016  CLINICAL DATA:  Abdominal distention and pain. EXAM: CT ABDOMEN AND PELVIS WITH CONTRAST TECHNIQUE: Multidetector CT imaging of the abdomen and pelvis was performed using the standard protocol following bolus administration of intravenous contrast. CONTRAST:  100mL ISOVUE-300 IOPAMIDOL (ISOVUE-300) INJECTION 61% COMPARISON:  Abdominal radiograph from the same date. FINDINGS: Lower chest: There are bilateral small pleural effusions with subsegmental atelectasis. The heart is mildly enlarged. There is heavy atherosclerotic  disease of the coronary arteries and aorta. Hepatobiliary: There is a 2.1 cm subcapsular right hepatic cyst. No suspicious masses are seen. The gallbladder is normally distended. Pancreas: The pancreas is mostly fatty replaced. There is a partially exophytic circumscribed cystic structure off of the inferior body of the pancreas measuring 3.9 x 3.8 x 4.0 cm. Spleen: Calcified granuloma of the within the spleen are noted. Additionally there is an area of geographic hypoattenuation in the midportion of the spleen, which may represent a hypoperfusion abnormality. Adrenals/Urinary Tract: Bilateral adrenal hyperplasia. The kidneys demonstrate mild cortical thinning. There is a benign-appearing exophytic right renal cyst off of the inferior pole of the kidney. Stomach/Bowel: No evidence of obstruction, inflammatory process, or abnormal fluid collections. Gaseous distension of the stomach is noted. No structural obstruction of the stomach outlet is seen. Vascular/Lymphatic: No pathologically enlarged lymph nodes. No evidence of abdominal aortic aneurysm. Reproductive: No mass or other significant abnormality. Urinary bladder is decompressed around urinary Foley. Other: None. Musculoskeletal:  No suspicious bone lesions identified. IMPRESSION: Bilateral small pleural effusions with associated subsegmental atelectasis in the lung bases. Advanced atherosclerotic disease of the coronary arteries and aorta. Stable benign-appearing 2.1 cm subcapsular right hepatic cyst. Gaseous distension of the stomach without identifiable cause of gastric outlet obstruction. Stable exophytic cystic lesion off of the distal body of the pancreas, which may represent a side branch IPMN or other indolent primarily pancreatic malignancy. Electronically Signed   By: Ted Mcalpineobrinka  Dimitrova M.D.   On: 04/27/2016 16:21   Dg Abd Portable 1v  04/27/2016  CLINICAL DATA:  Abdominal distention and pain since early morning. EXAM: PORTABLE ABDOMEN - 1 VIEW  COMPARISON:  04/25/2016 FINDINGS: Gaseous distention of the stomach. No small or large bowel distention. Scattered gas and stool in the colon. Prominent vascular calcifications in the splenic artery. Degenerative changes in the spine. IMPRESSION: Gaseous distention of the stomach may indicate outlet obstruction or gastroparesis. Electronically Signed   By: Burman NievesWilliam  Stevens M.D.   On: 04/27/2016  06:47     Scheduled Meds: . aspirin EC  81 mg Oral Daily  . cyanocobalamin  1,000 mcg Subcutaneous Daily  . famotidine  20 mg Oral BID  . heparin subcutaneous  5,000 Units Subcutaneous Q8H  . insulin aspart  0-9 Units Subcutaneous TID WC  . insulin glargine  10 Units Subcutaneous QHS  . levofloxacin (LEVAQUIN) IV  750 mg Intravenous Q24H  . polyethylene glycol  17 g Oral BID  . senna-docusate  1 tablet Oral BID  . sodium phosphate  1 enema Rectal Q1500  . sorbitol  30 mL Per Tube BID   Continuous Infusions: . sodium chloride 10 mL/hr at 04/26/16 2303   PRN Meds: acetaminophen, acetaminophen, hydrocortisone, morphine injection, ondansetron **OR** ondansetron (ZOFRAN) IV, traMADol  Time spent: 30 minutes  Author: Lynden Oxford, MD [facility] Pager: [phone] 04/27/2016 8:18 PM  If 7PM-7AM, please contact night-coverage at [email or web address], password [facility]

## 2016-04-27 NOTE — Progress Notes (Signed)
Utilization review completed.  

## 2016-04-27 NOTE — Progress Notes (Signed)
Pharmacy Antibiotic Note  Alan Briggs is a 82 y.o. male admitted on 04/20/2016 with pneumonia.  Pharmacy has been consulted for levofloxacin dosing.  Plan: Levofloxacin 750mg  Q24H. Will follow up with patient progress and renal function.  Height: 6' (182.9 cm) Weight: 292 lb 5.3 oz (132.6 kg) IBW/kg (Calculated) : 77.6  Temp (24hrs), Avg:98.2 F (36.8 C), Min:97.7 F (36.5 C), Max:98.5 F (36.9 C)   Recent Labs Lab 04/20/16 1033 04/21/16 0600  04/22/16 0400 04/23/16 0816 04/24/16 0813 04/25/16 0234 04/26/16 0458 04/27/16 0420  WBC  --  9.8  --  9.4  --   --  8.7  --   --   CREATININE  --  5.21*  < > 2.51* 1.06 0.97 0.99 0.89 0.96  LATICACIDVEN 1.4  --   --   --   --   --   --   --   --   < > = values in this interval not displayed.  Estimated Creatinine Clearance: 69.2 mL/min (by C-G formula based on Cr of 0.96).    Allergies  Allergen Reactions  . Advair Diskus [Fluticasone-Salmeterol]     Told by MD not to use  . Combivent [Ipratropium-Albuterol]     Told by MD not to use  . Lotensin Hct [Benazepril-Hydrochlorothiazide]     Told by MD not to use  . Penicillins Swelling  . Simvastatin Other (See Comments)    myalgias    Antimicrobials this admission: Vanc 7/4 x 1  Azactam 7/4 >>7/5 Levofloxacin 7/11>>  Microbiology results: 7/4 UCx >> neg 7/4 BCx >> neg 7/4 MRSA PCR >> neg  Thank you for allowing pharmacy to be a part of this patient's care.  Alan KaufmanKai Taseen Marasigan Bernette Redbird(Kenny), PharmD  PGY1 Pharmacy Resident Pager: [phone] 04/27/2016 10:14 AM

## 2016-04-27 NOTE — Progress Notes (Signed)
Inpatient Diabetes Program Recommendations  AACE/ADA: New Consensus Statement on Inpatient Glycemic Control (2015)  Target Ranges:  Prepandial:   less than 140 mg/dL      Peak postprandial:   less than 180 mg/dL (1-2 hours)      Critically ill patients:  140 - 180 mg/dL   Lab Results  Component Value Date   GLUCAP 203* 04/27/2016   HGBA1C 5.5 04/20/2016    Review of Glycemic Control:  Results for Cora CollumGUNN, Geroge C (MRN [id number]) as of 04/27/2016 12:54  Ref. Range 04/26/2016 09:25 04/26/2016 12:47 04/26/2016 17:47 04/26/2016 21:14 04/27/2016 06:22  Glucose-Capillary Latest Ref Range: 65-99 mg/dL 409185 (H) 811220 (H) 914196 (H) 211 (H) 203 (H)   Diabetes history: Type 2 diabetes Outpatient Diabetes medications: Lantus 56 units bid, Humalog 12 units tid with meals Current orders for Inpatient glycemic control:  Lantus 10 units q HS, Novolog sensitive tid with meals  Inpatient Diabetes Program Recommendations:    Please consider increasing Lantus to 20 units q HS.  Also consider adding Novolog 3 units tid with meals.  Thanks, Beryl MeagerJenny Aquinnah Devin, RN, BC-ADM Inpatient Diabetes Coordinator Pager [phone] (8a-5p)

## 2016-04-28 ENCOUNTER — Inpatient Hospital Stay (HOSPITAL_COMMUNITY): Payer: Medicare Other

## 2016-04-28 DIAGNOSIS — G4733 Obstructive sleep apnea (adult) (pediatric): Secondary | ICD-10-CM

## 2016-04-28 DIAGNOSIS — E118 Type 2 diabetes mellitus with unspecified complications: Secondary | ICD-10-CM

## 2016-04-28 DIAGNOSIS — S91311S Laceration without foreign body, right foot, sequela: Secondary | ICD-10-CM

## 2016-04-28 LAB — CBC
HCT: 40 % (ref 39.0–52.0)
HEMOGLOBIN: 12.7 g/dL — AB (ref 13.0–17.0)
MCH: 32.6 pg (ref 26.0–34.0)
MCHC: 31.8 g/dL (ref 30.0–36.0)
MCV: 102.8 fL — ABNORMAL HIGH (ref 78.0–100.0)
Platelets: 137 10*3/uL — ABNORMAL LOW (ref 150–400)
RBC: 3.89 MIL/uL — AB (ref 4.22–5.81)
RDW: 15.5 % (ref 11.5–15.5)
WBC: 8.5 10*3/uL (ref 4.0–10.5)

## 2016-04-28 LAB — GLUCOSE, CAPILLARY
GLUCOSE-CAPILLARY: 195 mg/dL — AB (ref 65–99)
GLUCOSE-CAPILLARY: 165 mg/dL — AB (ref 65–99)
Glucose-Capillary: 167 mg/dL — ABNORMAL HIGH (ref 65–99)
Glucose-Capillary: 182 mg/dL — ABNORMAL HIGH (ref 65–99)

## 2016-04-28 LAB — BASIC METABOLIC PANEL
ANION GAP: 7 (ref 5–15)
BUN: 20 mg/dL (ref 6–20)
CHLORIDE: 105 mmol/L (ref 101–111)
CO2: 31 mmol/L (ref 22–32)
CREATININE: 0.88 mg/dL (ref 0.61–1.24)
Calcium: 8.6 mg/dL — ABNORMAL LOW (ref 8.9–10.3)
GFR calc non Af Amer: >60 mL/min (ref >60)
Glucose, Bld: 171 mg/dL — ABNORMAL HIGH (ref 65–99)
Potassium: 3.6 mmol/L (ref 3.5–5.1)
SODIUM: 143 mmol/L (ref 135–145)

## 2016-04-28 LAB — MAGNESIUM: Magnesium: 2 mg/dL (ref 1.7–2.4)

## 2016-04-28 MED ORDER — LEVOFLOXACIN 750 MG PO TABS
750.0000 mg | ORAL_TABLET | Freq: Every day | ORAL | Status: DC
  Administered 2016-04-28 – 2016-04-29 (×2): 750 mg via ORAL
  Filled 2016-04-28 (×3): qty 1

## 2016-04-28 NOTE — Consult Note (Signed)
   [facility]   04/28/2016  Cora CollumRobert C Catlin [birth year] [id number]  Patient screened for potential [facility] Care Management services. [facility] is [facility] under patient's Medicare  plan. Spoke with inpatient RNCM and patient's discharge plan is currently for a skilled nursing stay.  Chart review reveals patient has a HX of COPD, HF, and acute renal failure and discharge plan is requested [facility] per CSW notes..  Please place a [facility] Care [facility] consult if patient's plan changes or for questions contact:   Charlesetta ShanksVictoria Rosalena Mccorry, RN BSN CCM [facility] Liaison  [phone] business mobile phone Toll free office [phone]

## 2016-04-28 NOTE — Clinical SW OB High Risk (Signed)
Patient would like placement with [facility] SNF. CSW informed admissions [facility]'s preference. CSW continuing to follow for discharge needs.   Valero EnergyShonny Junette Bernat, LCSW [phone]

## 2016-04-28 NOTE — Progress Notes (Signed)
Occupational Therapy Treatment Patient Details Name: Alan Briggs Collumnn MRN: [id number] DOB: 1922/01/15 Today's Date: 04/28/2016    History of present illness 82 y.o. M Hx DM2, HTN, Pulmonary Hypertension, Sick Sinus Syndrome S/P Permanent Pacemaker 09/2007, OSA, Nephrolithiasis with Hydronephrosis and L Ureteral edema discharged from the hospital 7/3 on antibiotics for possible UTI, urine culture grew multiple species. It was suggested that the pt be d/c to SNF, but due to reported financial issues the pt opted for d/c home instead. Patient got up to walk to the bathroom with his walker and fell to the ground because his legs gave out. His neighbors came and found him after several hours of laying on the ground w/ a laceration of the right foot. Underwent fixation of the right 5th toe on 04/20/16   OT comments  Pt making slow progress, but continues to be very motivated to get stronger and regain independence. Pt completed grooming tasks sitting in recliner and completed several UB and LB exercises in preparation for transfer training. Will continue to follow acutely to address OT needs and goals.   Follow Up Recommendations  SNF    Equipment Recommendations  None recommended by OT    Recommendations for Other Services      Precautions / Restrictions Precautions Precautions: Fall Precaution Comments: pt reports multiple falls at home with knees giving way but so far they have all been onto a safe surface, ie, bed, chair, etc with no serious injury Required Braces or Orthoses: Other Brace/Splint Other Brace/Splint: post op shoe in the room Restrictions Weight Bearing Restrictions: Yes RLE Weight Bearing: Weight bearing as tolerated RLE Partial Weight Bearing Percentage or Pounds: WBAT through heel only       Mobility Bed Mobility               General bed mobility comments: Pt up in chair on OT arrival  Transfers                 General transfer comment: Deferred due to  no +2 assistance available for equipment use    Balance Overall balance assessment: Needs assistance Sitting-balance support: Bilateral upper extremity supported;Feet supported Sitting balance-Leahy Scale: Fair                             ADL Overall ADL's : Needs assistance/impaired     Grooming: Oral care;Set up;Sitting   Upper Body Bathing: Minimal assitance;Sitting   Lower Body Bathing: Total assistance;+2 for physical assistance;Sit to/from stand (with Stedy)   Upper Body Dressing : Minimal assistance;Sitting   Lower Body Dressing: Total assistance;Sit to/from stand;+2 for physical assistance (with Stedy)                 General ADL Comments: Pt noted to have increased bilateral UE swelling which he reports has worsened in last day.      Vision                     Perception     Praxis      Cognition   Behavior During Therapy: WFL for tasks assessed/performed Overall Cognitive Status: Within Functional Limits for tasks assessed                       Extremity/Trunk Assessment               Exercises General Exercises - Upper Extremity Shoulder Flexion: AAROM;Both;10 reps;Seated Shoulder  ABduction: AROM;Both;10 reps;Seated Shoulder ADduction: AROM;Both;10 reps;Seated Elbow Flexion: AROM;Both;10 reps;Seated Elbow Extension: AROM;Both;Seated Wrist Flexion: AROM;Both;10 reps;Seated Wrist Extension: AROM;Both;10 reps;Seated Digit Composite Flexion: AROM;Both;10 reps;Seated Composite Extension: AROM;Both;10 reps;Seated General Exercises - Lower Extremity Ankle Circles/Pumps: AROM;Both;10 reps;Seated Hip Flexion/Marching: AROM;Both;10 reps;Seated   Shoulder Instructions       General Comments      Pertinent Vitals/ Pain       Pain Assessment: Faces Faces Pain Scale: Hurts little more Pain Location: generalized Pain Descriptors / Indicators: Grimacing Pain Intervention(s): Limited activity within patient's  tolerance;Repositioned  Home Living                                          Prior Functioning/Environment              Frequency Min 2X/week     Progress Toward Goals  OT Goals(current goals can now be found in the care plan section)  Progress towards OT goals: Progressing toward goals  Acute Rehab OT Goals Patient Stated Goal: to get stronger and go home asap OT Goal Formulation: With patient/family Time For Goal Achievement: 05/06/16 Potential to Achieve Goals: Good ADL Goals Pt Will Perform Grooming: with set-up;sitting Pt Will Perform Upper Body Bathing: with set-up;sitting Pt Will Perform Lower Body Bathing: with mod assist;with adaptive equipment;sit to/from stand Pt Will Transfer to Toilet: with mod assist;stand pivot transfer;bedside commode Pt Will Perform Toileting - Clothing Manipulation and hygiene: with mod assist;sit to/from stand Pt/caregiver will Perform Home Exercise Program: Increased strength;Right Upper extremity;Left upper extremity;With theraband;With Supervision;With written HEP provided  Plan Discharge plan remains appropriate    Co-evaluation                 End of Session Equipment Utilized During Treatment: Oxygen   Activity Tolerance Patient tolerated treatment well   Patient Left in chair;with call bell/phone within reach;with chair alarm set   Nurse Communication Mobility status        Time: 1527-1600 OT Time Calculation (min): 33 min  Charges: OT General Charges $OT Visit: 1 Procedure OT Treatments $Self Care/Home Management : 8-22 mins $Therapeutic Exercise: 8-22 mins  Nils Pyle, OTR/L Pager: [phone] 04/28/2016, 4:23 PM

## 2016-04-28 NOTE — Progress Notes (Signed)
PROGRESS NOTE    Alan Briggs  ZOX:[id number] DOB: [birth year] DOA: 04/20/2016 PCP: [name], MD   Outpatient Specialists:     Brief Narrative:  Pt. with PMH of DM 2, HTN, pH TM, PPM, OSA, recent UTI with renal stone and hydronephrosis and discharged on antibiotic; admitted on 04/20/2016, with complaint of fall, was found to have laceration of the right foot. Patient is S/P irrigation and debridement of the right foot with complex wound repair and fixation of the right fifth toe dislocation. Patient was also found to be having significant ileus with gastric distention and required a G-tube which was removed 04/26/2016. Foley catheter placed on 04/21/2016 for acute retention of the urine.   Assessment & Plan:   Principal Problem:   Foot laceration Active Problems:   Pancreatic mass   Chronic obstructive pulmonary disease (HCC)   OSA (obstructive sleep apnea)   Controlled diabetes mellitus type 2 with complications (HCC)   Presence of permanent cardiac pacemaker   Dislocation of fifth toe, right, closed   Acute renal failure (HCC)   Foot laceration [facility] took pt for formal I&D, complex wound repair, and fixation of R 5th toe - care per Ortho - patient completed 10 days of antibiotic therapy on 04/26/2016.  Gastric distention with constipation CT scan of the abdomen shows evidence of gastric distention without any gastric outlet obstruction. Possibly ileus. NG tube removed on 04/26/2016. Patient is denying any nausea and vomiting and is actually passing gas- eating. up to chair and ambulate as much as possible - Continue full liquids today  Pancreatic mass. CT scan shows evidence of exophytic cystic structure of the inferior body of the pancreas 4 cm x 4 cm. Outpatient GI follow up  AKI. Due to urinary retention Resolved. Improved with placement of catheter and gentle IV hydration. Patient will need urology follow-up as an outpatient for beginning of the  voiding trial.  SIRS.  patient presented with multiple symptoms concerning for SIRS no evidence of active sepsis. Continue to closely monitor.  Possible bronchitis.  patient complains of cough and shortness of breath and greenish expectoration with mild hypoxia. levaquin x 5 days  Sick Sinus Syndrome S/P pacemaker  COPD -Patient is supposed to use home O2 but is noncompliant  -Titrate O2 to maintain SPO2 89-93%   OSA -pt has been noncompliant with/refusing CPAP   DM2 - Hypoglycemia  -7/1 A1c 5.5 - hypoglycemia resolved - CBG trending upward - adjust tx and follow    B12 deficiency - Macrocytosis -replacing w/ 7 day SQ load of B12 - will need to be followed up as outpt after trial of oral replacement    DVT prophylaxis:    Code Status: DNR   Family Communication: patient  Disposition Plan:  SNF   Consultants:     Procedures:        Subjective: No complaints  Objective: Filed Vitals:   04/27/16 0505 04/27/16 1534 04/27/16 2012 04/28/16 0455  BP: 100/53 165/71 117/68 172/63  Pulse: 71 80 63 76  Temp: 97.7 F (36.5 C) 98.2 F (36.8 C) 98.3 F (36.8 C) 97.5 F (36.4 C)  TempSrc: Oral Oral Oral Oral  Resp: 20 18 24 20   Height:      Weight:    132.5 kg (292 lb 1.8 oz)  SpO2: 98% 96% 98% 98%    Intake/Output Summary (Last 24 hours) at 04/28/16 1438 Last data filed at 04/28/16 0900  Gross per 24 hour  Intake  480 ml  Output    325 ml  Net    155 ml   Filed Weights   04/26/16 0500 04/27/16 0350 04/28/16 0455  Weight: 133.7 kg (294 lb 12.1 oz) 132.6 kg (292 lb 5.3 oz) 132.5 kg (292 lb 1.8 oz)    Examination:  General exam: Appears calm and comfortable  Respiratory system: Clear to auscultation. Respiratory effort normal. Cardiovascular system: S1 & S2 heard, RRR. No JVD, murmurs, rubs, gallops or clicks. No pedal edema. Gastrointestinal system: Abdomen obese, soft and nontender. No organomegaly or masses felt. Normal bowel sounds  heard. Central nervous system: Alert and oriented. No focal neurological deficits. Extremities: Symmetric 5 x 5 power. Skin: No rashes, lesions or ulcers Psychiatry: Judgement and insight appear normal. Mood & affect appropriate.     Data Reviewed: I have personally reviewed following labs and imaging studies  CBC:  Recent Labs Lab 04/22/16 0400 04/25/16 0234 04/28/16 0518  WBC 9.4 8.7 8.5  HGB 12.2* 13.7 12.7*  HCT 37.5* 43.8 40.0  MCV 101.1* 104.3* 102.8*  PLT 129* 149* 137*   Basic Metabolic Panel:  Recent Labs Lab 04/22/16 0400 04/23/16 0816 04/24/16 0813 04/25/16 0234 04/26/16 0458 04/27/16 0420 04/28/16 0518  NA 137 139 144 147* 144 141 143  K 4.7 5.1 6.1* 4.0 3.5 3.6 3.6  CL 104 107 104 105 105 105 105  CO2 26 28 31  35* 31 29 31   GLUCOSE 156* 155* 160* 177* 186* 227* 171*  BUN 54* 37* 29* 23* 22* 21* 20  CREATININE 2.51* 1.06 0.97 0.99 0.89 0.96 0.88  CALCIUM 8.0* 7.7* 8.2* 8.9 8.6* 8.3* 8.6*  MG  --   --   --  2.1  --   --  2.0  PHOS 4.5 2.7 2.5 2.9  --   --   --    GFR: Estimated Creatinine Clearance: 75.5 mL/min (by C-G formula based on Cr of 0.88). Liver Function Tests:  Recent Labs Lab 04/22/16 0400 04/23/16 0816 04/24/16 0813 04/25/16 0234 04/27/16 0420  AST  --   --   --  29 23  ALT  --   --   --  29 27  ALKPHOS  --   --   --  49 46  BILITOT  --   --   --  0.9 0.9  PROT  --   --   --  5.3* 5.4*  ALBUMIN 2.2* 2.0* 2.2* 2.3* 2.3*   No results for input(s): LIPASE, AMYLASE in the last 168 hours. No results for input(s): AMMONIA in the last 168 hours. Coagulation Profile: No results for input(s): INR, PROTIME in the last 168 hours. Cardiac Enzymes: No results for input(s): CKTOTAL, CKMB, CKMBINDEX, TROPONINI in the last 168 hours. BNP (last 3 results) No results for input(s): PROBNP in the last 8760 hours. HbA1C: No results for input(s): HGBA1C in the last 72 hours. CBG:  Recent Labs Lab 04/27/16 1109 04/27/16 1614 04/27/16 2124  04/28/16 0619 04/28/16 1115  GLUCAP 207* 204* 193* 167* 182*   Lipid Profile: No results for input(s): CHOL, HDL, LDLCALC, TRIG, CHOLHDL, LDLDIRECT in the last 72 hours. Thyroid Function Tests: No results for input(s): TSH, T4TOTAL, FREET4, T3FREE, THYROIDAB in the last 72 hours. Anemia Panel: No results for input(s): VITAMINB12, FOLATE, FERRITIN, TIBC, IRON, RETICCTPCT in the last 72 hours. Urine analysis:    Component Value Date/Time   COLORURINE AMBER* 04/20/2016 0130   APPEARANCEUR CLEAR 04/20/2016 0130   LABSPEC 1.025 04/20/2016 0130  PHURINE 5.0 04/20/2016 0130   GLUCOSEU NEGATIVE 04/20/2016 0130   HGBUR MODERATE* 04/20/2016 0130   BILIRUBINUR NEGATIVE 04/20/2016 0130   KETONESUR NEGATIVE 04/20/2016 0130   PROTEINUR NEGATIVE 04/20/2016 0130   NITRITE NEGATIVE 04/20/2016 0130   LEUKOCYTESUR TRACE* 04/20/2016 0130     ) Recent Results (from the past 240 hour(s))  Urine culture     Status: None   Collection Time: 04/20/16  1:30 AM  Result Value Ref Range Status   Specimen Description URINE, CLEAN CATCH  Final   Special Requests NONE  Final   Culture NO GROWTH Performed at [facility]   Final   Report Status 04/21/2016 FINAL  Final  Blood Culture (routine x 2)     Status: None   Collection Time: 04/20/16  2:00 AM  Result Value Ref Range Status   Specimen Description BLOOD RIGHT ANTECUBITAL  Final   Special Requests   Final    BOTTLES DRAWN AEROBIC AND ANAEROBIC AEB 12CC ANA 10CC   Culture NO GROWTH 5 DAYS  Final   Report Status 04/25/2016 FINAL  Final  Blood Culture (routine x 2)     Status: None   Collection Time: 04/20/16  2:15 AM  Result Value Ref Range Status   Specimen Description BLOOD RIGHT FOREARM  Final   Special Requests BOTTLES DRAWN AEROBIC AND ANAEROBIC 10CC EACH  Final   Culture NO GROWTH 5 DAYS  Final   Report Status 04/25/2016 FINAL  Final  MRSA PCR Screening     Status: None   Collection Time: 04/20/16  6:12 AM  Result Value Ref  Range Status   MRSA by PCR NEGATIVE NEGATIVE Final    Comment:        The GeneXpert MRSA Assay (FDA approved for NASAL specimens only), is one component of a comprehensive MRSA colonization surveillance program. It is not intended to diagnose MRSA infection nor to guide or monitor treatment for MRSA infections.       Anti-infectives    Start     Dose/Rate Route Frequency Ordered Stop   04/28/16 1145  levofloxacin (LEVAQUIN) tablet 750 mg     750 mg Oral Daily 04/28/16 1140 05/05/16 0959   04/27/16 1130  levofloxacin (LEVAQUIN) IVPB 750 mg  Status:  Discontinued     750 mg 100 mL/hr over 90 Minutes Intravenous Every 24 hours 04/27/16 1035 04/28/16 1140   04/20/16 1200  clindamycin (CLEOCIN) IVPB 600 mg     600 mg 100 mL/hr over 30 Minutes Intravenous Every 8 hours 04/20/16 1117 04/26/16 2333   04/20/16 0800  aztreonam (AZACTAM) 1 g in dextrose 5 % 50 mL IVPB  Status:  Discontinued     1 g 100 mL/hr over 30 Minutes Intravenous Every 8 hours 04/20/16 0728 04/21/16 1025   04/20/16 0315  vancomycin (VANCOCIN) IVPB 1000 mg/200 mL premix     1,000 mg 200 mL/hr over 60 Minutes Intravenous  Once 04/20/16 0302 04/20/16 1610       Radiology Studies: Ct Abdomen Pelvis W Contrast  04/27/2016  CLINICAL DATA:  Abdominal distention and pain. EXAM: CT ABDOMEN AND PELVIS WITH CONTRAST TECHNIQUE: Multidetector CT imaging of the abdomen and pelvis was performed using the standard protocol following bolus administration of intravenous contrast. CONTRAST:  ISOVUE-300 IOPAMIDOL (ISOVUE-300) INJECTION 61% COMPARISON:  Abdominal radiograph from the same date. FINDINGS: Lower chest: There are bilateral small pleural effusions with subsegmental atelectasis. The heart is mildly enlarged. There is heavy atherosclerotic disease of the coronary  arteries and aorta. Hepatobiliary: There is a 2.1 cm subcapsular right hepatic cyst. No suspicious masses are seen. The gallbladder is normally distended.  Pancreas: The pancreas is mostly fatty replaced. There is a partially exophytic circumscribed cystic structure off of the inferior body of the pancreas measuring 3.9 x 3.8 x 4.0 cm. Spleen: Calcified granuloma of the within the spleen are noted. Additionally there is an area of geographic hypoattenuation in the midportion of the spleen, which may represent a hypoperfusion abnormality. Adrenals/Urinary Tract: Bilateral adrenal hyperplasia. The kidneys demonstrate mild cortical thinning. There is a benign-appearing exophytic right renal cyst off of the inferior pole of the kidney. Stomach/Bowel: No evidence of obstruction, inflammatory process, or abnormal fluid collections. Gaseous distension of the stomach is noted. No structural obstruction of the stomach outlet is seen. Vascular/Lymphatic: No pathologically enlarged lymph nodes. No evidence of abdominal aortic aneurysm. Reproductive: No mass or other significant abnormality. Urinary bladder is decompressed around urinary Foley. Other: None. Musculoskeletal:  No suspicious bone lesions identified. IMPRESSION: Bilateral small pleural effusions with associated subsegmental atelectasis in the lung bases. Advanced atherosclerotic disease of the coronary arteries and aorta. Stable benign-appearing 2.1 cm subcapsular right hepatic cyst. Gaseous distension of the stomach without identifiable cause of gastric outlet obstruction. Stable exophytic cystic lesion off of the distal body of the pancreas, which may represent a side branch IPMN or other indolent primarily pancreatic malignancy. Electronically Signed   By: Ted Mcalpine M.D.   On: 04/27/2016 16:21   Dg Abd Portable 1v  04/27/2016  CLINICAL DATA:  Abdominal distention and pain since early morning. EXAM: PORTABLE ABDOMEN - 1 VIEW COMPARISON:  04/25/2016 FINDINGS: Gaseous distention of the stomach. No small or large bowel distention. Scattered gas and stool in the colon. Prominent vascular calcifications in  the splenic artery. Degenerative changes in the spine. IMPRESSION: Gaseous distention of the stomach may indicate outlet obstruction or gastroparesis. Electronically Signed   By: Burman Nieves M.D.   On: 04/27/2016 06:47        Scheduled Meds: . aspirin EC  81 mg Oral Daily  . cyanocobalamin  1,000 mcg Subcutaneous Daily  . famotidine  20 mg Oral BID  . heparin subcutaneous  5,000 Units Subcutaneous Q8H  . insulin aspart  0-9 Units Subcutaneous TID WC  . insulin glargine  10 Units Subcutaneous QHS  . levofloxacin  750 mg Oral Daily  . polyethylene glycol  17 g Oral BID  . senna-docusate  1 tablet Oral BID  . sodium phosphate  1 enema Rectal Q1500  . sorbitol  30 mL Per Tube BID   Continuous Infusions: . sodium chloride 10 mL/hr at 04/26/16 2303     LOS: 8 days    Time spent: 35 min    Adriana Quinby Juanetta Gosling, DO [facility] Pager [phone]  If 7PM-7AM, please contact night-coverage [email or web address] Password [facility] 04/28/2016, 2:38 PM

## 2016-04-28 NOTE — Progress Notes (Signed)
Patient refuses CPAP use while in hospital. RT will continue to monitor as needed.

## 2016-04-29 DIAGNOSIS — K869 Disease of pancreas, unspecified: Secondary | ICD-10-CM

## 2016-04-29 DIAGNOSIS — N179 Acute kidney failure, unspecified: Secondary | ICD-10-CM

## 2016-04-29 LAB — GLUCOSE, CAPILLARY
GLUCOSE-CAPILLARY: 145 mg/dL — AB (ref 65–99)
Glucose-Capillary: 159 mg/dL — ABNORMAL HIGH (ref 65–99)
Glucose-Capillary: 206 mg/dL — ABNORMAL HIGH (ref 65–99)
Glucose-Capillary: 162 mg/dL — ABNORMAL HIGH (ref 65–99)

## 2016-04-29 MED ORDER — LACTULOSE 10 GM/15ML PO SOLN
20.0000 g | Freq: Two times a day (BID) | ORAL | Status: DC
  Filled 2016-04-29 (×3): qty 30

## 2016-04-29 MED ORDER — SORBITOL 70 % SOLN: 30.0000 mL | Freq: Two times a day (BID) | Status: DC

## 2016-04-29 NOTE — Progress Notes (Signed)
Physical Therapy Treatment Patient Details Name: Alan Briggs MRN: [id number] DOB: [birth year] Today's Date: 04/29/2016    History of Present Illness 82 y.o. M Hx DM2, HTN, Pulmonary Hypertension, Sick Sinus Syndrome S/P Permanent Pacemaker 09/2007, OSA, Nephrolithiasis with Hydronephrosis and L Ureteral edema discharged from the hospital 7/3 on antibiotics for possible UTI, urine culture grew multiple species. It was suggested that the pt be d/c to SNF, but due to reported financial issues the pt opted for d/c home instead. Patient got up to walk to the bathroom with his walker and fell to the ground because his legs gave out. His neighbors came and found him after several hours of laying on the ground w/ a laceration of the right foot. Underwent fixation of the right 5th toe on 04/20/16    PT Comments    Pt making slow, steady progress.  Follow Up Recommendations  SNF     Equipment Recommendations  None recommended by PT    Recommendations for Other Services       Precautions / Restrictions Precautions Precautions: Fall Required Braces or Orthoses: Other Brace/Splint Other Brace/Splint: post op shoe  Restrictions Weight Bearing Restrictions: Yes RLE Weight Bearing: Weight bearing as tolerated RLE Partial Weight Bearing Percentage or Pounds: Weight bearig as tolrated on heel only    Mobility  Bed Mobility Overal bed mobility: Needs Assistance Bed Mobility: Supine to Sit     Supine to sit: +2 for physical assistance;Mod assist     General bed mobility comments: Assist to bring legs off bed, elevate trunk into sitting and bring hips to EOB.  Transfers Overall transfer level: Needs assistance Equipment used: Ambulation equipment used;Rolling walker (2 wheeled) Transfers: Sit to/from [employer] Transfers Sit to Stand: +2 physical assistance;Mod assist;Min assist Stand pivot transfers: Max assist;+2 physical assistance (with Stedy)       General transfer  comment: Assist to bring hips up. With Stedy pt able to stand from bed with min A. From chair pt stood with +2 mod A and cues for hand placement. Pt with difficulty transitioning hands from armrests of chair to the walker.  Ambulation/Gait             General Gait Details: Stood with walker but pt unable to take any steps.   Stairs            Wheelchair Mobility    Modified Rankin (Stroke Patients Only)       Balance Overall balance assessment: Needs assistance Sitting-balance support: No upper extremity supported;Feet supported Sitting balance-Leahy Scale: Fair     Standing balance support: Bilateral upper extremity supported Standing balance-Leahy Scale: Poor Standing balance comment: UE support and min A for static standing. Stood with Stedy and with walker x 30-45 sec each.                    Cognition Arousal/Alertness: Awake/alert Behavior During Therapy: WFL for tasks assessed/performed Overall Cognitive Status: Within Functional Limits for tasks assessed                      Exercises      General Comments        Pertinent Vitals/Pain Faces Pain Scale: Hurts little more    Home Living                      Prior Function            PT Goals (current goals can now  be found in the care plan section) Progress towards PT goals: Progressing toward goals    Frequency  Min 2X/week    PT Plan Current plan remains appropriate;Frequency needs to be updated    Co-evaluation             End of Session Equipment Utilized During Treatment: Oxygen;Gait belt Activity Tolerance: Patient tolerated treatment well Patient left: with call bell/phone within reach;in chair;with chair alarm set     Time: [id number] PT Time Calculation (min) (ACUTE ONLY): 19 min  Charges:  $Therapeutic Activity: 8-22 mins                    G Codes:      Shyrl Obi 04/29/2016, 11:42 AM Skip Mayerary Destani Wamser PT [phone]

## 2016-04-29 NOTE — Progress Notes (Signed)
Patient lying in bed, no needs at this time. Call light within reach. 

## 2016-04-29 NOTE — Care Management Important Message (Signed)
Important Message  Patient Details  Name: Cora CollumRobert C Harsha MRN: [id number] Date of Birth: 11-11-1921   Medicare Important Message Given:  Yes    Stephane Junkins Abena 04/29/2016, 1:28 PM

## 2016-04-29 NOTE — Progress Notes (Signed)
Patient refuses CPAP 

## 2016-04-29 NOTE — Progress Notes (Signed)
Patient's son Greer PickerelRobert Jr called inquiring of where patient will be sent.  Was concerned that father had no information. Advised him that he should be discharged tomorrow to [facility]. His number is [phone] in case of any changes.

## 2016-04-29 NOTE — Progress Notes (Signed)
PROGRESS NOTE    Alan Briggs  ZOX:[id number] DOB: 09-07-1922 DOA: 04/20/2016 PCP: [name], MD   Outpatient Specialists:     Brief Narrative:  Pt. with PMH of DM 2, HTN, pH TM, PPM, OSA, recent UTI with renal stone and hydronephrosis and discharged on antibiotic; admitted on 04/20/2016, with complaint of fall, was found to have laceration of the right foot. Patient is S/P irrigation and debridement of the right foot with complex wound repair and fixation of the right fifth toe dislocation. Patient was also found to be having significant ileus with gastric distention and required a G-tube which was removed 04/26/2016. Foley catheter placed on 04/21/2016 for acute retention of the urine.   Assessment & Plan:   Principal Problem:   Foot laceration Active Problems:   Pancreatic mass   Chronic obstructive pulmonary disease (HCC)   OSA (obstructive sleep apnea)   Controlled diabetes mellitus type 2 with complications (HCC)   Presence of permanent cardiac pacemaker   Dislocation of fifth toe, right, closed   Acute renal failure (HCC)   Foot laceration [facility] took pt for formal I&D, complex wound repair, and fixation of R 5th toe - care per Ortho - patient completed 10 days of antibiotic therapy on 04/26/2016.  Gastric distention with constipation CT scan of the abdomen shows evidence of gastric distention without any gastric outlet obstruction. Possibly ileus- DG abd resolved -- advance diet NG tube removed on 04/26/2016. Patient is denying any nausea and vomiting and is actually passing gas- eating. up to chair and ambulate as much as possible -  Pancreatic mass. CT scan shows evidence of exophytic cystic structure of the inferior body of the pancreas 4 cm x 4 cm. Outpatient GI follow up  AKI. Due to urinary retention Resolved. Improved with placement of catheter and gentle IV hydration. Patient will need urology follow-up as an outpatient for beginning of  the voiding trial.  SIRS.  patient presented with multiple symptoms concerning for SIRS no evidence of active sepsis. Continue to closely monitor.  Possible bronchitis.  patient complains of cough and shortness of breath and greenish expectoration with mild hypoxia. levaquin x 5 days  Sick Sinus Syndrome S/P pacemaker  COPD -Patient is supposed to use home O2 but is noncompliant  -Titrate O2 to maintain SPO2 89-93%   OSA -pt has been noncompliant with/refusing CPAP   DM2 - Hypoglycemia  -7/1 A1c 5.5 - hypoglycemia resolved - CBG trending upward - adjust tx and follow    B12 deficiency - Macrocytosis -replacing w/ 7 day SQ load of B12 - will need to be followed up as outpt after trial of oral replacement    DVT prophylaxis:    Code Status: DNR   Family Communication: Patient- called no Tresa Endo-- no answer  Disposition Plan:  SNF in AM   Consultants:     Procedures:        Subjective: No complaints  Objective: Filed Vitals:   04/28/16 2118 04/29/16 0445 04/29/16 1004 04/29/16 1300  BP: 152/72 135/53 118/53 137/63  Pulse: 75 64 75 81  Temp: 97.8 F (36.6 C) 97.8 F (36.6 C) 97.4 F (36.3 C) 98 F (36.7 C)  TempSrc: Oral Oral Oral Oral  Resp: 20 20 20 18   Height:      Weight:  132.2 kg (291 lb 7.2 oz)    SpO2: 97% 97% 98% 98%    Intake/Output Summary (Last 24 hours) at 04/29/16 1406 Last data filed at 04/29/16  1242  Gross per 24 hour  Intake    740 ml  Output    950 ml  Net   -210 ml   Filed Weights   04/27/16 0350 04/28/16 0455 04/29/16 0445  Weight: 132.6 kg (292 lb 5.3 oz) 132.5 kg (292 lb 1.8 oz) 132.2 kg (291 lb 7.2 oz)    Examination:  General exam: Appears calm and comfortable  Respiratory system: Clear to auscultation. Respiratory effort normal. Cardiovascular system: S1 & S2 heard, RRR. No JVD, murmurs, rubs, gallops or clicks. No pedal edema. Gastrointestinal system: Abdomen obese, soft and nontender. No organomegaly or  masses felt. Normal bowel sounds heard. Central nervous system: Alert and oriented. No focal neurological deficits. Extremities: Symmetric 5 x 5 power. Skin: No rashes, lesions or ulcers Psychiatry: Judgement and insight appear normal. Mood & affect appropriate.     Data Reviewed: I have personally reviewed following labs and imaging studies  CBC:  Recent Labs Lab 04/25/16 0234 04/28/16 0518  WBC 8.7 8.5  HGB 13.7 12.7*  HCT 43.8 40.0  MCV 104.3* 102.8*  PLT 149* 137*   Basic Metabolic Panel:  Recent Labs Lab 04/23/16 0816 04/24/16 0813 04/25/16 0234 04/26/16 0458 04/27/16 0420 04/28/16 0518  NA 139 144 147* 144 141 143  K 5.1 6.1* 4.0 3.5 3.6 3.6  CL 107 104 105 105 105 105  CO2 28 31 35* 31 29 31   GLUCOSE 155* 160* 177* 186* 227* 171*  BUN 37* 29* 23* 22* 21* 20  CREATININE 1.06 0.97 0.99 0.89 0.96 0.88  CALCIUM 7.7* 8.2* 8.9 8.6* 8.3* 8.6*  MG  --   --  2.1  --   --  2.0  PHOS 2.7 2.5 2.9  --   --   --    GFR: Estimated Creatinine Clearance: 75.3 mL/min (by C-G formula based on Cr of 0.88). Liver Function Tests:  Recent Labs Lab 04/23/16 0816 04/24/16 0813 04/25/16 0234 04/27/16 0420  AST  --   --  29 23  ALT  --   --  29 27  ALKPHOS  --   --  49 46  BILITOT  --   --  0.9 0.9  PROT  --   --  5.3* 5.4*  ALBUMIN 2.0* 2.2* 2.3* 2.3*   No results for input(s): LIPASE, AMYLASE in the last 168 hours. No results for input(s): AMMONIA in the last 168 hours. Coagulation Profile: No results for input(s): INR, PROTIME in the last 168 hours. Cardiac Enzymes: No results for input(s): CKTOTAL, CKMB, CKMBINDEX, TROPONINI in the last 168 hours. BNP (last 3 results) No results for input(s): PROBNP in the last 8760 hours. HbA1C: No results for input(s): HGBA1C in the last 72 hours. CBG:  Recent Labs Lab 04/28/16 1115 04/28/16 1626 04/28/16 2116 04/29/16 0607 04/29/16 1124  GLUCAP 182* 195* 165* 159* 206*   Lipid Profile: No results for input(s):  CHOL, HDL, LDLCALC, TRIG, CHOLHDL, LDLDIRECT in the last 72 hours. Thyroid Function Tests: No results for input(s): TSH, T4TOTAL, FREET4, T3FREE, THYROIDAB in the last 72 hours. Anemia Panel: No results for input(s): VITAMINB12, FOLATE, FERRITIN, TIBC, IRON, RETICCTPCT in the last 72 hours. Urine analysis:    Component Value Date/Time   COLORURINE AMBER* 04/20/2016 0130   APPEARANCEUR CLEAR 04/20/2016 0130   LABSPEC 1.025 04/20/2016 0130   PHURINE 5.0 04/20/2016 0130   GLUCOSEU NEGATIVE 04/20/2016 0130   HGBUR MODERATE* 04/20/2016 0130   BILIRUBINUR NEGATIVE 04/20/2016 0130   KETONESUR NEGATIVE 04/20/2016 0130  PROTEINUR NEGATIVE 04/20/2016 0130   NITRITE NEGATIVE 04/20/2016 0130   LEUKOCYTESUR TRACE* 04/20/2016 0130     ) Recent Results (from the past 240 hour(s))  Urine culture     Status: None   Collection Time: 04/20/16  1:30 AM  Result Value Ref Range Status   Specimen Description URINE, CLEAN CATCH  Final   Special Requests NONE  Final   Culture NO GROWTH Performed at [facility]   Final   Report Status 04/21/2016 FINAL  Final  Blood Culture (routine x 2)     Status: None   Collection Time: 04/20/16  2:00 AM  Result Value Ref Range Status   Specimen Description BLOOD RIGHT ANTECUBITAL  Final   Special Requests   Final    BOTTLES DRAWN AEROBIC AND ANAEROBIC AEB 12CC ANA 10CC   Culture NO GROWTH 5 DAYS  Final   Report Status 04/25/2016 FINAL  Final  Blood Culture (routine x 2)     Status: None   Collection Time: 04/20/16  2:15 AM  Result Value Ref Range Status   Specimen Description BLOOD RIGHT FOREARM  Final   Special Requests BOTTLES DRAWN AEROBIC AND ANAEROBIC 10CC EACH  Final   Culture NO GROWTH 5 DAYS  Final   Report Status 04/25/2016 FINAL  Final  MRSA PCR Screening     Status: None   Collection Time: 04/20/16  6:12 AM  Result Value Ref Range Status   MRSA by PCR NEGATIVE NEGATIVE Final    Comment:        The GeneXpert MRSA Assay (FDA approved  for NASAL specimens only), is one component of a comprehensive MRSA colonization surveillance program. It is not intended to diagnose MRSA infection nor to guide or monitor treatment for MRSA infections.       Anti-infectives    Start     Dose/Rate Route Frequency Ordered Stop   04/28/16 1145  levofloxacin (LEVAQUIN) tablet 750 mg     750 mg Oral Daily 04/28/16 1140 05/05/16 0959   04/27/16 1130  levofloxacin (LEVAQUIN) IVPB 750 mg  Status:  Discontinued     750 mg 100 mL/hr over 90 Minutes Intravenous Every 24 hours 04/27/16 1035 04/28/16 1140   04/20/16 1200  clindamycin (CLEOCIN) IVPB 600 mg     600 mg 100 mL/hr over 30 Minutes Intravenous Every 8 hours 04/20/16 1117 04/26/16 2333   04/20/16 0800  aztreonam (AZACTAM) 1 g in dextrose 5 % 50 mL IVPB  Status:  Discontinued     1 g 100 mL/hr over 30 Minutes Intravenous Every 8 hours 04/20/16 0728 04/21/16 1025   04/20/16 0315  vancomycin (VANCOCIN) IVPB 1000 mg/200 mL premix     1,000 mg 200 mL/hr over 60 Minutes Intravenous  Once 04/20/16 0302 04/20/16 2725       Radiology Studies: Ct Abdomen Pelvis W Contrast  04/27/2016  CLINICAL DATA:  Abdominal distention and pain. EXAM: CT ABDOMEN AND PELVIS WITH CONTRAST TECHNIQUE: Multidetector CT imaging of the abdomen and pelvis was performed using the standard protocol following bolus administration of intravenous contrast. CONTRAST:  ISOVUE-300 IOPAMIDOL (ISOVUE-300) INJECTION 61% COMPARISON:  Abdominal radiograph from the same date. FINDINGS: Lower chest: There are bilateral small pleural effusions with subsegmental atelectasis. The heart is mildly enlarged. There is heavy atherosclerotic disease of the coronary arteries and aorta. Hepatobiliary: There is a 2.1 cm subcapsular right hepatic cyst. No suspicious masses are seen. The gallbladder is normally distended. Pancreas: The pancreas is mostly fatty replaced.  There is a partially exophytic circumscribed cystic structure off of  the inferior body of the pancreas measuring 3.9 x 3.8 x 4.0 cm. Spleen: Calcified granuloma of the within the spleen are noted. Additionally there is an area of geographic hypoattenuation in the midportion of the spleen, which may represent a hypoperfusion abnormality. Adrenals/Urinary Tract: Bilateral adrenal hyperplasia. The kidneys demonstrate mild cortical thinning. There is a benign-appearing exophytic right renal cyst off of the inferior pole of the kidney. Stomach/Bowel: No evidence of obstruction, inflammatory process, or abnormal fluid collections. Gaseous distension of the stomach is noted. No structural obstruction of the stomach outlet is seen. Vascular/Lymphatic: No pathologically enlarged lymph nodes. No evidence of abdominal aortic aneurysm. Reproductive: No mass or other significant abnormality. Urinary bladder is decompressed around urinary Foley. Other: None. Musculoskeletal:  No suspicious bone lesions identified. IMPRESSION: Bilateral small pleural effusions with associated subsegmental atelectasis in the lung bases. Advanced atherosclerotic disease of the coronary arteries and aorta. Stable benign-appearing 2.1 cm subcapsular right hepatic cyst. Gaseous distension of the stomach without identifiable cause of gastric outlet obstruction. Stable exophytic cystic lesion off of the distal body of the pancreas, which may represent a side branch IPMN or other indolent primarily pancreatic malignancy. Electronically Signed   By: Ted Mcalpine M.D.   On: 04/27/2016 16:21   Dg Abd Portable 1v  04/28/2016  CLINICAL DATA:  Gastric distension, gastric pain EXAM: PORTABLE ABDOMEN - 1 VIEW COMPARISON:  None. FINDINGS: The bowel gas pattern is normal. No radio-opaque calculi or other significant radiographic abnormality are seen. IMPRESSION: Negative. Electronically Signed   By: Elige Ko   On: 04/28/2016 21:31        Scheduled Meds: . aspirin EC  81 mg Oral Daily  . famotidine  20 mg Oral  BID  . heparin subcutaneous  5,000 Units Subcutaneous Q8H  . insulin aspart  0-9 Units Subcutaneous TID WC  . insulin glargine  10 Units Subcutaneous QHS  . levofloxacin  750 mg Oral Daily  . polyethylene glycol  17 g Oral BID  . senna-docusate  1 tablet Oral BID  . sodium phosphate  1 enema Rectal Q1500  . sorbitol  30 mL Oral BID   Continuous Infusions: . sodium chloride 10 mL/hr at 04/26/16 2303     LOS: 9 days    Time spent: 25 min    JESSICA Juanetta Gosling, DO [facility] Pager [phone]  If 7PM-7AM, please contact night-coverage [email or web address] Password [email or web address] 04/29/2016, 2:06 PM

## 2016-04-30 ENCOUNTER — Inpatient Hospital Stay
Admission: RE | Admit: 2016-04-30 | Discharge: 2016-07-01 | Disposition: A | Discharge status: Nursing Home (Includes ICF) | Payer: Medicare Other | Source: Ambulatory Visit | Attending: Internal Medicine | Admitting: Internal Medicine

## 2016-04-30 DIAGNOSIS — R1312 Dysphagia, oropharyngeal phase: Secondary | ICD-10-CM | POA: Diagnosis not present

## 2016-04-30 DIAGNOSIS — Z794 Long term (current) use of insulin: Secondary | ICD-10-CM | POA: Diagnosis not present

## 2016-04-30 DIAGNOSIS — Z4789 Encounter for other orthopedic aftercare: Secondary | ICD-10-CM | POA: Diagnosis not present

## 2016-04-30 DIAGNOSIS — Z950 Presence of cardiac pacemaker: Secondary | ICD-10-CM | POA: Diagnosis not present

## 2016-04-30 DIAGNOSIS — K862 Cyst of pancreas: Secondary | ICD-10-CM

## 2016-04-30 DIAGNOSIS — Z9181 History of falling: Secondary | ICD-10-CM | POA: Diagnosis not present

## 2016-04-30 DIAGNOSIS — R339 Retention of urine, unspecified: Secondary | ICD-10-CM | POA: Diagnosis not present

## 2016-04-30 DIAGNOSIS — R194 Change in bowel habit: Secondary | ICD-10-CM | POA: Diagnosis not present

## 2016-04-30 DIAGNOSIS — Z5189 Encounter for other specified aftercare: Secondary | ICD-10-CM | POA: Diagnosis not present

## 2016-04-30 DIAGNOSIS — E119 Type 2 diabetes mellitus without complications: Secondary | ICD-10-CM | POA: Diagnosis not present

## 2016-04-30 DIAGNOSIS — S91311S Laceration without foreign body, right foot, sequela: Secondary | ICD-10-CM | POA: Diagnosis not present

## 2016-04-30 DIAGNOSIS — S93104D Unspecified dislocation of right toe(s), subsequent encounter: Secondary | ICD-10-CM | POA: Diagnosis not present

## 2016-04-30 DIAGNOSIS — R2689 Other abnormalities of gait and mobility: Secondary | ICD-10-CM | POA: Diagnosis not present

## 2016-04-30 DIAGNOSIS — K560 Paralytic ileus: Secondary | ICD-10-CM | POA: Diagnosis not present

## 2016-04-30 DIAGNOSIS — R262 Difficulty in walking, not elsewhere classified: Secondary | ICD-10-CM | POA: Diagnosis not present

## 2016-04-30 DIAGNOSIS — E118 Type 2 diabetes mellitus with unspecified complications: Secondary | ICD-10-CM | POA: Diagnosis not present

## 2016-04-30 DIAGNOSIS — D508 Other iron deficiency anemias: Secondary | ICD-10-CM | POA: Diagnosis not present

## 2016-04-30 DIAGNOSIS — I5032 Chronic diastolic (congestive) heart failure: Secondary | ICD-10-CM | POA: Diagnosis not present

## 2016-04-30 DIAGNOSIS — J42 Unspecified chronic bronchitis: Secondary | ICD-10-CM | POA: Diagnosis not present

## 2016-04-30 DIAGNOSIS — D649 Anemia, unspecified: Secondary | ICD-10-CM | POA: Diagnosis not present

## 2016-04-30 DIAGNOSIS — B370 Candidal stomatitis: Secondary | ICD-10-CM | POA: Diagnosis not present

## 2016-04-30 DIAGNOSIS — R279 Unspecified lack of coordination: Secondary | ICD-10-CM | POA: Diagnosis not present

## 2016-04-30 DIAGNOSIS — R293 Abnormal posture: Secondary | ICD-10-CM | POA: Diagnosis not present

## 2016-04-30 DIAGNOSIS — R259 Unspecified abnormal involuntary movements: Secondary | ICD-10-CM | POA: Diagnosis not present

## 2016-04-30 DIAGNOSIS — S92124D Nondisplaced fracture of body of right talus, subsequent encounter for fracture with routine healing: Secondary | ICD-10-CM | POA: Diagnosis not present

## 2016-04-30 DIAGNOSIS — N1330 Unspecified hydronephrosis: Secondary | ICD-10-CM | POA: Diagnosis not present

## 2016-04-30 DIAGNOSIS — S91311D Laceration without foreign body, right foot, subsequent encounter: Secondary | ICD-10-CM | POA: Diagnosis not present

## 2016-04-30 DIAGNOSIS — I872 Venous insufficiency (chronic) (peripheral): Secondary | ICD-10-CM | POA: Diagnosis not present

## 2016-04-30 DIAGNOSIS — J449 Chronic obstructive pulmonary disease, unspecified: Secondary | ICD-10-CM | POA: Diagnosis not present

## 2016-04-30 DIAGNOSIS — S93134D Subluxation of interphalangeal joint of right lesser toe(s), subsequent encounter: Secondary | ICD-10-CM | POA: Diagnosis not present

## 2016-04-30 DIAGNOSIS — Z87442 Personal history of urinary calculi: Secondary | ICD-10-CM | POA: Diagnosis not present

## 2016-04-30 DIAGNOSIS — I10 Essential (primary) hypertension: Secondary | ICD-10-CM | POA: Diagnosis not present

## 2016-04-30 DIAGNOSIS — G4733 Obstructive sleep apnea (adult) (pediatric): Secondary | ICD-10-CM | POA: Diagnosis not present

## 2016-04-30 DIAGNOSIS — D519 Vitamin B12 deficiency anemia, unspecified: Secondary | ICD-10-CM | POA: Diagnosis not present

## 2016-04-30 DIAGNOSIS — E876 Hypokalemia: Secondary | ICD-10-CM | POA: Diagnosis not present

## 2016-04-30 DIAGNOSIS — Z1389 Encounter for screening for other disorder: Secondary | ICD-10-CM | POA: Diagnosis not present

## 2016-04-30 DIAGNOSIS — M6281 Muscle weakness (generalized): Secondary | ICD-10-CM | POA: Diagnosis not present

## 2016-04-30 DIAGNOSIS — K567 Ileus, unspecified: Secondary | ICD-10-CM

## 2016-04-30 DIAGNOSIS — K5909 Other constipation: Secondary | ICD-10-CM | POA: Diagnosis not present

## 2016-04-30 DIAGNOSIS — D500 Iron deficiency anemia secondary to blood loss (chronic): Secondary | ICD-10-CM | POA: Diagnosis not present

## 2016-04-30 DIAGNOSIS — K869 Disease of pancreas, unspecified: Secondary | ICD-10-CM | POA: Diagnosis not present

## 2016-04-30 DIAGNOSIS — K219 Gastro-esophageal reflux disease without esophagitis: Secondary | ICD-10-CM | POA: Diagnosis not present

## 2016-04-30 LAB — CBC
HCT: 38.8 % — ABNORMAL LOW (ref 39.0–52.0)
Hemoglobin: 12.3 g/dL — ABNORMAL LOW (ref 13.0–17.0)
MCH: 32.2 pg (ref 26.0–34.0)
MCHC: 31.7 g/dL (ref 30.0–36.0)
MCV: 101.6 fL — AB (ref 78.0–100.0)
PLATELETS: 139 10*3/uL — AB (ref 150–400)
RBC: 3.82 MIL/uL — AB (ref 4.22–5.81)
RDW: 15.4 % (ref 11.5–15.5)
WBC: 8.1 10*3/uL (ref 4.0–10.5)

## 2016-04-30 LAB — BASIC METABOLIC PANEL
ANION GAP: 9 (ref 5–15)
BUN: 17 mg/dL (ref 6–20)
CALCIUM: 8.6 mg/dL — AB (ref 8.9–10.3)
CO2: 31 mmol/L (ref 22–32)
Chloride: 99 mmol/L — ABNORMAL LOW (ref 101–111)
Creatinine, Ser: 0.8 mg/dL (ref 0.61–1.24)
GFR calc Af Amer: >60 mL/min (ref >60)
GLUCOSE: 159 mg/dL — AB (ref 65–99)
Potassium: 3.3 mmol/L — ABNORMAL LOW (ref 3.5–5.1)
SODIUM: 139 mmol/L (ref 135–145)

## 2016-04-30 LAB — GLUCOSE, CAPILLARY
GLUCOSE-CAPILLARY: 157 mg/dL — AB (ref 65–99)
Glucose-Capillary: 186 mg/dL — ABNORMAL HIGH (ref 65–99)

## 2016-04-30 MED ORDER — LACTULOSE 10 GM/15ML PO SOLN: 20.0000 g | Freq: Two times a day (BID) | ORAL | Status: DC

## 2016-04-30 MED ORDER — INSULIN GLARGINE 100 UNIT/ML ~~LOC~~ SOLN: 10.0000 [IU] | Freq: Every day | SUBCUTANEOUS | Status: DC

## 2016-04-30 MED ORDER — POLYETHYLENE GLYCOL 3350 17 G PO PACK: 17.0000 g | PACK | Freq: Two times a day (BID) | ORAL | Status: DC

## 2016-04-30 MED ORDER — INSULIN ASPART 100 UNIT/ML ~~LOC~~ SOLN: 0.0000 [IU] | Freq: Three times a day (TID) | SUBCUTANEOUS | Status: DC

## 2016-04-30 MED ORDER — HYDROCORTISONE 2.5 % RE CREA: TOPICAL_CREAM | Freq: Two times a day (BID) | RECTAL | Status: DC | PRN

## 2016-04-30 MED ORDER — SENNOSIDES-DOCUSATE SODIUM 8.6-50 MG PO TABS: 1.0000 | ORAL_TABLET | Freq: Two times a day (BID) | ORAL | Status: DC

## 2016-04-30 MED ORDER — LEVOFLOXACIN 750 MG PO TABS: 750.0000 mg | ORAL_TABLET | Freq: Every day | ORAL | Status: DC

## 2016-04-30 MED ORDER — FAMOTIDINE 20 MG PO TABS: 20.0000 mg | ORAL_TABLET | Freq: Two times a day (BID) | ORAL | Status: DC

## 2016-04-30 MED ORDER — POTASSIUM CHLORIDE CRYS ER 20 MEQ PO TBCR
40.0000 meq | EXTENDED_RELEASE_TABLET | Freq: Once | ORAL | Status: DC
  Filled 2016-04-30: qty 2

## 2016-04-30 NOTE — Care Management Note (Signed)
Case Management Note Previous CM note initiated by  Magdalene RiverHenrietta T Mayo , RN CM   Patient Details  Name: Cora CollumRobert C Bahar MRN: [id number] Date of Birth: 1922/09/27  Subjective/Objective:   Pt initially refused SNF for rehab but after talking with attending, is now agreeable, [facility] or [facility].  Also states he did not work with PT today but will work with her tomorrow.  Son is very pleased that he now agrees, states pt's weakness and refusal of rehab @ SNF after recent hospitalization @ [facility] is the reason pt fell and required a second hospitalization.                         Action/Plan: CSW following for SNF placement-   Expected Discharge Date:       04/30/16           Expected Discharge Plan:  Skilled Nursing Facility  In-House Referral:  Clinical Social Work  Discharge planning Services  CM Consult  Post Acute Care Choice:    Choice offered to:     DME Arranged:    DME Agency:     HH Arranged:    HH Agency:     Status of Service:  Completed, signed off  If discussed at [employer] Meetings, dates discussed:    Additional Comments:  04/30/16- pt stable for d/c to SNF today- CSW following  Darrold SpanWebster, [place] Cancro Hall, RN 04/30/2016, 11:58 AM [phone]

## 2016-04-30 NOTE — Clinical Social Work Placement (Signed)
   CLINICAL SOCIAL WORK PLACEMENT  NOTE  Date:  04/30/2016  Patient Details  Name: Alan CollumRobert C Mallo MRN: [id number] Date of Birth: 1921/11/30  Clinical Social Work is seeking post-discharge placement for this patient at the Skilled  Nursing Facility level of care (*CSW will initial, date and re-position this form in  chart as items are completed):  Yes   Patient/family provided with [facility] Clinical Social Work Department's list of facilities offering this level of care within the geographic area requested by the patient (or if unable, by the patient's family).  Yes   Patient/family informed of their freedom to choose among providers that offer the needed level of care, that participate in Medicare, Medicaid or managed care program needed by the patient, have an available bed and are willing to accept the patient.  Yes   Patient/family informed of [facility]'s ownership interest in [facility] and [facility], as well as of the fact that they are under no obligation to receive care at these facilities.  PASRR submitted to EDS on       PASRR number received on       Existing PASRR number confirmed on 04/21/16     FL2 transmitted to all facilities in geographic area requested by pt/family on 04/21/16     FL2 transmitted to all facilities within larger geographic area on       Patient informed that his/her managed care company has contracts with or will negotiate with certain facilities, including the following:        Yes   Patient/family informed of bed offers received.  [facility]     Physician recommends and patient chooses bed at      [facility].  Patient to be transferred to facility by Ambulance      Patient family notified on 04/30/16 of transfer.  Name of family member notified:  Hazelett Jr,Vishruth     PHYSICIAN Please sign DNR, Please prepare priority discharge summary, including  medications, Please sign FL2, Please prepare prescriptions     Additional Comment:  Per MD [facility]. RN, patient, patient's family, and facility notified of discharge. RN given phone number for report and transport packet is on patient's chart. Ambulance transport requested. CSW signing off.    _______________________________________________ Reggy EyeLaShonda A Dmiyah Liscano, LCSW 04/30/2016, 2:03 PM

## 2016-04-30 NOTE — Discharge Summary (Addendum)
Physician Discharge Summary  Alan Briggs:[id number] DOB: 08-20-1922 DOA: 04/20/2016  PCP: Alan Melena, MD  Admit date: 04/20/2016 Discharge date: 04/30/2016   Recommendations for Outpatient Follow-Up:   2L O2 levaquin until 7/16 BMP 1 week DNR Bowel regimen for daily BMs If continues to be failure to thrive, may need palliative eval/hospice referral Outpatient GI follow up for pancreatic cyst Post-op shoe Outpatient voiding trial  Discharge Diagnosis:   Principal Problem:   Foot laceration Active Problems:   Pancreatic mass   Chronic obstructive pulmonary disease (HCC)   OSA (obstructive sleep apnea)   Controlled diabetes mellitus type 2 with complications (HCC)   Presence of permanent cardiac pacemaker   Dislocation of fifth toe, right, closed   Acute renal failure (HCC)   Ileus (HCC)   Pancreatic cyst   Discharge disposition:  SNF:  Discharge Condition: Improved.  Diet recommendation: Low sodium, heart healthy.    Wound care: daily dressing changes or more if soiled   History of Present Illness:   Alan Briggs is a 82 y.o. male, With history of diabetes mellitus, hypertension, kidney stones with hydronephrosis and ureteral edema on left side who was just discharged from the hospital yesterday. Patient was sent home on antibiotics for possible UTI, urine culture grew multiple species. Patient lives by himself today when he got up to walk to the bathroom with his walker and he tried to sit down on the commode he fell to the ground because legs gave out. Patient was unable to get up from the ground and neighbors came and found him after several hours of laying on the ground by himself. Patient was found to have laceration in the right foot. He denies chest pain or shortness of breath. In the ED patient was found to be hypotensive and prehospital blood glucose was 88.   Hospital Course by Problem:   Foot laceration Dr[facility] took pt for formal  I&D, complex wound repair, and fixation of R 5th toe - care per Ortho - patient completed 10 days of antibiotic therapy on 04/26/2016.  Gastric distention with constipation CT scan of the abdomen shows evidence of gastric distention without any gastric outlet obstruction. Possibly ileus- DG abd resolved -- advance diet NG tube removed on 04/26/2016. Patient is denying any nausea and vomiting and is actually passing gas- eating. up to chair and ambulate as much as possible -  Pancreatic mass. CT scan shows evidence of exophytic cystic structure of the inferior body of the pancreas 4 cm x 4 cm. Outpatient GI follow up  AKI. Due to urinary retention Resolved. Improved with placement of catheter and gentle IV hydration. Patient will need urology follow-up as an outpatient for beginning of the voiding trial.  SIRS.  patient presented with multiple symptoms concerning for SIRS no evidence of active sepsis. Continue to closely monitor.  Possible bronchitis.  patient complains of cough and shortness of breath and greenish expectoration with mild hypoxia. levaquin x 5 days- stop date above  Sick Sinus Syndrome S/P pacemaker  COPD -Patient is supposed to use home O2 but is noncompliant  -Titrate O2 to maintain SPO2 89-93%  OSA -pt has been noncompliant with/refusing CPAP  DM2 - Hypoglycemia  -7/1 A1c 5.5 - SSI and low dose lantus  B12 deficiency - Macrocytosis -replacing w/ 7 day SQ load of B12 - outpatient follow up     Medical Consultants:    ortho.   Discharge Exam:   Filed Vitals:  04/30/16 0512 04/30/16 0950  BP: 123/49 111/58  Pulse: 67 90  Temp: 98.2 F (36.8 C) 97.9 F (36.6 C)  Resp: 20 20   Filed Vitals:   04/29/16 1704 04/29/16 2100 04/30/16 0512 04/30/16 0950  BP:  151/76 123/49 111/58  Pulse:  75 67 90  Temp:  98.2 F (36.8 C) 98.2 F (36.8 C) 97.9 F (36.6 C)  TempSrc:  Oral Oral Oral  Resp:  Height:      Weight:   137 kg (302  lb 0.5 oz)   SpO2: 99% 99% 98%     Gen:  NAD    The results of significant diagnostics from this hospitalization (including imaging, microbiology, ancillary and laboratory) are listed below for reference.     Procedures and Diagnostic Studies:   US Renal  04/21/2016  CLINICAL DATA:  Acute renal failure. EXAM: RENAL / URINARY TRACT ULTRASOUND COMPLETE COMPARISON:  CT 04/17/2016. FINDINGS: Right Kidney: Length: 13.2 cm. Echogenicity within normal limits. No mass, previously identified cyst not identified. Moderate right hydronephrosis visualized. Left Kidney: Length: 12.8 cm. Echogenicity within normal limits. No mass or hydronephrosis visualized. Bladder: Appears normal for degree of bladder distention. Exam limited due to patient's body habitus, breathing, and bowel gas. IMPRESSION: Limited exam.  Moderate right hydronephrosis. Electronically Signed   By: Maisie Fus  Register   On: 04/21/2016 11:33   Dg Chest Port 1 View  04/20/2016  CLINICAL DATA:  Status post fall, with concern for chest injury. Initial encounter. EXAM: PORTABLE CHEST 1 VIEW COMPARISON:  None. FINDINGS: The lungs are well-aerated. Vascular congestion is noted. Bibasilar airspace opacities may reflect mild interstitial edema. There is no evidence of pleural effusion or pneumothorax. The cardiomediastinal silhouette is mildly enlarged. A pacemaker is noted overlying the left chest wall, with leads ending overlying the right atrium and right ventricle. No acute osseous abnormalities are seen. IMPRESSION: Vascular congestion and mild cardiomegaly. Bibasilar airspace opacities may reflect mild interstitial edema. No displaced rib fracture seen. Electronically Signed   By: Roanna Raider M.D.   On: 04/20/2016 02:28   Dg Foot Complete Right  04/20/2016  CLINICAL DATA:  Status post fall, with laceration at the proximal right foot. Initial encounter. EXAM: RIGHT FOOT COMPLETE - 3+ VIEW COMPARISON:  None. FINDINGS: There is displacement at the  fifth proximal interphalangeal joint, which raises concern for dislocation. Would correlate with the patient's symptoms. No definite acute fractures are seen. Diffuse soft tissue swelling is noted about the forefoot and midfoot. The subtalar joint is grossly unremarkable in appearance. The known soft tissue laceration is not well characterized on radiograph. IMPRESSION: Concern for dislocation at the fifth proximal interphalangeal joint. Would correlate with the patient's symptoms. No definite acute fracture seen. Electronically Signed   By: Roanna Raider M.D.   On: 04/20/2016 02:29     Labs:   Basic Metabolic Panel:  Recent Labs Lab 04/24/16 0813 04/25/16 0234 04/26/16 0458 04/27/16 0420 04/28/16 0518 04/30/16 0311  NA 144 147* 144 141 143 139  K 6.1* 4.0 3.5 3.6 3.6 3.3*  CL 104 105 105 105 105 99*  CO2 31 35* GLUCOSE 160* 177* 186* 227* 171* 159*  BUN 29* 23* 22* 21* 20 17  CREATININE 0.97 0.99 0.89 0.96 0.88 0.80  CALCIUM 8.2* 8.9 8.6* 8.3* 8.6* 8.6*  MG  --  2.1  --   --  2.0  --   PHOS 2.5 2.9  --   --   --   --  GFR Estimated Creatinine Clearance: 84.5 mL/min (by C-G formula based on Cr of 0.8). Liver Function Tests:  Recent Labs Lab 04/24/16 0813 04/25/16 0234 04/27/16 0420  AST  --  29 23  ALT  --  29 27  ALKPHOS  --  49 46  BILITOT  --  0.9 0.9  PROT  --  5.3* 5.4*  ALBUMIN 2.2* 2.3* 2.3*   No results for input(s): LIPASE, AMYLASE in the last 168 hours. No results for input(s): AMMONIA in the last 168 hours. Coagulation profile No results for input(s): INR, PROTIME in the last 168 hours.  CBC:  Recent Labs Lab 04/25/16 0234 04/28/16 0518 04/30/16 0311  WBC 8.7 8.5 8.1  HGB 13.7 12.7* 12.3*  HCT 43.8 40.0 38.8*  MCV 104.3* 102.8* 101.6*  PLT 149* 137* 139*   Cardiac Enzymes: No results for input(s): CKTOTAL, CKMB, CKMBINDEX, TROPONINI in the last 168 hours. BNP: Invalid input(s): POCBNP CBG:  Recent Labs Lab 04/29/16 0607  04/29/16 1124 04/29/16 1627 04/29/16 2105 04/30/16 0604  GLUCAP 159* 206* 162* 145* 157*   D-Dimer No results for input(s): DDIMER in the last 72 hours. Hgb A1c No results for input(s): HGBA1C in the last 72 hours. Lipid Profile No results for input(s): CHOL, HDL, LDLCALC, TRIG, CHOLHDL, LDLDIRECT in the last 72 hours. Thyroid function studies No results for input(s): TSH, T4TOTAL, T3FREE, THYROIDAB in the last 72 hours.  Invalid input(s): FREET3 Anemia work up No results for input(s): VITAMINB12, FOLATE, FERRITIN, TIBC, IRON, RETICCTPCT in the last 72 hours. Microbiology No results found for this or any previous visit (from the past 240 hour(s)).   Discharge Instructions:       Discharge Instructions    Diet - low sodium heart healthy    Complete by:  As directed      Discharge instructions    Complete by:  As directed   2L O2 levaquin until 7/16 BMP 1 week DNR Bowel regimen for daily BMs If continues to be failure to thrive, may need palliative eval/hospice referral Outpatient GI follow up for pancreatic cyst     Increase activity slowly    Complete by:  As directed             Medication List    STOP taking these medications        amLODipine 5 MG tablet  Commonly known as:  NORVASC     atenolol 50 MG tablet  Commonly known as:  TENORMIN     carboxymethylcellulose 0.5 % Soln  Commonly known as:  REFRESH PLUS     ciprofloxacin 250 MG tablet  Commonly known as:  CIPRO     hydrochlorothiazide 25 MG tablet  Commonly known as:  HYDRODIURIL     ICAPS PO     insulin lispro 100 UNIT/ML injection  Commonly known as:  HUMALOG     losartan 50 MG tablet  Commonly known as:  COZAAR     Omega-3 1000 MG Caps     rosuvastatin 10 MG tablet  Commonly known as:  CRESTOR      TAKE these medications        acetaminophen 500 MG tablet  Commonly known as:  TYLENOL  Take 500 mg by mouth every 6 (six) hours as needed.     aspirin EC 81 MG tablet  Take 1  tablet by mouth daily.     docusate sodium 100 MG capsule  Commonly known as:  COLACE  Take 1 capsule (100 mg  total) by mouth 2 (two) times daily.     famotidine 20 MG tablet  Commonly known as:  PEPCID  Take 1 tablet (20 mg total) by mouth 2 (two) times daily.     hydrocortisone 2.5 % rectal cream  Commonly known as:  ANUSOL-HC  Apply topically 2 (two) times daily as needed for hemorrhoids or itching.     insulin aspart 100 UNIT/ML injection  Commonly known as:  novoLOG  Inject 0-9 Units into the skin 3 (three) times daily with meals.     insulin glargine 100 UNIT/ML injection  Commonly known as:  LANTUS  Inject 0.1 mLs (10 Units total) into the skin at bedtime.     lactulose 10 GM/15ML solution  Commonly known as:  CHRONULAC  Take 30 mLs (20 g total) by mouth 2 (two) times daily.     levofloxacin 750 MG tablet  Commonly known as:  LEVAQUIN  Take 1 tablet (750 mg total) by mouth daily.     polyethylene glycol packet  Commonly known as:  MIRALAX / GLYCOLAX  Take 17 g by mouth 2 (two) times daily.     senna-docusate 8.6-50 MG tablet  Commonly known as:  Senokot-S  Take 1 tablet by mouth 2 (two) times daily.       Follow-up Information    Follow up with Cheral Almas, MD In 1 week.   Specialty:  Orthopedic Surgery   Why:  For wound re-check   Contact information:   [address] [place] [address] [phone]        Time coordinating discharge: 35 min  Signed:  Loa Idler U Ankur Snowdon   [facility] 04/30/2016, 12:14 PM

## 2016-04-30 NOTE — Progress Notes (Signed)
Patient in stable condition, iv removed, tele dc ccmd notified, patient taken to [facility] by the EMS.

## 2016-04-30 NOTE — Progress Notes (Signed)
[facility]

## 2016-05-01 ENCOUNTER — Encounter (HOSPITAL_COMMUNITY)
Admission: RE | Admit: 2016-05-01 | Discharge: 2016-05-01 | Disposition: A | Discharge status: Home / Self Care | Payer: Medicare Other | Source: Skilled Nursing Facility | Attending: Internal Medicine | Admitting: Internal Medicine

## 2016-05-01 DIAGNOSIS — Z5189 Encounter for other specified aftercare: Secondary | ICD-10-CM | POA: Insufficient documentation

## 2016-05-01 DIAGNOSIS — I5032 Chronic diastolic (congestive) heart failure: Secondary | ICD-10-CM | POA: Insufficient documentation

## 2016-05-01 DIAGNOSIS — Z950 Presence of cardiac pacemaker: Secondary | ICD-10-CM | POA: Insufficient documentation

## 2016-05-01 DIAGNOSIS — Z9181 History of falling: Secondary | ICD-10-CM | POA: Insufficient documentation

## 2016-05-01 DIAGNOSIS — Z87442 Personal history of urinary calculi: Secondary | ICD-10-CM | POA: Insufficient documentation

## 2016-05-01 DIAGNOSIS — D519 Vitamin B12 deficiency anemia, unspecified: Secondary | ICD-10-CM | POA: Insufficient documentation

## 2016-05-01 LAB — CBC WITH DIFFERENTIAL/PLATELET
BASOS ABS: 0 10*3/uL (ref 0.0–0.1)
BASOS PCT: 0 %
EOS ABS: 0.2 10*3/uL (ref 0.0–0.7)
Eosinophils Relative: 2 %
HCT: 36 % — ABNORMAL LOW (ref 39.0–52.0)
HEMOGLOBIN: 11.8 g/dL — AB (ref 13.0–17.0)
Lymphocytes Relative: 10 %
Lymphs Abs: 1 10*3/uL (ref 0.7–4.0)
MCH: 33.3 pg (ref 26.0–34.0)
MCHC: 32.8 g/dL (ref 30.0–36.0)
MCV: 101.7 fL — ABNORMAL HIGH (ref 78.0–100.0)
MONOS PCT: 8 %
Monocytes Absolute: 0.8 10*3/uL (ref 0.1–1.0)
NEUTROS PCT: 80 %
Neutro Abs: 7.8 10*3/uL — ABNORMAL HIGH (ref 1.7–7.7)
Platelets: 133 10*3/uL — ABNORMAL LOW (ref 150–400)
RBC: 3.54 MIL/uL — ABNORMAL LOW (ref 4.22–5.81)
RDW: 15.4 % (ref 11.5–15.5)
WBC: 9.8 10*3/uL (ref 4.0–10.5)

## 2016-05-01 LAB — BASIC METABOLIC PANEL
Anion gap: 6 (ref 5–15)
BUN: 29 mg/dL — ABNORMAL HIGH (ref 6–20)
CALCIUM: 8.1 mg/dL — AB (ref 8.9–10.3)
CHLORIDE: 103 mmol/L (ref 101–111)
CO2: 31 mmol/L (ref 22–32)
CREATININE: 0.93 mg/dL (ref 0.61–1.24)
GFR calc non Af Amer: >60 mL/min (ref >60)
Glucose, Bld: 191 mg/dL — ABNORMAL HIGH (ref 65–99)
Potassium: 3.2 mmol/L — ABNORMAL LOW (ref 3.5–5.1)
SODIUM: 140 mmol/L (ref 135–145)

## 2016-05-04 ENCOUNTER — Non-Acute Institutional Stay (SKILLED_NURSING_FACILITY): Payer: Medicare Other | Admitting: Internal Medicine

## 2016-05-04 ENCOUNTER — Encounter (HOSPITAL_COMMUNITY)
Admission: RE | Admit: 2016-05-04 | Discharge: 2016-05-04 | Disposition: A | Discharge status: Home / Self Care | Payer: Medicare Other | Source: Skilled Nursing Facility | Attending: Internal Medicine | Admitting: Internal Medicine

## 2016-05-04 ENCOUNTER — Encounter: Payer: Self-pay | Admitting: Internal Medicine

## 2016-05-04 DIAGNOSIS — B370 Candidal stomatitis: Secondary | ICD-10-CM | POA: Diagnosis not present

## 2016-05-04 DIAGNOSIS — I5032 Chronic diastolic (congestive) heart failure: Secondary | ICD-10-CM

## 2016-05-04 DIAGNOSIS — K869 Disease of pancreas, unspecified: Secondary | ICD-10-CM

## 2016-05-04 DIAGNOSIS — S93104D Unspecified dislocation of right toe(s), subsequent encounter: Secondary | ICD-10-CM

## 2016-05-04 DIAGNOSIS — Z794 Long term (current) use of insulin: Secondary | ICD-10-CM | POA: Diagnosis not present

## 2016-05-04 DIAGNOSIS — S91311S Laceration without foreign body, right foot, sequela: Secondary | ICD-10-CM

## 2016-05-04 DIAGNOSIS — E118 Type 2 diabetes mellitus with unspecified complications: Secondary | ICD-10-CM | POA: Diagnosis not present

## 2016-05-04 DIAGNOSIS — K8689 Other specified diseases of pancreas: Secondary | ICD-10-CM

## 2016-05-04 DIAGNOSIS — R198 Other specified symptoms and signs involving the digestive system and abdomen: Secondary | ICD-10-CM

## 2016-05-04 DIAGNOSIS — R194 Change in bowel habit: Secondary | ICD-10-CM

## 2016-05-04 LAB — CBC WITH DIFFERENTIAL/PLATELET
BASOS ABS: 0 10*3/uL (ref 0.0–0.1)
BASOS PCT: 0 %
EOS ABS: 0.1 10*3/uL (ref 0.0–0.7)
EOS PCT: 2 %
HEMATOCRIT: 34.3 % — AB (ref 39.0–52.0)
HEMOGLOBIN: 11.4 g/dL — AB (ref 13.0–17.0)
Lymphocytes Relative: 15 %
Lymphs Abs: 1.3 10*3/uL (ref 0.7–4.0)
MCH: 33.3 pg (ref 26.0–34.0)
MCHC: 33.2 g/dL (ref 30.0–36.0)
MCV: 100.3 fL — AB (ref 78.0–100.0)
MONO ABS: 0.8 10*3/uL (ref 0.1–1.0)
MONOS PCT: 10 %
Neutro Abs: 6.3 10*3/uL (ref 1.7–7.7)
Neutrophils Relative %: 73 %
Platelets: 173 10*3/uL (ref 150–400)
RBC: 3.42 MIL/uL — ABNORMAL LOW (ref 4.22–5.81)
RDW: 15.8 % — ABNORMAL HIGH (ref 11.5–15.5)
WBC: 8.6 10*3/uL (ref 4.0–10.5)

## 2016-05-04 LAB — BASIC METABOLIC PANEL
ANION GAP: 6 (ref 5–15)
BUN: 23 mg/dL — AB (ref 6–20)
CHLORIDE: 104 mmol/L (ref 101–111)
CO2: 29 mmol/L (ref 22–32)
Calcium: 8 mg/dL — ABNORMAL LOW (ref 8.9–10.3)
Creatinine, Ser: 0.83 mg/dL (ref 0.61–1.24)
GFR calc Af Amer: >60 mL/min (ref >60)
GFR calc non Af Amer: >60 mL/min (ref >60)
GLUCOSE: 198 mg/dL — AB (ref 65–99)
POTASSIUM: 2.6 mmol/L — AB (ref 3.5–5.1)
Sodium: 139 mmol/L (ref 135–145)

## 2016-05-04 NOTE — Assessment & Plan Note (Signed)
Age and multiple advanced comorbidities mitigate against aggressive intervention for this stable lesion

## 2016-05-04 NOTE — Progress Notes (Signed)
Facility Location: [facility] Room Number: 102/P   PCP: Dwana MelenaZack Hall, MD [address] [place] [id number]     This is a comprehensive admission note to [facility] personally performed by Marga MelnickWilliam Tyaisha Cullom MD on this date less than 30 days from date of admission. Included are preadmission medical/surgical history;reconciled medication list; family history; social history and comprehensive review of systems.  Corrections and additions to the records were documented . Comprehensive physical exam was also performed. Additionally a clinical summary was entered for each active diagnosis pertinent to this admission in the Problem List to enhance continuity of care.   HPI: The patient was hospitalized 7/4-7/14/17 with a foot laceration. Significant comorbidities include failure to thrive. The patient had been discharged 04/19/16 with possible urinary tract infection; the final culture grew multiple species. He returned home where he lives by himself.Walking to the bathroom employing his walker his legs and hips "gave out" he says. He sustained a laceration of the right foot. He denies any cardiac or neurologic prodrome prior to the fall. Apparently he been in the floor for several hours until found by neighbors. In the emergency room 7/4 he was found to be hypotensive   Dr Roda ShuttersXu , [facility] performed formal incision and drainage and complex wound repair. He also required percutaneous pinning for dislocation of the right fifth toe. His course was complicated by probable ileus manifested by gastric distention and constipation. This resolved with an NG tube which was removed on 7/10. CT scan also revealed an exophytic cystic structure of the inferior body of the pancreas measuring 3.9 x 3.8 X4 centimeters. Outpatient follow-up by GI was recommended He also had acute kidney injury with urinary retention which resolved with placement of urinary Foley catheter and gentle  hydration. Although there was concern for possible SIRS there was no definite evidence of active sepsis. Bronchitis was suggested by cough, dyspnea, and green sputum. He also had mild hypoxia. He was placed on Levaquin for 5 days. Random glucoses ranged from 159-227. His A1c was 5.5% on 7/4.   Past medical and surgical history: Medical diagnoses include COPD. It is noted that he has not been noncompliant with recommendation for home O2 .He also has diagnoses of obstructive sleep apnea, diabetes and right hydronephrosis. Social history: Quit smoking in 1980.  Family history: Noncontributory  Comprehensive review of systems: He had been on 56 units of Lantus and 12 units of Humalog before meals at home. Fasting blood sugars ran 100-114. Low glucose value was 85. He follows no diet. He has intermittent numbness and shooting pain left in lower extremity. He describes occasional dysphagia. Constipation has been a chronic issue. At this time he has loose-watery stools.  Constitutional: No fever,significant weight change, fatigue  Eyes: No redness, discharge, pain, vision change ENT/mouth: No nasal congestion,  purulent discharge, earache,change in hearing ,sore throat  Cardiovascular: No chest pain, palpitations,paroxysmal nocturnal dyspnea, claudication Respiratory: No cough, sputum production,hemoptysis Gastrointestinal: No heartburn,dysphagia,abdominal pain, nausea / vomiting,rectal bleeding, melena Genitourinary: No dysuria,hematuria, pyuria,  incontinence, nocturia Musculoskeletal: No joint stiffness, joint swelling, weakness Dermatologic: No rash, pruritus, change in appearance of skin Neurologic: No dizziness,headache,syncope, seizures Psychiatric: No significant anxiety , depression, insomnia, anorexia Endocrine: No change in hair/skin/ nails, excessive thirst, excessive hunger, excessive urination  Hematologic/lymphatic: No lymphadenopathy,abnormal bleeding Allergy/immunology: No  itchy/ watery eyes, significant sneezing, urticaria, angioedema  Physical exam:  Pertinent or positive findings: He has extremely poor dentition with erosions and caries to and below  the gumline. Oral thrush is reported. Heart sounds are distant and clinically irregular. Breath sounds are also distant. Abdomen is distended and tense with decreased bowel sounds. He has extensive bruising over the forearms. He has extensive stasis dermatitis in a circumferential distribution of the legs. He has one half-1+ edema Pedal pulses are decreased. Right foot is wrapped. Clubbing of the nailbeds is present. General appearance:Adequately nourished; no acute distress , increased work of breathing is present on O2.  Lymphatic: No lymphadenopathy about the head, neck, axilla . Eyes: No conjunctival inflammation or lid edema is present. There is no scleral icterus. Ears:  External ear exam shows no significant lesions or deformities.   Nose:  External nasal examination shows no deformity or inflammation. Nasal mucosa are pink and moist without lesions ,exudates Oral exam: lips and gums are healthy appearing. Neck:  No thyromegaly, masses, tenderness noted.    Heart:  No gallop, murmur, click, rub .  Lungs:without wheezes, rhonchi,rales , rubs. Abdomen: no organomegaly, hernias,masses. GU: deferred as previously addressed. Extremities:  No cyanosis  Neurologic exam : Strength equal  in upper & lower extremities but decreased Balance,Rhomberg,finger to nose testing could not be completed due to clinical state Skin: Warm & dry w/o tenting. No significant lesions or rash.  See clinical summary under each active problem in the Problem List with associated updated therapeutic plan

## 2016-05-04 NOTE — Assessment & Plan Note (Signed)
No clinical evidence of cellulitis or purulence /abscess reported

## 2016-05-04 NOTE — Assessment & Plan Note (Signed)
Significant edema may reflect untreated obstructive sleep apnea Renal function is stable. Gentle diuresis will be pursued

## 2016-05-04 NOTE — Assessment & Plan Note (Signed)
An  A1c of 8 % or less  is the safest goal for him.Preferred is < 7 % as long as there are no low blood glucose events.  Goals for home glucose monitoring are : fasting  or morning glucose goal of  150-200. Ninety minutes after any meal , goal = < 180, preferably < 160. Please bring your diary of glucose readings; blood pressure readings; & all actual prescription medications & supplements or updated correct list to your appointment.

## 2016-05-04 NOTE — Patient Instructions (Addendum)
Florastor every day if the bowels are loose. This will replace the normal bacteria which  are necessary for formation of normal stool and processing of food. New orders for Matrix entry. KCL 20 mEq bid BMET 7/21 Lasix 20 mg daily Please hold the laxatives until the loose stools have resolved

## 2016-05-07 ENCOUNTER — Non-Acute Institutional Stay (SKILLED_NURSING_FACILITY): Payer: Medicare Other | Admitting: Internal Medicine

## 2016-05-07 ENCOUNTER — Encounter (HOSPITAL_COMMUNITY)
Admission: RE | Admit: 2016-05-07 | Discharge: 2016-05-07 | Disposition: A | Discharge status: Home / Self Care | Payer: Medicare Other | Source: Skilled Nursing Facility | Attending: Internal Medicine | Admitting: Internal Medicine

## 2016-05-07 DIAGNOSIS — I5032 Chronic diastolic (congestive) heart failure: Secondary | ICD-10-CM

## 2016-05-07 DIAGNOSIS — D508 Other iron deficiency anemias: Secondary | ICD-10-CM | POA: Diagnosis not present

## 2016-05-07 DIAGNOSIS — E876 Hypokalemia: Secondary | ICD-10-CM

## 2016-05-07 LAB — BASIC METABOLIC PANEL
Anion gap: 5 (ref 5–15)
BUN: 17 mg/dL (ref 6–20)
CHLORIDE: 104 mmol/L (ref 101–111)
CO2: 30 mmol/L (ref 22–32)
Calcium: 8 mg/dL — ABNORMAL LOW (ref 8.9–10.3)
Creatinine, Ser: 0.76 mg/dL (ref 0.61–1.24)
GFR calc non Af Amer: >60 mL/min (ref >60)
Glucose, Bld: 162 mg/dL — ABNORMAL HIGH (ref 65–99)
POTASSIUM: 3.2 mmol/L — AB (ref 3.5–5.1)
SODIUM: 139 mmol/L (ref 135–145)

## 2016-05-09 NOTE — Progress Notes (Signed)
This is an acute visit.  Level care skilled.  Facility [facility].   Chief complaint-acute visit follow-up hypokalemia-follow-up edema.  History of present illness.  Patient is a pleasant elderly resident who was hospitalized from July 4 to 04/30/2016 with a foot laceration.  He had been just discharged on July 3 with possible UTI.  Final culture grew multiple species.  Patient lives by himself returned home walking the bathroom using his walker his legs and hips gave out he sustained a laceration of the right foot.  He was on the floor for several hours apparently and was followed by neighbors.  He did receive an incision and drainage with complex wound repair.  Also required pinning for dislocation of the right fifth toe.  Also had a probable ileus with gastric distention and constipation.  He required an NG tube which was effective  CT also showed a cystic structure of the inferior body of the pancreas-outpatient follow-up by GI was recommended.  Also had acute kidney injury with urinary retention resolved with placement of a catheter and gentle hydration.  For suspected bronchitis he was placed on Levaquin for 5 days.  Clinically he appears to have done well he is working apparently well with physical therapy gaining some strength.  Labs did show a potassium that was down to 2.63 days ago-updated potassium is 3.2 this is being supplemented.  Dr. Alwyn Ren also saw him and was concerned  and he was started on Lasix 20 mg a day along with potassium-does have a history of diastolic CHF.  At this point appears to be stable in this regard is but will need updated labs which have been ordered for next week.  We also will monitor his weights to keep an eye on this most recently was 289.5 on July 17.  He had been to 92 on July 15.  Previous medical history significant for COPD also sleep apnea and diabetes and right hydronephrosis in addition to above stated  issues.  Social history quit smoking in 1980.  Family history is noncontributory.  Medications have been reviewed. And include aspirin 81 mg daily.  Pepcid 20 mg twice a day.  Iron 325 mg daily.  Lantus insulin 15 units daily at bedtime.  Lasix 20 mg daily.  Potassium 20 mEq twice a day.    Review of systems.  General does not complain of fever or chills.  Skin is not complaining of rashes or itching.  Head ears eyes nose mouth and throat does not complain of any visual changes or sore throat.  Respiratory denies any shortness of breath he is on oxygen.  Cardiac does not complaining of any chest pain or palpitations does have significant venous stasis changes lower extremity edema.  GI does not complain of nausea vomiting diarrhea had complain of constipation in the past but is not complaining of that this evening.  Musculoskeletal is not complaining currently of joint pain does continue with lower extremity weakness with significant edema venous stasis changes.  Neurologic is not complaining of dizziness or headache has at times complained of numbness left lower extremity.  Psych is not complaining of depression or anxiety  Physical exam.  Temperature is 97.3 pulse 83 respirations 20 blood pressure 127/57.  Most recently 2 89.5.  In general this is a pleasant elderly male in no distress he has significant obesity.  His skin is warm and dry does have significant venous stasis changes lower extremities with edema.  Oropharynx clear mucous membranes moist he  has numerous extractions and erosions.  Chest is clear to auscultation with shallow air entry there is no labored breathing.  Heart is regular rate and rhythm without murmur gallop or rub he has 1+ lower extremity edema with venous stasis changes.  Abdomen is soft nontender with positive bowel sounds it is obese.  Muscle skeletal is able to move all extremities 4 but has significant weakness most possible  lower extremities there is wrapping of his right foot.  Neurologic is grossly intact to speech is clear.  Psych he is grossly alert and oriented pleasant and appropriate  Labs.  05/07/2016.  Sodium 139 potassium 3.2 BUN 17 creatinine 0.76.  05/04/2016.  WBC 8.6 hemoglobin 11.4 platelets 173  Assessment and plan   #1 history of diastolic CHF-he has been started on Lasix 20 mg a day is on aggressive potassium supplementation as noted below-will order weights Monday Wednesday Friday notify provider of gain greater than 3 pounds clinically appears to be stable  #2-hypokalemia-this has improved with potassium of 3.2 on lab today this is up from 2.6  3 days ago he continues on potassium 20:'Milliequivalents twice a day and updated lab has been ordered for early next week  #3 anemia hemoglobin 11.4 has shown stability will monitor periodic intervals He continues on iron [id number]

## 2016-05-13 DIAGNOSIS — S92124D Nondisplaced fracture of body of right talus, subsequent encounter for fracture with routine healing: Secondary | ICD-10-CM | POA: Diagnosis not present

## 2016-05-13 DIAGNOSIS — S93134D Subluxation of interphalangeal joint of right lesser toe(s), subsequent encounter: Secondary | ICD-10-CM | POA: Diagnosis not present

## 2016-05-27 ENCOUNTER — Non-Acute Institutional Stay (SKILLED_NURSING_FACILITY): Payer: Medicare Other | Admitting: Internal Medicine

## 2016-05-27 ENCOUNTER — Other Ambulatory Visit (HOSPITAL_COMMUNITY)
Admission: RE | Admit: 2016-05-27 | Discharge: 2016-05-27 | Disposition: A | Discharge status: Home / Self Care | Payer: Medicare Other | Source: Other Acute Inpatient Hospital | Attending: Internal Medicine | Admitting: Internal Medicine

## 2016-05-27 ENCOUNTER — Encounter: Payer: Self-pay | Admitting: Internal Medicine

## 2016-05-27 DIAGNOSIS — E118 Type 2 diabetes mellitus with unspecified complications: Secondary | ICD-10-CM

## 2016-05-27 DIAGNOSIS — J42 Unspecified chronic bronchitis: Secondary | ICD-10-CM | POA: Diagnosis not present

## 2016-05-27 DIAGNOSIS — I5032 Chronic diastolic (congestive) heart failure: Secondary | ICD-10-CM | POA: Diagnosis not present

## 2016-05-27 DIAGNOSIS — S91311D Laceration without foreign body, right foot, subsequent encounter: Secondary | ICD-10-CM

## 2016-05-27 DIAGNOSIS — I10 Essential (primary) hypertension: Secondary | ICD-10-CM | POA: Insufficient documentation

## 2016-05-27 DIAGNOSIS — D500 Iron deficiency anemia secondary to blood loss (chronic): Secondary | ICD-10-CM | POA: Insufficient documentation

## 2016-05-27 LAB — BASIC METABOLIC PANEL
Anion gap: 5 (ref 5–15)
BUN: 10 mg/dL (ref 6–20)
CHLORIDE: 99 mmol/L — AB (ref 101–111)
CO2: 30 mmol/L (ref 22–32)
Calcium: 8.2 mg/dL — ABNORMAL LOW (ref 8.9–10.3)
Creatinine, Ser: 0.74 mg/dL (ref 0.61–1.24)
GFR calc Af Amer: >60 mL/min (ref >60)
GFR calc non Af Amer: >60 mL/min (ref >60)
Glucose, Bld: 177 mg/dL — ABNORMAL HIGH (ref 65–99)
POTASSIUM: 4 mmol/L (ref 3.5–5.1)
SODIUM: 134 mmol/L — AB (ref 135–145)

## 2016-05-27 LAB — CBC WITH DIFFERENTIAL/PLATELET
Basophils Absolute: 0 10*3/uL (ref 0.0–0.1)
Basophils Relative: 0 %
EOS PCT: 3 %
Eosinophils Absolute: 0.2 10*3/uL (ref 0.0–0.7)
HCT: 35.7 % — ABNORMAL LOW (ref 39.0–52.0)
HEMOGLOBIN: 11.7 g/dL — AB (ref 13.0–17.0)
LYMPHS ABS: 1.4 10*3/uL (ref 0.7–4.0)
LYMPHS PCT: 23 %
MCH: 33.1 pg (ref 26.0–34.0)
MCHC: 32.8 g/dL (ref 30.0–36.0)
MCV: 100.8 fL — AB (ref 78.0–100.0)
MONOS PCT: 12 %
Monocytes Absolute: 0.7 10*3/uL (ref 0.1–1.0)
NEUTROS PCT: 62 %
Neutro Abs: 3.7 10*3/uL (ref 1.7–7.7)
Platelets: 157 10*3/uL (ref 150–400)
RBC: 3.54 MIL/uL — AB (ref 4.22–5.81)
RDW: 16.4 % — ABNORMAL HIGH (ref 11.5–15.5)
WBC: 6 10*3/uL (ref 4.0–10.5)

## 2016-05-27 NOTE — Progress Notes (Signed)
Location:   [facility] Room Number: 102/P Place of Service:  SNF [phone]) Provider:  Phylis Bougie, MD  Patient Care Team: Benita Stabile, MD as PCP - General (Internal Medicine)  Extended Emergency Contact Information Primary Emergency Contact: Mena Pauls [place] of [place] Phone: [phone] Relation: Son Secondary Emergency Contact: Leonides Schanz [place] of [place] Mobile Phone: [phone] Relation: Son  Code Status:  DNR Goals of care: Advanced Directive information Advanced Directives 05/27/2016  Does patient have an advance directive? Yes  Type of Advance Directive Out of facility DNR (pink MOST or yellow form)  Does patient want to make changes to advanced directive? No - Patient declined  Copy of advanced directive(s) in chart? Yes  Would patient like information on creating an advanced directive? -     Chief Complaint  Patient presents with  . Acute Visit  Secondary to tachycardia.  Medical management of chronic medical conditions including right foot laceration-bronchitis with history COPD-anemia-diabetes type 2-diastolic CHF  HPI:  Pt is a 82 y.o. male seen today for an acute visit for episode of tachycardia during therapy.  Also medical management of above listed conditions  Patient apparently had elevated pulse during therapy today he did not complain of any chest pain or shortness of breath or syncope.  He does have a history of a pacemaker with sick sinus syndrome-we did do an EKG which did not show any acute changes from baseline.  Currently he is resting in bed comfortably pulses within normal range at 90 blood pressure is stable he has no complaints.   Regards to his other issues these have been stable he did sustain a foot laceration currently still has a pin in his right fifth toe and this will apparently be removed next week when he sees orthopedics He has an extensive history here with initially  sustaining the injury in a fall with a significant laceration-he did require percutaneous pinning for dislocation of the right fifth toe-hospital course was complicated by an ileus which resolved with NG tube.  A CT scan incidentally showed a cystic structure on the inferior body of the pancreas-outpatient follow-up was recommended with GI.  He also had acute kidney injury with urinary retention which resolved with placement of a catheter.  He was also treated for suspected bronchitis with a short course of Levaquin.  Marland Kitchen  He also has a history of diastolic CHF Lasix with potassium has been added his weight and edema appears to have improved.  In regards to diabetes he is on Lantus routinely and this appears to be stable with blood sugars largely in the 100s range.  He also has a history of anemia is on iron hemoglobin of 11.4 on 05/04/2016 shows relative stability.        Past Medical History:  Diagnosis Date  . Cardiac pacemaker in situ    for sick sinus syndrome-last placement was 09/2007 [facility]  . Diabetes [facility])   . GERD (gastroesophageal reflux disease)   . Mixed hyperlipidemia   . Obstructive lung disease (HCC)    non compliant with home O2  . Pulmonary hypertension (HCC)   . Sleep apnea   . Thrombocytopenia (HCC)   . Venous insufficiency (chronic) (peripheral)    Past Surgical History:  Procedure Laterality Date  . CATARACT EXTRACTION, BILATERAL Bilateral   . KIDNEY STONE SURGERY Left    laser ablation   . PERCUTANEOUS PINNING Right 04/20/2016   Procedure: IRRIGATION  AND DEBRIDEMENT RIGHT FOOT, PERCUTANEOUS PINNING SMALL TOE;  Surgeon: Tarry Kos, MD;  Location: [facility] OR;  Service: Orthopedics;  Laterality: Right;  . REFRACTIVE SURGERY Right   . ROTATOR CUFF REPAIR Left     Allergies  Allergen Reactions  . Advair Diskus [Fluticasone-Salmeterol]     Told by MD not to use  . Combivent [Ipratropium-Albuterol]     Told by MD not to use  . Lotensin Hct  [Benazepril-Hydrochlorothiazide]     Told by MD not to use  . Penicillins Swelling  . Simvastatin Other (See Comments)    myalgias    Current Outpatient Prescriptions on File Prior to Visit  Medication Sig Dispense Refill  . acetaminophen (TYLENOL) 500 MG tablet Take 500 mg by mouth every 6 (six) hours as needed.    Marland Kitchen aspirin EC 81 MG tablet Take 1 tablet by mouth daily.    . famotidine (PEPCID) 20 MG tablet Take 1 tablet (20 mg total) by mouth 2 (two) times daily.    . ferrous sulfate 325 (65 FE) MG tablet Take 325 mg by mouth daily with breakfast.    . hydrocortisone (ANUSOL-HC) 2.5 % rectal cream Apply topically 2 (two) times daily as needed for hemorrhoids or itching. 30 g 0  . Insulin Glargine (LANTUS SOLOSTAR) 100 UNIT/ML Solostar Pen Inject 15 Units into the skin daily. @@ 8:00 pm     . OXYGEN Inhale 2 L into the lungs.    . potassium chloride SA (K-DUR,KLOR-CON) 20 MEQ tablet Take 20 mEq by mouth 2 (two) times daily.     No current facility-administered medications on file prior to visit.   Lasix 20 mg a day has also been added secondary to increased edema  Review of Systems General does not complain of fever or chills. Weight and edema appeared to be decreasing with the Lasix  Skin is not complaining of rashes or itching.  Head ears eyes nose mouth and throat does not complain of any visual changes or sore throat.  Respiratory denies any shortness of breath he is on oxygen.  Cardiac does not complaining of any chest pain or palpitations does have significant venous stasis changes lower extremity edema.  GI does not complain of nausea vomiting diarrhea had complain of constipation in the past but is not complaining of that this evening.  Musculoskeletal is not complaining currently of joint pain does continue with lower extremity weakness with significant edema venous stasis changes.  Neurologic is not complaining of dizziness or headache has at times complained of  numbness left lower extremity. Is not complaining of that today  Psych is not complaining of depression or anxiety  Immunization History  Administered Date(s) Administered  . Tdap 04/20/2016   Pertinent  Health Maintenance Due  Topic Date Due  . FOOT EXAM  11/01/1931  . OPHTHALMOLOGY EXAM  11/01/1931  . URINE MICROALBUMIN  11/01/1931  . PNA vac Low Risk Adult (1 of 2 - PCV13) 11/01/1988  . INFLUENZA VACCINE  05/18/2016  . HEMOGLOBIN A1C  10/21/2016   No flowsheet data found. Functional Status Survey:    Vitals:   05/27/16 1535  BP: (!) 114/54  Pulse: 90  Resp: 19  Temp: 98 F (36.7 C)  TempSrc: Oral  SpO2: 98%   There is no height or weight on file to calculate BMI. Physical Exam    In general this is a pleasant elderly male in no distress he has significant obesity.-She is resting and bed comfortably  His  skin is warm and dry does have significant venous stasis changes lower extremities with edema although this appears to be improved somewhat from previous exams.  Oropharynx clear mucous membranes moist he has numerous extractions and erosions.  Chest is clear to auscultation with shallow air entry there is no labored breathing.  Heart is regular rate and rhythm with an occasional irregular beat without murmur gallop or rub he has 1+ lower extremity edema with venous stasis changes. This appears somewhat improved from previous exams  Abdomen is soft nontender with positive bowel sounds it is obese.  Muscle skeletal is able to move all extremities 4 but has significant weakness most possible lower extremities  He does have a pin present in his right fifth toe  Neurologic is grossly intact to speech is clear. No lateralizing findings  Psych he is grossly alert and oriented pleasant and appropriate  I  .    Labs reviewed:  05/27/2016.  WBC 6.0 hemoglobin 11.7 platelets 157.  Sodium 134 potassium 4 BUN 10 creatinine 0.74  Recent Labs   04/21/16 0600  04/23/16 0816 04/24/16 0813 04/25/16 0234  04/28/16 0518  05/01/16 0530 05/04/16 0600 05/07/16 0705  NA 133*  < > 139 144 147*  < > 143  < > 140 139 139  K 5.5*  < > 5.1 6.1* 4.0  < > 3.6  < > 3.2* 2.6* 3.2*  CL 98*  < > 107 104 105  < > 105  < > 103 104 104  CO2 25  < > 28 31 35*  < > 31  < > 31 29 30   GLUCOSE 132*  < > 155* 160* 177*  < > 171*  < > 191* 198* 162*  BUN 72*  < > 37* 29* 23*  < > 20  < > 29* 23* 17  CREATININE 5.21*  < > 1.06 0.97 0.99  < > 0.88  < > 0.93 0.83 0.76  CALCIUM 7.8*  < > 7.7* 8.2* 8.9  < > 8.6*  < > 8.1* 8.0* 8.0*  MG 2.3  --   --   --  2.1  --  2.0  --   --   --   --   PHOS  --   < > 2.7 2.5 2.9  --   --   --   --   --   --   < > = values in this interval not displayed.  Recent Labs  04/21/16 0600  04/24/16 0813 04/25/16 0234 04/27/16 0420  AST 28  --   --  29 23  ALT 21  --   --  29 27  ALKPHOS 40  --   --  49 46  BILITOT 0.5  --   --  0.9 0.9  PROT 5.0*  --   --  5.3* 5.4*  ALBUMIN 2.3*  < > 2.2* 2.3* 2.3*  < > = values in this interval not displayed.  Recent Labs  04/21/16 0600  04/30/16 0311 05/01/16 0530 05/04/16 0600  WBC 9.8  < > 8.1 9.8 8.6  NEUTROABS 7.0  --   --  7.8* 6.3  HGB 11.9*  < > 12.3* 11.8* 11.4*  HCT 37.6*  < > 38.8* 36.0* 34.3*  MCV 102.5*  < > 101.6* 101.7* 100.3*  PLT 125*  < > 139* 133* 173  < > = values in this interval not displayed. No results found for: TSH Lab Results  Component Value Date  HGBA1C 5.5 04/20/2016   No results found for: CHOL, HDL, LDLCALC, LDLDIRECT, TRIG, CHOLHDL  Significant Diagnostic Results in last 30 days:  Dg Abd Portable 1v  Result Date: 04/28/2016 CLINICAL DATA:  Gastric distension, gastric pain EXAM: PORTABLE ABDOMEN - 1 VIEW COMPARISON:  None. FINDINGS: The bowel gas pattern is normal. No radio-opaque calculi or other significant radiographic abnormality are seen. IMPRESSION: Negative. Electronically Signed   By: Elige Ko   On: 04/28/2016 21:31    Assessment and plan.  #1-tachycardia during therapy this appears to be transitory I suspect was more exercise related physical exam is baseline he is not tachycardic on exam currently-EKG did not really show any acute changes he does not appear to be symptomatic of any cardiac event no chest pain no shortness of breath no numbness at this point will monitor.  We have obtained a stat lab work which was unremarkable   #2-diabetes type 2 this appears relatively well controlled as noted above he is on Lantus.  #3 history of foot laceration with subsequent pinning-this is followed by orthopedics he will see them in several days and apparently have the pin removed he appears to have done relatively well with this area  #4 history of diastolic CHF this appears stable he has lost it appears about tender 14 pounds over the past several weeks he is on low-dose Lasix with potassium supplementation this appears to be relatively stable.  #5 history of COPD with bronchitis he was treated for this during his hospitalization there is been no further exasperation at this point will monitor.  #6 history of anemia he is on iron hemoglobin appears to be stable at  11.7 on lab done today  #7-pancreatic mass-with patient's advanced age and comorbidities would be a poor candidate for any aggressive workup this was assessed by Dr. Alwyn Ren previously  [phone] note greater than 35 minutes spent assessing patient-reviewing his labs-reviewing his chart-ordering an EKG and labs and reviewing them-and coordinating and formulating a plan of care for numerous diagnoses-of note greater than 50% of time spent coordinating plan of care.                    London Sheer, [place] [phone]

## 2016-06-01 ENCOUNTER — Encounter: Payer: Self-pay | Admitting: Cardiology

## 2016-06-05 ENCOUNTER — Non-Acute Institutional Stay (SKILLED_NURSING_FACILITY): Payer: Medicare Other | Admitting: Internal Medicine

## 2016-06-05 DIAGNOSIS — R339 Retention of urine, unspecified: Secondary | ICD-10-CM | POA: Diagnosis not present

## 2016-06-05 NOTE — Progress Notes (Signed)
This is an acute visit.  Level care skilled.  Facility [facility].  Chief complaint-acute visit follow-up urinary retention with indwelling Foley catheter.  History of present illness.  Patient is a pleasant 82 year old male here for rehabilitation after sustaining a foot laceration recently had a pin removed from his right fifth toe he appears to be getting stronger here with rehabilitation.  During his hospitalization he did have a ileus that resolved with an NG tube.  He also had acute kidney injury thought secondary to urinary retention-I have reviewed progress notes from the hospitalist and apparently one point had 1500 mL of urine which was relieved when the catheter was placed.  It appears his creatinine quickly normalized and most recently was 0.74 with BUN of 10 on lab done on 05/27/2016.  Patient has had his catheter replaced a couple times since he's been here-and I've been requested to evaluate whether we could do a trial course without the catheter since he is now stronger and ambulating more.  Actually per review of hospitalist noted appears there was some suggestion of  this in the hospital once he was ambulating better   Discussing this with patient the day I did suggest possibly followed by urology however he does not really want this at this time he does not want to travel to [place].  I discussed removal with him as well as with nursing-and at this point decided we will attempt at early next week-he would prefer not to started over the weekend since he apparently is going to have visitors.  Otherwise he has no complaints he is eating and drinking fairly well has gained significant strength appears to be significantly stronger than when he first arrived  [facility] does have a history of diastolic CHF continues on Lasix with potassium edema appears to have improved weights have been relatively stable      Past Medical History:  Diagnosis Date  . Cardiac pacemaker  in situ    for sick sinus syndrome-last placement was 09/2007 [facility]  . Diabetes [facility])   . GERD (gastroesophageal reflux disease)   . Mixed hyperlipidemia   . Obstructive lung disease (HCC)    non compliant with home O2  . Pulmonary hypertension (HCC)   . Sleep apnea   . Thrombocytopenia (HCC)   . Venous insufficiency (chronic) (peripheral)    Past Surgical History:  Procedure Laterality Date  . CATARACT EXTRACTION, BILATERAL Bilateral   . KIDNEY STONE SURGERY Left    laser ablation   . PERCUTANEOUS PINNING Right 04/20/2016   Procedure: IRRIGATION AND DEBRIDEMENT RIGHT FOOT, PERCUTANEOUS PINNING SMALL TOE;  Surgeon: Tarry Kos, MD;  Location: [facility] OR;  Service: Orthopedics;  Laterality: Right;  . REFRACTIVE SURGERY Right   . ROTATOR CUFF REPAIR Left          Allergies  Allergen Reactions  . Advair Diskus [Fluticasone-Salmeterol]     Told by MD not to use  . Combivent [Ipratropium-Albuterol]     Told by MD not to use    Lotensin Hct (Benazepril-HCTZ) told by MD not to use it--     Told by MD not to use  . Penicillins Swelling  . Simvastatin Other (See Comments)    myalgias          Current Outpatient Prescriptions on File Prior to Visit  Medication Sig Dispense Refill  . acetaminophen (TYLENOL) 500 MG tablet Take 500 mg by mouth every 6 (six) hours as needed.    Marland Kitchen aspirin  EC 81 MG tablet Take 1 tablet by mouth daily.    . famotidine (PEPCID) 20 MG tablet Take 1 tablet (20 mg total) by mouth 2 (two) times daily.    . ferrous sulfate 325 (65 FE) MG tablet Take 325 mg by mouth daily with breakfast.    . hydrocortisone (ANUSOL-HC) 2.5 % rectal cream Apply topically 2 (two) times daily as needed for hemorrhoids or itching. 30 g 0  . Insulin Glargine (LANTUS SOLOSTAR) 100 UNIT/ML Solostar Pen Inject 15 Units into the skin daily. @@ 8:00 pm     . OXYGEN Inhale 2 L into the lungs.    . potassium chloride SA (K-DUR,KLOR-CON)  20 MEQ tablet Take 20 mEq by mouth 2 (two) times daily.     No current facility-administered medications on file prior to visit.   Lasix 20 mg a day has also been added secondary to increased edema  Review of Systems General does not complain of fever or chills. Weight and edema appeared to be decreasing with the Lasix  Skin is not complaining of rashes or itching.  Head ears eyes nose mouth and throat does not complain of any visual changes or sore throat.  Respiratory denies any shortness of breath he is on oxygen.  Cardiac does not complaining of any chest pain or palpitations does have significant venous stasis changes lower extremity edema.  GI does not complain of nausea vomiting diarrhea had complain of constipation in the past but is not complaining of that Currently  GU does not complain of dysuria does have some history of urinary retention in the hospital denies any previous history of urinary retention  Musculoskeletal is not complaining currently of joint pain does continue with lower extremity weakness with significant edema venous stasis changes.  Neurologic is not complaining of dizziness or headache has at times complained of numbness left lower extremity. Is not complaining of that today  Psych is not complaining of depression or anxiety      Immunization History  Administered Date(s) Administered  . Tdap 04/20/2016       Pertinent  Health Maintenance Due  Topic Date Due  . FOOT EXAM  11/01/1931  . OPHTHALMOLOGY EXAM  11/01/1931  . URINE MICROALBUMIN  11/01/1931  . PNA vac Low Risk Adult (1 of 2 - PCV13) 11/01/1988  . INFLUENZA VACCINE  05/18/2016  . HEMOGLOBIN A1C  10/21/2016   No flowsheet data found. Functional Status Survey:       Physical Exam   He is afebrile pulse of 80 respirations 20 pressure 107/67-146/71 in this range I do not see consistent elevations   In general this is a pleasant elderly male in no distress he  has significant obesity.-He is in his wheelchair  His skin is warm and dry does have significant venous stasis changes lower extremities with edema although this continues to improve from previous exams.  Oropharynx clear mucous membranes moist he has numerous extractions and erosions.  Chest is clear to auscultation with shallow air entry there is no labored breathing.  Heart is regular rate and rhythm although heart sounds are somewhat distant-  without murmur gallop or rub he has 1+ lower extremity edema with venous stasis changes. This appears somewhat improved from previous exams  Abdomen is soft nontender with positive bowel sounds it is obese.  GU has an indwelling Foley catheter draining amber colored urine urinary output appears adequate  Muscle skeletal is able to move all extremities 4 but has significant weakness most  possible lower extremities  He does have a pin present in his right fifth toe  Neurologic is grossly intact to speech is clear. No lateralizing findings  Psych he is grossly alert and oriented pleasant and appropriate  I  .    Labs reviewed:  05/27/2016.  WBC 6.0 hemoglobin 11.7 platelets 157.  Sodium 134 potassium 4 BUN 10 creatinine 0.74  Recent Labs (within last 365 days)   Recent Labs  04/21/16 0600  04/23/16 0816 04/24/16 0813 04/25/16 0234  04/28/16 0518  05/01/16 0530 05/04/16 0600 05/07/16 0705  NA 133*  < > 139 144 147*  < > 143  < > 140 139 139  K 5.5*  < > 5.1 6.1* 4.0  < > 3.6  < > 3.2* 2.6* 3.2*  CL 98*  < > 107 104 105  < > 105  < > 103 104 104  CO2 25  < > 28 31 35*  < > 31  < > 31 29 30   GLUCOSE 132*  < > 155* 160* 177*  < > 171*  < > 191* 198* 162*  BUN 72*  < > 37* 29* 23*  < > 20  < > 29* 23* 17  CREATININE 5.21*  < > 1.06 0.97 0.99  < > 0.88  < > 0.93 0.83 0.76  CALCIUM 7.8*  < > 7.7* 8.2* 8.9  < > 8.6*  < > 8.1* 8.0* 8.0*  MG 2.3  --   --   --  2.1  --  2.0  --   --   --   --   PHOS  --   < >  2.7 2.5 2.9  --   --   --   --   --   --   < > = values in this interval not displayed.    Recent Labs (within last 365 days)   Recent Labs  04/21/16 0600  04/24/16 0813 04/25/16 0234 04/27/16 0420  AST 28  --   --  29 23  ALT 21  --   --  29 27  ALKPHOS 40  --   --  49 46  BILITOT 0.5  --   --  0.9 0.9  PROT 5.0*  --   --  5.3* 5.4*  ALBUMIN 2.3*  < > 2.2* 2.3* 2.3*  < > = values in this interval not displayed.    Recent Labs (within last 365 days)   Recent Labs  04/21/16 0600  04/30/16 0311 05/01/16 0530 05/04/16 0600  WBC 9.8  < > 8.1 9.8 8.6  NEUTROABS 7.0  --   --  7.8* 6.3  HGB 11.9*  < > 12.3* 11.8* 11.4*  HCT 37.6*  < > 38.8* 36.0* 34.3*  MCV 102.5*  < > 101.6* 101.7* 100.3*  PLT 125*  < > 139* 133* 173  < > = values in this interval not displayed.   Recent Labs  No results found for: TSH   Recent Labs       Lab Results  Component Value Date   HGBA1C 5.5 04/20/2016     Recent Labs  No results found for: CHOL, HDL, LDLCALC, LDLDIRECT, TRIG, CHOLHDL    Significant Diagnostic Results in last 30 days:   Imaging Results  Dg Abd Portable 1v  Result Date: 04/28/2016 CLINICAL DATA:  Gastric distension, gastric pain EXAM: PORTABLE ABDOMEN - 1 VIEW COMPARISON:  None. FINDINGS: The bowel gas pattern is normal. No radio-opaque calculi  or other significant radiographic abnormality are seen. IMPRESSION: Negative. Electronically Signed   By: Elige KoHetal  Patel   On: 04/28/2016 21:31    Assessment and plan.  #1 history of urinary retention currently with indwelling Foley catheter-discussion as noted above-it appears this is something that developed in the hospital he denies any previous history of this-apparently nursing staff has changed his Foley a couple times without complication in the facility-at this point he does not really want a urology consult.  Early next week we will do a voiding trial and remove the catheter-monitor urinary output  carefully-this was discussed with nursing who will be following this closely next week.  Clinically he appears to be improved stronger doing well his other medical conditions appear to be relatively stable.  [id number]-of note greater than 30 minutes spent assessing patient-reviewing his hospital records-discussing his status with nursing staff-as well as discussing patient's wishes at bedside.  Of note greater than 50% of time spent coordinating plan of care with input as noted above

## 2016-06-24 ENCOUNTER — Non-Acute Institutional Stay (SKILLED_NURSING_FACILITY): Payer: Medicare Other | Admitting: Internal Medicine

## 2016-06-24 DIAGNOSIS — S93104D Unspecified dislocation of right toe(s), subsequent encounter: Secondary | ICD-10-CM

## 2016-06-24 DIAGNOSIS — E118 Type 2 diabetes mellitus with unspecified complications: Secondary | ICD-10-CM | POA: Diagnosis not present

## 2016-06-24 DIAGNOSIS — K869 Disease of pancreas, unspecified: Secondary | ICD-10-CM | POA: Diagnosis not present

## 2016-06-24 DIAGNOSIS — K5909 Other constipation: Secondary | ICD-10-CM | POA: Diagnosis not present

## 2016-06-24 DIAGNOSIS — Z1389 Encounter for screening for other disorder: Secondary | ICD-10-CM | POA: Diagnosis not present

## 2016-06-24 DIAGNOSIS — I5032 Chronic diastolic (congestive) heart failure: Secondary | ICD-10-CM | POA: Diagnosis not present

## 2016-06-24 DIAGNOSIS — K8689 Other specified diseases of pancreas: Secondary | ICD-10-CM

## 2016-06-24 NOTE — Progress Notes (Signed)
This is a discharge note.  Level care skilled.  Facility [facility].  Chief complaint this is a discharge note  History of present illness.  Patient is a pleasant 82 year old here for rehabilitation after sustaining a total injury with significant laceration this was his right fifth toe.  He required percutaneous pinning he is followed by orthopedics.  The pain subsequently has been removed he appears to be doing well with this.  He also had an ileus in the hospital which resolved with an NG tube there's been no reoccurrence.    CT scan in the hospital did show an incidental cystic structure on the inferior body of the pancreas there was suggestion for outpatient follow-up-with patient's comorbidities suspect he would be a poor candidate however for any aggressive workup-will defer to his primary care provider however  He also had acute kidney injury with urinary retention this resolved with placement of a Foley-the family subsequently has been removed nonetheless she appears to be voiding well.  He also has a history of diastolic CHF he is on Lasix with potassium wasting edema appears to have stabilized actually edema appears improved.  He also is a type II diabetic on Lantus blood sugars appear to be stable recent blood sugars appear to be somewhat elevated more in the 200s this will warrant follow-up by his primary care provider would be hesitant   to change medications right before discharge    He also has a history of anemia continues on iron hemoglobin 11.7 on lab done 05/27/2016 show stability  I did see him approximately a month ago for apparently tachycardia during therapy-he does have a history of pacemaker we did do an EKG which was unremarkable or any changes-he does have a history of sick sinus syndrome--- suspect he had been exerting himself in therapy and his heart rate got somewhat fast very transitory he did not complain of any syncope or chest pain or shortness  of breath.  Patient is very motivated to go home and he does have a very supportive family he will need PT OT as well as home health support and a CNA to help with his activities of daily living secondary to continued weakness and obesity.   Past Medical History:  Diagnosis Date  . Cardiac pacemaker in situ    for sick sinus syndrome-last placement was 09/2007 [facility]  . Diabetes [facility])   . GERD (gastroesophageal reflux disease)   . Mixed hyperlipidemia   . Obstructive lung disease (HCC)    non compliant with home O2  . Pulmonary hypertension (HCC)   . Sleep apnea   . Thrombocytopenia (HCC)   . Venous insufficiency (chronic) (peripheral)         Past Surgical History:  Procedure Laterality Date  . CATARACT EXTRACTION, BILATERAL Bilateral   . KIDNEY STONE SURGERY Left    laser ablation   . PERCUTANEOUS PINNING Right 04/20/2016   Procedure: IRRIGATION AND DEBRIDEMENT RIGHT FOOT, PERCUTANEOUS PINNING SMALL TOE;  Surgeon: Tarry Kos, MD;  Location: [facility] OR;  Service: Orthopedics;  Laterality: Right;  . REFRACTIVE SURGERY Right   . ROTATOR CUFF REPAIR Left          Allergies  Allergen Reactions  . Advair Diskus [Fluticasone-Salmeterol]     Told by MD not to use  . Combivent [Ipratropium-Albuterol]     Told by MD not to use  . Lotensin Hct [Benazepril-Hydrochlorothiazide]     Told by MD not to use  . Penicillins Swelling  .  Simvastatin Other (See Comments)    myalgias          Current Outpatient Prescriptions on File Prior to Visit  Medication Sig Dispense Refill  . acetaminophen (TYLENOL) 500 MG tablet Take 500 mg by mouth every 6 (six) hours as needed.    Marland Kitchen. aspirin EC 81 MG tablet Take 1 tablet by mouth daily.    . famotidine (PEPCID) 20 MG tablet Take 1 tablet (20 mg total) by mouth 2 (two) times daily.    . ferrous sulfate 325 (65 FE) MG tablet Take 325 mg by mouth daily with breakfast.    . hydrocortisone (ANUSOL-HC)  2.5 % rectal cream Apply topically 2 (two) times daily as needed for hemorrhoids or itching. 30 g 0  . Insulin Glargine (LANTUS SOLOSTAR) 100 UNIT/ML Solostar Pen Inject 15 Units into the skin daily. @@ 8:00 pm     . OXYGEN Inhale 2 L into the lungs.    . potassium chloride SA (K-DUR,KLOR-CON) 20 MEQ tablet Take 20 mEq by mouth 2 (two) times daily.     No current facility-administered medications on file prior to visit.   Lasix 20 mg a day has also been added secondary to increased edema  Review of Systems General does not complain of fever or chills. edema appeared to be stable  Skin is not complaining of rashes or itching.  Head ears eyes nose mouth and throat does not complain of any visual changes or sore throat.  Respiratory denies any shortness of breath he is on oxygen.  Cardiac does not complaining of any chest pain or palpitations does have significant venous stasis changes lower extremity edema.  GI does not complain of nausea vomiting diarrhea had complain of constipation in the past but is not complaining of thatRecently  Musculoskeletal is not complaining currently of joint pain does continue with lower extremity weakness with significant edema venous stasis changes at this actually appears improved from his admission edema.  Neurologic is not complaining of dizziness or headache has at times complained of numbness left lower extremity. Is not complaining of that currently  Psych is not complaining of depression or anxiety      Immunization History  Administered Date(s) Administered  . Tdap 04/20/2016       Pertinent  Health Maintenance Due  Topic Date Due  . FOOT EXAM  11/01/1931  . OPHTHALMOLOGY EXAM  11/01/1931  . URINE MICROALBUMIN  11/01/1931  . PNA vac Low Risk Adult (1 of 2 - PCV13) 11/01/1988  . INFLUENZA VACCINE  05/18/2016  . HEMOGLOBIN A1C  10/21/2016   No flowsheet data found. Functional Status Survey:    Major 98.6 pulse 77  respirations 20 blood pressure 145/76 O2 saturation 98% on room air . Physical Exam    In general this is a pleasant elderly male in no distress he has significant obesity.  His skin is warm and dry does have significant venous stasis changes lower extremities with edema although this appears to be improving  Oropharynx clear mucous membranes moist he has numerous extractions and erosions.  Chest is clear to auscultation with shallow air entry there is no labored breathing.  Heart is regular rate and rhythm with an occasional irregular beat without murmur gallop or rub he has 1+ lower extremity edema with venous stasis changes.   Abdomen is soft nontender with positive bowel sounds it is obese.  Muscle skeletal is able to move all extremities 4 but has significant weakness most possible lower extremities  Neurologic is grossly intact to speech is clear. No lateralizing findings  Psych he is grossly alert and oriented pleasant and appropriate-looking forward to going home  I  .    Labs reviewed:  05/27/2016.  WBC 6.0 hemoglobin 11.7 platelets 157.  Sodium 134 potassium 4 BUN 10 creatinine 0.74  RecentLabs(withinlast365days)   Recent Labs  04/21/16 0600  04/23/16 0816 04/24/16 0813 04/25/16 0234  04/28/16 0518  05/01/16 0530 05/04/16 0600 05/07/16 0705  NA 133*  < > 139 144 147*  < > 143  < > 140 139 139  K 5.5*  < > 5.1 6.1* 4.0  < > 3.6  < > 3.2* 2.6* 3.2*  CL 98*  < > 107 104 105  < > 105  < > 103 104 104  CO2 25  < > 28 31 35*  < > 31  < > 31 29 30   GLUCOSE 132*  < > 155* 160* 177*  < > 171*  < > 191* 198* 162*  BUN 72*  < > 37* 29* 23*  < > 20  < > 29* 23* 17  CREATININE 5.21*  < > 1.06 0.97 0.99  < > 0.88  < > 0.93 0.83 0.76  CALCIUM 7.8*  < > 7.7* 8.2* 8.9  < > 8.6*  < > 8.1* 8.0* 8.0*  MG 2.3  --   --   --  2.1  --  2.0  --   --   --   --   PHOS  --   < > 2.7 2.5 2.9  --   --   --   --   --   --   < > = values in this  interval not displayed.    RecentLabs(withinlast365days)   Recent Labs  04/21/16 0600  04/24/16 0813 04/25/16 0234 04/27/16 0420  AST 28  --   --  29 23  ALT 21  --   --  29 27  ALKPHOS 40  --   --  49 46  BILITOT 0.5  --   --  0.9 0.9  PROT 5.0*  --   --  5.3* 5.4*  ALBUMIN 2.3*  < > 2.2* 2.3* 2.3*  < > = values in this interval not displayed.    RecentLabs(withinlast365days)   Recent Labs  04/21/16 0600  04/30/16 0311 05/01/16 0530 05/04/16 0600  WBC 9.8  < > 8.1 9.8 8.6  NEUTROABS 7.0  --   --  7.8* 6.3  HGB 11.9*  < > 12.3* 11.8* 11.4*  HCT 37.6*  < > 38.8* 36.0* 34.3*  MCV 102.5*  < > 101.6* 101.7* 100.3*  PLT 125*  < > 139* 133* 173  < > = values in this interval not displayed.   RecentLabs  No results found for: TSH   RecentLabs       Lab Results  Component Value Date   HGBA1C 5.5 04/20/2016     RecentLabs  No results found for: CHOL, HDL, LDLCALC, LDLDIRECT, TRIG, CHOLHDL    Significant Diagnostic Results in last 30 days:   ImagingResults  Dg Abd Portable 1v  Result Date: 04/28/2016 CLINICAL DATA:  Gastric distension, gastric pain EXAM: PORTABLE ABDOMEN - 1 VIEW COMPARISON:  None. FINDINGS: The bowel gas pattern is normal. No radio-opaque calculi or other significant radiographic abnormality are seen. IMPRESSION: Negative. Electronically Signed   By: Elige Ko   On: 04/28/2016 21:31  Assessment and plan   \History of right foot laceration again he did have pinning of his fifth 1his has been removed he has done well relatively with physical therapy follow-up with orthopedics as needed  History of diabetes type 2 blood sugars appear to be somewhat elevated at times in the 200s since he is about to go home will not be aggressive changing this-he is on Lantus 15 units daily at bedtime.  #3 history of CHF-diastolic- he is on Lasix with potassium supplementation his edema appears to continue to improve  after some initial weight gain-clinically appears stable without complaints of chest pain or shortness of breath follow-up by primary care provider as deemed necessary recent metabolic panel showed stability  #4 history COPD with bronchitis he experienced this in the hospital--not  really not been any recurrence during his stay here  #5 history of anemia this appears stable with a hemoglobin of 11.7-he is on iron.  #6 history of pancreatic mass- probablynot a good candidate for any aggressive workup secondary to advanced age and comorbidities-will defer to primary care provider for any possible follow-up .  Gain he will need continued PT and OT for further strengthening with his weakness and morbid obesity as well as home health support for multiple medical issues and CNA to help with activities of daily living  [id number]-of note greater than 30 minutes spent on this discharge summary-greater than 50% of time spent coordinating plan of care including reviewing chart reviewing labs and discussing patient's status with nursing staff and formulating a plan of follow-up care  p         removed

## 2016-06-28 DIAGNOSIS — Z87891 Personal history of nicotine dependence: Secondary | ICD-10-CM | POA: Diagnosis not present

## 2016-06-28 DIAGNOSIS — E669 Obesity, unspecified: Secondary | ICD-10-CM | POA: Diagnosis not present

## 2016-06-28 DIAGNOSIS — K862 Cyst of pancreas: Secondary | ICD-10-CM | POA: Diagnosis not present

## 2016-06-28 DIAGNOSIS — E785 Hyperlipidemia, unspecified: Secondary | ICD-10-CM | POA: Diagnosis not present

## 2016-06-28 DIAGNOSIS — I272 Other secondary pulmonary hypertension: Secondary | ICD-10-CM | POA: Diagnosis not present

## 2016-06-28 DIAGNOSIS — Z950 Presence of cardiac pacemaker: Secondary | ICD-10-CM | POA: Diagnosis not present

## 2016-06-28 DIAGNOSIS — S51011A Laceration without foreign body of right elbow, initial encounter: Secondary | ICD-10-CM | POA: Diagnosis not present

## 2016-06-28 DIAGNOSIS — E119 Type 2 diabetes mellitus without complications: Secondary | ICD-10-CM | POA: Diagnosis not present

## 2016-06-28 DIAGNOSIS — J449 Chronic obstructive pulmonary disease, unspecified: Secondary | ICD-10-CM | POA: Diagnosis not present

## 2016-06-28 DIAGNOSIS — G4733 Obstructive sleep apnea (adult) (pediatric): Secondary | ICD-10-CM | POA: Diagnosis not present

## 2016-06-28 DIAGNOSIS — K567 Ileus, unspecified: Secondary | ICD-10-CM | POA: Diagnosis not present

## 2016-06-28 DIAGNOSIS — R339 Retention of urine, unspecified: Secondary | ICD-10-CM | POA: Diagnosis not present

## 2016-06-30 DIAGNOSIS — I272 Other secondary pulmonary hypertension: Secondary | ICD-10-CM | POA: Diagnosis not present

## 2016-06-30 DIAGNOSIS — E119 Type 2 diabetes mellitus without complications: Secondary | ICD-10-CM | POA: Diagnosis not present

## 2016-06-30 DIAGNOSIS — K862 Cyst of pancreas: Secondary | ICD-10-CM | POA: Diagnosis not present

## 2016-06-30 DIAGNOSIS — G4733 Obstructive sleep apnea (adult) (pediatric): Secondary | ICD-10-CM | POA: Diagnosis not present

## 2016-06-30 DIAGNOSIS — J449 Chronic obstructive pulmonary disease, unspecified: Secondary | ICD-10-CM | POA: Diagnosis not present

## 2016-06-30 DIAGNOSIS — R339 Retention of urine, unspecified: Secondary | ICD-10-CM | POA: Diagnosis not present

## 2016-07-01 ENCOUNTER — Encounter (HOSPITAL_COMMUNITY): Payer: Self-pay | Admitting: Emergency Medicine

## 2016-07-01 ENCOUNTER — Emergency Department (HOSPITAL_COMMUNITY)
Admission: EM | Admit: 2016-07-01 | Discharge: 2016-07-01 | Disposition: A | Discharge status: Home / Self Care | Payer: Medicare Other | Attending: Emergency Medicine | Admitting: Emergency Medicine

## 2016-07-01 ENCOUNTER — Emergency Department (HOSPITAL_COMMUNITY): Payer: Medicare Other

## 2016-07-01 ENCOUNTER — Ambulatory Visit: Payer: BLUE CROSS/BLUE SHIELD | Admitting: Cardiology

## 2016-07-01 DIAGNOSIS — W19XXXA Unspecified fall, initial encounter: Secondary | ICD-10-CM | POA: Insufficient documentation

## 2016-07-01 DIAGNOSIS — Y939 Activity, unspecified: Secondary | ICD-10-CM | POA: Insufficient documentation

## 2016-07-01 DIAGNOSIS — M7989 Other specified soft tissue disorders: Secondary | ICD-10-CM | POA: Diagnosis not present

## 2016-07-01 DIAGNOSIS — Z87891 Personal history of nicotine dependence: Secondary | ICD-10-CM | POA: Insufficient documentation

## 2016-07-01 DIAGNOSIS — S51011A Laceration without foreign body of right elbow, initial encounter: Secondary | ICD-10-CM | POA: Diagnosis not present

## 2016-07-01 DIAGNOSIS — R0902 Hypoxemia: Secondary | ICD-10-CM | POA: Diagnosis not present

## 2016-07-01 DIAGNOSIS — S59901A Unspecified injury of right elbow, initial encounter: Secondary | ICD-10-CM | POA: Diagnosis not present

## 2016-07-01 DIAGNOSIS — Y929 Unspecified place or not applicable: Secondary | ICD-10-CM | POA: Diagnosis not present

## 2016-07-01 DIAGNOSIS — S91119A Laceration without foreign body of unspecified toe without damage to nail, initial encounter: Secondary | ICD-10-CM

## 2016-07-01 DIAGNOSIS — E119 Type 2 diabetes mellitus without complications: Secondary | ICD-10-CM | POA: Diagnosis not present

## 2016-07-01 DIAGNOSIS — S99921A Unspecified injury of right foot, initial encounter: Secondary | ICD-10-CM | POA: Diagnosis present

## 2016-07-01 DIAGNOSIS — Z7982 Long term (current) use of aspirin: Secondary | ICD-10-CM | POA: Diagnosis not present

## 2016-07-01 DIAGNOSIS — S91114A Laceration without foreign body of right lesser toe(s) without damage to nail, initial encounter: Secondary | ICD-10-CM | POA: Diagnosis not present

## 2016-07-01 DIAGNOSIS — S51809A Unspecified open wound of unspecified forearm, initial encounter: Secondary | ICD-10-CM | POA: Diagnosis not present

## 2016-07-01 DIAGNOSIS — Z794 Long term (current) use of insulin: Secondary | ICD-10-CM | POA: Insufficient documentation

## 2016-07-01 DIAGNOSIS — Y999 Unspecified external cause status: Secondary | ICD-10-CM | POA: Diagnosis not present

## 2016-07-01 MED ORDER — CEPHALEXIN 500 MG PO CAPS: 500.0000 mg | ORAL_CAPSULE | Freq: Three times a day (TID) | ORAL | 0 refills | Status: DC

## 2016-07-01 NOTE — ED Notes (Signed)
EDP at bedside updating patient and family. 

## 2016-07-01 NOTE — ED Notes (Signed)
EDP at bedside  

## 2016-07-01 NOTE — ED Provider Notes (Signed)
[facility]-EMERGENCY DEPT Provider Note   CSN: [id number] Arrival date & time: 07/01/16  1343  By signing my name below, I, Javier Docker, attest that this documentation has been prepared under the direction and in the presence of Azalia Bilis, MD. Electronically Signed: Javier Docker, ER Scribe. 05/29/2016. 2:04 PM.   History   Chief Complaint Chief Complaint  Patient presents with  . Fall   HPI  HPI Comments: Alan Briggs is a 82 y.o. male who presents to the Emergency Department complaining of a fall earlier today. He is complaining of arm and foot pain. He denies chest, foot, abdominal, hip, or elbow pain. Pt is unsure of what happened thinks he may have tripped while closing the refrigerator.    Past Medical History:  Diagnosis Date  . Cardiac pacemaker in situ    for sick sinus syndrome-last placement was 09/2007 [facility]  . Diabetes [facility])   . GERD (gastroesophageal reflux disease)   . Mixed hyperlipidemia   . Obstructive lung disease (HCC)    non compliant with home O2  . Pulmonary hypertension (HCC)   . Sleep apnea   . Thrombocytopenia (HCC)   . Venous insufficiency (chronic) (peripheral)     Patient Active Problem List   Diagnosis Date Noted  . Ileus (HCC) 04/30/2016  . Acute renal failure (HCC)   . Pancreatic mass   . Sepsis, unspecified organism (HCC)   . Chronic obstructive pulmonary disease (HCC)   . OSA (obstructive sleep apnea)   . Controlled diabetes mellitus type 2 with complications (HCC)   . Sick sinus syndrome (HCC)   . Presence of permanent cardiac pacemaker   . Foot laceration   . Dislocation of fifth toe, right, closed   . Abdominal pain 04/17/2016  . UTI (lower urinary tract infection) 04/17/2016  . Pacemaker 09/30/2015  . Chronic diastolic heart failure (HCC) 09/30/2015  . Venous insufficiency 09/30/2015    Past Surgical History:  Procedure Laterality Date  . CATARACT EXTRACTION, BILATERAL Bilateral   . KIDNEY STONE SURGERY  Left    laser ablation   . PERCUTANEOUS PINNING Right 04/20/2016   Procedure: IRRIGATION AND DEBRIDEMENT RIGHT FOOT, PERCUTANEOUS PINNING SMALL TOE;  Surgeon: Tarry Kos, MD;  Location: [facility] OR;  Service: Orthopedics;  Laterality: Right;  . REFRACTIVE SURGERY Right   . ROTATOR CUFF REPAIR Left     OB History    No data available       Home Medications    Prior to Admission medications   Medication Sig Start Date End Date Taking? Authorizing Provider  acetaminophen (TYLENOL) 500 MG tablet Take 500 mg by mouth every 6 (six) hours as needed.    Historical Provider, MD  aspirin EC 81 MG tablet Take 1 tablet by mouth daily.    Historical Provider, MD  famotidine (PEPCID) 20 MG tablet Take 1 tablet (20 mg total) by mouth 2 (two) times daily. 04/30/16   Joseph Art, DO  ferrous sulfate 325 (65 FE) MG tablet Take 325 mg by mouth daily with breakfast.    Historical Provider, MD  furosemide (LASIX) 20 MG tablet Take 20 mg by mouth.    Historical Provider, MD  hydrocortisone (ANUSOL-HC) 2.5 % rectal cream Apply topically 2 (two) times daily as needed for hemorrhoids or itching. 04/30/16   Joseph Art, DO  Insulin Glargine (LANTUS SOLOSTAR) 100 UNIT/ML Solostar Pen Inject 15 Units into the skin daily. @@ 8:00 pm     Historical Provider, MD  OXYGEN Inhale 2 L into the lungs.    Historical Provider, MD  Pollen Extracts (PROSTAT PO) Give 45 ml twice a day between meals    Historical Provider, MD  potassium chloride SA (K-DUR,KLOR-CON) 20 MEQ tablet Take 20 mEq by mouth 2 (two) times daily.    Historical Provider, MD  Probiotic Product (RISA-BID PROBIOTIC PO) 1 tablet by mouth once a day until loose stool resolves    Historical Provider, MD  zinc oxide 20 % ointment Apply every shift prn to buttocks and testicles    Historical Provider, MD    Family History Family History  Problem Relation Age of Onset  . Diabetes Mother     Social History Social History  Substance Use Topics  . Smoking  status: Former Smoker    Packs/day: 0.50    Years: 30.00    Types: Cigarettes    Start date: 10/18/1948    Quit date: 10/18/1978  . Smokeless tobacco: Never Used  . Alcohol use Not on file     Allergies   Advair diskus [fluticasone-salmeterol]; Combivent [ipratropium-albuterol]; Lotensin hct [benazepril-hydrochlorothiazide]; Penicillins; and Simvastatin   Review of Systems Review of Systems 10 Systems reviewed and all are negative for acute change except as noted in the HPI.  Physical Exam Updated Vital Signs BP (!) 99/49 (BP Location: Left Arm)   Pulse 89   Temp 98.2 F (36.8 C) (Oral)   Resp 20   Ht 6' (1.829 m)   Wt 270 lb (122.5 kg)   SpO2 97%   BMI 36.62 kg/m   Physical Exam  Constitutional: He is oriented to person, place, and time. He appears well-developed and well-nourished.  HENT:  Head: Normocephalic and atraumatic.  Eyes: EOM are normal.  Neck: Normal range of motion.  Cardiovascular: Normal rate, regular rhythm and normal heart sounds.   Pulmonary/Chest: Effort normal and breath sounds normal. No respiratory distress.  Abdominal: Soft. He exhibits no distension. There is no tenderness.  Musculoskeletal: Normal range of motion.  Skin tear of the right lateral elbow without bleeding at this time.  Normal range of motion of the right shoulder and right wrist.  Mild pain with range of motion of the right elbow without obvious deformity.  Normal right radial pulse.  Full range of motion of bilateral hips, knees, ankles.  Full range of motion of left shoulder, left elbow, left wrist.  No thoracic or lumbar tenderness.  Patient does have 3 lacerations at the plantar surface of the base of his third fourth and fifth toes.  There is no active bleeding at this time.  The laceration is associated on these 3 toes all of which have claw deformities  Neurological: He is alert and oriented to person, place, and time.  Skin: Skin is warm and dry.  Psychiatric: He has a normal  mood and affect. Judgment normal.  Nursing note and vitals reviewed.    ED Treatments / Results  DIAGNOSTIC STUDIES: Oxygen Saturation is 97% on RA, normal by my interpretation.    COORDINATION OF CARE: 2:04 PM Discussed treatment plan with pt at bedside which includes stiches and pt agreed to plan.  Labs (all labs ordered are listed, but only abnormal results are displayed) Labs Reviewed - No data to display  EKG  EKG Interpretation None       Radiology No results found.  Procedures Procedures (including critical care time)   ++++++++++++++++++++++++++++++++++++++  LACERATION REPAIR Performed by: Lyanne Co Consent: Verbal consent obtained. Risks and benefits: risks,  benefits and alternatives were discussed Patient identity confirmed: provided demographic data Time out performed prior to procedure Prepped and Draped in normal sterile fashion Wound explored Laceration Location: Right lateral elbow Laceration Length: 5 cm No Foreign Bodies seen or palpated Anesthesia: None  Irrigation method: Normal saline 4 x 4's  Amount of cleaning: standard Skin closure: Steri-Strips  Number of sutures or staples: Steri-Strips  Technique: Steri-Strips  Patient tolerance: Patient tolerated the procedure well with no immediate complications.   +++++++++++++++++++++++++++++++++++    Medications Ordered in ED Medications - No data to display   Initial Impression / Assessment and Plan / ED Course  I have reviewed the triage vital signs and the nursing notes.  Pertinent labs & imaging results that were available during my care of the patient were reviewed by me and considered in my medical decision making (see chart for details).  Clinical Course    Steri-Strips applied to the skin tear of the right elbow.  Infection warnings given.  In regards to the laceration at the base of the 3 toes I think that placement of the suture is going to be complicated and may  complicate his overall recovery and potentially increase his risk of infection.  I've asked that the family keep a close eye on his feet and do antibacterial warm water soaks twice a day and keep them clean and dry.  I prescribed the patient Keflex.  Patient artery has home health coming to the house.  I've added on additional home health services so as to better manage these wounds as they will likely take some time to heal.  This was explained at length to the patient the patient's family.  Final Clinical Impressions(s) / ED Diagnoses   Final diagnoses:  Fall, initial encounter  Skin tear of right elbow without complication, initial encounter  Laceration of toe of right foot, initial encounter    New Prescriptions New Prescriptions   No medications on file     I personally performed the services described in this documentation, which was scribed in my presence. The recorded information has been reviewed and is accurate.           Azalia BilisKevin Natividad Halls, MD 07/01/16 [phone]

## 2016-07-01 NOTE — ED Notes (Signed)
Family states they have to go home and get a different vehicle to transport patient in.

## 2016-07-01 NOTE — ED Notes (Signed)
When further assessing patient, pt noted to have blood on sock. Pt denies any pain to foot. When sock was removed, blood noted beneath toes. Pt noted to have deep lacerations to 4th and 5th toes. Pt states he had surgery a few weeks ago and a pin was placed. EDP aware. Bleeding controlled.

## 2016-07-01 NOTE — ED Notes (Addendum)
Family requesting that pt be admitted over night to the hospital. Explained to family that pt was already discharged and that in order to be admitted it would have to be approved by EDP and hospitalist. Family verbalized understanding, but were concerned that pt would be unable to ambulate up the five steps in his house. Pt ambulated with this RN. Gait was steady and even. One assist recommended due to injury on RT foot. Family then requesting if EMS could transport home. Informed family that there would have to be reasonable cause for EMS transport and since pt was ambulatory without difficulty, pt needed to go with family. This RN assisted with getting patient into vehicle. Pt able to step into van without difficulty. Wounds cleaned and dressed prior to discharge.

## 2016-07-01 NOTE — ED Notes (Signed)
Pt returned from xray

## 2016-07-01 NOTE — ED Notes (Signed)
Pt taken to xray with Tracey.

## 2016-07-01 NOTE — ED Triage Notes (Signed)
Pt c/o skin tear to RT arm after falling. Pt states he is unsure what caused his fall. Pt thinks he just lost his balance. Denies hitting head or LOC. Pt is not taking blood thinners. Bleeding controlled at this time.

## 2016-07-02 ENCOUNTER — Ambulatory Visit: Payer: BLUE CROSS/BLUE SHIELD | Admitting: Cardiology

## 2016-07-02 DIAGNOSIS — K862 Cyst of pancreas: Secondary | ICD-10-CM | POA: Diagnosis not present

## 2016-07-02 DIAGNOSIS — I272 Other secondary pulmonary hypertension: Secondary | ICD-10-CM | POA: Diagnosis not present

## 2016-07-02 DIAGNOSIS — G4733 Obstructive sleep apnea (adult) (pediatric): Secondary | ICD-10-CM | POA: Diagnosis not present

## 2016-07-02 DIAGNOSIS — E119 Type 2 diabetes mellitus without complications: Secondary | ICD-10-CM | POA: Diagnosis not present

## 2016-07-02 DIAGNOSIS — J449 Chronic obstructive pulmonary disease, unspecified: Secondary | ICD-10-CM | POA: Diagnosis not present

## 2016-07-02 DIAGNOSIS — R339 Retention of urine, unspecified: Secondary | ICD-10-CM | POA: Diagnosis not present

## 2016-07-05 DIAGNOSIS — G4733 Obstructive sleep apnea (adult) (pediatric): Secondary | ICD-10-CM | POA: Diagnosis not present

## 2016-07-05 DIAGNOSIS — E119 Type 2 diabetes mellitus without complications: Secondary | ICD-10-CM | POA: Diagnosis not present

## 2016-07-05 DIAGNOSIS — R339 Retention of urine, unspecified: Secondary | ICD-10-CM | POA: Diagnosis not present

## 2016-07-05 DIAGNOSIS — R945 Abnormal results of liver function studies: Secondary | ICD-10-CM | POA: Diagnosis not present

## 2016-07-05 DIAGNOSIS — J449 Chronic obstructive pulmonary disease, unspecified: Secondary | ICD-10-CM | POA: Diagnosis not present

## 2016-07-05 DIAGNOSIS — I272 Other secondary pulmonary hypertension: Secondary | ICD-10-CM | POA: Diagnosis not present

## 2016-07-05 DIAGNOSIS — K862 Cyst of pancreas: Secondary | ICD-10-CM | POA: Diagnosis not present

## 2016-07-06 DIAGNOSIS — E1141 Type 2 diabetes mellitus with diabetic mononeuropathy: Secondary | ICD-10-CM | POA: Diagnosis not present

## 2016-07-06 DIAGNOSIS — I272 Other secondary pulmonary hypertension: Secondary | ICD-10-CM | POA: Diagnosis not present

## 2016-07-06 DIAGNOSIS — E119 Type 2 diabetes mellitus without complications: Secondary | ICD-10-CM | POA: Diagnosis not present

## 2016-07-06 DIAGNOSIS — R945 Abnormal results of liver function studies: Secondary | ICD-10-CM | POA: Diagnosis not present

## 2016-07-06 DIAGNOSIS — R339 Retention of urine, unspecified: Secondary | ICD-10-CM | POA: Diagnosis not present

## 2016-07-06 DIAGNOSIS — G4733 Obstructive sleep apnea (adult) (pediatric): Secondary | ICD-10-CM | POA: Diagnosis not present

## 2016-07-06 DIAGNOSIS — K862 Cyst of pancreas: Secondary | ICD-10-CM | POA: Diagnosis not present

## 2016-07-06 DIAGNOSIS — Z4800 Encounter for change or removal of nonsurgical wound dressing: Secondary | ICD-10-CM | POA: Diagnosis not present

## 2016-07-06 DIAGNOSIS — J449 Chronic obstructive pulmonary disease, unspecified: Secondary | ICD-10-CM | POA: Diagnosis not present

## 2016-07-07 DIAGNOSIS — G4733 Obstructive sleep apnea (adult) (pediatric): Secondary | ICD-10-CM | POA: Diagnosis not present

## 2016-07-07 DIAGNOSIS — J449 Chronic obstructive pulmonary disease, unspecified: Secondary | ICD-10-CM | POA: Diagnosis not present

## 2016-07-07 DIAGNOSIS — R339 Retention of urine, unspecified: Secondary | ICD-10-CM | POA: Diagnosis not present

## 2016-07-07 DIAGNOSIS — K862 Cyst of pancreas: Secondary | ICD-10-CM | POA: Diagnosis not present

## 2016-07-07 DIAGNOSIS — E119 Type 2 diabetes mellitus without complications: Secondary | ICD-10-CM | POA: Diagnosis not present

## 2016-07-07 DIAGNOSIS — I272 Other secondary pulmonary hypertension: Secondary | ICD-10-CM | POA: Diagnosis not present

## 2016-07-08 DIAGNOSIS — R339 Retention of urine, unspecified: Secondary | ICD-10-CM | POA: Diagnosis not present

## 2016-07-08 DIAGNOSIS — E119 Type 2 diabetes mellitus without complications: Secondary | ICD-10-CM | POA: Diagnosis not present

## 2016-07-08 DIAGNOSIS — I272 Other secondary pulmonary hypertension: Secondary | ICD-10-CM | POA: Diagnosis not present

## 2016-07-08 DIAGNOSIS — G4733 Obstructive sleep apnea (adult) (pediatric): Secondary | ICD-10-CM | POA: Diagnosis not present

## 2016-07-08 DIAGNOSIS — K862 Cyst of pancreas: Secondary | ICD-10-CM | POA: Diagnosis not present

## 2016-07-08 DIAGNOSIS — J449 Chronic obstructive pulmonary disease, unspecified: Secondary | ICD-10-CM | POA: Diagnosis not present

## 2016-07-09 ENCOUNTER — Encounter: Payer: Self-pay | Admitting: Internal Medicine

## 2016-07-09 DIAGNOSIS — K862 Cyst of pancreas: Secondary | ICD-10-CM | POA: Diagnosis not present

## 2016-07-09 DIAGNOSIS — R339 Retention of urine, unspecified: Secondary | ICD-10-CM | POA: Diagnosis not present

## 2016-07-09 DIAGNOSIS — E119 Type 2 diabetes mellitus without complications: Secondary | ICD-10-CM | POA: Diagnosis not present

## 2016-07-09 DIAGNOSIS — G4733 Obstructive sleep apnea (adult) (pediatric): Secondary | ICD-10-CM | POA: Diagnosis not present

## 2016-07-09 DIAGNOSIS — I272 Other secondary pulmonary hypertension: Secondary | ICD-10-CM | POA: Diagnosis not present

## 2016-07-09 DIAGNOSIS — J449 Chronic obstructive pulmonary disease, unspecified: Secondary | ICD-10-CM | POA: Diagnosis not present

## 2016-07-12 DIAGNOSIS — R339 Retention of urine, unspecified: Secondary | ICD-10-CM | POA: Diagnosis not present

## 2016-07-12 DIAGNOSIS — J449 Chronic obstructive pulmonary disease, unspecified: Secondary | ICD-10-CM | POA: Diagnosis not present

## 2016-07-12 DIAGNOSIS — K862 Cyst of pancreas: Secondary | ICD-10-CM | POA: Diagnosis not present

## 2016-07-12 DIAGNOSIS — I272 Other secondary pulmonary hypertension: Secondary | ICD-10-CM | POA: Diagnosis not present

## 2016-07-12 DIAGNOSIS — G4733 Obstructive sleep apnea (adult) (pediatric): Secondary | ICD-10-CM | POA: Diagnosis not present

## 2016-07-12 DIAGNOSIS — E119 Type 2 diabetes mellitus without complications: Secondary | ICD-10-CM | POA: Diagnosis not present

## 2016-07-13 DIAGNOSIS — I272 Other secondary pulmonary hypertension: Secondary | ICD-10-CM | POA: Diagnosis not present

## 2016-07-13 DIAGNOSIS — K862 Cyst of pancreas: Secondary | ICD-10-CM | POA: Diagnosis not present

## 2016-07-13 DIAGNOSIS — G4733 Obstructive sleep apnea (adult) (pediatric): Secondary | ICD-10-CM | POA: Diagnosis not present

## 2016-07-13 DIAGNOSIS — R339 Retention of urine, unspecified: Secondary | ICD-10-CM | POA: Diagnosis not present

## 2016-07-13 DIAGNOSIS — J449 Chronic obstructive pulmonary disease, unspecified: Secondary | ICD-10-CM | POA: Diagnosis not present

## 2016-07-13 DIAGNOSIS — E119 Type 2 diabetes mellitus without complications: Secondary | ICD-10-CM | POA: Diagnosis not present

## 2016-07-14 DIAGNOSIS — J449 Chronic obstructive pulmonary disease, unspecified: Secondary | ICD-10-CM | POA: Diagnosis not present

## 2016-07-14 DIAGNOSIS — K862 Cyst of pancreas: Secondary | ICD-10-CM | POA: Diagnosis not present

## 2016-07-14 DIAGNOSIS — R339 Retention of urine, unspecified: Secondary | ICD-10-CM | POA: Diagnosis not present

## 2016-07-14 DIAGNOSIS — E119 Type 2 diabetes mellitus without complications: Secondary | ICD-10-CM | POA: Diagnosis not present

## 2016-07-14 DIAGNOSIS — G4733 Obstructive sleep apnea (adult) (pediatric): Secondary | ICD-10-CM | POA: Diagnosis not present

## 2016-07-14 DIAGNOSIS — I272 Other secondary pulmonary hypertension: Secondary | ICD-10-CM | POA: Diagnosis not present

## 2016-07-15 ENCOUNTER — Ambulatory Visit (INDEPENDENT_AMBULATORY_CARE_PROVIDER_SITE_OTHER): Payer: Medicare Other | Admitting: Internal Medicine

## 2016-07-15 ENCOUNTER — Encounter: Payer: Self-pay | Admitting: Internal Medicine

## 2016-07-15 VITALS — BP 102/59 | HR 83 | Ht 72.0 in | Wt 262.0 lb

## 2016-07-15 DIAGNOSIS — I5032 Chronic diastolic (congestive) heart failure: Secondary | ICD-10-CM | POA: Diagnosis not present

## 2016-07-15 DIAGNOSIS — J449 Chronic obstructive pulmonary disease, unspecified: Secondary | ICD-10-CM | POA: Diagnosis not present

## 2016-07-15 DIAGNOSIS — I272 Other secondary pulmonary hypertension: Secondary | ICD-10-CM | POA: Diagnosis not present

## 2016-07-15 DIAGNOSIS — I10 Essential (primary) hypertension: Secondary | ICD-10-CM

## 2016-07-15 DIAGNOSIS — Z950 Presence of cardiac pacemaker: Secondary | ICD-10-CM | POA: Diagnosis not present

## 2016-07-15 DIAGNOSIS — R339 Retention of urine, unspecified: Secondary | ICD-10-CM | POA: Diagnosis not present

## 2016-07-15 DIAGNOSIS — K862 Cyst of pancreas: Secondary | ICD-10-CM | POA: Diagnosis not present

## 2016-07-15 DIAGNOSIS — E119 Type 2 diabetes mellitus without complications: Secondary | ICD-10-CM | POA: Diagnosis not present

## 2016-07-15 DIAGNOSIS — I442 Atrioventricular block, complete: Secondary | ICD-10-CM

## 2016-07-15 DIAGNOSIS — G4733 Obstructive sleep apnea (adult) (pediatric): Secondary | ICD-10-CM | POA: Diagnosis not present

## 2016-07-15 NOTE — Progress Notes (Signed)
HPI Mr. Dozal is here today for ongoing evaluation and management of his DDD PM. He is a pleasant elderly man with multiple medical problems including diastolic heart failure, obesity, copd, DM, HTN, and symptomatic bradycardia due to CHB, s/p PPM insertion. He c/o problems with peripheral edema. He has venous insufficiency. No other complaints. He has chronic class 2 dyspnea and is sedentary. He admits to sodium indiscretion.  Allergies  Allergen Reactions  . Advair Diskus [Fluticasone-Salmeterol] Other (See Comments)    Reaction:  Unknown   . Combivent [Ipratropium-Albuterol] Other (See Comments)    Reaction:  Unknown   . Lotensin Hct [Benazepril-Hydrochlorothiazide] Other (See Comments)    Reaction:  Unknown   . Penicillins Swelling and Other (See Comments)    Reaction:  Facial swelling Has patient had a PCN reaction causing immediate rash, facial/tongue/throat swelling, SOB or lightheadedness with hypotension: Yes Has patient had a PCN reaction causing severe rash involving mucus membranes or skin necrosis: No Has patient had a PCN reaction that required hospitalization No Has patient had a PCN reaction occurring within the last 10 years: No If all of the above answers are "NO", then may proceed with Cephalosporin use.  . Simvastatin Other (See Comments)    Reaction:  Muscle pain      Current Outpatient Prescriptions  Medication Sig Dispense Refill  . acetaminophen (TYLENOL) 500 MG tablet Take 1,000 mg by mouth every 6 (six) hours as needed for mild pain, moderate pain or headache.     Marland Kitchen aspirin EC 81 MG tablet Take 81 mg by mouth daily.     Marland Kitchen atenolol (TENORMIN) 50 MG tablet Take 50 mg by mouth daily.    . furosemide (LASIX) 20 MG tablet Take 20 mg by mouth daily.     . hydrochlorothiazide (HYDRODIURIL) 25 MG tablet Take 25 mg by mouth daily.    . hydrocortisone (ANUSOL-HC) 2.5 % rectal cream Place 1 application rectally 2 (two) times daily as needed for hemorrhoids or  itching.    . insulin glargine (LANTUS) 100 UNIT/ML injection Inject 15 Units into the skin daily.     . insulin lispro (HUMALOG JUNIOR KWIKPEN) 100 UNIT/ML KiwkPen Inject 6-12 Units into the skin 3 (three) times daily with meals. Pt uses per sliding scale.    . losartan (COZAAR) 50 MG tablet Take 50 mg by mouth daily.    . Multiple Vitamin (MULTIVITAMIN WITH MINERALS) TABS tablet Take 1 tablet by mouth daily.    . Multiple Vitamins-Minerals (ICAPS AREDS 2) CAPS Take 1 capsule by mouth daily.     No current facility-administered medications for this visit.      Past Medical History:  Diagnosis Date  . Cardiac pacemaker in situ    for sick sinus syndrome-last placement was 09/2007 [facility]  . Diabetes [facility])   . GERD (gastroesophageal reflux disease)   . Mixed hyperlipidemia   . Obstructive lung disease (HCC)    non compliant with home O2  . Pulmonary hypertension (HCC)   . Sleep apnea   . Thrombocytopenia (HCC)   . Venous insufficiency (chronic) (peripheral)     ROS:   All systems reviewed and negative except as noted in the HPI.   Past Surgical History:  Procedure Laterality Date  . CATARACT EXTRACTION, BILATERAL Bilateral   . KIDNEY STONE SURGERY Left    laser ablation   . PERCUTANEOUS PINNING Right 04/20/2016   Procedure: IRRIGATION AND DEBRIDEMENT RIGHT FOOT, PERCUTANEOUS PINNING SMALL TOE;  Surgeon:  Tarry KosNaiping M Xu, MD;  [facility];  Service: Orthopedics;  Laterality: Right;  . REFRACTIVE SURGERY Right   . ROTATOR CUFF REPAIR Left      Family History  Problem Relation Age of Onset  . Diabetes Mother      Social History   Social History  . Marital status: Married    Spouse name: N/A  . Number of children: N/A  . Years of education: N/A   Occupational History  . Not on file.   Social History Main Topics  . Smoking status: Former Smoker    Packs/day: 0.50    Years: 30.00    Types: Cigarettes    Start date: 10/18/1948    Quit date: 10/18/1978  .  Smokeless tobacco: Never Used  . Alcohol use No  . Drug use: No  . Sexual activity: No   Other Topics Concern  . Not on file   Social History Narrative  . No narrative on file     BP (!) 102/59   Pulse 83   Ht 6' (1.829 m)   Wt 262 lb (118.8 kg)   SpO2 93%   BMI 35.53 kg/m   Physical Exam:  Well appearing 82 yo man, NAD HEENT: Unremarkable Neck:  7 cm JVD, no thyromegally Lymphatics:  No adenopathy Back:  No CVA tenderness Lungs:  Clear except for basilar rales. HEART:  Regular rate rhythm, distant, no murmurs, no rubs, no clicks Abd:  soft, positive bowel sounds, no organomegally, no rebound, no guarding Ext:  2 plus pulses, 2+ peripheral edema, no cyanosis, no clubbing Skin:  No rashes no nodules Neuro:  CN II through XII intact, motor grossly intact  DEVICE  Normal device function.  See PaceArt for details.   Assess/Plan: 1. Complete heart block - he is asymptomatic, s/p PPM 2. PPM- his medtronic DDD PM is working normally. Will recheck in several months. 3. Diastolic CHF - his symptoms are class 2. He is sedentary. I have encouraged him to reduce his salt intake.  4. HTN - his blood pressure is well controlled. He will continue his current meds.  5. PAF - his PPM shows HR's in the 280's which likely represents atrial fib/flutter. I do not think he is a candidate for systemic anti-coagulation as his family notes he has fallen 5 times in the past month.  Leonia ReevesGregg Taylor,M.D.

## 2016-07-15 NOTE — Patient Instructions (Signed)
Your physician wants you to follow-up in: 1 Year with Dr. Ladona Ridgelaylor. You will receive a reminder letter in the mail two months in advance. If you don't receive a letter, please call our office to schedule the follow-up appointment.  Your physician recommends that you continue on your current medications as directed. Please refer to the Current Medication list given to you today.  Remote monitoring is used to monitor your Pacemaker of ICD from home. This monitoring reduces the number of office visits required to check your device to one time per year. It allows us to keep an eye on the functioning of your device to ensure it is working properly. You are scheduled for a device check from home on 10/13/16. You may send your transmission at any time that day. If you have a wireless device, the transmission will be sent automatically. After your physician reviews your transmission, you will receive a postcard with your next transmission date.  If you need a refill on your cardiac medications before your next appointment, please call your pharmacy.  Thank you for choosing [facility]!

## 2016-07-16 DIAGNOSIS — E1141 Type 2 diabetes mellitus with diabetic mononeuropathy: Secondary | ICD-10-CM | POA: Diagnosis not present

## 2016-07-16 DIAGNOSIS — S51001D Unspecified open wound of right elbow, subsequent encounter: Secondary | ICD-10-CM | POA: Diagnosis not present

## 2016-07-16 DIAGNOSIS — S91301D Unspecified open wound, right foot, subsequent encounter: Secondary | ICD-10-CM | POA: Diagnosis not present

## 2016-07-19 DIAGNOSIS — I10 Essential (primary) hypertension: Secondary | ICD-10-CM | POA: Diagnosis not present

## 2016-07-19 DIAGNOSIS — J449 Chronic obstructive pulmonary disease, unspecified: Secondary | ICD-10-CM | POA: Diagnosis not present

## 2016-07-19 DIAGNOSIS — I272 Other secondary pulmonary hypertension: Secondary | ICD-10-CM | POA: Diagnosis not present

## 2016-07-19 DIAGNOSIS — R339 Retention of urine, unspecified: Secondary | ICD-10-CM | POA: Diagnosis not present

## 2016-07-19 DIAGNOSIS — G589 Mononeuropathy, unspecified: Secondary | ICD-10-CM | POA: Diagnosis not present

## 2016-07-19 DIAGNOSIS — Z950 Presence of cardiac pacemaker: Secondary | ICD-10-CM | POA: Diagnosis not present

## 2016-07-19 DIAGNOSIS — K862 Cyst of pancreas: Secondary | ICD-10-CM | POA: Diagnosis not present

## 2016-07-19 DIAGNOSIS — E782 Mixed hyperlipidemia: Secondary | ICD-10-CM | POA: Diagnosis not present

## 2016-07-19 DIAGNOSIS — D696 Thrombocytopenia, unspecified: Secondary | ICD-10-CM | POA: Diagnosis not present

## 2016-07-19 DIAGNOSIS — G4733 Obstructive sleep apnea (adult) (pediatric): Secondary | ICD-10-CM | POA: Diagnosis not present

## 2016-07-19 DIAGNOSIS — E1141 Type 2 diabetes mellitus with diabetic mononeuropathy: Secondary | ICD-10-CM | POA: Diagnosis not present

## 2016-07-19 DIAGNOSIS — I872 Venous insufficiency (chronic) (peripheral): Secondary | ICD-10-CM | POA: Diagnosis not present

## 2016-07-19 DIAGNOSIS — E119 Type 2 diabetes mellitus without complications: Secondary | ICD-10-CM | POA: Diagnosis not present

## 2016-07-20 DIAGNOSIS — R339 Retention of urine, unspecified: Secondary | ICD-10-CM | POA: Diagnosis not present

## 2016-07-20 DIAGNOSIS — E119 Type 2 diabetes mellitus without complications: Secondary | ICD-10-CM | POA: Diagnosis not present

## 2016-07-20 DIAGNOSIS — I272 Other secondary pulmonary hypertension: Secondary | ICD-10-CM | POA: Diagnosis not present

## 2016-07-20 DIAGNOSIS — G4733 Obstructive sleep apnea (adult) (pediatric): Secondary | ICD-10-CM | POA: Diagnosis not present

## 2016-07-20 DIAGNOSIS — K862 Cyst of pancreas: Secondary | ICD-10-CM | POA: Diagnosis not present

## 2016-07-20 DIAGNOSIS — J449 Chronic obstructive pulmonary disease, unspecified: Secondary | ICD-10-CM | POA: Diagnosis not present

## 2016-07-22 DIAGNOSIS — J449 Chronic obstructive pulmonary disease, unspecified: Secondary | ICD-10-CM | POA: Diagnosis not present

## 2016-07-22 DIAGNOSIS — E119 Type 2 diabetes mellitus without complications: Secondary | ICD-10-CM | POA: Diagnosis not present

## 2016-07-22 DIAGNOSIS — R339 Retention of urine, unspecified: Secondary | ICD-10-CM | POA: Diagnosis not present

## 2016-07-22 DIAGNOSIS — I272 Other secondary pulmonary hypertension: Secondary | ICD-10-CM | POA: Diagnosis not present

## 2016-07-22 DIAGNOSIS — G4733 Obstructive sleep apnea (adult) (pediatric): Secondary | ICD-10-CM | POA: Diagnosis not present

## 2016-07-22 DIAGNOSIS — K862 Cyst of pancreas: Secondary | ICD-10-CM | POA: Diagnosis not present

## 2016-07-23 DIAGNOSIS — G4733 Obstructive sleep apnea (adult) (pediatric): Secondary | ICD-10-CM | POA: Diagnosis not present

## 2016-07-23 DIAGNOSIS — K862 Cyst of pancreas: Secondary | ICD-10-CM | POA: Diagnosis not present

## 2016-07-23 DIAGNOSIS — J449 Chronic obstructive pulmonary disease, unspecified: Secondary | ICD-10-CM | POA: Diagnosis not present

## 2016-07-23 DIAGNOSIS — I272 Other secondary pulmonary hypertension: Secondary | ICD-10-CM | POA: Diagnosis not present

## 2016-07-23 DIAGNOSIS — E119 Type 2 diabetes mellitus without complications: Secondary | ICD-10-CM | POA: Diagnosis not present

## 2016-07-23 DIAGNOSIS — R339 Retention of urine, unspecified: Secondary | ICD-10-CM | POA: Diagnosis not present

## 2016-07-27 DIAGNOSIS — K862 Cyst of pancreas: Secondary | ICD-10-CM | POA: Diagnosis not present

## 2016-07-27 DIAGNOSIS — G4733 Obstructive sleep apnea (adult) (pediatric): Secondary | ICD-10-CM | POA: Diagnosis not present

## 2016-07-27 DIAGNOSIS — E119 Type 2 diabetes mellitus without complications: Secondary | ICD-10-CM | POA: Diagnosis not present

## 2016-07-27 DIAGNOSIS — I272 Other secondary pulmonary hypertension: Secondary | ICD-10-CM | POA: Diagnosis not present

## 2016-07-27 DIAGNOSIS — R339 Retention of urine, unspecified: Secondary | ICD-10-CM | POA: Diagnosis not present

## 2016-07-27 DIAGNOSIS — J449 Chronic obstructive pulmonary disease, unspecified: Secondary | ICD-10-CM | POA: Diagnosis not present

## 2016-07-28 DIAGNOSIS — E119 Type 2 diabetes mellitus without complications: Secondary | ICD-10-CM | POA: Diagnosis not present

## 2016-07-28 DIAGNOSIS — I272 Other secondary pulmonary hypertension: Secondary | ICD-10-CM | POA: Diagnosis not present

## 2016-07-28 DIAGNOSIS — R339 Retention of urine, unspecified: Secondary | ICD-10-CM | POA: Diagnosis not present

## 2016-07-28 DIAGNOSIS — J449 Chronic obstructive pulmonary disease, unspecified: Secondary | ICD-10-CM | POA: Diagnosis not present

## 2016-07-28 DIAGNOSIS — K862 Cyst of pancreas: Secondary | ICD-10-CM | POA: Diagnosis not present

## 2016-07-28 DIAGNOSIS — G4733 Obstructive sleep apnea (adult) (pediatric): Secondary | ICD-10-CM | POA: Diagnosis not present

## 2016-07-29 DIAGNOSIS — R339 Retention of urine, unspecified: Secondary | ICD-10-CM | POA: Diagnosis not present

## 2016-07-29 DIAGNOSIS — I272 Other secondary pulmonary hypertension: Secondary | ICD-10-CM | POA: Diagnosis not present

## 2016-07-29 DIAGNOSIS — K862 Cyst of pancreas: Secondary | ICD-10-CM | POA: Diagnosis not present

## 2016-07-29 DIAGNOSIS — G4733 Obstructive sleep apnea (adult) (pediatric): Secondary | ICD-10-CM | POA: Diagnosis not present

## 2016-07-29 DIAGNOSIS — E119 Type 2 diabetes mellitus without complications: Secondary | ICD-10-CM | POA: Diagnosis not present

## 2016-07-29 DIAGNOSIS — J449 Chronic obstructive pulmonary disease, unspecified: Secondary | ICD-10-CM | POA: Diagnosis not present

## 2016-07-30 DIAGNOSIS — J449 Chronic obstructive pulmonary disease, unspecified: Secondary | ICD-10-CM | POA: Diagnosis not present

## 2016-07-30 DIAGNOSIS — I272 Other secondary pulmonary hypertension: Secondary | ICD-10-CM | POA: Diagnosis not present

## 2016-07-30 DIAGNOSIS — K862 Cyst of pancreas: Secondary | ICD-10-CM | POA: Diagnosis not present

## 2016-07-30 DIAGNOSIS — R339 Retention of urine, unspecified: Secondary | ICD-10-CM | POA: Diagnosis not present

## 2016-07-30 DIAGNOSIS — E119 Type 2 diabetes mellitus without complications: Secondary | ICD-10-CM | POA: Diagnosis not present

## 2016-07-30 DIAGNOSIS — G4733 Obstructive sleep apnea (adult) (pediatric): Secondary | ICD-10-CM | POA: Diagnosis not present

## 2016-08-03 ENCOUNTER — Ambulatory Visit (INDEPENDENT_AMBULATORY_CARE_PROVIDER_SITE_OTHER): Payer: Medicare Other | Admitting: Cardiology

## 2016-08-03 ENCOUNTER — Ambulatory Visit: Payer: BLUE CROSS/BLUE SHIELD | Admitting: Cardiology

## 2016-08-03 ENCOUNTER — Encounter: Payer: Self-pay | Admitting: *Deleted

## 2016-08-03 ENCOUNTER — Encounter: Payer: Self-pay | Admitting: Cardiology

## 2016-08-03 VITALS — BP 97/60 | HR 67 | Ht 72.0 in | Wt 256.0 lb

## 2016-08-03 DIAGNOSIS — I10 Essential (primary) hypertension: Secondary | ICD-10-CM

## 2016-08-03 DIAGNOSIS — Z23 Encounter for immunization: Secondary | ICD-10-CM | POA: Diagnosis not present

## 2016-08-03 DIAGNOSIS — I272 Other secondary pulmonary hypertension: Secondary | ICD-10-CM | POA: Diagnosis not present

## 2016-08-03 DIAGNOSIS — G4733 Obstructive sleep apnea (adult) (pediatric): Secondary | ICD-10-CM | POA: Diagnosis not present

## 2016-08-03 DIAGNOSIS — J449 Chronic obstructive pulmonary disease, unspecified: Secondary | ICD-10-CM | POA: Diagnosis not present

## 2016-08-03 DIAGNOSIS — E782 Mixed hyperlipidemia: Secondary | ICD-10-CM | POA: Diagnosis not present

## 2016-08-03 DIAGNOSIS — R339 Retention of urine, unspecified: Secondary | ICD-10-CM | POA: Diagnosis not present

## 2016-08-03 DIAGNOSIS — E119 Type 2 diabetes mellitus without complications: Secondary | ICD-10-CM | POA: Diagnosis not present

## 2016-08-03 DIAGNOSIS — I495 Sick sinus syndrome: Secondary | ICD-10-CM | POA: Diagnosis not present

## 2016-08-03 DIAGNOSIS — K862 Cyst of pancreas: Secondary | ICD-10-CM | POA: Diagnosis not present

## 2016-08-03 NOTE — Progress Notes (Signed)
Clinical Summary Mr. Daudelin is a 82 y.o.male seen today for follow up of the following medical problems.   1.Sick sinus syndrome - history of pacemaker placement in 1996. Replaced in 2008 - previously followed at [facility]. From there notes he has a MDT Adapta DR device placed in 2008.   - device check 06/2016 normal function - denies any lightheadnedness, no fatigue since last visit   2. DM2 - followed by pcp   3. Hyperlipidemia - compliant with statin - 06/2016 TC 157 TG 262 HDL 31 LDL 74 - no longer taking statin for unclear reasons. He does have elevated LFTs, perhaps related.    4. HTN - does not check regularly - he is compliant with meds  5. OSA - history of OSA, has not wanted to wear CPAP  6. Aflutter - noted on last device check - frequent falls, has not been started on anticoag.   7. Chronic LE edema - uncomfortable with compression stockings - compliant with lasix Past Medical History:  Diagnosis Date  . Cardiac pacemaker in situ    for sick sinus syndrome-last placement was 09/2007 [facility]  . Diabetes [facility])   . GERD (gastroesophageal reflux disease)   . Mixed hyperlipidemia   . Obstructive lung disease (HCC)    non compliant with home O2  . Pulmonary hypertension   . Sleep apnea   . Thrombocytopenia (HCC)   . Venous insufficiency (chronic) (peripheral)      Allergies  Allergen Reactions  . Advair Diskus [Fluticasone-Salmeterol] Other (See Comments)    Reaction:  Unknown   . Combivent [Ipratropium-Albuterol] Other (See Comments)    Reaction:  Unknown   . Lotensin Hct [Benazepril-Hydrochlorothiazide] Other (See Comments)    Reaction:  Unknown   . Penicillins Swelling and Other (See Comments)    Reaction:  Facial swelling Has patient had a PCN reaction causing immediate rash, facial/tongue/throat swelling, SOB or lightheadedness with hypotension: Yes Has patient had a PCN reaction causing severe rash involving mucus membranes or  skin necrosis: No Has patient had a PCN reaction that required hospitalization No Has patient had a PCN reaction occurring within the last 10 years: No If all of the above answers are "NO", then may proceed with Cephalosporin use.  . Simvastatin Other (See Comments)    Reaction:  Muscle pain      Current Outpatient Prescriptions  Medication Sig Dispense Refill  . acetaminophen (TYLENOL) 500 MG tablet Take 1,000 mg by mouth every 6 (six) hours as needed for mild pain, moderate pain or headache.     Marland Kitchen aspirin EC 81 MG tablet Take 81 mg by mouth daily.     Marland Kitchen atenolol (TENORMIN) 50 MG tablet Take 50 mg by mouth daily.    . furosemide (LASIX) 20 MG tablet Take 20 mg by mouth daily.     . hydrochlorothiazide (HYDRODIURIL) 25 MG tablet Take 25 mg by mouth daily.    . hydrocortisone (ANUSOL-HC) 2.5 % rectal cream Place 1 application rectally 2 (two) times daily as needed for hemorrhoids or itching.    . insulin glargine (LANTUS) 100 UNIT/ML injection Inject 15 Units into the skin daily.     . insulin lispro (HUMALOG JUNIOR KWIKPEN) 100 UNIT/ML KiwkPen Inject 6-12 Units into the skin 3 (three) times daily with meals. Pt uses per sliding scale.    . losartan (COZAAR) 50 MG tablet Take 50 mg by mouth daily.    . Multiple Vitamin (MULTIVITAMIN WITH MINERALS) TABS tablet  Take 1 tablet by mouth daily.    . Multiple Vitamins-Minerals (ICAPS AREDS 2) CAPS Take 1 capsule by mouth daily.     No current facility-administered medications for this visit.      Past Surgical History:  Procedure Laterality Date  . CATARACT EXTRACTION, BILATERAL Bilateral   . KIDNEY STONE SURGERY Left    laser ablation   . PERCUTANEOUS PINNING Right 04/20/2016   Procedure: IRRIGATION AND DEBRIDEMENT RIGHT FOOT, PERCUTANEOUS PINNING SMALL TOE;  Surgeon: Tarry KosNaiping M Xu, MD;  Location: [facility] OR;  Service: Orthopedics;  Laterality: Right;  . REFRACTIVE SURGERY Right   . ROTATOR CUFF REPAIR Left      Allergies  Allergen Reactions   . Advair Diskus [Fluticasone-Salmeterol] Other (See Comments)    Reaction:  Unknown   . Combivent [Ipratropium-Albuterol] Other (See Comments)    Reaction:  Unknown   . Lotensin Hct [Benazepril-Hydrochlorothiazide] Other (See Comments)    Reaction:  Unknown   . Penicillins Swelling and Other (See Comments)    Reaction:  Facial swelling Has patient had a PCN reaction causing immediate rash, facial/tongue/throat swelling, SOB or lightheadedness with hypotension: Yes Has patient had a PCN reaction causing severe rash involving mucus membranes or skin necrosis: No Has patient had a PCN reaction that required hospitalization No Has patient had a PCN reaction occurring within the last 10 years: No If all of the above answers are "NO", then may proceed with Cephalosporin use.  . Simvastatin Other (See Comments)    Reaction:  Muscle pain       Family History  Problem Relation Age of Onset  . Diabetes Mother      Social History Mr. [name] reports that he quit smoking about 37 years ago. His smoking use included Cigarettes. He started smoking about 67 years ago. He has a 15.00 pack-year smoking history. He has never used smokeless tobacco. Mr. [name] reports that he does not drink alcohol.   Review of Systems CONSTITUTIONAL: No weight loss, fever, chills, weakness or fatigue.  HEENT: Eyes: No visual loss, blurred vision, double vision or yellow sclerae.No hearing loss, sneezing, congestion, runny nose or sore throat.  SKIN: No rash or itching.  CARDIOVASCULAR: per HPI RESPIRATORY: No shortness of breath, cough or sputum.  GASTROINTESTINAL: No anorexia, nausea, vomiting or diarrhea. No abdominal pain or blood.  GENITOURINARY: No burning on urination, no polyuria NEUROLOGICAL: No headache, dizziness, syncope, paralysis, ataxia, numbness or tingling in the extremities. No change in bowel or bladder control.  MUSCULOSKELETAL: No muscle, back pain, joint pain or stiffness.  LYMPHATICS: No  enlarged nodes. No history of splenectomy.  PSYCHIATRIC: No history of depression or anxiety.  ENDOCRINOLOGIC: No reports of sweating, cold or heat intolerance. No polyuria or polydipsia.  Marland Kitchen.   Physical Examination Vitals:   08/03/16 1355  BP: 97/60  Pulse: 67   Vitals:   08/03/16 1355  Weight: 256 lb (116.1 kg)  Height: 6' (1.829 m)    Gen: resting comfortably, no acute distress HEENT: no scleral icterus, pupils equal round and reactive, no palptable cervical adenopathy,  CV: RRR, no m/r/g, no jvd Resp: Clear to auscultation bilaterally GI: abdomen is soft, non-tender, non-distended, normal bowel sounds, no hepatosplenomegaly MSK: extremities are warm, no edema.  Skin: warm, no rash Neuro:  no focal deficits Psych: appropriate affect    Assessment and Plan  1. Sick sinus syndrome - no recent symptoms - continue regular device checks  2. Hyperlipidemia - off statin for unclear reasons. Did have recent  elevation in LFTs. Request pcp notes and his repeat labs. He had been on crestor previously.   3. HTN - at goal, cotninue current meds  F/u 6 months      Antoine Poche, M.D.

## 2016-08-03 NOTE — Patient Instructions (Signed)

## 2016-08-04 DIAGNOSIS — I272 Other secondary pulmonary hypertension: Secondary | ICD-10-CM | POA: Diagnosis not present

## 2016-08-04 DIAGNOSIS — R339 Retention of urine, unspecified: Secondary | ICD-10-CM | POA: Diagnosis not present

## 2016-08-04 DIAGNOSIS — K862 Cyst of pancreas: Secondary | ICD-10-CM | POA: Diagnosis not present

## 2016-08-04 DIAGNOSIS — E119 Type 2 diabetes mellitus without complications: Secondary | ICD-10-CM | POA: Diagnosis not present

## 2016-08-04 DIAGNOSIS — J449 Chronic obstructive pulmonary disease, unspecified: Secondary | ICD-10-CM | POA: Diagnosis not present

## 2016-08-04 DIAGNOSIS — G4733 Obstructive sleep apnea (adult) (pediatric): Secondary | ICD-10-CM | POA: Diagnosis not present

## 2016-08-05 DIAGNOSIS — G4733 Obstructive sleep apnea (adult) (pediatric): Secondary | ICD-10-CM | POA: Diagnosis not present

## 2016-08-05 DIAGNOSIS — I272 Other secondary pulmonary hypertension: Secondary | ICD-10-CM | POA: Diagnosis not present

## 2016-08-05 DIAGNOSIS — E119 Type 2 diabetes mellitus without complications: Secondary | ICD-10-CM | POA: Diagnosis not present

## 2016-08-05 DIAGNOSIS — K862 Cyst of pancreas: Secondary | ICD-10-CM | POA: Diagnosis not present

## 2016-08-05 DIAGNOSIS — J449 Chronic obstructive pulmonary disease, unspecified: Secondary | ICD-10-CM | POA: Diagnosis not present

## 2016-08-05 DIAGNOSIS — R339 Retention of urine, unspecified: Secondary | ICD-10-CM | POA: Diagnosis not present

## 2016-08-06 DIAGNOSIS — G4733 Obstructive sleep apnea (adult) (pediatric): Secondary | ICD-10-CM | POA: Diagnosis not present

## 2016-08-06 DIAGNOSIS — R339 Retention of urine, unspecified: Secondary | ICD-10-CM | POA: Diagnosis not present

## 2016-08-06 DIAGNOSIS — J449 Chronic obstructive pulmonary disease, unspecified: Secondary | ICD-10-CM | POA: Diagnosis not present

## 2016-08-06 DIAGNOSIS — K862 Cyst of pancreas: Secondary | ICD-10-CM | POA: Diagnosis not present

## 2016-08-06 DIAGNOSIS — I272 Other secondary pulmonary hypertension: Secondary | ICD-10-CM | POA: Diagnosis not present

## 2016-08-06 DIAGNOSIS — E119 Type 2 diabetes mellitus without complications: Secondary | ICD-10-CM | POA: Diagnosis not present

## 2016-08-09 DIAGNOSIS — G4733 Obstructive sleep apnea (adult) (pediatric): Secondary | ICD-10-CM | POA: Diagnosis not present

## 2016-08-09 DIAGNOSIS — K862 Cyst of pancreas: Secondary | ICD-10-CM | POA: Diagnosis not present

## 2016-08-09 DIAGNOSIS — R339 Retention of urine, unspecified: Secondary | ICD-10-CM | POA: Diagnosis not present

## 2016-08-09 DIAGNOSIS — I272 Other secondary pulmonary hypertension: Secondary | ICD-10-CM | POA: Diagnosis not present

## 2016-08-09 DIAGNOSIS — E119 Type 2 diabetes mellitus without complications: Secondary | ICD-10-CM | POA: Diagnosis not present

## 2016-08-09 DIAGNOSIS — J449 Chronic obstructive pulmonary disease, unspecified: Secondary | ICD-10-CM | POA: Diagnosis not present

## 2016-08-11 DIAGNOSIS — I272 Other secondary pulmonary hypertension: Secondary | ICD-10-CM | POA: Diagnosis not present

## 2016-08-11 DIAGNOSIS — R339 Retention of urine, unspecified: Secondary | ICD-10-CM | POA: Diagnosis not present

## 2016-08-11 DIAGNOSIS — J449 Chronic obstructive pulmonary disease, unspecified: Secondary | ICD-10-CM | POA: Diagnosis not present

## 2016-08-11 DIAGNOSIS — E119 Type 2 diabetes mellitus without complications: Secondary | ICD-10-CM | POA: Diagnosis not present

## 2016-08-11 DIAGNOSIS — G4733 Obstructive sleep apnea (adult) (pediatric): Secondary | ICD-10-CM | POA: Diagnosis not present

## 2016-08-11 DIAGNOSIS — K862 Cyst of pancreas: Secondary | ICD-10-CM | POA: Diagnosis not present

## 2016-08-12 DIAGNOSIS — R339 Retention of urine, unspecified: Secondary | ICD-10-CM | POA: Diagnosis not present

## 2016-08-12 DIAGNOSIS — K862 Cyst of pancreas: Secondary | ICD-10-CM | POA: Diagnosis not present

## 2016-08-12 DIAGNOSIS — I272 Other secondary pulmonary hypertension: Secondary | ICD-10-CM | POA: Diagnosis not present

## 2016-08-12 DIAGNOSIS — E119 Type 2 diabetes mellitus without complications: Secondary | ICD-10-CM | POA: Diagnosis not present

## 2016-08-12 DIAGNOSIS — J449 Chronic obstructive pulmonary disease, unspecified: Secondary | ICD-10-CM | POA: Diagnosis not present

## 2016-08-12 DIAGNOSIS — G4733 Obstructive sleep apnea (adult) (pediatric): Secondary | ICD-10-CM | POA: Diagnosis not present

## 2016-08-13 DIAGNOSIS — K862 Cyst of pancreas: Secondary | ICD-10-CM | POA: Diagnosis not present

## 2016-08-13 DIAGNOSIS — J449 Chronic obstructive pulmonary disease, unspecified: Secondary | ICD-10-CM | POA: Diagnosis not present

## 2016-08-13 DIAGNOSIS — I272 Other secondary pulmonary hypertension: Secondary | ICD-10-CM | POA: Diagnosis not present

## 2016-08-13 DIAGNOSIS — G4733 Obstructive sleep apnea (adult) (pediatric): Secondary | ICD-10-CM | POA: Diagnosis not present

## 2016-08-13 DIAGNOSIS — E119 Type 2 diabetes mellitus without complications: Secondary | ICD-10-CM | POA: Diagnosis not present

## 2016-08-13 DIAGNOSIS — R339 Retention of urine, unspecified: Secondary | ICD-10-CM | POA: Diagnosis not present

## 2016-08-16 DIAGNOSIS — E119 Type 2 diabetes mellitus without complications: Secondary | ICD-10-CM | POA: Diagnosis not present

## 2016-08-16 DIAGNOSIS — J449 Chronic obstructive pulmonary disease, unspecified: Secondary | ICD-10-CM | POA: Diagnosis not present

## 2016-08-16 DIAGNOSIS — I272 Other secondary pulmonary hypertension: Secondary | ICD-10-CM | POA: Diagnosis not present

## 2016-08-16 DIAGNOSIS — R339 Retention of urine, unspecified: Secondary | ICD-10-CM | POA: Diagnosis not present

## 2016-08-16 DIAGNOSIS — K862 Cyst of pancreas: Secondary | ICD-10-CM | POA: Diagnosis not present

## 2016-08-16 DIAGNOSIS — G4733 Obstructive sleep apnea (adult) (pediatric): Secondary | ICD-10-CM | POA: Diagnosis not present

## 2016-08-18 DIAGNOSIS — G589 Mononeuropathy, unspecified: Secondary | ICD-10-CM | POA: Diagnosis not present

## 2016-08-18 DIAGNOSIS — I10 Essential (primary) hypertension: Secondary | ICD-10-CM | POA: Diagnosis not present

## 2016-08-18 DIAGNOSIS — Z950 Presence of cardiac pacemaker: Secondary | ICD-10-CM | POA: Diagnosis not present

## 2016-08-18 DIAGNOSIS — J449 Chronic obstructive pulmonary disease, unspecified: Secondary | ICD-10-CM | POA: Diagnosis not present

## 2016-08-18 DIAGNOSIS — I272 Other secondary pulmonary hypertension: Secondary | ICD-10-CM | POA: Diagnosis not present

## 2016-08-18 DIAGNOSIS — K862 Cyst of pancreas: Secondary | ICD-10-CM | POA: Diagnosis not present

## 2016-08-18 DIAGNOSIS — R339 Retention of urine, unspecified: Secondary | ICD-10-CM | POA: Diagnosis not present

## 2016-08-18 DIAGNOSIS — E782 Mixed hyperlipidemia: Secondary | ICD-10-CM | POA: Diagnosis not present

## 2016-08-18 DIAGNOSIS — E1141 Type 2 diabetes mellitus with diabetic mononeuropathy: Secondary | ICD-10-CM | POA: Diagnosis not present

## 2016-08-18 DIAGNOSIS — E119 Type 2 diabetes mellitus without complications: Secondary | ICD-10-CM | POA: Diagnosis not present

## 2016-08-18 DIAGNOSIS — D696 Thrombocytopenia, unspecified: Secondary | ICD-10-CM | POA: Diagnosis not present

## 2016-08-18 DIAGNOSIS — G4733 Obstructive sleep apnea (adult) (pediatric): Secondary | ICD-10-CM | POA: Diagnosis not present

## 2016-08-18 DIAGNOSIS — I872 Venous insufficiency (chronic) (peripheral): Secondary | ICD-10-CM | POA: Diagnosis not present

## 2016-08-19 DIAGNOSIS — G4733 Obstructive sleep apnea (adult) (pediatric): Secondary | ICD-10-CM | POA: Diagnosis not present

## 2016-08-19 DIAGNOSIS — I272 Other secondary pulmonary hypertension: Secondary | ICD-10-CM | POA: Diagnosis not present

## 2016-08-19 DIAGNOSIS — K862 Cyst of pancreas: Secondary | ICD-10-CM | POA: Diagnosis not present

## 2016-08-19 DIAGNOSIS — J449 Chronic obstructive pulmonary disease, unspecified: Secondary | ICD-10-CM | POA: Diagnosis not present

## 2016-08-19 DIAGNOSIS — E119 Type 2 diabetes mellitus without complications: Secondary | ICD-10-CM | POA: Diagnosis not present

## 2016-08-19 DIAGNOSIS — R339 Retention of urine, unspecified: Secondary | ICD-10-CM | POA: Diagnosis not present

## 2016-08-20 DIAGNOSIS — J449 Chronic obstructive pulmonary disease, unspecified: Secondary | ICD-10-CM | POA: Diagnosis not present

## 2016-08-20 DIAGNOSIS — K862 Cyst of pancreas: Secondary | ICD-10-CM | POA: Diagnosis not present

## 2016-08-20 DIAGNOSIS — I272 Other secondary pulmonary hypertension: Secondary | ICD-10-CM | POA: Diagnosis not present

## 2016-08-20 DIAGNOSIS — E119 Type 2 diabetes mellitus without complications: Secondary | ICD-10-CM | POA: Diagnosis not present

## 2016-08-20 DIAGNOSIS — G4733 Obstructive sleep apnea (adult) (pediatric): Secondary | ICD-10-CM | POA: Diagnosis not present

## 2016-08-20 DIAGNOSIS — R339 Retention of urine, unspecified: Secondary | ICD-10-CM | POA: Diagnosis not present

## 2016-08-23 DIAGNOSIS — R339 Retention of urine, unspecified: Secondary | ICD-10-CM | POA: Diagnosis not present

## 2016-08-23 DIAGNOSIS — E119 Type 2 diabetes mellitus without complications: Secondary | ICD-10-CM | POA: Diagnosis not present

## 2016-08-23 DIAGNOSIS — K862 Cyst of pancreas: Secondary | ICD-10-CM | POA: Diagnosis not present

## 2016-08-23 DIAGNOSIS — G4733 Obstructive sleep apnea (adult) (pediatric): Secondary | ICD-10-CM | POA: Diagnosis not present

## 2016-08-23 DIAGNOSIS — I272 Other secondary pulmonary hypertension: Secondary | ICD-10-CM | POA: Diagnosis not present

## 2016-08-23 DIAGNOSIS — J449 Chronic obstructive pulmonary disease, unspecified: Secondary | ICD-10-CM | POA: Diagnosis not present

## 2016-08-24 DIAGNOSIS — Z6835 Body mass index (BMI) 35.0-35.9, adult: Secondary | ICD-10-CM | POA: Diagnosis not present

## 2016-08-24 DIAGNOSIS — S51001D Unspecified open wound of right elbow, subsequent encounter: Secondary | ICD-10-CM | POA: Diagnosis not present

## 2016-08-24 DIAGNOSIS — I959 Hypotension, unspecified: Secondary | ICD-10-CM | POA: Diagnosis not present

## 2016-08-24 DIAGNOSIS — S91204A Unspecified open wound of right lesser toe(s) with damage to nail, initial encounter: Secondary | ICD-10-CM | POA: Diagnosis not present

## 2016-08-25 DIAGNOSIS — R339 Retention of urine, unspecified: Secondary | ICD-10-CM | POA: Diagnosis not present

## 2016-08-25 DIAGNOSIS — K862 Cyst of pancreas: Secondary | ICD-10-CM | POA: Diagnosis not present

## 2016-08-25 DIAGNOSIS — J449 Chronic obstructive pulmonary disease, unspecified: Secondary | ICD-10-CM | POA: Diagnosis not present

## 2016-08-25 DIAGNOSIS — G4733 Obstructive sleep apnea (adult) (pediatric): Secondary | ICD-10-CM | POA: Diagnosis not present

## 2016-08-25 DIAGNOSIS — E119 Type 2 diabetes mellitus without complications: Secondary | ICD-10-CM | POA: Diagnosis not present

## 2016-08-25 DIAGNOSIS — I272 Other secondary pulmonary hypertension: Secondary | ICD-10-CM | POA: Diagnosis not present

## 2016-08-27 DIAGNOSIS — R339 Retention of urine, unspecified: Secondary | ICD-10-CM | POA: Diagnosis not present

## 2016-08-27 DIAGNOSIS — K862 Cyst of pancreas: Secondary | ICD-10-CM | POA: Diagnosis not present

## 2016-08-27 DIAGNOSIS — Z7982 Long term (current) use of aspirin: Secondary | ICD-10-CM | POA: Diagnosis not present

## 2016-08-27 DIAGNOSIS — S51011D Laceration without foreign body of right elbow, subsequent encounter: Secondary | ICD-10-CM | POA: Diagnosis not present

## 2016-08-27 DIAGNOSIS — K567 Ileus, unspecified: Secondary | ICD-10-CM | POA: Diagnosis not present

## 2016-08-27 DIAGNOSIS — J449 Chronic obstructive pulmonary disease, unspecified: Secondary | ICD-10-CM | POA: Diagnosis not present

## 2016-08-27 DIAGNOSIS — I272 Pulmonary hypertension, unspecified: Secondary | ICD-10-CM | POA: Diagnosis not present

## 2016-08-27 DIAGNOSIS — Z950 Presence of cardiac pacemaker: Secondary | ICD-10-CM | POA: Diagnosis not present

## 2016-08-27 DIAGNOSIS — I872 Venous insufficiency (chronic) (peripheral): Secondary | ICD-10-CM | POA: Diagnosis not present

## 2016-08-27 DIAGNOSIS — Z794 Long term (current) use of insulin: Secondary | ICD-10-CM | POA: Diagnosis not present

## 2016-08-27 DIAGNOSIS — Z87891 Personal history of nicotine dependence: Secondary | ICD-10-CM | POA: Diagnosis not present

## 2016-08-27 DIAGNOSIS — Z792 Long term (current) use of antibiotics: Secondary | ICD-10-CM | POA: Diagnosis not present

## 2016-08-27 DIAGNOSIS — E785 Hyperlipidemia, unspecified: Secondary | ICD-10-CM | POA: Diagnosis not present

## 2016-08-27 DIAGNOSIS — S81801D Unspecified open wound, right lower leg, subsequent encounter: Secondary | ICD-10-CM | POA: Diagnosis not present

## 2016-08-27 DIAGNOSIS — S91104D Unspecified open wound of right lesser toe(s) without damage to nail, subsequent encounter: Secondary | ICD-10-CM | POA: Diagnosis not present

## 2016-08-27 DIAGNOSIS — G4733 Obstructive sleep apnea (adult) (pediatric): Secondary | ICD-10-CM | POA: Diagnosis not present

## 2016-08-27 DIAGNOSIS — E119 Type 2 diabetes mellitus without complications: Secondary | ICD-10-CM | POA: Diagnosis not present

## 2016-08-27 DIAGNOSIS — E669 Obesity, unspecified: Secondary | ICD-10-CM | POA: Diagnosis not present

## 2016-08-31 DIAGNOSIS — J449 Chronic obstructive pulmonary disease, unspecified: Secondary | ICD-10-CM | POA: Diagnosis not present

## 2016-08-31 DIAGNOSIS — S91104D Unspecified open wound of right lesser toe(s) without damage to nail, subsequent encounter: Secondary | ICD-10-CM | POA: Diagnosis not present

## 2016-08-31 DIAGNOSIS — S51011D Laceration without foreign body of right elbow, subsequent encounter: Secondary | ICD-10-CM | POA: Diagnosis not present

## 2016-08-31 DIAGNOSIS — I872 Venous insufficiency (chronic) (peripheral): Secondary | ICD-10-CM | POA: Diagnosis not present

## 2016-08-31 DIAGNOSIS — E119 Type 2 diabetes mellitus without complications: Secondary | ICD-10-CM | POA: Diagnosis not present

## 2016-08-31 DIAGNOSIS — S81801D Unspecified open wound, right lower leg, subsequent encounter: Secondary | ICD-10-CM | POA: Diagnosis not present

## 2016-09-02 DIAGNOSIS — S91104D Unspecified open wound of right lesser toe(s) without damage to nail, subsequent encounter: Secondary | ICD-10-CM | POA: Diagnosis not present

## 2016-09-02 DIAGNOSIS — J449 Chronic obstructive pulmonary disease, unspecified: Secondary | ICD-10-CM | POA: Diagnosis not present

## 2016-09-02 DIAGNOSIS — I872 Venous insufficiency (chronic) (peripheral): Secondary | ICD-10-CM | POA: Diagnosis not present

## 2016-09-02 DIAGNOSIS — S81801D Unspecified open wound, right lower leg, subsequent encounter: Secondary | ICD-10-CM | POA: Diagnosis not present

## 2016-09-02 DIAGNOSIS — S51011D Laceration without foreign body of right elbow, subsequent encounter: Secondary | ICD-10-CM | POA: Diagnosis not present

## 2016-09-02 DIAGNOSIS — E119 Type 2 diabetes mellitus without complications: Secondary | ICD-10-CM | POA: Diagnosis not present

## 2016-09-03 DIAGNOSIS — I872 Venous insufficiency (chronic) (peripheral): Secondary | ICD-10-CM | POA: Diagnosis not present

## 2016-09-03 DIAGNOSIS — E119 Type 2 diabetes mellitus without complications: Secondary | ICD-10-CM | POA: Diagnosis not present

## 2016-09-03 DIAGNOSIS — S81801D Unspecified open wound, right lower leg, subsequent encounter: Secondary | ICD-10-CM | POA: Diagnosis not present

## 2016-09-03 DIAGNOSIS — J449 Chronic obstructive pulmonary disease, unspecified: Secondary | ICD-10-CM | POA: Diagnosis not present

## 2016-09-03 DIAGNOSIS — S91104D Unspecified open wound of right lesser toe(s) without damage to nail, subsequent encounter: Secondary | ICD-10-CM | POA: Diagnosis not present

## 2016-09-03 DIAGNOSIS — S51011D Laceration without foreign body of right elbow, subsequent encounter: Secondary | ICD-10-CM | POA: Diagnosis not present

## 2016-09-05 DIAGNOSIS — J449 Chronic obstructive pulmonary disease, unspecified: Secondary | ICD-10-CM | POA: Diagnosis not present

## 2016-09-05 DIAGNOSIS — S91104D Unspecified open wound of right lesser toe(s) without damage to nail, subsequent encounter: Secondary | ICD-10-CM | POA: Diagnosis not present

## 2016-09-05 DIAGNOSIS — E119 Type 2 diabetes mellitus without complications: Secondary | ICD-10-CM | POA: Diagnosis not present

## 2016-09-05 DIAGNOSIS — S81801D Unspecified open wound, right lower leg, subsequent encounter: Secondary | ICD-10-CM | POA: Diagnosis not present

## 2016-09-05 DIAGNOSIS — I872 Venous insufficiency (chronic) (peripheral): Secondary | ICD-10-CM | POA: Diagnosis not present

## 2016-09-05 DIAGNOSIS — S51011D Laceration without foreign body of right elbow, subsequent encounter: Secondary | ICD-10-CM | POA: Diagnosis not present

## 2016-09-06 DIAGNOSIS — S81801D Unspecified open wound, right lower leg, subsequent encounter: Secondary | ICD-10-CM | POA: Diagnosis not present

## 2016-09-06 DIAGNOSIS — S51011D Laceration without foreign body of right elbow, subsequent encounter: Secondary | ICD-10-CM | POA: Diagnosis not present

## 2016-09-06 DIAGNOSIS — S91104D Unspecified open wound of right lesser toe(s) without damage to nail, subsequent encounter: Secondary | ICD-10-CM | POA: Diagnosis not present

## 2016-09-06 DIAGNOSIS — I872 Venous insufficiency (chronic) (peripheral): Secondary | ICD-10-CM | POA: Diagnosis not present

## 2016-09-06 DIAGNOSIS — J449 Chronic obstructive pulmonary disease, unspecified: Secondary | ICD-10-CM | POA: Diagnosis not present

## 2016-09-06 DIAGNOSIS — E119 Type 2 diabetes mellitus without complications: Secondary | ICD-10-CM | POA: Diagnosis not present

## 2016-09-07 DIAGNOSIS — I959 Hypotension, unspecified: Secondary | ICD-10-CM | POA: Diagnosis not present

## 2016-09-07 DIAGNOSIS — S91204D Unspecified open wound of right lesser toe(s) with damage to nail, subsequent encounter: Secondary | ICD-10-CM | POA: Diagnosis not present

## 2016-09-10 DIAGNOSIS — S51011D Laceration without foreign body of right elbow, subsequent encounter: Secondary | ICD-10-CM | POA: Diagnosis not present

## 2016-09-10 DIAGNOSIS — S91104D Unspecified open wound of right lesser toe(s) without damage to nail, subsequent encounter: Secondary | ICD-10-CM | POA: Diagnosis not present

## 2016-09-10 DIAGNOSIS — J449 Chronic obstructive pulmonary disease, unspecified: Secondary | ICD-10-CM | POA: Diagnosis not present

## 2016-09-10 DIAGNOSIS — E119 Type 2 diabetes mellitus without complications: Secondary | ICD-10-CM | POA: Diagnosis not present

## 2016-09-10 DIAGNOSIS — I872 Venous insufficiency (chronic) (peripheral): Secondary | ICD-10-CM | POA: Diagnosis not present

## 2016-09-10 DIAGNOSIS — S81801D Unspecified open wound, right lower leg, subsequent encounter: Secondary | ICD-10-CM | POA: Diagnosis not present

## 2016-09-13 DIAGNOSIS — S51011D Laceration without foreign body of right elbow, subsequent encounter: Secondary | ICD-10-CM | POA: Diagnosis not present

## 2016-09-13 DIAGNOSIS — S81801D Unspecified open wound, right lower leg, subsequent encounter: Secondary | ICD-10-CM | POA: Diagnosis not present

## 2016-09-13 DIAGNOSIS — J449 Chronic obstructive pulmonary disease, unspecified: Secondary | ICD-10-CM | POA: Diagnosis not present

## 2016-09-13 DIAGNOSIS — S91104D Unspecified open wound of right lesser toe(s) without damage to nail, subsequent encounter: Secondary | ICD-10-CM | POA: Diagnosis not present

## 2016-09-13 DIAGNOSIS — E119 Type 2 diabetes mellitus without complications: Secondary | ICD-10-CM | POA: Diagnosis not present

## 2016-09-13 DIAGNOSIS — I872 Venous insufficiency (chronic) (peripheral): Secondary | ICD-10-CM | POA: Diagnosis not present

## 2016-09-14 DIAGNOSIS — E119 Type 2 diabetes mellitus without complications: Secondary | ICD-10-CM | POA: Diagnosis not present

## 2016-09-14 DIAGNOSIS — S81801D Unspecified open wound, right lower leg, subsequent encounter: Secondary | ICD-10-CM | POA: Diagnosis not present

## 2016-09-14 DIAGNOSIS — S51011D Laceration without foreign body of right elbow, subsequent encounter: Secondary | ICD-10-CM | POA: Diagnosis not present

## 2016-09-14 DIAGNOSIS — I872 Venous insufficiency (chronic) (peripheral): Secondary | ICD-10-CM | POA: Diagnosis not present

## 2016-09-14 DIAGNOSIS — S91104D Unspecified open wound of right lesser toe(s) without damage to nail, subsequent encounter: Secondary | ICD-10-CM | POA: Diagnosis not present

## 2016-09-14 DIAGNOSIS — J449 Chronic obstructive pulmonary disease, unspecified: Secondary | ICD-10-CM | POA: Diagnosis not present

## 2016-09-16 DIAGNOSIS — S81801D Unspecified open wound, right lower leg, subsequent encounter: Secondary | ICD-10-CM | POA: Diagnosis not present

## 2016-09-16 DIAGNOSIS — S91104D Unspecified open wound of right lesser toe(s) without damage to nail, subsequent encounter: Secondary | ICD-10-CM | POA: Diagnosis not present

## 2016-09-16 DIAGNOSIS — E119 Type 2 diabetes mellitus without complications: Secondary | ICD-10-CM | POA: Diagnosis not present

## 2016-09-16 DIAGNOSIS — I872 Venous insufficiency (chronic) (peripheral): Secondary | ICD-10-CM | POA: Diagnosis not present

## 2016-09-16 DIAGNOSIS — J449 Chronic obstructive pulmonary disease, unspecified: Secondary | ICD-10-CM | POA: Diagnosis not present

## 2016-09-16 DIAGNOSIS — S51011D Laceration without foreign body of right elbow, subsequent encounter: Secondary | ICD-10-CM | POA: Diagnosis not present

## 2016-09-16 LAB — CUP PACEART INCLINIC DEVICE CHECK
Brady Statistic AP VP Percent: 65 %
Brady Statistic AP VS Percent: 1 %
Brady Statistic AS VP Percent: 34 %
Brady Statistic AS VS Percent: 1 %
Implantable Lead Implant Date: 19960418
Implantable Lead Location: 753859
Implantable Lead Model: 5534
Lead Channel Impedance Value: 930 Ohm
Lead Channel Pacing Threshold Amplitude: 0.5 V
Lead Channel Pacing Threshold Amplitude: 1.25 V
Lead Channel Pacing Threshold Pulse Width: 0.4 ms
Lead Channel Sensing Intrinsic Amplitude: 2.8 mV
Lead Channel Setting Pacing Amplitude: 2 V
MDC IDC LEAD IMPLANT DT: 19960418
MDC IDC LEAD LOCATION: 753860
MDC IDC MSMT BATTERY IMPEDANCE: 2049 Ohm
MDC IDC MSMT BATTERY REMAINING LONGEVITY: 29 mo
MDC IDC MSMT BATTERY VOLTAGE: 2.75 V
MDC IDC MSMT LEADCHNL RA IMPEDANCE VALUE: 849 Ohm
MDC IDC MSMT LEADCHNL RV PACING THRESHOLD PULSEWIDTH: 0.4 ms
MDC IDC PG IMPLANT DT: 20081211
MDC IDC SESS DTM: 20170928132509
MDC IDC SET LEADCHNL RV PACING AMPLITUDE: 2.5 V
MDC IDC SET LEADCHNL RV PACING PULSEWIDTH: 0.4 ms
MDC IDC SET LEADCHNL RV SENSING SENSITIVITY: 2.8 mV

## 2016-09-21 DIAGNOSIS — J449 Chronic obstructive pulmonary disease, unspecified: Secondary | ICD-10-CM | POA: Diagnosis not present

## 2016-09-21 DIAGNOSIS — I872 Venous insufficiency (chronic) (peripheral): Secondary | ICD-10-CM | POA: Diagnosis not present

## 2016-09-21 DIAGNOSIS — S81801D Unspecified open wound, right lower leg, subsequent encounter: Secondary | ICD-10-CM | POA: Diagnosis not present

## 2016-09-21 DIAGNOSIS — E119 Type 2 diabetes mellitus without complications: Secondary | ICD-10-CM | POA: Diagnosis not present

## 2016-09-21 DIAGNOSIS — S91104D Unspecified open wound of right lesser toe(s) without damage to nail, subsequent encounter: Secondary | ICD-10-CM | POA: Diagnosis not present

## 2016-09-21 DIAGNOSIS — S51011D Laceration without foreign body of right elbow, subsequent encounter: Secondary | ICD-10-CM | POA: Diagnosis not present

## 2016-09-22 DIAGNOSIS — J449 Chronic obstructive pulmonary disease, unspecified: Secondary | ICD-10-CM | POA: Diagnosis not present

## 2016-09-22 DIAGNOSIS — E119 Type 2 diabetes mellitus without complications: Secondary | ICD-10-CM | POA: Diagnosis not present

## 2016-09-22 DIAGNOSIS — I872 Venous insufficiency (chronic) (peripheral): Secondary | ICD-10-CM | POA: Diagnosis not present

## 2016-09-22 DIAGNOSIS — S51011D Laceration without foreign body of right elbow, subsequent encounter: Secondary | ICD-10-CM | POA: Diagnosis not present

## 2016-09-22 DIAGNOSIS — S91104D Unspecified open wound of right lesser toe(s) without damage to nail, subsequent encounter: Secondary | ICD-10-CM | POA: Diagnosis not present

## 2016-09-22 DIAGNOSIS — S81801D Unspecified open wound, right lower leg, subsequent encounter: Secondary | ICD-10-CM | POA: Diagnosis not present

## 2016-09-24 DIAGNOSIS — E119 Type 2 diabetes mellitus without complications: Secondary | ICD-10-CM | POA: Diagnosis not present

## 2016-09-24 DIAGNOSIS — S91104D Unspecified open wound of right lesser toe(s) without damage to nail, subsequent encounter: Secondary | ICD-10-CM | POA: Diagnosis not present

## 2016-09-24 DIAGNOSIS — I872 Venous insufficiency (chronic) (peripheral): Secondary | ICD-10-CM | POA: Diagnosis not present

## 2016-09-24 DIAGNOSIS — S81801D Unspecified open wound, right lower leg, subsequent encounter: Secondary | ICD-10-CM | POA: Diagnosis not present

## 2016-09-24 DIAGNOSIS — S51011D Laceration without foreign body of right elbow, subsequent encounter: Secondary | ICD-10-CM | POA: Diagnosis not present

## 2016-09-24 DIAGNOSIS — J449 Chronic obstructive pulmonary disease, unspecified: Secondary | ICD-10-CM | POA: Diagnosis not present

## 2016-09-28 DIAGNOSIS — I872 Venous insufficiency (chronic) (peripheral): Secondary | ICD-10-CM | POA: Diagnosis not present

## 2016-09-28 DIAGNOSIS — S51011D Laceration without foreign body of right elbow, subsequent encounter: Secondary | ICD-10-CM | POA: Diagnosis not present

## 2016-09-28 DIAGNOSIS — S81801D Unspecified open wound, right lower leg, subsequent encounter: Secondary | ICD-10-CM | POA: Diagnosis not present

## 2016-09-28 DIAGNOSIS — S91104D Unspecified open wound of right lesser toe(s) without damage to nail, subsequent encounter: Secondary | ICD-10-CM | POA: Diagnosis not present

## 2016-09-28 DIAGNOSIS — E119 Type 2 diabetes mellitus without complications: Secondary | ICD-10-CM | POA: Diagnosis not present

## 2016-09-28 DIAGNOSIS — J449 Chronic obstructive pulmonary disease, unspecified: Secondary | ICD-10-CM | POA: Diagnosis not present

## 2016-10-01 DIAGNOSIS — I872 Venous insufficiency (chronic) (peripheral): Secondary | ICD-10-CM | POA: Diagnosis not present

## 2016-10-01 DIAGNOSIS — S81801D Unspecified open wound, right lower leg, subsequent encounter: Secondary | ICD-10-CM | POA: Diagnosis not present

## 2016-10-01 DIAGNOSIS — J449 Chronic obstructive pulmonary disease, unspecified: Secondary | ICD-10-CM | POA: Diagnosis not present

## 2016-10-01 DIAGNOSIS — S91104D Unspecified open wound of right lesser toe(s) without damage to nail, subsequent encounter: Secondary | ICD-10-CM | POA: Diagnosis not present

## 2016-10-01 DIAGNOSIS — S51011D Laceration without foreign body of right elbow, subsequent encounter: Secondary | ICD-10-CM | POA: Diagnosis not present

## 2016-10-01 DIAGNOSIS — E119 Type 2 diabetes mellitus without complications: Secondary | ICD-10-CM | POA: Diagnosis not present

## 2016-10-05 DIAGNOSIS — S81801D Unspecified open wound, right lower leg, subsequent encounter: Secondary | ICD-10-CM | POA: Diagnosis not present

## 2016-10-05 DIAGNOSIS — E119 Type 2 diabetes mellitus without complications: Secondary | ICD-10-CM | POA: Diagnosis not present

## 2016-10-05 DIAGNOSIS — J449 Chronic obstructive pulmonary disease, unspecified: Secondary | ICD-10-CM | POA: Diagnosis not present

## 2016-10-05 DIAGNOSIS — S51011D Laceration without foreign body of right elbow, subsequent encounter: Secondary | ICD-10-CM | POA: Diagnosis not present

## 2016-10-05 DIAGNOSIS — S91104D Unspecified open wound of right lesser toe(s) without damage to nail, subsequent encounter: Secondary | ICD-10-CM | POA: Diagnosis not present

## 2016-10-05 DIAGNOSIS — I872 Venous insufficiency (chronic) (peripheral): Secondary | ICD-10-CM | POA: Diagnosis not present

## 2016-10-13 ENCOUNTER — Telehealth: Payer: Self-pay | Admitting: Cardiology

## 2016-10-13 ENCOUNTER — Encounter: Payer: Medicare Other | Admitting: *Deleted

## 2016-10-13 DIAGNOSIS — E119 Type 2 diabetes mellitus without complications: Secondary | ICD-10-CM | POA: Diagnosis not present

## 2016-10-13 DIAGNOSIS — S91104D Unspecified open wound of right lesser toe(s) without damage to nail, subsequent encounter: Secondary | ICD-10-CM | POA: Diagnosis not present

## 2016-10-13 DIAGNOSIS — S51011D Laceration without foreign body of right elbow, subsequent encounter: Secondary | ICD-10-CM | POA: Diagnosis not present

## 2016-10-13 DIAGNOSIS — I872 Venous insufficiency (chronic) (peripheral): Secondary | ICD-10-CM | POA: Diagnosis not present

## 2016-10-13 DIAGNOSIS — S81801D Unspecified open wound, right lower leg, subsequent encounter: Secondary | ICD-10-CM | POA: Diagnosis not present

## 2016-10-13 DIAGNOSIS — J449 Chronic obstructive pulmonary disease, unspecified: Secondary | ICD-10-CM | POA: Diagnosis not present

## 2016-10-13 NOTE — Telephone Encounter (Signed)
Spoke with pt and reminded pt of remote transmission that is due today. Pt verbalized understanding.   

## 2016-10-15 ENCOUNTER — Encounter: Payer: Self-pay | Admitting: Cardiology

## 2016-10-19 ENCOUNTER — Ambulatory Visit (INDEPENDENT_AMBULATORY_CARE_PROVIDER_SITE_OTHER): Payer: Medicare Other | Admitting: *Deleted

## 2016-10-19 DIAGNOSIS — I495 Sick sinus syndrome: Secondary | ICD-10-CM | POA: Diagnosis not present

## 2016-10-21 DIAGNOSIS — S91104D Unspecified open wound of right lesser toe(s) without damage to nail, subsequent encounter: Secondary | ICD-10-CM | POA: Diagnosis not present

## 2016-10-21 DIAGNOSIS — E119 Type 2 diabetes mellitus without complications: Secondary | ICD-10-CM | POA: Diagnosis not present

## 2016-10-21 DIAGNOSIS — S51011D Laceration without foreign body of right elbow, subsequent encounter: Secondary | ICD-10-CM | POA: Diagnosis not present

## 2016-10-21 DIAGNOSIS — S81801D Unspecified open wound, right lower leg, subsequent encounter: Secondary | ICD-10-CM | POA: Diagnosis not present

## 2016-10-21 DIAGNOSIS — I872 Venous insufficiency (chronic) (peripheral): Secondary | ICD-10-CM | POA: Diagnosis not present

## 2016-10-21 DIAGNOSIS — J449 Chronic obstructive pulmonary disease, unspecified: Secondary | ICD-10-CM | POA: Diagnosis not present

## 2016-10-21 NOTE — Progress Notes (Signed)
Remote pacemaker transmission.   

## 2016-10-22 ENCOUNTER — Encounter: Payer: Self-pay | Admitting: Cardiology

## 2016-10-22 LAB — CUP PACEART REMOTE DEVICE CHECK
Battery Impedance: 2295 Ohm
Battery Remaining Longevity: 27 mo
Brady Statistic AS VS Percent: 0 %
Date Time Interrogation Session: 20180101192438
Implantable Lead Implant Date: 19960418
Implantable Lead Implant Date: 19960418
Implantable Lead Location: 753860
Implantable Lead Model: 5034
Implantable Pulse Generator Implant Date: 20081211
Lead Channel Impedance Value: 1185 Ohm
Lead Channel Setting Pacing Amplitude: 2 V
Lead Channel Setting Pacing Amplitude: 2.75 V
Lead Channel Setting Sensing Sensitivity: 2.8 mV
MDC IDC LEAD LOCATION: 753859
MDC IDC MSMT BATTERY VOLTAGE: 2.76 V
MDC IDC MSMT LEADCHNL RA IMPEDANCE VALUE: 886 Ohm
MDC IDC MSMT LEADCHNL RA PACING THRESHOLD AMPLITUDE: 0.5 V
MDC IDC MSMT LEADCHNL RA PACING THRESHOLD PULSEWIDTH: 0.4 ms
MDC IDC MSMT LEADCHNL RV PACING THRESHOLD AMPLITUDE: 1.375 V
MDC IDC MSMT LEADCHNL RV PACING THRESHOLD PULSEWIDTH: 0.4 ms
MDC IDC SET LEADCHNL RV PACING PULSEWIDTH: 0.4 ms
MDC IDC STAT BRADY AP VP PERCENT: 89 %
MDC IDC STAT BRADY AP VS PERCENT: 0 %
MDC IDC STAT BRADY AS VP PERCENT: 10 %

## 2016-10-27 DIAGNOSIS — E782 Mixed hyperlipidemia: Secondary | ICD-10-CM | POA: Diagnosis not present

## 2016-10-27 DIAGNOSIS — E1141 Type 2 diabetes mellitus with diabetic mononeuropathy: Secondary | ICD-10-CM | POA: Diagnosis not present

## 2016-10-29 DIAGNOSIS — Z950 Presence of cardiac pacemaker: Secondary | ICD-10-CM | POA: Diagnosis not present

## 2016-10-29 DIAGNOSIS — D696 Thrombocytopenia, unspecified: Secondary | ICD-10-CM | POA: Diagnosis not present

## 2016-10-29 DIAGNOSIS — E782 Mixed hyperlipidemia: Secondary | ICD-10-CM | POA: Diagnosis not present

## 2016-10-29 DIAGNOSIS — I10 Essential (primary) hypertension: Secondary | ICD-10-CM | POA: Diagnosis not present

## 2016-10-29 DIAGNOSIS — E1141 Type 2 diabetes mellitus with diabetic mononeuropathy: Secondary | ICD-10-CM | POA: Diagnosis not present

## 2016-10-29 DIAGNOSIS — E8809 Other disorders of plasma-protein metabolism, not elsewhere classified: Secondary | ICD-10-CM | POA: Diagnosis not present

## 2016-10-29 DIAGNOSIS — G589 Mononeuropathy, unspecified: Secondary | ICD-10-CM | POA: Diagnosis not present

## 2016-12-30 DIAGNOSIS — E119 Type 2 diabetes mellitus without complications: Secondary | ICD-10-CM | POA: Diagnosis not present

## 2017-01-18 ENCOUNTER — Telehealth: Payer: Self-pay | Admitting: Cardiology

## 2017-01-18 ENCOUNTER — Ambulatory Visit (INDEPENDENT_AMBULATORY_CARE_PROVIDER_SITE_OTHER): Payer: Medicare Other | Admitting: *Deleted

## 2017-01-18 DIAGNOSIS — I495 Sick sinus syndrome: Secondary | ICD-10-CM

## 2017-01-18 NOTE — Telephone Encounter (Signed)
Spoke with pt and reminded pt of remote transmission that is due today. Pt verbalized understanding.   

## 2017-01-18 NOTE — Progress Notes (Signed)
Remote pacemaker transmission.   

## 2017-01-19 ENCOUNTER — Encounter: Payer: Self-pay | Admitting: Cardiology

## 2017-01-19 LAB — CUP PACEART REMOTE DEVICE CHECK
Battery Impedance: 2426 Ohm
Battery Remaining Longevity: 24 mo
Brady Statistic AP VP Percent: 90 %
Brady Statistic AS VP Percent: 10 %
Brady Statistic AS VS Percent: 0 %
Date Time Interrogation Session: 20180403191750
Implantable Lead Implant Date: 19960418
Implantable Lead Implant Date: 19960418
Implantable Lead Location: 753859
Implantable Pulse Generator Implant Date: 20081211
Lead Channel Impedance Value: 1023 Ohm
Lead Channel Impedance Value: 884 Ohm
Lead Channel Pacing Threshold Amplitude: 1.25 V
Lead Channel Pacing Threshold Pulse Width: 0.4 ms
Lead Channel Setting Pacing Amplitude: 2 V
Lead Channel Setting Sensing Sensitivity: 2.8 mV
MDC IDC LEAD LOCATION: 753860
MDC IDC MSMT BATTERY VOLTAGE: 2.75 V
MDC IDC MSMT LEADCHNL RA PACING THRESHOLD AMPLITUDE: 0.375 V
MDC IDC MSMT LEADCHNL RA PACING THRESHOLD PULSEWIDTH: 0.4 ms
MDC IDC SET LEADCHNL RV PACING AMPLITUDE: 2.75 V
MDC IDC SET LEADCHNL RV PACING PULSEWIDTH: 0.46 ms
MDC IDC STAT BRADY AP VS PERCENT: 1 %

## 2017-02-02 DIAGNOSIS — E1141 Type 2 diabetes mellitus with diabetic mononeuropathy: Secondary | ICD-10-CM | POA: Diagnosis not present

## 2017-02-02 DIAGNOSIS — I10 Essential (primary) hypertension: Secondary | ICD-10-CM | POA: Diagnosis not present

## 2017-02-04 DIAGNOSIS — G589 Mononeuropathy, unspecified: Secondary | ICD-10-CM | POA: Diagnosis not present

## 2017-02-04 DIAGNOSIS — Z6836 Body mass index (BMI) 36.0-36.9, adult: Secondary | ICD-10-CM | POA: Diagnosis not present

## 2017-02-04 DIAGNOSIS — I10 Essential (primary) hypertension: Secondary | ICD-10-CM | POA: Diagnosis not present

## 2017-02-04 DIAGNOSIS — H6123 Impacted cerumen, bilateral: Secondary | ICD-10-CM | POA: Diagnosis not present

## 2017-02-04 DIAGNOSIS — Z950 Presence of cardiac pacemaker: Secondary | ICD-10-CM | POA: Diagnosis not present

## 2017-02-04 DIAGNOSIS — E782 Mixed hyperlipidemia: Secondary | ICD-10-CM | POA: Diagnosis not present

## 2017-02-04 DIAGNOSIS — D696 Thrombocytopenia, unspecified: Secondary | ICD-10-CM | POA: Diagnosis not present

## 2017-02-04 DIAGNOSIS — E1141 Type 2 diabetes mellitus with diabetic mononeuropathy: Secondary | ICD-10-CM | POA: Diagnosis not present

## 2017-03-05 ENCOUNTER — Encounter (HOSPITAL_COMMUNITY): Payer: Self-pay | Admitting: Emergency Medicine

## 2017-03-05 ENCOUNTER — Emergency Department (HOSPITAL_COMMUNITY): Payer: Medicare Other

## 2017-03-05 ENCOUNTER — Inpatient Hospital Stay (HOSPITAL_COMMUNITY)
Admission: EM | Admit: 2017-03-05 | Discharge: 2017-03-08 | DRG: 563 | Disposition: A | Discharge status: Skilled Nursing Facility | Payer: Medicare Other | Attending: Family Medicine | Admitting: Family Medicine

## 2017-03-05 DIAGNOSIS — I110 Hypertensive heart disease with heart failure: Secondary | ICD-10-CM | POA: Diagnosis present

## 2017-03-05 DIAGNOSIS — N179 Acute kidney failure, unspecified: Secondary | ICD-10-CM | POA: Diagnosis not present

## 2017-03-05 DIAGNOSIS — S42309A Unspecified fracture of shaft of humerus, unspecified arm, initial encounter for closed fracture: Secondary | ICD-10-CM | POA: Diagnosis present

## 2017-03-05 DIAGNOSIS — S42202A Unspecified fracture of upper end of left humerus, initial encounter for closed fracture: Secondary | ICD-10-CM

## 2017-03-05 DIAGNOSIS — T07XXXA Unspecified multiple injuries, initial encounter: Secondary | ICD-10-CM

## 2017-03-05 DIAGNOSIS — E782 Mixed hyperlipidemia: Secondary | ICD-10-CM | POA: Diagnosis present

## 2017-03-05 DIAGNOSIS — S0990XA Unspecified injury of head, initial encounter: Secondary | ICD-10-CM | POA: Diagnosis not present

## 2017-03-05 DIAGNOSIS — M75102 Unspecified rotator cuff tear or rupture of left shoulder, not specified as traumatic: Secondary | ICD-10-CM | POA: Diagnosis not present

## 2017-03-05 DIAGNOSIS — S51012A Laceration without foreign body of left elbow, initial encounter: Secondary | ICD-10-CM | POA: Diagnosis present

## 2017-03-05 DIAGNOSIS — I4891 Unspecified atrial fibrillation: Secondary | ICD-10-CM | POA: Diagnosis present

## 2017-03-05 DIAGNOSIS — W19XXXA Unspecified fall, initial encounter: Secondary | ICD-10-CM

## 2017-03-05 DIAGNOSIS — M79603 Pain in arm, unspecified: Secondary | ICD-10-CM | POA: Diagnosis not present

## 2017-03-05 DIAGNOSIS — R7989 Other specified abnormal findings of blood chemistry: Secondary | ICD-10-CM | POA: Diagnosis present

## 2017-03-05 DIAGNOSIS — G4733 Obstructive sleep apnea (adult) (pediatric): Secondary | ICD-10-CM | POA: Diagnosis present

## 2017-03-05 DIAGNOSIS — Z794 Long term (current) use of insulin: Secondary | ICD-10-CM

## 2017-03-05 DIAGNOSIS — I10 Essential (primary) hypertension: Secondary | ICD-10-CM | POA: Diagnosis present

## 2017-03-05 DIAGNOSIS — Z950 Presence of cardiac pacemaker: Secondary | ICD-10-CM | POA: Diagnosis present

## 2017-03-05 DIAGNOSIS — S42302A Unspecified fracture of shaft of humerus, left arm, initial encounter for closed fracture: Secondary | ICD-10-CM | POA: Diagnosis not present

## 2017-03-05 DIAGNOSIS — S42209A Unspecified fracture of upper end of unspecified humerus, initial encounter for closed fracture: Secondary | ICD-10-CM

## 2017-03-05 DIAGNOSIS — S59902A Unspecified injury of left elbow, initial encounter: Secondary | ICD-10-CM | POA: Diagnosis not present

## 2017-03-05 DIAGNOSIS — I5032 Chronic diastolic (congestive) heart failure: Secondary | ICD-10-CM | POA: Diagnosis not present

## 2017-03-05 DIAGNOSIS — E1065 Type 1 diabetes mellitus with hyperglycemia: Secondary | ICD-10-CM | POA: Diagnosis not present

## 2017-03-05 DIAGNOSIS — I517 Cardiomegaly: Secondary | ICD-10-CM | POA: Diagnosis not present

## 2017-03-05 DIAGNOSIS — J449 Chronic obstructive pulmonary disease, unspecified: Secondary | ICD-10-CM | POA: Diagnosis present

## 2017-03-05 DIAGNOSIS — S42212A Unspecified displaced fracture of surgical neck of left humerus, initial encounter for closed fracture: Secondary | ICD-10-CM | POA: Diagnosis not present

## 2017-03-05 DIAGNOSIS — Z9181 History of falling: Secondary | ICD-10-CM | POA: Diagnosis not present

## 2017-03-05 DIAGNOSIS — W109XXA Fall (on) (from) unspecified stairs and steps, initial encounter: Secondary | ICD-10-CM | POA: Diagnosis present

## 2017-03-05 DIAGNOSIS — I272 Pulmonary hypertension, unspecified: Secondary | ICD-10-CM | POA: Diagnosis present

## 2017-03-05 DIAGNOSIS — M25512 Pain in left shoulder: Secondary | ICD-10-CM | POA: Diagnosis not present

## 2017-03-05 DIAGNOSIS — E118 Type 2 diabetes mellitus with unspecified complications: Secondary | ICD-10-CM | POA: Diagnosis present

## 2017-03-05 DIAGNOSIS — E1165 Type 2 diabetes mellitus with hyperglycemia: Secondary | ICD-10-CM | POA: Diagnosis present

## 2017-03-05 DIAGNOSIS — R52 Pain, unspecified: Secondary | ICD-10-CM | POA: Diagnosis not present

## 2017-03-05 DIAGNOSIS — E669 Obesity, unspecified: Secondary | ICD-10-CM | POA: Diagnosis present

## 2017-03-05 DIAGNOSIS — E861 Hypovolemia: Secondary | ICD-10-CM | POA: Diagnosis present

## 2017-03-05 DIAGNOSIS — Z87891 Personal history of nicotine dependence: Secondary | ICD-10-CM

## 2017-03-05 DIAGNOSIS — E871 Hypo-osmolality and hyponatremia: Secondary | ICD-10-CM | POA: Diagnosis not present

## 2017-03-05 DIAGNOSIS — I495 Sick sinus syndrome: Secondary | ICD-10-CM | POA: Diagnosis present

## 2017-03-05 DIAGNOSIS — Z6835 Body mass index (BMI) 35.0-35.9, adult: Secondary | ICD-10-CM

## 2017-03-05 DIAGNOSIS — M75101 Unspecified rotator cuff tear or rupture of right shoulder, not specified as traumatic: Secondary | ICD-10-CM | POA: Diagnosis not present

## 2017-03-05 DIAGNOSIS — I5033 Acute on chronic diastolic (congestive) heart failure: Secondary | ICD-10-CM | POA: Diagnosis present

## 2017-03-05 DIAGNOSIS — S40212A Abrasion of left shoulder, initial encounter: Secondary | ICD-10-CM | POA: Diagnosis not present

## 2017-03-05 DIAGNOSIS — S6992XA Unspecified injury of left wrist, hand and finger(s), initial encounter: Secondary | ICD-10-CM | POA: Diagnosis not present

## 2017-03-05 DIAGNOSIS — S40811A Abrasion of right upper arm, initial encounter: Secondary | ICD-10-CM | POA: Diagnosis not present

## 2017-03-05 DIAGNOSIS — R9431 Abnormal electrocardiogram [ECG] [EKG]: Secondary | ICD-10-CM | POA: Diagnosis not present

## 2017-03-05 DIAGNOSIS — Y92008 Other place in unspecified non-institutional (private) residence as the place of occurrence of the external cause: Secondary | ICD-10-CM

## 2017-03-05 LAB — CBC WITH DIFFERENTIAL/PLATELET
BASOS ABS: 0 10*3/uL (ref 0.0–0.1)
Basophils Relative: 0 %
EOS ABS: 0 10*3/uL (ref 0.0–0.7)
EOS PCT: 0 %
HCT: 41.2 % (ref 39.0–52.0)
Hemoglobin: 13.8 g/dL (ref 13.0–17.0)
LYMPHS ABS: 1.1 10*3/uL (ref 0.7–4.0)
Lymphocytes Relative: 9 %
MCH: 33.2 pg (ref 26.0–34.0)
MCHC: 33.5 g/dL (ref 30.0–36.0)
MCV: 99 fL (ref 78.0–100.0)
Monocytes Absolute: 1.6 10*3/uL — ABNORMAL HIGH (ref 0.1–1.0)
Monocytes Relative: 12 %
NEUTROS PCT: 79 %
Neutro Abs: 10.6 10*3/uL — ABNORMAL HIGH (ref 1.7–7.7)
PLATELETS: 146 10*3/uL — AB (ref 150–400)
RBC: 4.16 MIL/uL — AB (ref 4.22–5.81)
RDW: 14.8 % (ref 11.5–15.5)
WBC: 13.4 10*3/uL — AB (ref 4.0–10.5)

## 2017-03-05 LAB — BASIC METABOLIC PANEL
Anion gap: 10 (ref 5–15)
BUN: 26 mg/dL — ABNORMAL HIGH (ref 6–20)
CHLORIDE: 98 mmol/L — AB (ref 101–111)
CO2: 26 mmol/L (ref 22–32)
CREATININE: 1.18 mg/dL (ref 0.61–1.24)
Calcium: 8.9 mg/dL (ref 8.9–10.3)
GFR, EST AFRICAN AMERICAN: 59 mL/min — AB (ref >60)
GFR, EST NON AFRICAN AMERICAN: 51 mL/min — AB (ref >60)
Glucose, Bld: 206 mg/dL — ABNORMAL HIGH (ref 65–99)
Potassium: 5.1 mmol/L (ref 3.5–5.1)
SODIUM: 134 mmol/L — AB (ref 135–145)

## 2017-03-05 LAB — CK: CK TOTAL: 760 U/L — AB (ref 49–397)

## 2017-03-05 MED ORDER — SODIUM CHLORIDE 0.9 % IV BOLUS (SEPSIS)
500.0000 mL | Freq: Once | INTRAVENOUS | Status: AC
  Administered 2017-03-05: 500 mL via INTRAVENOUS

## 2017-03-05 NOTE — ED Triage Notes (Signed)
Pt fell today at 1300 in his carport onto L shoulder. Denies LOC or hitting head. Son found pt in carport approx 8 hours later. L shoulder pain and multiple skin tears to bilateral arms. Pt A&O X4. Pt is able to move fingers on L arm, arm is in splint applied by EMS.

## 2017-03-05 NOTE — ED Notes (Signed)
Ice applied to left shoulder

## 2017-03-05 NOTE — ED Notes (Signed)
Pt has skin tears noted to R elbow, R forearm, between second and third digit on R hand, R shin, L palmar surface of hand, and L elbow. Multiple areas of bruising noted to bilateral arms, L shoulder.

## 2017-03-05 NOTE — ED Provider Notes (Signed)
Emergency Department Provider Note  By signing my name below, I, Levon Hedger, attest that this documentation has been prepared under the direction and in the presence of Nadege Carriger, Arlyss Repress, MD . Electronically Signed: Levon Hedger, Scribe. 03/05/2017. 10:15 PM.   I have reviewed the triage vital signs and the nursing notes.   HISTORY  Chief Complaint Fall   HPI Alan Briggs is a 83 y.o. male who presents to the Emergency Department complaining of sudden onset, constant, severe  left shoulder pain s/p unwitnessed fall today at 1 pm. Pt walking up the stairs in his carport when he lost his balance and fell backwards down four steps, landing on the concrete. Per pt, he was unable to get up due to the position he landed in. Pt was lying in his carport until his son came to check on him about 8 hours later. Associated skin tears to his bilateral arms. No OTC treatments tried for these symptoms PTA. Pt lives at home, and is ambulatory with a walker at baseline. He denies any LOC or head injury. He denies any lightheadedness, CP, SOB, back pain.    Past Medical History:  Diagnosis Date  . Cardiac pacemaker in situ    for sick sinus syndrome-last placement was 09/2007 [facility]  . Diabetes [facility])   . GERD (gastroesophageal reflux disease)   . Mixed hyperlipidemia   . Obstructive lung disease (HCC)    non compliant with home O2  . Pulmonary hypertension (HCC)   . Sleep apnea   . Thrombocytopenia (HCC)   . Venous insufficiency (chronic) (peripheral)     Patient Active Problem List   Diagnosis Date Noted  . Ileus (HCC) 04/30/2016  . Acute renal failure (HCC)   . Pancreatic mass   . Sepsis, unspecified organism (HCC)   . Chronic obstructive pulmonary disease (HCC)   . OSA (obstructive sleep apnea)   . Controlled diabetes mellitus type 2 with complications (HCC)   . Sick sinus syndrome (HCC)   . Presence of permanent cardiac pacemaker   . Foot laceration   . Dislocation of  fifth toe, right, closed   . Abdominal pain 04/17/2016  . UTI (lower urinary tract infection) 04/17/2016  . Pacemaker 09/30/2015  . Chronic diastolic heart failure (HCC) 09/30/2015  . Venous insufficiency 09/30/2015    Past Surgical History:  Procedure Laterality Date  . CATARACT EXTRACTION, BILATERAL Bilateral   . KIDNEY STONE SURGERY Left    laser ablation   . PERCUTANEOUS PINNING Right 04/20/2016   Procedure: IRRIGATION AND DEBRIDEMENT RIGHT FOOT, PERCUTANEOUS PINNING SMALL TOE;  Surgeon: Tarry Kos, MD;  Location: [facility] OR;  Service: Orthopedics;  Laterality: Right;  . REFRACTIVE SURGERY Right   . ROTATOR CUFF REPAIR Left     Current Outpatient Rx  . Order #: [id number] Class: Historical Med  . Order #: [id number] Class: Historical Med  . Order #: [id number] Class: Historical Med  . Order #: [id number] Class: Historical Med  . Order #: [id number] Class: Historical Med  . Order #: [id number] Class: Historical Med  . Order #: [id number] Class: Historical Med  . Order #: [id number] Class: Historical Med    Allergies Advair diskus [fluticasone-salmeterol]; Combivent [ipratropium-albuterol]; Lotensin hct [benazepril-hydrochlorothiazide]; Penicillins; and Simvastatin  Family History  Problem Relation Age of Onset  . Diabetes Mother     Social History Social History  Substance Use Topics  . Smoking status: Former Smoker    Packs/day: 0.50    Years: 30.00    Types: Cigarettes    Start  date: 10/18/1948    Quit date: 10/18/1978  . Smokeless tobacco: Never Used  . Alcohol use No    Review of Systems  Constitutional: No fever/chills Eyes: No visual changes. ENT: No sore throat. Cardiovascular: Denies chest pain. Respiratory: Denies shortness of breath. Gastrointestinal: No abdominal pain.  No nausea, no vomiting.  No diarrhea.  No constipation. Genitourinary: Negative for dysuria. Musculoskeletal: Negative for back pain. Positive myalgias. Skin: Negative for rash. Positive  wounds. Neurological: Negative for headaches, syncope, focal weakness or numbness.  10-point ROS otherwise negative.  ____________________________________________   PHYSICAL EXAM:  VITAL SIGNS: Vitals:   03/07/17 2209 03/08/17 0508  BP: 116/62 (!) 167/64  Pulse: 66 71  Resp: 18 18  Temp: 98.4 F (36.9 C) 98.4 F (36.9 C)    Constitutional: Alert and oriented. Well appearing and in no acute distress. Eyes: Conjunctivae are normal. PERRL. EOMI. Head: Atraumatic. Nose: No congestion/rhinnorhea. Mouth/Throat: Mucous membranes are moist.   Neck: No stridor. No cervical spine tenderness to palpation. Cardiovascular: Normal rate, regular rhythm. Good peripheral circulation. Grossly normal heart sounds.   Respiratory: Normal respiratory effort.  No retractions. Lungs CTAB. Gastrointestinal: Soft and nontender. No distention.  Musculoskeletal: No lower extremity tenderness nor edema. No gross deformities of extremities. Positive LUE pain worse with any movement. Neurologic:  Normal speech and language. No gross focal neurologic deficits are appreciated.  Skin:  Skin is warm, dry and intact. Multiple skin tares in the bilateral upper extremities. .   ____________________________________________   LABS (all labs ordered are listed, but only abnormal results are displayed)  Labs Reviewed  BASIC METABOLIC PANEL - Abnormal; Notable for the following:       Result Value   Sodium 134 (*)    Chloride 98 (*)    Glucose, Bld 206 (*)    BUN 26 (*)    GFR calc non Af Amer 51 (*)    GFR calc Af Amer 59 (*)    All other components within normal limits  CBC WITH DIFFERENTIAL/PLATELET - Abnormal; Notable for the following:    WBC 13.4 (*)    RBC 4.16 (*)    Platelets 146 (*)    Neutro Abs 10.6 (*)    Monocytes Absolute 1.6 (*)    All other components within normal limits  CK - Abnormal; Notable for the following:    Total CK 760 (*)    All other components within normal limits    ____________________________________________  EKG   EKG Interpretation  Date/Time:  Saturday Mar 05 2017 22:02:40 EDT Ventricular Rate:  82 PR Interval:    QRS Duration: 126 QT Interval:  410 QTC Calculation: 555 R Axis:   -88 Text Interpretation:  A-V dual-paced complexes, A-noncapt due to AF No further analysis attempted due to paced rhythm Confirmed by Alona Bene [phone]) on 03/05/2017 10:28:54 PM       ____________________________________________  RADIOLOGY  Dg Chest 1 View  Result Date: 03/06/2017 CLINICAL DATA:  Fall today in car port. Left upper extremity pain most prominent at the shoulder with multiple skin tears. EXAM: CHEST 1 VIEW COMPARISON:  Radiographs 04/20/2016 FINDINGS: Left-sided pacemaker in place, leads projecting over the right atrium and ventricle.The cardiomediastinal contours are unchanged with stable cardiomegaly. Pulmonary vasculature is normal. No consolidation, pleural effusion, or pneumothorax. No acute osseous abnormalities are seen. Left proximal humerus fracture not well visualized. IMPRESSION: Stable cardiomegaly.  No evidence of acute or traumatic abnormality. Electronically Signed   By: Lujean Rave.D.  On: 03/06/2017 00:32   Dg Elbow Complete Left  Result Date: 03/06/2017 CLINICAL DATA:  Fall today in car port. Left upper extremity pain most prominent at the shoulder with multiple skin tears. EXAM: LEFT ELBOW - COMPLETE 3+ VIEW COMPARISON:  None. FINDINGS: There is no evidence of fracture, dislocation, or joint effusion. Soft tissue air in the medial soft tissues. No radiopaque foreign body. IMPRESSION: 1. No fracture or subluxation. 2. Soft tissue air in the medial soft tissues suggests laceration. No radiopaque foreign body. Electronically Signed   By: Rubye OaksMelanie  Ehinger M.D.   On: 03/06/2017 00:30   Dg Wrist Complete Left  Result Date: 03/06/2017 CLINICAL DATA:  Fall today in car port. Left upper extremity pain most prominent at the  shoulder with multiple skin tears. EXAM: LEFT WRIST - COMPLETE 3+ VIEW COMPARISON:  None. FINDINGS: There is no evidence of fracture or dislocation. Moderate radiocarpal osteoarthritis. Advanced first carpometacarpal osteoarthritis. Scaphoid is intact. Mild soft tissue edema. IMPRESSION: 1. No fracture or dislocation. 2. Osteoarthritis, prominent at the base of the thumb. Electronically Signed   By: Rubye OaksMelanie  Ehinger M.D.   On: 03/06/2017 00:31   Ct Head Wo Contrast  Result Date: 03/06/2017 CLINICAL DATA:  Fall today with head injury. EXAM: CT HEAD WITHOUT CONTRAST TECHNIQUE: Contiguous axial images were obtained from the base of the skull through the vertex without intravenous contrast. COMPARISON:  None. FINDINGS: Brain: Generalized cerebral and cerebellar atrophy. Remote lacunar infarct in the left caudate. Mild for age chronic small vessel ischemic change. No intracranial hemorrhage, subdural or extra-axial fluid collection. No mass effect or midline shift. Vascular: Atherosclerosis of skullbase vasculature without hyperdense vessel or abnormal calcification. Skull: No skull fracture.  No focal lesion. Sinuses/Orbits: Paranasal sinuses and mastoid air cells are clear. The visualized orbits are unremarkable. Bilateral cataract resection. Other: None. IMPRESSION: 1.  No acute intracranial abnormality. 2. Atrophy and chronic small vessel ischemia. Remote lacunar infarct in the left caudate. Electronically Signed   By: Rubye OaksMelanie  Ehinger M.D.   On: 03/06/2017 00:11   Dg Shoulder Left  Result Date: 03/06/2017 CLINICAL DATA:  Fall today in car port. Left upper extremity pain most prominent at the shoulder with multiple skin tears. EXAM: LEFT SHOULDER - 2+ VIEW COMPARISON:  None. FINDINGS: Comminuted displaced fracture of the humeral head and neck. Humeral shaft displaced medially. Dominant fracture line just inferior to the glenohumeral joint. There is comminuted left displaced involvement of the lateral head.  Acromioclavicular joint is congruent. IMPRESSION: Comminuted displaced fracture of the humeral head/neck. Electronically Signed   By: Rubye OaksMelanie  Ehinger M.D.   On: 03/06/2017 00:29   Dg Humerus Left  Result Date: 03/06/2017 CLINICAL DATA:  Fall today in car port. Left upper extremity pain most prominent at the shoulder with multiple skin tears. EXAM: LEFT HUMERUS - 2+ VIEW COMPARISON:  None. FINDINGS: Comminuted fracture of the humeral neck and lateral humeral head. There is displacement of the dominant humeral shaft fragment 2.5 cm medially. Fracture line appears just inferior to the glenohumeral joint. The distal humerus is intact. Soft tissue edema noted about the elbow. IMPRESSION: Comminuted displaced humeral neck and lateral head fracture. Fracture appears just inferior to the glenohumeral joint. Electronically Signed   By: Rubye OaksMelanie  Ehinger M.D.   On: 03/06/2017 00:28    ____________________________________________   PROCEDURES  Procedure(s) performed:   Procedures  None ____________________________________________   INITIAL IMPRESSION / ASSESSMENT AND PLAN / ED COURSE  Pertinent labs & imaging results that were available during my  care of the patient were reviewed by me and considered in my medical decision making (see chart for details).  Patient resents to the emergency department for evaluation of mechanical fall down the steps at his home at approximately 1 PM. He has primarily left shoulder and arm pain after falling. He has multiple areas of skin tear but no injuries amenable to suture. This patient's pain is in the left shoulder. He was also unable to get to his feet and spent approximately 8 hours on the ground until help arrived. Patient has full range of motion of his hips, knees, ankles. He has a skin tear to the right arm with full range of motion of all joints and no tenderness. No cervical spine tenderness. He does have a faint abrasion on the forehead so plan for CT of  the head along with imaging. Plan to obtain lab work and CK given the prolonged downtime. Patient lives at home alone and uses a walker for assistance. No family at bedside during my initial exam.   Care transferred to Dr. Manus Gunning who will follow imaging results and lab work.  ____________________________________________  FINAL CLINICAL IMPRESSION(S) / ED DIAGNOSES  Final diagnoses:  Fall     MEDICATIONS GIVEN DURING THIS VISIT:  Medications  sodium chloride 0.9 % bolus 500 mL (0 mLs Intravenous Stopped 03/06/17 0028)     NEW OUTPATIENT MEDICATIONS STARTED DURING THIS VISIT:  None  I personally performed the services described in this documentation, which was scribed in my presence. The recorded information has been reviewed and is accurate.     Note:  This document was prepared using Dragon voice recognition software and may include unintentional dictation errors.  Alona Bene, MD Emergency Medicine    Mirabel Ahlgren, Arlyss Repress, MD 03/21/17 1048

## 2017-03-06 ENCOUNTER — Observation Stay (HOSPITAL_COMMUNITY): Payer: Medicare Other

## 2017-03-06 ENCOUNTER — Encounter (HOSPITAL_COMMUNITY): Payer: Self-pay | Admitting: Family Medicine

## 2017-03-06 DIAGNOSIS — Z9181 History of falling: Secondary | ICD-10-CM | POA: Diagnosis not present

## 2017-03-06 DIAGNOSIS — I5032 Chronic diastolic (congestive) heart failure: Secondary | ICD-10-CM | POA: Diagnosis not present

## 2017-03-06 DIAGNOSIS — I10 Essential (primary) hypertension: Secondary | ICD-10-CM | POA: Diagnosis not present

## 2017-03-06 DIAGNOSIS — N179 Acute kidney failure, unspecified: Secondary | ICD-10-CM

## 2017-03-06 DIAGNOSIS — E669 Obesity, unspecified: Secondary | ICD-10-CM | POA: Diagnosis present

## 2017-03-06 DIAGNOSIS — S42202A Unspecified fracture of upper end of left humerus, initial encounter for closed fracture: Secondary | ICD-10-CM | POA: Diagnosis not present

## 2017-03-06 DIAGNOSIS — R7989 Other specified abnormal findings of blood chemistry: Secondary | ICD-10-CM | POA: Diagnosis present

## 2017-03-06 DIAGNOSIS — E782 Mixed hyperlipidemia: Secondary | ICD-10-CM | POA: Diagnosis present

## 2017-03-06 DIAGNOSIS — J449 Chronic obstructive pulmonary disease, unspecified: Secondary | ICD-10-CM | POA: Diagnosis present

## 2017-03-06 DIAGNOSIS — S42212A Unspecified displaced fracture of surgical neck of left humerus, initial encounter for closed fracture: Secondary | ICD-10-CM | POA: Diagnosis present

## 2017-03-06 DIAGNOSIS — Z794 Long term (current) use of insulin: Secondary | ICD-10-CM | POA: Diagnosis not present

## 2017-03-06 DIAGNOSIS — S42292A Other displaced fracture of upper end of left humerus, initial encounter for closed fracture: Secondary | ICD-10-CM

## 2017-03-06 DIAGNOSIS — E861 Hypovolemia: Secondary | ICD-10-CM | POA: Diagnosis present

## 2017-03-06 DIAGNOSIS — J42 Unspecified chronic bronchitis: Secondary | ICD-10-CM

## 2017-03-06 DIAGNOSIS — S42309A Unspecified fracture of shaft of humerus, unspecified arm, initial encounter for closed fracture: Secondary | ICD-10-CM | POA: Diagnosis present

## 2017-03-06 DIAGNOSIS — W19XXXA Unspecified fall, initial encounter: Secondary | ICD-10-CM | POA: Diagnosis not present

## 2017-03-06 DIAGNOSIS — E118 Type 2 diabetes mellitus with unspecified complications: Secondary | ICD-10-CM | POA: Diagnosis not present

## 2017-03-06 DIAGNOSIS — Z87891 Personal history of nicotine dependence: Secondary | ICD-10-CM | POA: Diagnosis not present

## 2017-03-06 DIAGNOSIS — E1165 Type 2 diabetes mellitus with hyperglycemia: Secondary | ICD-10-CM | POA: Diagnosis present

## 2017-03-06 DIAGNOSIS — E871 Hypo-osmolality and hyponatremia: Secondary | ICD-10-CM | POA: Diagnosis present

## 2017-03-06 DIAGNOSIS — I495 Sick sinus syndrome: Secondary | ICD-10-CM | POA: Diagnosis present

## 2017-03-06 DIAGNOSIS — I272 Pulmonary hypertension, unspecified: Secondary | ICD-10-CM | POA: Diagnosis present

## 2017-03-06 DIAGNOSIS — I110 Hypertensive heart disease with heart failure: Secondary | ICD-10-CM | POA: Diagnosis present

## 2017-03-06 DIAGNOSIS — Z950 Presence of cardiac pacemaker: Secondary | ICD-10-CM | POA: Diagnosis not present

## 2017-03-06 DIAGNOSIS — Z6835 Body mass index (BMI) 35.0-35.9, adult: Secondary | ICD-10-CM | POA: Diagnosis not present

## 2017-03-06 DIAGNOSIS — Y92008 Other place in unspecified non-institutional (private) residence as the place of occurrence of the external cause: Secondary | ICD-10-CM | POA: Diagnosis not present

## 2017-03-06 DIAGNOSIS — G4733 Obstructive sleep apnea (adult) (pediatric): Secondary | ICD-10-CM | POA: Diagnosis present

## 2017-03-06 DIAGNOSIS — W109XXA Fall (on) (from) unspecified stairs and steps, initial encounter: Secondary | ICD-10-CM | POA: Diagnosis present

## 2017-03-06 DIAGNOSIS — T07XXXA Unspecified multiple injuries, initial encounter: Secondary | ICD-10-CM | POA: Diagnosis not present

## 2017-03-06 DIAGNOSIS — S51012A Laceration without foreign body of left elbow, initial encounter: Secondary | ICD-10-CM | POA: Diagnosis not present

## 2017-03-06 DIAGNOSIS — I4891 Unspecified atrial fibrillation: Secondary | ICD-10-CM | POA: Diagnosis present

## 2017-03-06 LAB — BASIC METABOLIC PANEL
ANION GAP: 8 (ref 5–15)
BUN: 31 mg/dL — ABNORMAL HIGH (ref 6–20)
CHLORIDE: 101 mmol/L (ref 101–111)
CO2: 27 mmol/L (ref 22–32)
Calcium: 8.4 mg/dL — ABNORMAL LOW (ref 8.9–10.3)
Creatinine, Ser: 1.09 mg/dL (ref 0.61–1.24)
GFR calc non Af Amer: 56 mL/min — ABNORMAL LOW (ref >60)
GLUCOSE: 194 mg/dL — AB (ref 65–99)
POTASSIUM: 5 mmol/L (ref 3.5–5.1)
Sodium: 136 mmol/L (ref 135–145)

## 2017-03-06 LAB — CBC
HEMATOCRIT: 36.7 % — AB (ref 39.0–52.0)
HEMOGLOBIN: 12.4 g/dL — AB (ref 13.0–17.0)
MCH: 33.4 pg (ref 26.0–34.0)
MCHC: 33.8 g/dL (ref 30.0–36.0)
MCV: 98.9 fL (ref 78.0–100.0)
PLATELETS: 138 10*3/uL — AB (ref 150–400)
RBC: 3.71 MIL/uL — AB (ref 4.22–5.81)
RDW: 14.9 % (ref 11.5–15.5)
WBC: 10.2 10*3/uL (ref 4.0–10.5)

## 2017-03-06 LAB — GLUCOSE, CAPILLARY
GLUCOSE-CAPILLARY: 165 mg/dL — AB (ref 65–99)
GLUCOSE-CAPILLARY: 184 mg/dL — AB (ref 65–99)
Glucose-Capillary: 176 mg/dL — ABNORMAL HIGH (ref 65–99)
Glucose-Capillary: 190 mg/dL — ABNORMAL HIGH (ref 65–99)

## 2017-03-06 LAB — CK: Total CK: 719 U/L — ABNORMAL HIGH (ref 49–397)

## 2017-03-06 MED ORDER — ONDANSETRON HCL 4 MG PO TABS: 4.0000 mg | ORAL_TABLET | Freq: Four times a day (QID) | ORAL | Status: DC | PRN

## 2017-03-06 MED ORDER — POVIDONE-IODINE 10 % EX SOLN
CUTANEOUS | Status: AC
  Filled 2017-03-06: qty 118

## 2017-03-06 MED ORDER — INSULIN GLARGINE 100 UNIT/ML ~~LOC~~ SOLN
25.0000 [IU] | Freq: Every day | SUBCUTANEOUS | Status: DC
  Administered 2017-03-06 – 2017-03-07 (×2): 25 [IU] via SUBCUTANEOUS
  Filled 2017-03-06 (×4): qty 0.25

## 2017-03-06 MED ORDER — POVIDONE-IODINE 10 % EX OINT
TOPICAL_OINTMENT | Freq: Once | CUTANEOUS | Status: AC
  Administered 2017-03-06: 02:00:00 via TOPICAL
  Filled 2017-03-06: qty 28.35

## 2017-03-06 MED ORDER — SODIUM CHLORIDE 0.9 % IV SOLN: INTRAVENOUS | Status: DC

## 2017-03-06 MED ORDER — GLUCERNA PO LIQD: 237.0000 mL | Freq: Every day | ORAL | Status: DC

## 2017-03-06 MED ORDER — ENOXAPARIN SODIUM 40 MG/0.4ML ~~LOC~~ SOLN
40.0000 mg | SUBCUTANEOUS | Status: DC
  Administered 2017-03-06 – 2017-03-08 (×3): 40 mg via SUBCUTANEOUS
  Filled 2017-03-06 (×3): qty 0.4

## 2017-03-06 MED ORDER — INSULIN ASPART 100 UNIT/ML ~~LOC~~ SOLN: 0.0000 [IU] | Freq: Three times a day (TID) | SUBCUTANEOUS | Status: DC

## 2017-03-06 MED ORDER — ACETAMINOPHEN 500 MG PO TABS
1000.0000 mg | ORAL_TABLET | Freq: Three times a day (TID) | ORAL | Status: DC
  Administered 2017-03-06 – 2017-03-08 (×8): 1000 mg via ORAL
  Filled 2017-03-06 (×8): qty 2

## 2017-03-06 MED ORDER — INSULIN GLARGINE 100 UNIT/ML ~~LOC~~ SOLN
33.0000 [IU] | Freq: Every day | SUBCUTANEOUS | Status: DC
Start: 2017-03-06 — End: 2017-03-06

## 2017-03-06 MED ORDER — LIDOCAINE-EPINEPHRINE (PF) 1 %-1:200000 IJ SOLN
10.0000 mL | Freq: Once | INTRAMUSCULAR | Status: AC
  Administered 2017-03-06: 10 mL via INTRADERMAL
  Filled 2017-03-06: qty 30

## 2017-03-06 MED ORDER — OXYCODONE HCL 5 MG PO TABS
2.5000 mg | ORAL_TABLET | ORAL | Status: DC | PRN
  Administered 2017-03-06 – 2017-03-08 (×11): 5 mg via ORAL
  Filled 2017-03-06 (×11): qty 1

## 2017-03-06 MED ORDER — FENTANYL CITRATE (PF) 100 MCG/2ML IJ SOLN
25.0000 ug | Freq: Once | INTRAMUSCULAR | Status: AC
  Administered 2017-03-06: 25 ug via INTRAVENOUS
  Filled 2017-03-06: qty 2

## 2017-03-06 MED ORDER — SODIUM CHLORIDE 0.9 % IV SOLN
INTRAVENOUS | Status: AC
  Administered 2017-03-06: 21:00:00 via INTRAVENOUS

## 2017-03-06 MED ORDER — ASPIRIN EC 81 MG PO TBEC
81.0000 mg | DELAYED_RELEASE_TABLET | Freq: Every day | ORAL | Status: DC
Start: 2017-03-06 — End: 2017-03-08
  Administered 2017-03-06 – 2017-03-08 (×3): 81 mg via ORAL
  Filled 2017-03-06 (×3): qty 1

## 2017-03-06 MED ORDER — SODIUM CHLORIDE 0.9 % IV SOLN
INTRAVENOUS | Status: AC
  Administered 2017-03-06 (×2): via INTRAVENOUS

## 2017-03-06 MED ORDER — INSULIN ASPART 100 UNIT/ML ~~LOC~~ SOLN: 0.0000 [IU] | Freq: Every day | SUBCUTANEOUS | Status: DC

## 2017-03-06 MED ORDER — FENTANYL CITRATE (PF) 100 MCG/2ML IJ SOLN
25.0000 ug | Freq: Once | INTRAMUSCULAR | Status: AC
Start: 2017-03-06 — End: 2017-03-06
  Administered 2017-03-06: 25 ug via INTRAVENOUS
  Filled 2017-03-06: qty 2

## 2017-03-06 MED ORDER — GLUCERNA SHAKE PO LIQD
237.0000 mL | Freq: Every day | ORAL | Status: DC
  Administered 2017-03-06 – 2017-03-08 (×3): 237 mL via ORAL

## 2017-03-06 MED ORDER — POLYETHYLENE GLYCOL 3350 17 G PO PACK: 17.0000 g | PACK | Freq: Every day | ORAL | Status: DC | PRN

## 2017-03-06 MED ORDER — ATENOLOL 25 MG PO TABS
25.0000 mg | ORAL_TABLET | Freq: Every day | ORAL | Status: DC
  Administered 2017-03-06 – 2017-03-08 (×3): 25 mg via ORAL
  Filled 2017-03-06 (×3): qty 1

## 2017-03-06 MED ORDER — ONDANSETRON HCL 4 MG/2ML IJ SOLN: 4.0000 mg | Freq: Four times a day (QID) | INTRAMUSCULAR | Status: DC | PRN

## 2017-03-06 MED ORDER — FENTANYL CITRATE (PF) 100 MCG/2ML IJ SOLN
25.0000 ug | Freq: Once | INTRAMUSCULAR | Status: AC
  Administered 2017-03-06: 25 ug via INTRAVENOUS

## 2017-03-06 NOTE — Progress Notes (Signed)
Patient ID: Cora CollumRobert C Gazda, male   DOB: 1922-10-09, 83 y.o.   MRN: [id number] LEFT PROXIMAL HUM FRACTURE  BP 109/79 (BP Location: Left Leg)   Pulse 65   Temp 98.4 F (36.9 C) (Oral)   Resp 18   Ht 6' (1.829 m)   Wt 263 lb 7.2 oz (119.5 kg)   SpO2 98%   BMI 35.73 kg/m   Past Medical History:  Diagnosis Date  . Cardiac pacemaker in situ    for sick sinus syndrome-[facility]  . Diabetes [facility](HCC)   . GERD (gastroesophageal reflux disease)   . Mixed hyperlipidemia   . Obstructive lung disease (HCC)    non compliant with home O2  . Pulmonary hypertension (HCC)   . Sleep apnea   . Thrombocytopenia (HCC)   . Venous insufficiency (chronic) (peripheral)     THE LEFT SHOULDER FRACTURE IS DISPLACED AND DISTRACTED; APPARENTLY HE IS AMBULATORY WITH A WALKER SO THIS IS NOW A WB LIMB.  WHILE SURGICAL FIXATION WOULD BE IDEAL, HIS MEDICAL AND FRACTURE CONFIGURATION WILL REQUIRE HIGHER LEVEL OF CARE FOR FIXATION IF THAT IS THE CHOSEN METHOD OF TREATMENT   THE SURGERY DOES NOT NEED TO BE DONE ACUTELY  I REC OLAN FOR PLACEMENT AND CONSULT WITH SHOULDER SPECIALIST NEXT WEEK

## 2017-03-06 NOTE — ED Provider Notes (Signed)
Care assumed from Dr. Jacqulyn Bathlong. Patient presents with left shoulder pain after falling around 1 PM and landing on the ground for approximately 8 hours. Complains of left shoulder pain only. X-rays and CT head pending.  X-ray remarkable for displaced humeral head and neck fracture. Discussed with Dr. Romeo AppleHarrison who feels this patient is not a surgical candidate. He recommends admission to [facility]. Shoulder immobilizer placed.  Patient will benefit from admission given his complex fracture, his living alone home situation, and his normal use of walker. D/w Dr. Maryfrances Bunnellanford.  LACERATION REPAIR Performed by: Glynn OctaveANCOUR, Alec Jaros Authorized by: Glynn OctaveANCOUR, Calianna Kim Consent: Verbal consent obtained. Risks and benefits: risks, benefits and alternatives were discussed Consent given by: patient Patient identity confirmed: provided demographic data Prepped and Draped in normal sterile fashion Wound explored  Laceration Location: L elbow  Laceration Length: 2 cm  No Foreign Bodies seen or palpated  Anesthesia: local infiltration  Local anesthetic: lidocaine 1% with epinephrine  Anesthetic total: 3 ml  Irrigation method: syringe Amount of cleaning: standard  Skin closure: 4-0 vicryl  Number of sutures: 3  Technique: simple interrupted.  Patient tolerance: Patient tolerated the procedure well with no immediate complications.    Glynn Octaveancour, Lorriane Dehart, MD 03/06/17 [phone]

## 2017-03-06 NOTE — H&P (Signed)
History and Physical  Patient Name: Alan Briggs     ZOX:[id number]    DOB: 1922-08-03    DOA: 03/05/2017 PCP: Benita Stabile, MD   Patient coming from: Home  Chief Complaint: Fall, left arm pain  HPI: Alan Briggs is a 83 y.o. male with a past medical history significant for IDDM, SSS with pacer, hx of Afib not on anticoagulation due to recurrent falls, and OSA not on CPAP who presents with fall and arm fracture.  The patient lives alone, still drives, and manages all of his own affairs. He was in his usual state of health until this afternoon around 1 PM, he was coming back to the house from running some errands, was walking up the stairs in his carport, when he lost his balance, fell backwards down a few stairs landing on concrete on his left side. He had immediate pain in his left arm and could not get up. He laid there on the floor for approximately 8 hours until his son drove up from near [place] and found him and called EMS.  He had no prodrome, dizziness, lightheadedness, LOC.  He had no chest pain, palpitations.  He just lost his balance.  ED course: -Afebrile, heart rate 74, respiration pulse is normal, blood pressure 102/73 -Na 134, K 5.1, Cr 1.18 (baseline 0.7), WBC 13.4K, Hgb 13.8 -CK 760 -CT head unremarkable -Radiograph of the left shoulder showed a comminuted displaced humeral neck fracture of the left -Elbow and wrist fractures were remarkable only for slight air near the laceration at his elbow -His elbow laceration was sutured. -The case was discussed with Dr. Romeo Apple of orthopedics who recommended nonoperative management for now -The patient is normally dependent on a walker or cane, and was unable to bear weight on his left arm and so TRH was asked to evaluate for admission     ROS: Review of Systems  Constitutional: Negative for chills and fever.  Respiratory: Negative for shortness of breath.   Cardiovascular: Negative for chest pain and palpitations.    Neurological: Negative for dizziness and loss of consciousness.  All other systems reviewed and are negative.         Past Medical History:  Diagnosis Date  . Cardiac pacemaker in situ    for sick sinus syndrome-last placement was 09/2007 [facility]  . Diabetes [facility])   . GERD (gastroesophageal reflux disease)   . Mixed hyperlipidemia   . Obstructive lung disease (HCC)    non compliant with home O2  . Pulmonary hypertension (HCC)   . Sleep apnea   . Thrombocytopenia (HCC)   . Venous insufficiency (chronic) (peripheral)     Past Surgical History:  Procedure Laterality Date  . CATARACT EXTRACTION, BILATERAL Bilateral   . KIDNEY STONE SURGERY Left    laser ablation   . PERCUTANEOUS PINNING Right 04/20/2016   Procedure: IRRIGATION AND DEBRIDEMENT RIGHT FOOT, PERCUTANEOUS PINNING SMALL TOE;  Surgeon: Tarry Kos, MD;  Location: [facility] OR;  Service: Orthopedics;  Laterality: Right;  . REFRACTIVE SURGERY Right   . ROTATOR CUFF REPAIR Left     Social History: Patient lives alone.  The patient walks with a cane out of the house, depends on a walker in the house.  Remote former smoker.  From [place] originally.  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    Allergies  Allergen Reactions  . Advair Diskus [Fluticasone-Salmeterol] Other (See Comments)    Reaction:  Unknown   .  Combivent [Ipratropium-Albuterol] Other (See Comments)    Reaction:  Unknown   . Lotensin Hct [Benazepril-Hydrochlorothiazide] Other (See Comments)    Reaction:  Unknown   . Penicillins Swelling and Other (See Comments)    Reaction:  Facial swelling Has patient had a PCN reaction causing immediate rash, facial/tongue/throat swelling, SOB or lightheadedness with hypotension: Yes Has patient had a PCN reaction causing severe rash involving mucus membranes or skin necrosis: No Has patient had a PCN reaction that required hospitalization No Has patient had a PCN reaction  occurring within the last 10 years: No If all of the above answers are "NO", then may proceed with Cephalosporin use.  . Simvastatin Other (See Comments)    Reaction:  Muscle pain     Family history: family history includes Diabetes in his mother.  Prior to Admission medications   Medication Sig Start Date End Date Taking? Authorizing Provider  acetaminophen (TYLENOL) 500 MG tablet Take 1,000 mg by mouth every 6 (six) hours as needed for mild pain, moderate pain or headache.    Yes [provider]  aspirin EC 81 MG tablet Take 81 mg by mouth daily.    Yes [provider]  atenolol (TENORMIN) 50 MG tablet Take 25 mg by mouth daily.    Yes [provider]  furosemide (LASIX) 20 MG tablet Take 20 mg by mouth daily.    Yes [provider]  insulin glargine (LANTUS) 100 UNIT/ML injection Inject 33 Units into the skin at bedtime.    Yes [provider]  insulin lispro (HUMALOG JUNIOR KWIKPEN) 100 UNIT/ML KiwkPen Inject 12-13 Units into the skin daily.    Yes [provider]  Multiple Vitamin (MULTIVITAMIN WITH MINERALS) TABS tablet Take 1 tablet by mouth daily.   Yes [provider]  Multiple Vitamins-Minerals (ICAPS AREDS 2) CAPS Take 1 capsule by mouth daily.   Yes [provider]       Physical Exam: BP (!) 101/40   Pulse (!) 119   Temp 98.2 F (36.8 C) (Oral)   Resp 18   Wt 117.9 kg (260 lb)   SpO2 94%   BMI 35.26 kg/m  General appearance: Well-developed, elderly adult male, alert and in no acute distress.   Eyes: Anicteric, conjunctiva pink, lids and lashes normal. PERRL.    ENT: No nasal deformity, discharge, epistaxis.  Hearing normal. OP moist without lesions.   Skin: Warm and dry.  Some bruises.  Left arm and elbow in sling.  No jaundice.  No suspicious rashes or lesions. Cardiac: Irregular, normal rate, nl S1-S2, no murmurs appreciated.  Capillary refill is brisk.  JVP not visible.  Nonpitting edema.   Radial pulses 2+ and symmetric.  Feet cool, but sensation and movement good. Respiratory: Normal respiratory rate and rhythm.  CTAB without rales or wheezes. Abdomen: Abdomen soft.  No TTP. No ascites, distension, hepatosplenomegaly.   MSK: No deformities or effusions.  No cyanosis or clubbing.  Left arm in sling. Neuro: Cranial nerves 3-12 intact.  Sensation intact to light touch. Speech is fluent.  Muscle strength 5/5 in all extremities tested except left arm.    Psych: Sensorium intact and responding to questions, attention normal.  Behavior appropriate.  Affect normal.  Judgment and insight appear normal.     Labs on Admission:  I have personally reviewed following labs and imaging studies: CBC:  Recent Labs Lab 03/05/17 2237  WBC 13.4*  NEUTROABS 10.6*  HGB 13.8  HCT 41.2  MCV 99.0  PLT 146*   Basic Metabolic Panel:  Recent Labs Lab 03/05/17 2237  NA 134*  K 5.1  CL 98*  CO2 26  GLUCOSE 206*  BUN 26*  CREATININE 1.18  CALCIUM 8.9   GFR: CrCl cannot be calculated (Unknown ideal weight.).  Liver Function Tests: No results for input(s): AST, ALT, ALKPHOS, BILITOT, PROT, ALBUMIN in the last 168 hours. No results for input(s): LIPASE, AMYLASE in the last 168 hours. No results for input(s): AMMONIA in the last 168 hours. Coagulation Profile: No results for input(s): INR, PROTIME in the last 168 hours. Cardiac Enzymes:  Recent Labs Lab 03/05/17 2237  CKTOTAL 760*   BNP (last 3 results) No results for input(s): PROBNP in the last 8760 hours. HbA1C: No results for input(s): HGBA1C in the last 72 hours. CBG: No results for input(s): GLUCAP in the last 168 hours. Lipid Profile: No results for input(s): CHOL, HDL, LDLCALC, TRIG, CHOLHDL, LDLDIRECT in the last 72 hours. Thyroid Function Tests: No results for input(s): TSH, T4TOTAL, FREET4, T3FREE, THYROIDAB in the last 72 hours. Anemia Panel: No results for input(s): VITAMINB12, FOLATE, FERRITIN, TIBC, IRON,  RETICCTPCT in the last 72 hours. Sepsis Labs: Invalid input(s): PROCALCITONIN, LACTICIDVEN No results found for this or any previous visit (from the past 240 hour(s)).       Radiological Exams on Admission: Personally reviewed CXR shows pacer, no focal opacity; CT head report and reports of left shoulder, humerus, elbow and wrist were reviewed: Dg Chest 1 View  Result Date: 03/06/2017 CLINICAL DATA:  Fall today in car port. Left upper extremity pain most prominent at the shoulder with multiple skin tears. EXAM: CHEST 1 VIEW COMPARISON:  Radiographs 04/20/2016 FINDINGS: Left-sided pacemaker in place, leads projecting over the right atrium and ventricle.The cardiomediastinal contours are unchanged with stable cardiomegaly. Pulmonary vasculature is normal. No consolidation, pleural effusion, or pneumothorax. No acute osseous abnormalities are seen. Left proximal humerus fracture not well visualized. IMPRESSION: Stable cardiomegaly.  No evidence of acute or traumatic abnormality. Electronically Signed   By: Rubye Oaks M.D.   On: 03/06/2017 00:32   Dg Elbow Complete Left  Result Date: 03/06/2017 CLINICAL DATA:  Fall today in car port. Left upper extremity pain most prominent at the shoulder with multiple skin tears. EXAM: LEFT ELBOW - COMPLETE 3+ VIEW COMPARISON:  None. FINDINGS: There is no evidence of fracture, dislocation, or joint effusion. Soft tissue air in the medial soft tissues. No radiopaque foreign body. IMPRESSION: 1. No fracture or subluxation. 2. Soft tissue air in the medial soft tissues suggests laceration. No radiopaque foreign body. Electronically Signed   By: Rubye Oaks M.D.   On: 03/06/2017 00:30   Dg Wrist Complete Left  Result Date: 03/06/2017 CLINICAL DATA:  Fall today in car port. Left upper extremity pain most prominent at the shoulder with multiple skin tears. EXAM: LEFT WRIST - COMPLETE 3+ VIEW COMPARISON:  None. FINDINGS: There is no evidence of fracture or  dislocation. Moderate radiocarpal osteoarthritis. Advanced first carpometacarpal osteoarthritis. Scaphoid is intact. Mild soft tissue edema. IMPRESSION: 1. No fracture or dislocation. 2. Osteoarthritis, prominent at the base of the thumb. Electronically Signed   By: Rubye Oaks M.D.   On: 03/06/2017 00:31   Ct Head Wo Contrast  Result Date: 03/06/2017 CLINICAL DATA:  Fall today with head injury. EXAM: CT HEAD WITHOUT CONTRAST TECHNIQUE: Contiguous axial images were obtained from the base of the skull through the vertex without intravenous contrast. COMPARISON:  None. FINDINGS: Brain: Generalized cerebral and  cerebellar atrophy. Remote lacunar infarct in the left caudate. Mild for age chronic small vessel ischemic change. No intracranial hemorrhage, subdural or extra-axial fluid collection. No mass effect or midline shift. Vascular: Atherosclerosis of skullbase vasculature without hyperdense vessel or abnormal calcification. Skull: No skull fracture.  No focal lesion. Sinuses/Orbits: Paranasal sinuses and mastoid air cells are clear. The visualized orbits are unremarkable. Bilateral cataract resection. Other: None. IMPRESSION: 1.  No acute intracranial abnormality. 2. Atrophy and chronic small vessel ischemia. Remote lacunar infarct in the left caudate. Electronically Signed   By: Rubye Oaks M.D.   On: 03/06/2017 00:11   Dg Shoulder Left  Result Date: 03/06/2017 CLINICAL DATA:  Fall today in car port. Left upper extremity pain most prominent at the shoulder with multiple skin tears. EXAM: LEFT SHOULDER - 2+ VIEW COMPARISON:  None. FINDINGS: Comminuted displaced fracture of the humeral head and neck. Humeral shaft displaced medially. Dominant fracture line just inferior to the glenohumeral joint. There is comminuted left displaced involvement of the lateral head. Acromioclavicular joint is congruent. IMPRESSION: Comminuted displaced fracture of the humeral head/neck. Electronically Signed   By:  Rubye Oaks M.D.   On: 03/06/2017 00:29   Dg Humerus Left  Result Date: 03/06/2017 CLINICAL DATA:  Fall today in car port. Left upper extremity pain most prominent at the shoulder with multiple skin tears. EXAM: LEFT HUMERUS - 2+ VIEW COMPARISON:  None. FINDINGS: Comminuted fracture of the humeral neck and lateral humeral head. There is displacement of the dominant humeral shaft fragment 2.5 cm medially. Fracture line appears just inferior to the glenohumeral joint. The distal humerus is intact. Soft tissue edema noted about the elbow. IMPRESSION: Comminuted displaced humeral neck and lateral head fracture. Fracture appears just inferior to the glenohumeral joint. Electronically Signed   By: Rubye Oaks M.D.   On: 03/06/2017 00:28    EKG: Independently reviewed. Rate 82, AV paced.         Assessment/Plan  1. Humerus fracture:  No syncope during the event.  Mechanical fall. -Consult to orthopedics -Maintain immobilizer -Acetaminophen scheduled for pain, oxycodone 2.5-5 mg PRN if needed -PT/OT consult -CM consult   2. Insulin-dependent diabetes:  Hyperglycemic at admission -Continue glargine, slightly adjust dose down while in hospital -SSI with meals -Glucerna shake daily  3. Hypertension and chronic diastolic CHF:  Normotensive at admission.  Euglycemic to hypovolemic. -Continue furosemide -Continue BB -Continue aspirin  4. Elevated CK and elevated creatinine:  Mild AKI in setting of lying on floor for 8 hours. -Fluids and repeat CK and BMP in AM  5. Hyponatremia:  Mild, likely hypovolemic. -Repeat BMP in AM      DVT prophylaxis: Lovenox  Code Status: FULL  Family Communication: Son at bedside  Disposition Plan: Anticipate PT/OT, CM consult and likely home tomorrow after Orthopedics evaluation if no surgery planned. Consults called: Ortho, Dr. Romeo Apple Admission status: OBS At the point of initial evaluation, it is my clinical opinion that admission  for OBSERVATION is reasonable and necessary because the patient's presenting complaints in the context of their chronic conditions represent sufficient risk of deterioration or significant morbidity to constitute reasonable grounds for close observation in the hospital setting, but that the patient may be medically stable for discharge from the hospital within 24 to 48 hours.    Medical decision making: Patient seen at 1:51 AM on 03/06/2017.  The patient was discussed with Dr. Manus Gunning.  What exists of the patient's chart was reviewed in depth and summarized above.  Clinical condition: stable.        Edwin Dada [facility] Pager [phone]

## 2017-03-06 NOTE — Progress Notes (Signed)
PROGRESS NOTE    Alan CollumRobert C Dineen  ZOX:[id number] DOB: May 20, 1922 DOA: 03/05/2017 PCP: Benita StabileHall, John Z, MD    Brief Narrative:  Alan Briggs is a 83 y.o. male with a past medical history significant for IDDM, SSS with pacer, hx of Afib not on anticoagulation due to recurrent falls, and OSA not on CPAP who presents with fall and arm fracture.  The patient lives alone, still drives, and manages all of his own affairs. He was in his usual state of health until this afternoon around 1 PM, he was coming back to the house from running some errands, was walking up the stairs in his carport, when he lost his balance, fell backwards down a few stairs landing on concrete on his left side. He had immediate pain in his left arm and could not get up. He laid there on the floor for approximately 8 hours until his son [place] and found him and called EMS.  He had no prodrome, dizziness, lightheadedness, LOC.  He had no chest pain, palpitations.  He just lost his balance.   Assessment & Plan:   Principal Problem:   Humerus fracture Active Problems:   Pacemaker   Chronic diastolic heart failure (HCC)   Chronic obstructive pulmonary disease (HCC)   Controlled diabetes mellitus type 2 with complications (HCC)   Essential hypertension   AKI (acute kidney injury) (HCC)   Humerus fracture:  No syncope during the event.  Mechanical fall. -Consult ortho surgery - consult CSW as patient NWB on fractured humerus -Maintain immobilizer -Acetaminophen scheduled for pain, oxycodone 2.5-5 mg PRN if needed -PT recommending SNF  Insulin-dependent diabetes:  - Hyperglycemic at admission - continue lantus and SSI -Glucerna shake daily  Hypertension and chronic diastolic CHF:  - normotensive - continue to monitor Blood pressure -Continue furosemide -Continue aspirin  Elevated CK and elevated creatinine:  - CK slightly decreased today - Cr improved to 1.09 - continue IVF for additional  9 hours - monitor urine output  Hyponatremia:  - resolved with IVF    DVT prophylaxis: Lovenox  Code Status: FULL  Family Communication: no family bedside  Disposition Plan: will need SNF placement- CSW consulted   Consultants:   Ortho, Dr. Romeo AppleHarrison  Procedures:   None  Antimicrobials:   None    Subjective: Patient seen and examined.  He reports he would like to stay in the hospital until his arm is fixed.  Objective: Vitals:   03/06/17 0145 03/06/17 0200 03/06/17 0256 03/06/17 0558  BP:  (!) 101/40 129/81 109/79  Pulse: 61 (!) 119 70 65  Resp: 20 18 18 18   Temp:   98.2 F (36.8 C) 98.4 F (36.9 C)  TempSrc:   Oral Oral  SpO2: 96% 94% 100% 98%  Weight:   119.5 kg (263 lb 7.2 oz)   Height:   6' (1.829 m)     Intake/Output Summary (Last 24 hours) at 03/06/17 1235 Last data filed at 03/06/17 0537  Gross per 24 hour  Intake              700 ml  Output                0 ml  Net              700 ml   Filed Weights   03/05/17 2154 03/06/17 0256  Weight: 117.9 kg (260 lb) 119.5 kg (263 lb 7.2 oz)    Examination:  General exam:  Appears calm and comfortable  Respiratory system: Clear to auscultation. Respiratory effort normal. Cardiovascular system: S1 & S2 heard, RRR. No JVD, murmurs, rubs, gallops or clicks. No pedal edema. Gastrointestinal system: Abdomen is nondistended, soft and nontender. No organomegaly or masses felt. Normal bowel sounds heard. Central nervous system: Alert and oriented. No focal neurological deficits. Extremities: immobilized left arm in slight, right arm and bilateral legs able to perform independent movements Skin: large ecchymosis on right hand and forearm, large ecchymosis on anterior left arm Psychiatry: Judgement and insight appear normal. Mood & affect appropriate.     Data Reviewed: I have personally reviewed following labs and imaging studies  CBC:  Recent Labs Lab 03/05/17 2237 03/06/17 0749  WBC 13.4* 10.2    NEUTROABS 10.6*  --   HGB 13.8 12.4*  HCT 41.2 36.7*  MCV 99.0 98.9  PLT 146* 138*   Basic Metabolic Panel:  Recent Labs Lab 03/05/17 2237 03/06/17 0749  NA 134* 136  K 5.1 5.0  CL 98* 101  CO2 26 27  GLUCOSE 206* 194*  BUN 26* 31*  CREATININE 1.18 1.09  CALCIUM 8.9 8.4*   GFR: Estimated Creatinine Clearance: 56.5 mL/min (by C-G formula based on SCr of 1.09 mg/dL). Liver Function Tests: No results for input(s): AST, ALT, ALKPHOS, BILITOT, PROT, ALBUMIN in the last 168 hours. No results for input(s): LIPASE, AMYLASE in the last 168 hours. No results for input(s): AMMONIA in the last 168 hours. Coagulation Profile: No results for input(s): INR, PROTIME in the last 168 hours. Cardiac Enzymes:  Recent Labs Lab 03/05/17 2237 03/06/17 0749  CKTOTAL 760* 719*   BNP (last 3 results) No results for input(s): PROBNP in the last 8760 hours. HbA1C: No results for input(s): HGBA1C in the last 72 hours. CBG:  Recent Labs Lab 03/06/17 0815 03/06/17 1200  GLUCAP 176* 190*   Lipid Profile: No results for input(s): CHOL, HDL, LDLCALC, TRIG, CHOLHDL, LDLDIRECT in the last 72 hours. Thyroid Function Tests: No results for input(s): TSH, T4TOTAL, FREET4, T3FREE, THYROIDAB in the last 72 hours. Anemia Panel: No results for input(s): VITAMINB12, FOLATE, FERRITIN, TIBC, IRON, RETICCTPCT in the last 72 hours. Sepsis Labs: No results for input(s): PROCALCITON, LATICACIDVEN in the last 168 hours.  No results found for this or any previous visit (from the past 240 hour(s)).       Radiology Studies: Dg Chest 1 View  Result Date: 03/06/2017 CLINICAL DATA:  Fall today in car port. Left upper extremity pain most prominent at the shoulder with multiple skin tears. EXAM: CHEST 1 VIEW COMPARISON:  Radiographs 04/20/2016 FINDINGS: Left-sided pacemaker in place, leads projecting over the right atrium and ventricle.The cardiomediastinal contours are unchanged with stable cardiomegaly.  Pulmonary vasculature is normal. No consolidation, pleural effusion, or pneumothorax. No acute osseous abnormalities are seen. Left proximal humerus fracture not well visualized. IMPRESSION: Stable cardiomegaly.  No evidence of acute or traumatic abnormality. Electronically Signed   By: Rubye Oaks M.D.   On: 03/06/2017 00:32   Dg Elbow Complete Left  Result Date: 03/06/2017 CLINICAL DATA:  Fall today in car port. Left upper extremity pain most prominent at the shoulder with multiple skin tears. EXAM: LEFT ELBOW - COMPLETE 3+ VIEW COMPARISON:  None. FINDINGS: There is no evidence of fracture, dislocation, or joint effusion. Soft tissue air in the medial soft tissues. No radiopaque foreign body. IMPRESSION: 1. No fracture or subluxation. 2. Soft tissue air in the medial soft tissues suggests laceration. No radiopaque foreign body. Electronically Signed  By: Rubye Oaks M.D.   On: 03/06/2017 00:30   Dg Wrist Complete Left  Result Date: 03/06/2017 CLINICAL DATA:  Fall today in car port. Left upper extremity pain most prominent at the shoulder with multiple skin tears. EXAM: LEFT WRIST - COMPLETE 3+ VIEW COMPARISON:  None. FINDINGS: There is no evidence of fracture or dislocation. Moderate radiocarpal osteoarthritis. Advanced first carpometacarpal osteoarthritis. Scaphoid is intact. Mild soft tissue edema. IMPRESSION: 1. No fracture or dislocation. 2. Osteoarthritis, prominent at the base of the thumb. Electronically Signed   By: Rubye Oaks M.D.   On: 03/06/2017 00:31   Ct Head Wo Contrast  Result Date: 03/06/2017 CLINICAL DATA:  Fall today with head injury. EXAM: CT HEAD WITHOUT CONTRAST TECHNIQUE: Contiguous axial images were obtained from the base of the skull through the vertex without intravenous contrast. COMPARISON:  None. FINDINGS: Brain: Generalized cerebral and cerebellar atrophy. Remote lacunar infarct in the left caudate. Mild for age chronic small vessel ischemic change. No  intracranial hemorrhage, subdural or extra-axial fluid collection. No mass effect or midline shift. Vascular: Atherosclerosis of skullbase vasculature without hyperdense vessel or abnormal calcification. Skull: No skull fracture.  No focal lesion. Sinuses/Orbits: Paranasal sinuses and mastoid air cells are clear. The visualized orbits are unremarkable. Bilateral cataract resection. Other: None. IMPRESSION: 1.  No acute intracranial abnormality. 2. Atrophy and chronic small vessel ischemia. Remote lacunar infarct in the left caudate. Electronically Signed   By: Rubye Oaks M.D.   On: 03/06/2017 00:11   Dg Shoulder Left  Result Date: 03/06/2017 CLINICAL DATA:  Larey Seat yesterday, pt in left shoulder immobilizer with proximal humerus fx, no Y-view performed on 03/05/17 EXAM: LEFT SHOULDER - 2+ VIEW COMPARISON:  03/05/2017 FINDINGS: Comminuted fracture of the proximal left humerus is again noted. The humeral head component remains normally aligned with the glenoid. The shaft fracture component is displaced anteriorly and medially by just over 2 cm. There is surrounding soft tissue swelling. Bones are diffusely demineralized. IMPRESSION: 1. Comminuted proximal left humeral fracture appears less displaced than on the previous day's study. There is no dislocation. Humeral head is normally aligned with the glenoid. Electronically Signed   By: Amie Portland M.D.   On: 03/06/2017 08:44   Dg Shoulder Left  Result Date: 03/06/2017 CLINICAL DATA:  Fall today in car port. Left upper extremity pain most prominent at the shoulder with multiple skin tears. EXAM: LEFT SHOULDER - 2+ VIEW COMPARISON:  None. FINDINGS: Comminuted displaced fracture of the humeral head and neck. Humeral shaft displaced medially. Dominant fracture line just inferior to the glenohumeral joint. There is comminuted left displaced involvement of the lateral head. Acromioclavicular joint is congruent. IMPRESSION: Comminuted displaced fracture of the  humeral head/neck. Electronically Signed   By: Rubye Oaks M.D.   On: 03/06/2017 00:29   Dg Humerus Left  Result Date: 03/06/2017 CLINICAL DATA:  Fall today in car port. Left upper extremity pain most prominent at the shoulder with multiple skin tears. EXAM: LEFT HUMERUS - 2+ VIEW COMPARISON:  None. FINDINGS: Comminuted fracture of the humeral neck and lateral humeral head. There is displacement of the dominant humeral shaft fragment 2.5 cm medially. Fracture line appears just inferior to the glenohumeral joint. The distal humerus is intact. Soft tissue edema noted about the elbow. IMPRESSION: Comminuted displaced humeral neck and lateral head fracture. Fracture appears just inferior to the glenohumeral joint. Electronically Signed   By: Rubye Oaks M.D.   On: 03/06/2017 00:28  Scheduled Meds: . acetaminophen  1,000 mg Oral TID  . aspirin EC  81 mg Oral Daily  . atenolol  25 mg Oral Daily  . enoxaparin (LOVENOX) injection  40 mg Subcutaneous Q24H  . feeding supplement (GLUCERNA SHAKE)  237 mL Oral QPC breakfast  . insulin aspart  0-5 Units Subcutaneous QHS  . insulin aspart  0-9 Units Subcutaneous TID WC  . insulin glargine  25 Units Subcutaneous QHS  . povidone-iodine       Continuous Infusions:   LOS: 0 days    Time spent: 30 minutes    Katrinka Blazing, MD [facility] Pager [phone]  If 7PM-7AM, please contact night-coverage [email or web address] Password [facility] 03/06/2017, 12:35 PM

## 2017-03-06 NOTE — Evaluation (Signed)
Physical Therapy Evaluation Patient Details Name: Alan Briggs MRN: [id number] DOB: 1922/05/13 Today's Date: 03/06/2017   History of Present Illness  83 y.o. male with a past medical history significant for IDDM, SSS with pacer, hx of Afib not on anticoagulation due to recurrent falls, and OSA not on CPAP who presents with fall and L humerus fx. fracture.    Clinical Impression  Pt received in bed, and son arrived during PT evaluation.  Pt normally lives alone, and uses a RW for ambulation almost all the time.  He is independent with ADL's, however his family assists him with meals and house keeping.  During PT evaluation today, he required Mod A for bed mobility and transfer supine<>sit.  He states he always takes a few minutes sitting on the EOB prior to standing.  He was able to perform sit<>Stand and SPT bed<>chair going to the right with Min guard and HHA.  He was mildly unsteady with this transfer.  At this time, he is recommended for SNF due to high risk for recurrent falls, being NWB on the R UE which means he is unable to use his RW at this time, and he lives alone.      Follow Up Recommendations SNF (Pt states he does not want to go to the [facility]. )    Equipment Recommendations  None recommended by PT    Recommendations for Other Services       Precautions / Restrictions Precautions Precautions: Fall Precaution Comments: 1-2 falls in the past 6 months.   Required Braces or Orthoses: Sling Restrictions Weight Bearing Restrictions: Yes LUE Weight Bearing: Non weight bearing      Mobility  Bed Mobility Overal bed mobility: Needs Assistance Bed Mobility: Supine to Sit     Supine to sit: HOB elevated;Mod assist     General bed mobility comments: Assist for scooting forward, and support at trunk.  Pt refuses to allow PT to assist, and insists that his son assist him.   Transfers Overall transfer level: Needs assistance Equipment used: 1 person hand held  assist Transfers: Stand Pivot Transfers   Stand pivot transfers: From elevated surface;Min guard       General transfer comment: Chair positioned on his right side, and pt was able to stand up fully, and take several steps to the right for transfer bed<>Chair.    Ambulation/Gait Ambulation/Gait assistance:  (Gait deferred as pt was wanting to eat his breakfast - would have likely been able to ambulate a short distance with a cane in the R hand. )              Stairs            Wheelchair Mobility    Modified Rankin (Stroke Patients Only)       Balance Overall balance assessment: History of Falls;Needs assistance Sitting-balance support: Single extremity supported;Feet supported Sitting balance-Leahy Scale: Good     Standing balance support: Single extremity supported (provided by son) Standing balance-Leahy Scale: Fair                               Pertinent Vitals/Pain Pain Assessment: 0-10 Pain Score: 8  Pain Location: L UE Pain Descriptors / Indicators: Aching Pain Intervention(s): Limited activity within patient's tolerance;Monitored during session;Repositioned    Home Living   Living Arrangements: Alone Available Help at Discharge: Family (4boys are in and out- 1 comes Monday, and one Thursday, and  the others in between.  ) Type of Home: House Home Access: Stairs to enter   [employer] CorporationEntrance Stairs-Number of Steps: 4-5 with HR Home Layout: One level Home Equipment: Walker - 2 wheels;Cane - single point;Bedside commode;Shower seat      Prior Function     Gait / Transfers Assistance Needed: Pt ambulates with RW at all times.  Occasionally furniture walks in the house.    ADL's / Homemaking Assistance Needed: independent  Comments: Pt still driving - boys assist with cooking and cleaning.       Hand Dominance   Dominant Hand: Right    Extremity/Trunk Assessment   Upper Extremity Assessment Upper Extremity Assessment: LUE  deficits/detail (R UE is WFL) LUE Deficits / Details: L shoulder immobilizer on, and in good position.  LUE: Unable to fully assess due to immobilization    Lower Extremity Assessment Lower Extremity Assessment: Overall WFL for tasks assessed       Communication   Communication: HOH  Cognition Arousal/Alertness: Awake/alert Behavior During Therapy: WFL for tasks assessed/performed Overall Cognitive Status: Within Functional Limits for tasks assessed                                        General Comments General comments (skin integrity, edema, etc.): Son reports that the quarter size wound on his R shin is a diabetic wound that was present prior to the fall.  He also has a large wound on the anterior surface of the L shoulder from the fall.     Exercises     Assessment/Plan    PT Assessment Patient needs continued PT services  PT Problem List Decreased activity tolerance;Decreased balance;Decreased mobility;Obesity;Decreased skin integrity;Pain       PT Treatment Interventions DME instruction;Gait training;Functional mobility training;Therapeutic activities;Therapeutic exercise;Balance training;Patient/family education;Wheelchair mobility training    PT Goals (Current goals can be found in the Care Plan section)  Acute Rehab PT Goals Patient Stated Goal: Pt wants to get stronger and go home.  PT Goal Formulation: With patient/family Time For Goal Achievement: 03/20/17 Potential to Achieve Goals: Good    Frequency Min 5X/week   Barriers to discharge Decreased caregiver support Pt lives alone    Co-evaluation               AM-PAC PT "6 Clicks" Daily Activity  Outcome Measure Difficulty turning over in bed (including adjusting bedclothes, sheets and blankets)?: A Lot Difficulty moving from lying on back to sitting on the side of the bed? : A Lot Difficulty sitting down on and standing up from a chair with arms (e.g., wheelchair, bedside commode,  etc,.)?: A Little Help needed moving to and from a bed to chair (including a wheelchair)?: A Little Help needed walking in hospital room?: A Little Help needed climbing 3-5 steps with a railing? : A Lot 6 Click Score: 15    End of Session Equipment Utilized During Treatment: Gait belt;Other (comment) (L UE shoulder immobilizer) Activity Tolerance: Patient tolerated treatment well Patient left: in chair;with call bell/phone within reach;with chair alarm set;with family/visitor present Nurse Communication: Mobility status Lowanda Foster(Brittany, RN notified of pt's mobility status, and mobiltiy sheet left hanging in the room. ) PT Visit Diagnosis: Unsteadiness on feet (R26.81);Other abnormalities of gait and mobility (R26.89);Muscle weakness (generalized) (M62.81);History of falling (Z91.81);Pain Pain - Right/Left: Left Pain - part of body: Shoulder;Arm    Time: [id number] PT Time Calculation (min) (  ACUTE ONLY): 44 min   Charges:   PT Evaluation $PT Eval Low Complexity: 1 Procedure PT Treatments $Therapeutic Activity: 8-22 mins   PT G Codes:   PT G-Codes **NOT FOR INPATIENT CLASS** Functional Assessment Tool Used: AM-PAC 6 Clicks Basic Mobility;Clinical judgement Functional Limitation: Mobility: Walking and moving around Mobility: Walking and Moving Around Current Status (Z6109): At least 40 percent but less than 60 percent impaired, limited or restricted Mobility: Walking and Moving Around Goal Status [phone]): At least 20 percent but less than 40 percent impaired, limited or restricted    Beth Debi Cousin, PT, DPT X: [phone]

## 2017-03-07 DIAGNOSIS — W19XXXA Unspecified fall, initial encounter: Secondary | ICD-10-CM

## 2017-03-07 DIAGNOSIS — S51012A Laceration without foreign body of left elbow, initial encounter: Secondary | ICD-10-CM

## 2017-03-07 DIAGNOSIS — T07XXXA Unspecified multiple injuries, initial encounter: Secondary | ICD-10-CM

## 2017-03-07 DIAGNOSIS — Z950 Presence of cardiac pacemaker: Secondary | ICD-10-CM

## 2017-03-07 LAB — GLUCOSE, CAPILLARY
GLUCOSE-CAPILLARY: 207 mg/dL — AB (ref 65–99)
GLUCOSE-CAPILLARY: 163 mg/dL — AB (ref 65–99)
Glucose-Capillary: 147 mg/dL — ABNORMAL HIGH (ref 65–99)
Glucose-Capillary: 168 mg/dL — ABNORMAL HIGH (ref 65–99)

## 2017-03-07 NOTE — Progress Notes (Signed)
PROGRESS NOTE    Alan Briggs  XBJ:[id number] DOB: 1922-06-28 DOA: 03/05/2017 PCP: Benita Stabile, MD    Brief Narrative:  Alan Briggs is a 83 y.o. male with a past medical history significant for IDDM, SSS with pacer, hx of Afib not on anticoagulation due to recurrent falls, and OSA not on CPAP who presents with fall and arm fracture.  The patient lives alone, still drives, and manages all of his own affairs. He was in his usual state of health until this afternoon around 1 PM, he was coming back to the house from running some errands, was walking up the stairs in his carport, when he lost his balance, fell backwards down a few stairs landing on concrete on his left side. He had immediate pain in his left arm and could not get up. He laid there on the floor for approximately 8 hours until his son drove up from near [place] and found him and called EMS.  He had no prodrome, dizziness, lightheadedness, LOC.  He had no chest pain, palpitations.  He just lost his balance.   Assessment & Plan:   Principal Problem:   Humerus fracture Active Problems:   Pacemaker   Chronic diastolic heart failure (HCC)   Chronic obstructive pulmonary disease (HCC)   Controlled diabetes mellitus type 2 with complications (HCC)   Essential hypertension   AKI (acute kidney injury) (HCC)   Humerus fracture:  - ortho surgery consulted - per ortho note patient not an operative candidate - consult CSW as patient NWB on fractured humerus -Maintain immobilizer - CT of shoulder ordered -Acetaminophen scheduled for pain, oxycodone 2.5-5 mg PRN if needed - PT/ OT recommending SNF - will follow up with CSW for possible placement  Insulin-dependent diabetes:  - Hyperglycemic at admission - continue lantus and SSI -Glucerna shake daily - carb modified diet  Hypertension and chronic diastolic CHF:  - normotensive - continue to monitor blood pressure -Continue furosemide -Continue aspirin  Elevated CK  and elevated creatinine:  - CK slightly decreased today - repeat BMP in am as well as CK  Hyponatremia:  - resolved with IVF    DVT prophylaxis: Lovenox  Code Status: FULL  Family Communication: son bedside Disposition Plan: difficult situation- will discuss with CSW tomorrow   Consultants:   Ortho, Dr. Romeo Apple  Procedures:   None  Antimicrobials:   None    Subjective: Patient reports some pain and states he feels like his pain gets out of control prior to getting medications to control it.  Objective: Vitals:   03/06/17 0558 03/06/17 1944 03/07/17 0556 03/07/17 1632  BP: 109/79  127/64 118/63  Pulse: 65  (!) 58 61  Resp: 18  18 18   Temp: 98.4 F (36.9 C)  98.4 F (36.9 C) 98.5 F (36.9 C)  TempSrc: Oral  Oral Oral  SpO2: 98% 93% 98% 97%  Weight:      Height:        Intake/Output Summary (Last 24 hours) at 03/07/17 1800 Last data filed at 03/07/17 1200  Gross per 24 hour  Intake              600 ml  Output             1300 ml  Net             -700 ml   Filed Weights   03/05/17 2154 03/06/17 0256  Weight: 117.9 kg (260 lb) 119.5 kg (263 lb 7.2 oz)  Examination:  General exam: Appears calm and comfortable  Respiratory system: Clear to auscultation. Respiratory effort normal. Cardiovascular system: S1 & S2 heard, RRR. No JVD, murmurs, rubs, gallops or clicks. No pedal edema. Gastrointestinal system: Abdomen is obese, nondistended, soft and nontender. No organomegaly or masses felt. Normal bowel sounds heard. Central nervous system: Alert and oriented. No focal neurological deficits. Extremities: immobilized left arm in sling, left hand neurovascularly intact, right arm and bilateral legs able to perform independent movements Skin: large ecchymosis on right hand and forearm, large ecchymosis on anterior left arm and developing ecchymosis of the left arm Psychiatry: Mood & affect appropriate.     Data Reviewed: I have personally reviewed  following labs and imaging studies  CBC:  Recent Labs Lab 03/05/17 2237 03/06/17 0749  WBC 13.4* 10.2  NEUTROABS 10.6*  --   HGB 13.8 12.4*  HCT 41.2 36.7*  MCV 99.0 98.9  PLT 146* 138*   Basic Metabolic Panel:  Recent Labs Lab 03/05/17 2237 03/06/17 0749  NA 134* 136  K 5.1 5.0  CL 98* 101  CO2 26 27  GLUCOSE 206* 194*  BUN 26* 31*  CREATININE 1.18 1.09  CALCIUM 8.9 8.4*   GFR: Estimated Creatinine Clearance: 56.5 mL/min (by C-G formula based on SCr of 1.09 mg/dL). Liver Function Tests: No results for input(s): AST, ALT, ALKPHOS, BILITOT, PROT, ALBUMIN in the last 168 hours. No results for input(s): LIPASE, AMYLASE in the last 168 hours. No results for input(s): AMMONIA in the last 168 hours. Coagulation Profile: No results for input(s): INR, PROTIME in the last 168 hours. Cardiac Enzymes:  Recent Labs Lab 03/05/17 2237 03/06/17 0749  CKTOTAL 760* 719*   BNP (last 3 results) No results for input(s): PROBNP in the last 8760 hours. HbA1C: No results for input(s): HGBA1C in the last 72 hours. CBG:  Recent Labs Lab 03/06/17 1656 03/06/17 2329 03/07/17 0734 03/07/17 1107 03/07/17 1620  GLUCAP 165* 184* 147* 207* 163*   Lipid Profile: No results for input(s): CHOL, HDL, LDLCALC, TRIG, CHOLHDL, LDLDIRECT in the last 72 hours. Thyroid Function Tests: No results for input(s): TSH, T4TOTAL, FREET4, T3FREE, THYROIDAB in the last 72 hours. Anemia Panel: No results for input(s): VITAMINB12, FOLATE, FERRITIN, TIBC, IRON, RETICCTPCT in the last 72 hours. Sepsis Labs: No results for input(s): PROCALCITON, LATICACIDVEN in the last 168 hours.  No results found for this or any previous visit (from the past 240 hour(s)).       Radiology Studies: Dg Chest 1 View  Result Date: 03/06/2017 CLINICAL DATA:  Fall today in car port. Left upper extremity pain most prominent at the shoulder with multiple skin tears. EXAM: CHEST 1 VIEW COMPARISON:  Radiographs  04/20/2016 FINDINGS: Left-sided pacemaker in place, leads projecting over the right atrium and ventricle.The cardiomediastinal contours are unchanged with stable cardiomegaly. Pulmonary vasculature is normal. No consolidation, pleural effusion, or pneumothorax. No acute osseous abnormalities are seen. Left proximal humerus fracture not well visualized. IMPRESSION: Stable cardiomegaly.  No evidence of acute or traumatic abnormality. Electronically Signed   By: Rubye OaksMelanie  Ehinger M.D.   On: 03/06/2017 00:32   Dg Elbow Complete Left  Result Date: 03/06/2017 CLINICAL DATA:  Fall today in car port. Left upper extremity pain most prominent at the shoulder with multiple skin tears. EXAM: LEFT ELBOW - COMPLETE 3+ VIEW COMPARISON:  None. FINDINGS: There is no evidence of fracture, dislocation, or joint effusion. Soft tissue air in the medial soft tissues. No radiopaque foreign body. IMPRESSION: 1. No fracture  or subluxation. 2. Soft tissue air in the medial soft tissues suggests laceration. No radiopaque foreign body. Electronically Signed   By: Rubye Oaks M.D.   On: 03/06/2017 00:30   Dg Wrist Complete Left  Result Date: 03/06/2017 CLINICAL DATA:  Fall today in car port. Left upper extremity pain most prominent at the shoulder with multiple skin tears. EXAM: LEFT WRIST - COMPLETE 3+ VIEW COMPARISON:  None. FINDINGS: There is no evidence of fracture or dislocation. Moderate radiocarpal osteoarthritis. Advanced first carpometacarpal osteoarthritis. Scaphoid is intact. Mild soft tissue edema. IMPRESSION: 1. No fracture or dislocation. 2. Osteoarthritis, prominent at the base of the thumb. Electronically Signed   By: Rubye Oaks M.D.   On: 03/06/2017 00:31   Ct Head Wo Contrast  Result Date: 03/06/2017 CLINICAL DATA:  Fall today with head injury. EXAM: CT HEAD WITHOUT CONTRAST TECHNIQUE: Contiguous axial images were obtained from the base of the skull through the vertex without intravenous contrast.  COMPARISON:  None. FINDINGS: Brain: Generalized cerebral and cerebellar atrophy. Remote lacunar infarct in the left caudate. Mild for age chronic small vessel ischemic change. No intracranial hemorrhage, subdural or extra-axial fluid collection. No mass effect or midline shift. Vascular: Atherosclerosis of skullbase vasculature without hyperdense vessel or abnormal calcification. Skull: No skull fracture.  No focal lesion. Sinuses/Orbits: Paranasal sinuses and mastoid air cells are clear. The visualized orbits are unremarkable. Bilateral cataract resection. Other: None. IMPRESSION: 1.  No acute intracranial abnormality. 2. Atrophy and chronic small vessel ischemia. Remote lacunar infarct in the left caudate. Electronically Signed   By: Rubye Oaks M.D.   On: 03/06/2017 00:11   Dg Shoulder Left  Result Date: 03/06/2017 CLINICAL DATA:  Larey Seat yesterday, pt in left shoulder immobilizer with proximal humerus fx, no Y-view performed on 03/05/17 EXAM: LEFT SHOULDER - 2+ VIEW COMPARISON:  03/05/2017 FINDINGS: Comminuted fracture of the proximal left humerus is again noted. The humeral head component remains normally aligned with the glenoid. The shaft fracture component is displaced anteriorly and medially by just over 2 cm. There is surrounding soft tissue swelling. Bones are diffusely demineralized. IMPRESSION: 1. Comminuted proximal left humeral fracture appears less displaced than on the previous day's study. There is no dislocation. Humeral head is normally aligned with the glenoid. Electronically Signed   By: Amie Portland M.D.   On: 03/06/2017 08:44   Dg Shoulder Left  Result Date: 03/06/2017 CLINICAL DATA:  Fall today in car port. Left upper extremity pain most prominent at the shoulder with multiple skin tears. EXAM: LEFT SHOULDER - 2+ VIEW COMPARISON:  None. FINDINGS: Comminuted displaced fracture of the humeral head and neck. Humeral shaft displaced medially. Dominant fracture line just inferior to the  glenohumeral joint. There is comminuted left displaced involvement of the lateral head. Acromioclavicular joint is congruent. IMPRESSION: Comminuted displaced fracture of the humeral head/neck. Electronically Signed   By: Rubye Oaks M.D.   On: 03/06/2017 00:29   Dg Humerus Left  Result Date: 03/06/2017 CLINICAL DATA:  Fall today in car port. Left upper extremity pain most prominent at the shoulder with multiple skin tears. EXAM: LEFT HUMERUS - 2+ VIEW COMPARISON:  None. FINDINGS: Comminuted fracture of the humeral neck and lateral humeral head. There is displacement of the dominant humeral shaft fragment 2.5 cm medially. Fracture line appears just inferior to the glenohumeral joint. The distal humerus is intact. Soft tissue edema noted about the elbow. IMPRESSION: Comminuted displaced humeral neck and lateral head fracture. Fracture appears just inferior to the glenohumeral  joint. Electronically Signed   By: Rubye Oaks M.D.   On: 03/06/2017 00:28        Scheduled Meds: . acetaminophen  1,000 mg Oral TID  . aspirin EC  81 mg Oral Daily  . atenolol  25 mg Oral Daily  . enoxaparin (LOVENOX) injection  40 mg Subcutaneous Q24H  . feeding supplement (GLUCERNA SHAKE)  237 mL Oral QPC breakfast  . insulin aspart  0-5 Units Subcutaneous QHS  . insulin aspart  0-9 Units Subcutaneous TID WC  . insulin glargine  25 Units Subcutaneous QHS   Continuous Infusions:   LOS: 1 day    Time spent: 30 minutes    Katrinka Blazing, MD [facility] Pager [phone]  If 7PM-7AM, please contact night-coverage [email or web address] Password [email or web address] 03/07/2017, 6:00 PM

## 2017-03-07 NOTE — Evaluation (Signed)
Occupational Therapy Evaluation Patient Details Name: Alan Briggs MRN: [id number] DOB: 06/18/1922 Today's Date: 03/07/2017    History of Present Illness 83 y.o. male with a past medical history significant for IDDM, SSS with pacer, hx of Afib not on anticoagulation due to recurrent falls, and OSA not on CPAP who presents with fall and L humerus fx. fracture.   Clinical Impression   Patient in bed upon therapy arrival. Declined to sit on EOB or transfer this morning due to LUE pain. Pain meds requested during session although nursing did not appear. Reviewed LUE shoulder precautions. At this time, patient requires total assist for all Basic ADL tasks due to recent Left humerus fracture. Recommend that he receive OT services at SNF at discharge for education and to increase functional performance during ADL tasks while recovering from humerus fracture.     Follow Up Recommendations  SNF    Equipment Recommendations  None recommended by OT       Precautions / Restrictions Precautions Precautions: Fall Precaution Comments: 1-2 falls in the past 6 months.   Required Braces or Orthoses: Sling Restrictions Weight Bearing Restrictions: No LUE Weight Bearing: Non weight bearing             ADL either performed or assessed with clinical judgement         Vision Baseline Vision/History: No visual deficits              Pertinent Vitals/Pain Pain Assessment: 0-10 Pain Score: 5  Pain Location: L UE Pain Descriptors / Indicators: Aching Pain Intervention(s): Patient requesting pain meds-RN notified     Hand Dominance Right   Extremity/Trunk Assessment Upper Extremity Assessment Upper Extremity Assessment: LUE deficits/detail;RUE deficits/detail RUE Deficits / Details: RUE strength: 3/5 flexion and abduction. 4-/5 external and internal rotation. Decreased gross grasp. LUE Deficits / Details: Recent  Left humerus fx. In sling 24/7. No mobility allowed at this time. LUE:  Unable to fully assess due to immobilization   Lower Extremity Assessment Lower Extremity Assessment: Defer to PT evaluation       Communication Communication Communication: HOH   Cognition Arousal/Alertness: Awake/alert Behavior During Therapy: WFL for tasks assessed/performed Overall Cognitive Status: Within Functional Limits for tasks assessed             Shoulder Instructions      Home Living Family/patient expects to be discharged to:: Private residence Living Arrangements: Alone Available Help at Discharge: Family Type of Home: House Home Access: Stairs to enter [employer] of Steps: 4-5 with HR Entrance Stairs-Rails: Left Home Layout: One level               Home Equipment: Walker - 2 wheels;Cane - single point;Bedside commode;Shower seat          Prior Functioning/Environment Level of Independence: Independent with assistive device(s)  Gait / Transfers Assistance Needed: Pt ambulates with RW at all times.  Occasionally furniture walks in the house.   ADL's / Homemaking Assistance Needed: independent   Comments: Pt still driving - boys assist with cooking and cleaning.                                    AM-PAC PT "6 Clicks" Daily Activity     Outcome Measure Help from another person eating meals?: A Little Help from another person taking care of personal grooming?: A Little Help from another person toileting, which includes using toliet,  bedpan, or urinal?: Total Help from another person bathing (including washing, rinsing, drying)?: Total Help from another person to put on and taking off regular upper body clothing?: Total Help from another person to put on and taking off regular lower body clothing?: Total 6 Click Score: 10   End of Session    Activity Tolerance: Patient tolerated treatment well;Patient limited by pain Patient left: in bed;with call bell/phone within reach;with bed alarm set  OT Visit Diagnosis: Muscle  weakness (generalized) (M62.81)                Time: [id number] OT Time Calculation (min): 16 min Charges:  OT General Charges $OT Visit: 1 Procedure OT Evaluation $OT Eval Low Complexity: 1 Procedure OT Treatments $Self Care/Home Management : 8-22 mins (8') G-Codes: OT G-codes **NOT FOR INPATIENT CLASS** Functional Limitation: Self care Self Care Current Status ([id number]): At least 60 percent but less than 80 percent impaired, limited or restricted Self Care Goal Status ([id number]([id number]): At least 60 percent but less than 80 percent impaired, limited or restricted Self Care Discharge Status [phone]([id number]): At least 60 percent but less than 80 percent impaired, limited or restricted   Limmie PatriciaLaura Essenmacher, OTR/L,CBIS  [phone]   Essenmacher, Charisse MarchLaura D 03/07/2017, 10:49 AM

## 2017-03-07 NOTE — Progress Notes (Signed)
Patient ID: Alan CollumRobert C Briggs, male   DOB: 12/24/1921, 83 y.o.   MRN: [id number] I have conversed with a shoulder specialist regarding the patient's left proximal humerus fracture and he and I both agree that the patient should have nonoperative treatment and reassess the situation with x-ray in 2 weeks  We will go ahead and get a CT scan to better evaluate the fracture fragments but based on his medical history and what would be required to fix the fracture nonoperative treatment is the better option at this point

## 2017-03-07 NOTE — Progress Notes (Signed)
Physical Therapy Treatment Patient Details Name: Alan Briggs MRN: [id number] DOB: 1921/11/05 Today's Date: 03/07/2017    History of Present Illness 83 y.o. male with a past medical history significant for IDDM, SSS with pacer, hx of Afib not on anticoagulation due to recurrent falls, and OSA not on CPAP who presents with fall and L humerus fx. fracture.    PT Comments    Pt received in bed, another son present today.  Pt expressed he has increased pain today.  He continues to require Mod A for supine<>sit and insists that he is unable to use R UE to assist via pushing, that he must pull.  He insists that his son assist him with this despite PT's education on using his R UE to push forward. He required Min A for SPT bed<>chair going to the right.  He refuses to attempt to use the cane, stating that his R arm is too painful, and instead places weight through son's hand to perform transfer. He is not safe to return home alone, and will need SNF placement.  If this is not a possibility, he will need 24/7 supervision/assistance, a w/c, and HHPT/OT upon d/c.     Follow Up Recommendations  SNF     Equipment Recommendations  None recommended by PT    Recommendations for Other Services       Precautions / Restrictions Precautions Precautions: Fall Precaution Comments: 1-2 falls in the past 6 months.   Required Braces or Orthoses: Sling Restrictions Weight Bearing Restrictions: Yes LUE Weight Bearing: Non weight bearing    Mobility  Bed Mobility Overal bed mobility: Needs Assistance Bed Mobility: Supine to Sit     Supine to sit: HOB elevated;Mod assist     General bed mobility comments: Pt expresses that he is not able to use R UE to push with today because of pain in that arm as well, and continues to insist on pullin on his son to scoot to the EOB despite PT's education.  Transfers Overall transfer level: Needs assistance Equipment used: 1 person hand held assist Transfers:  Stand Pivot Transfers   Stand pivot transfers: Min assist       General transfer comment: Going to the right  Ambulation/Gait Ambulation/Gait assistance:  (Pt expressed that he was in too much pain to try and walk.  )               Stairs            Wheelchair Mobility    Modified Rankin (Stroke Patients Only)       Balance Overall balance assessment: History of Falls;Needs assistance Sitting-balance support: Single extremity supported;Feet supported Sitting balance-Leahy Scale: Good     Standing balance support: Single extremity supported Standing balance-Leahy Scale: Fair                              Cognition Arousal/Alertness: Awake/alert Behavior During Therapy: WFL for tasks assessed/performed Overall Cognitive Status: Within Functional Limits for tasks assessed                                        Exercises      General Comments        Pertinent Vitals/Pain Pain Assessment: 0-10 Pain Score: 5  Pain Location: L UE Pain Descriptors / Indicators: Aching Pain Intervention(s): Limited activity  within patient's tolerance;Monitored during session;Premedicated before session;Repositioned    Home Living                      Prior Function            PT Goals (current goals can now be found in the care plan section) Acute Rehab PT Goals Patient Stated Goal: Pt wants to get stronger and go home.  PT Goal Formulation: With patient/family Time For Goal Achievement: 03/20/17 Potential to Achieve Goals: Good Progress towards PT goals: Progressing toward goals    Frequency    Min 5X/week      PT Plan      Co-evaluation              AM-PAC PT "6 Clicks" Daily Activity  Outcome Measure  Difficulty turning over in bed (including adjusting bedclothes, sheets and blankets)?: A Lot Difficulty moving from lying on back to sitting on the side of the bed? : A Lot Difficulty sitting down on and  standing up from a chair with arms (e.g., wheelchair, bedside commode, etc,.)?: A Little Help needed moving to and from a bed to chair (including a wheelchair)?: A Little Help needed walking in hospital room?: A Lot Help needed climbing 3-5 steps with a railing? : A Lot 6 Click Score: 14    End of Session Equipment Utilized During Treatment: Gait belt;Other (comment) (L shoulder immobilizer. ) Activity Tolerance: Patient tolerated treatment well Patient left: in chair;with call bell/phone within reach;with family/visitor present Nurse Communication: Mobility status PT Visit Diagnosis: Unsteadiness on feet (R26.81);Other abnormalities of gait and mobility (R26.89);Muscle weakness (generalized) (M62.81);History of falling (Z91.81);Pain Pain - Right/Left: Left Pain - part of body: Shoulder;Arm     Time: 1130-1156 PT Time Calculation (min) (ACUTE ONLY): 26 min  Charges:  $Therapeutic Activity: 23-37 mins                    G Codes:       Beth Tayden Nichelson, PT, DPT X: [phone]

## 2017-03-07 NOTE — Progress Notes (Signed)
Subjective: LEFT PROXIMAL HUM FRACTURE   Objective: Vital signs in last 24 hours: Temp:  [98.4 F (36.9 C)] 98.4 F (36.9 C) (05/21 0556) Pulse Rate:  [58] 58 (05/21 0556) Resp:  [18] 18 (05/21 0556) BP: (127)/(64) 127/64 (05/21 0556) SpO2:  [93 %-98 %] 98 % (05/21 0556)  Intake/Output from previous day: 05/20 0701 - 05/21 0700 In: 410 [P.O.:360; I.V.:50] Out: 600 [Urine:600] Intake/Output this shift: No intake/output data recorded.   Recent Labs  03/05/17 2237 03/06/17 0749  HGB 13.8 12.4*    Recent Labs  03/05/17 2237 03/06/17 0749  WBC 13.4* 10.2  RBC 4.16* 3.71*  HCT 41.2 36.7*  PLT 146* 138*    Recent Labs  03/05/17 2237 03/06/17 0749  NA 134* 136  K 5.1 5.0  CL 98* 101  CO2 26 27  BUN 26* 31*  CREATININE 1.18 1.09  GLUCOSE 206* 194*  CALCIUM 8.9 8.4*   No results for input(s): LABPT, INR in the last 72 hours.   Assessment/Plan: I WILL CONSULT SHOULDER SUBSPECIALIST AND SEE IF SURGERY WARRANTED    Alan Briggs 03/07/2017, 8:16 AM

## 2017-03-08 ENCOUNTER — Inpatient Hospital Stay (HOSPITAL_COMMUNITY): Payer: Medicare Other

## 2017-03-08 DIAGNOSIS — S42202A Unspecified fracture of upper end of left humerus, initial encounter for closed fracture: Secondary | ICD-10-CM

## 2017-03-08 LAB — GLUCOSE, CAPILLARY
GLUCOSE-CAPILLARY: 158 mg/dL — AB (ref 65–99)
GLUCOSE-CAPILLARY: 192 mg/dL — AB (ref 65–99)

## 2017-03-08 LAB — CK: Total CK: 226 U/L (ref 49–397)

## 2017-03-08 LAB — BASIC METABOLIC PANEL
ANION GAP: 6 (ref 5–15)
BUN: 26 mg/dL — ABNORMAL HIGH (ref 6–20)
CHLORIDE: 102 mmol/L (ref 101–111)
CO2: 26 mmol/L (ref 22–32)
CREATININE: 0.78 mg/dL (ref 0.61–1.24)
Calcium: 8.1 mg/dL — ABNORMAL LOW (ref 8.9–10.3)
GFR calc non Af Amer: >60 mL/min (ref >60)
Glucose, Bld: 147 mg/dL — ABNORMAL HIGH (ref 65–99)
Potassium: 4 mmol/L (ref 3.5–5.1)
SODIUM: 134 mmol/L — AB (ref 135–145)

## 2017-03-08 MED ORDER — OXYCODONE HCL 5 MG PO TABS: 2.5000 mg | ORAL_TABLET | ORAL | 0 refills | Status: DC | PRN

## 2017-03-08 MED ORDER — INSULIN GLARGINE 100 UNIT/ML ~~LOC~~ SOLN
30.0000 [IU] | Freq: Every day | SUBCUTANEOUS | 11 refills | Status: DC
Start: 2017-03-08 — End: 2017-04-26

## 2017-03-08 MED ORDER — POLYETHYLENE GLYCOL 3350 17 G PO PACK: 17.0000 g | PACK | Freq: Every day | ORAL | 0 refills | Status: DC | PRN

## 2017-03-08 NOTE — Plan of Care (Signed)
Problem: Pain Managment: Goal: General experience of comfort will improve Outcome: Progressing Pt continues to have pain in L shoulder d/t fall. Pt receiving Oxycodone q4h as well as scheduled Tylenol with relief. Pt repositioned q2h to make comfortable as well as prevent further skin breakdown. Will continue to monitor pt.

## 2017-03-08 NOTE — Discharge Summary (Addendum)
Physician Discharge Summary  Alan CollumRobert C Briggs WUJ:[id number] DOB: 06-09-1922 DOA: 03/05/2017  PCP: Alan Briggs, Alan Z, MD  Admit date: 03/05/2017 Discharge date: 03/08/2017  Admitted From: Home  Disposition:   SNF   Recommendations for Outpatient Follow-up:  1. See facility MD at first visit 2. Therapy per facility protocol 3. Please obtain BMP/CBC in one week 4. Follow up with Dr. Romeo Briggs within 2 weeks  Home Health:No Equipment/Devices: None  Discharge Condition: Stable CODE STATUS: Full Code Diet recommendation: Heart Healthy / Carb Modified  Brief/Interim Summary: Alan Briggs a 83 y.o.malewith a past medical history significant for IDDM, SSS with pacer, hx of Afib not on anticoagulation due to recurrent falls, and OSA not on CPAPwho presents with fall and arm fracture.  The patient lives alone, still drives, and manages all of his own affairs. He was in his usual state of health until this afternoon around 1 PM, he was coming back to the house from running some errands, was walking up the stairs in his carport, when he lost his balance, fell backwards down a few stairs landing on concrete on his left side. He had immediate pain in his left arm and could not get up. He laid there on the floor for approximately 8 hours until his [place] and found him and called EMS. He had no prodrome, dizziness, lightheadedness, LOC. He had no chest pain, palpitations. He just lost his balance.  Patient was evaluated by orthopedic surgery, Dr. Romeo Briggs.  Dr. Romeo Briggs discussed with shoulder specialist whether or not patient was a candidate for shoulder surgery.  Bother Dr. Romeo Briggs and the shoulder orthopedic surgeon did not believe patient was a candidate for shoulder surgery.  PT and OT evaluated patient during hospitalization and found him to be appropriate for SNF and a significant risk if he were to discharge home.  CSW and CM were consulted and family ultimately decided  for patient to go to SNF for rehab.  Patient was stable for discharge on 03/08/17.   Discharge Diagnoses:  Principal Problem:   Humerus fracture Active Problems:   Pacemaker   Chronic diastolic heart failure (HCC)   Chronic obstructive pulmonary disease (HCC)   Controlled diabetes mellitus type 2 with complications (HCC)   Essential hypertension   AKI (acute kidney injury) [facility](HCC)    Discharge Instructions  Discharge Instructions    Call MD for:  difficulty breathing, headache or visual disturbances    Complete by:  As directed    Call MD for:  extreme fatigue    Complete by:  As directed    Call MD for:  hives    Complete by:  As directed    Call MD for:  persistant dizziness or light-headedness    Complete by:  As directed    Call MD for:  persistant nausea and vomiting    Complete by:  As directed    Call MD for:  severe uncontrolled pain    Complete by:  As directed    Call MD for:  temperature >100.4    Complete by:  As directed    Diet - low sodium heart healthy    Complete by:  As directed    Discharge instructions    Complete by:  As directed    Use wheelchair to move Follow up with Dr. Romeo Briggs within 2 weeks   Increase activity slowly    Complete by:  As directed      Allergies as of 03/08/2017  Reactions   Advair Diskus [fluticasone-salmeterol] Other (See Comments)   Reaction:  Unknown    Combivent [ipratropium-albuterol] Other (See Comments)   Reaction:  Unknown    Lotensin Hct [benazepril-hydrochlorothiazide] Other (See Comments)   Reaction:  Unknown    Penicillins Swelling, Other (See Comments)   Reaction:  Facial swelling Has patient had a PCN reaction causing immediate rash, facial/tongue/throat swelling, SOB or lightheadedness with hypotension: Yes Has patient had a PCN reaction causing severe rash involving mucus membranes or skin necrosis: No Has patient had a PCN reaction that required hospitalization No Has patient had a PCN reaction  occurring within the last 10 years: No If all of the above answers are "NO", then may proceed with Cephalosporin use.   Simvastatin Other (See Comments)   Reaction:  Muscle pain       Medication List    TAKE these medications   acetaminophen 500 MG tablet Commonly known as:  TYLENOL Take 1,000 mg by mouth every 6 (six) hours as needed for mild pain, moderate pain or headache.   aspirin EC 81 MG tablet Take 81 mg by mouth daily.   atenolol 50 MG tablet Commonly known as:  TENORMIN Take 25 mg by mouth daily.   furosemide 20 MG tablet Commonly known as:  LASIX Take 20 mg by mouth daily.   ICAPS AREDS 2 Caps Take 1 capsule by mouth daily.   insulin glargine 100 UNIT/ML injection Commonly known as:  LANTUS Inject 0.3 mLs (30 Units total) into the skin at bedtime. What changed:  how much to take   insulin lispro 100 UNIT/ML KiwkPen Generic drug:  insulin lispro Inject 12-13 Units into the skin daily.   multivitamin with minerals Tabs tablet Take 1 tablet by mouth daily.   oxyCODONE 5 MG immediate release tablet Commonly known as:  Oxy IR/ROXICODONE Take 0.5-1 tablets (2.5-5 mg total) by mouth every 4 (four) hours as needed for moderate pain.   polyethylene glycol packet Commonly known as:  MIRALAX / GLYCOLAX Take 17 g by mouth daily as needed for mild constipation.            Durable Medical Equipment        Start     Ordered   03/08/17 1552  For home use only DME Wheelchair electric  (Wheelchairs)  Once     03/08/17 1552   03/08/17 1552  DME tub bench  Once     03/08/17 1552   03/08/17 1552  DME 3-in-1  Once     03/08/17 1552   03/08/17 1436  For home use only DME standard manual wheelchair with seat cushion  Once    Comments:  Patient suffers from humerus fx which impairs their ability to perform daily activities like ambulate in the home.  A walker will not resolve  issue with performing activities of daily living. A wheelchair will allow patient to  safely perform daily activities. Patient can safely propel the wheelchair in the home or has a caregiver who can provide assistance.  Accessories: elevating leg rests (ELRs), wheel locks, extensions and anti-tippers.  May need heavy duty wheelchair   03/08/17 1436     Follow-up Information    Alan Stabile, MD. Schedule an appointment as soon as possible for a visit in 1 week(s).   Specialty:  Internal Medicine Contact information: [address] [place] [address] [phone]        Vickki Hearing, MD. Schedule an appointment as soon as possible  for a visit in 2 week(s).   Specialties:  Orthopedic Surgery, Radiology Contact information: [address] [place] [address] [phone]          Allergies  Allergen Reactions  . Advair Diskus [Fluticasone-Salmeterol] Other (See Comments)    Reaction:  Unknown   . Combivent [Ipratropium-Albuterol] Other (See Comments)    Reaction:  Unknown   . Lotensin Hct [Benazepril-Hydrochlorothiazide] Other (See Comments)    Reaction:  Unknown   . Penicillins Swelling and Other (See Comments)    Reaction:  Facial swelling Has patient had a PCN reaction causing immediate rash, facial/tongue/throat swelling, SOB or lightheadedness with hypotension: Yes Has patient had a PCN reaction causing severe rash involving mucus membranes or skin necrosis: No Has patient had a PCN reaction that required hospitalization No Has patient had a PCN reaction occurring within the last 10 years: No If all of the above answers are "NO", then may proceed with Cephalosporin use.  . Simvastatin Other (See Comments)    Reaction:  Muscle pain     Consultations:  Orthopedic Surgery  CM  CSW  Procedures/Studies: Dg Chest 1 View  Result Date: 03/06/2017 CLINICAL DATA:  Fall today in car port. Left upper extremity pain most prominent at the shoulder with multiple skin tears. EXAM: CHEST 1 VIEW COMPARISON:  Radiographs 04/20/2016  FINDINGS: Left-sided pacemaker in place, leads projecting over the right atrium and ventricle.The cardiomediastinal contours are unchanged with stable cardiomegaly. Pulmonary vasculature is normal. No consolidation, pleural effusion, or pneumothorax. No acute osseous abnormalities are seen. Left proximal humerus fracture not well visualized. IMPRESSION: Stable cardiomegaly.  No evidence of acute or traumatic abnormality. Electronically Signed   By: Rubye Oaks M.D.   On: 03/06/2017 00:32   Dg Elbow Complete Left  Result Date: 03/06/2017 CLINICAL DATA:  Fall today in car port. Left upper extremity pain most prominent at the shoulder with multiple skin tears. EXAM: LEFT ELBOW - COMPLETE 3+ VIEW COMPARISON:  None. FINDINGS: There is no evidence of fracture, dislocation, or joint effusion. Soft tissue air in the medial soft tissues. No radiopaque foreign body. IMPRESSION: 1. No fracture or subluxation. 2. Soft tissue air in the medial soft tissues suggests laceration. No radiopaque foreign body. Electronically Signed   By: Rubye Oaks M.D.   On: 03/06/2017 00:30   Dg Wrist Complete Left  Result Date: 03/06/2017 CLINICAL DATA:  Fall today in car port. Left upper extremity pain most prominent at the shoulder with multiple skin tears. EXAM: LEFT WRIST - COMPLETE 3+ VIEW COMPARISON:  None. FINDINGS: There is no evidence of fracture or dislocation. Moderate radiocarpal osteoarthritis. Advanced first carpometacarpal osteoarthritis. Scaphoid is intact. Mild soft tissue edema. IMPRESSION: 1. No fracture or dislocation. 2. Osteoarthritis, prominent at the base of the thumb. Electronically Signed   By: Rubye Oaks M.D.   On: 03/06/2017 00:31   Ct Head Wo Contrast  Result Date: 03/06/2017 CLINICAL DATA:  Fall today with head injury. EXAM: CT HEAD WITHOUT CONTRAST TECHNIQUE: Contiguous axial images were obtained from the base of the skull through the vertex without intravenous contrast. COMPARISON:  None.  FINDINGS: Brain: Generalized cerebral and cerebellar atrophy. Remote lacunar infarct in the left caudate. Mild for age chronic small vessel ischemic change. No intracranial hemorrhage, subdural or extra-axial fluid collection. No mass effect or midline shift. Vascular: Atherosclerosis of skullbase vasculature without hyperdense vessel or abnormal calcification. Skull: No skull fracture.  No focal lesion. Sinuses/Orbits: Paranasal sinuses and mastoid air cells are clear. The  visualized orbits are unremarkable. Bilateral cataract resection. Other: None. IMPRESSION: 1.  No acute intracranial abnormality. 2. Atrophy and chronic small vessel ischemia. Remote lacunar infarct in the left caudate. Electronically Signed   By: Rubye Oaks M.D.   On: 03/06/2017 00:11   Dg Shoulder Left  Result Date: 03/06/2017 CLINICAL DATA:  Larey Seat yesterday, pt in left shoulder immobilizer with proximal humerus fx, no Y-view performed on 03/05/17 EXAM: LEFT SHOULDER - 2+ VIEW COMPARISON:  03/05/2017 FINDINGS: Comminuted fracture of the proximal left humerus is again noted. The humeral head component remains normally aligned with the glenoid. The shaft fracture component is displaced anteriorly and medially by just over 2 cm. There is surrounding soft tissue swelling. Bones are diffusely demineralized. IMPRESSION: 1. Comminuted proximal left humeral fracture appears less displaced than on the previous day's study. There is no dislocation. Humeral head is normally aligned with the glenoid. Electronically Signed   By: Amie Portland M.D.   On: 03/06/2017 08:44   Dg Shoulder Left  Result Date: 03/06/2017 CLINICAL DATA:  Fall today in car port. Left upper extremity pain most prominent at the shoulder with multiple skin tears. EXAM: LEFT SHOULDER - 2+ VIEW COMPARISON:  None. FINDINGS: Comminuted displaced fracture of the humeral head and neck. Humeral shaft displaced medially. Dominant fracture line just inferior to the glenohumeral  joint. There is comminuted left displaced involvement of the lateral head. Acromioclavicular joint is congruent. IMPRESSION: Comminuted displaced fracture of the humeral head/neck. Electronically Signed   By: Rubye Oaks M.D.   On: 03/06/2017 00:29   Dg Humerus Left  Result Date: 03/06/2017 CLINICAL DATA:  Fall today in car port. Left upper extremity pain most prominent at the shoulder with multiple skin tears. EXAM: LEFT HUMERUS - 2+ VIEW COMPARISON:  None. FINDINGS: Comminuted fracture of the humeral neck and lateral humeral head. There is displacement of the dominant humeral shaft fragment 2.5 cm medially. Fracture line appears just inferior to the glenohumeral joint. The distal humerus is intact. Soft tissue edema noted about the elbow. IMPRESSION: Comminuted displaced humeral neck and lateral head fracture. Fracture appears just inferior to the glenohumeral joint. Electronically Signed   By: Rubye Oaks M.D.   On: 03/06/2017 00:28      Subjective: Patient awake and sitting up in chair.  States he would like to stay in the hospital as long as his insurance would allow as he is nervous to go home.  Understands recommendations regarding nonsurgical treatment as well as skilled rehab in a facility.  Discharge Exam: Vitals:   03/07/17 2209 03/08/17 0508  BP: 116/62 (!) 167/64  Pulse: 66 71  Resp: 18 18  Temp: 98.4 F (36.9 C) 98.4 F (36.9 C)   Vitals:   03/07/17 0556 03/07/17 1632 03/07/17 2209 03/08/17 0508  BP: 127/64 118/63 116/62 (!) 167/64  Pulse: (!) 58 61 66 71  Resp: 18 18 18 18   Temp: 98.4 F (36.9 C) 98.5 F (36.9 C) 98.4 F (36.9 C) 98.4 F (36.9 C)  TempSrc: Oral Oral Oral Oral  SpO2: 98% 97% 98% 98%  Weight:      Height:        General: Pt is alert, awake, not in acute distress Cardiovascular: RRR, S1/S2 +, no rubs, no gallops Respiratory: CTA bilaterally, no wheezing, no rhonchi Abdominal: Soft, obese, NT, ND, bowel sounds + Extremities: no edema, no  cyanosis, left arm in a swath sling with fingers and hand neurovascularly intact, scattered ecchymoses on arms bilaterally  The results of significant diagnostics from this hospitalization (including imaging, microbiology, ancillary and laboratory) are listed below for reference.     Microbiology: No results found for this or any previous visit (from the past 240 hour(s)).   Labs: BNP (last 3 results)  Recent Labs  04/20/16 0130  BNP 254.0*   Basic Metabolic Panel:  Recent Labs Lab 03/05/17 2237 03/06/17 0749 03/08/17 0431  NA 134* 136 134*  K 5.1 5.0 4.0  CL 98* 101 102  CO2 26 27 26   GLUCOSE 206* 194* 147*  BUN 26* 31* 26*  CREATININE 1.18 1.09 0.78  CALCIUM 8.9 8.4* 8.1*   Liver Function Tests: No results for input(s): AST, ALT, ALKPHOS, BILITOT, PROT, ALBUMIN in the last 168 hours. No results for input(s): LIPASE, AMYLASE in the last 168 hours. No results for input(s): AMMONIA in the last 168 hours. CBC:  Recent Labs Lab 03/05/17 2237 03/06/17 0749  WBC 13.4* 10.2  NEUTROABS 10.6*  --   HGB 13.8 12.4*  HCT 41.2 36.7*  MCV 99.0 98.9  PLT 146* 138*   Cardiac Enzymes:  Recent Labs Lab 03/05/17 2237 03/06/17 0749 03/08/17 0431  CKTOTAL 760* 719* 226   BNP: Invalid input(s): POCBNP CBG:  Recent Labs Lab 03/07/17 1107 03/07/17 1620 03/07/17 2020 03/08/17 0718 03/08/17 1122  GLUCAP 207* 163* 168* 158* 192*   D-Dimer No results for input(s): DDIMER in the last 72 hours. Hgb A1c No results for input(s): HGBA1C in the last 72 hours. Lipid Profile No results for input(s): CHOL, HDL, LDLCALC, TRIG, CHOLHDL, LDLDIRECT in the last 72 hours. Thyroid function studies No results for input(s): TSH, T4TOTAL, T3FREE, THYROIDAB in the last 72 hours.  Invalid input(s): FREET3 Anemia work up No results for input(s): VITAMINB12, FOLATE, FERRITIN, TIBC, IRON, RETICCTPCT in the last 72 hours. Urinalysis    Component Value Date/Time   COLORURINE  AMBER (A) 04/20/2016 0130   APPEARANCEUR CLEAR 04/20/2016 0130   LABSPEC 1.025 04/20/2016 0130   PHURINE 5.0 04/20/2016 0130   GLUCOSEU NEGATIVE 04/20/2016 0130   HGBUR MODERATE (A) 04/20/2016 0130   BILIRUBINUR NEGATIVE 04/20/2016 0130   KETONESUR NEGATIVE 04/20/2016 0130   PROTEINUR NEGATIVE 04/20/2016 0130   NITRITE NEGATIVE 04/20/2016 0130   LEUKOCYTESUR TRACE (A) 04/20/2016 0130   Sepsis Labs Invalid input(s): PROCALCITONIN,  WBC,  LACTICIDVEN Microbiology No results found for this or any previous visit (from the past 240 hour(s)).   Time coordinating discharge: 45 minutes  SIGNED:   Katrinka Blazing, MD  [facility] 03/08/2017, 4:00 PM Pager [phone] If 7PM-7AM, please contact night-coverage [email or web address] Password [email or web address]

## 2017-03-08 NOTE — Care Management Important Message (Signed)
Important Message  Patient Details  Name: Cora CollumRobert C Heckman MRN: [id number] Date of Birth: February 10, 1922   Medicare Important Message Given:  Yes    Malcolm MetroChildress, Tyyonna Soucy Demske, RN 03/08/2017, 1:12 PM

## 2017-03-08 NOTE — Care Management (Signed)
    Durable Medical Equipment        Start     Ordered   03/08/17 1436  For home use only DME standard manual wheelchair with seat cushion  Once    Comments:  Patient suffers from humerus fx which impairs their ability to perform daily activities like ambulate in the home.  A walker will not resolve  issue with performing activities of daily living. A wheelchair will allow patient to safely perform daily activities. Patient can safely propel the wheelchair in the home or has a caregiver who can provide assistance.  Accessories: elevating leg rests (ELRs), wheel locks, extensions and anti-tippers.  May need heavy duty wheelchair   03/08/17 1436

## 2017-03-08 NOTE — Progress Notes (Signed)
Patient is to be discharged to [facility] in stable condition. [facility] called [facility] and patient will be transported there by son.  Quita SkyeMorgan P Dishmon, RN

## 2017-03-08 NOTE — Care Management Note (Signed)
Case Management Note  Patient Details  Name: Alan Briggs MRN: [id number] Date of Birth: 09/02/1922  Subjective/Objective:                  Admitted with humerus fx. Pt from home, ind with ADL's at baseline, uses walker. Will now be WC bound due to not being able to bear weight onto that walker. PT has recommended SNF, pt does not hae 3 day inpt stay. Willing to pay OOP at this time.   Action/Plan: Discharging home today to SNF. CSW making arrangement for placement. No CM needs at this time.   Expected Discharge Date:      03/08/2017            Expected Discharge Plan:  Skilled Nursing Facility  In-House Referral:  Clinical Social Work  Discharge planning Services  CM Consult  Post Acute Care Choice:  NA Choice offered to:  NA  Status of Service:  Completed, signed off  If discussed at Long Length of Stay Meetings, dates discussed:    Additional Comments:  Malcolm MetroChildress, Elizaveta Mattice Demske, RN 03/08/2017, 1:13 PM

## 2017-03-08 NOTE — Clinical Social Work Placement (Addendum)
   CLINICAL SOCIAL WORK PLACEMENT  NOTE  Date:  03/08/2017  Patient Details  Name: Alan Briggs MRN: [id number] Date of Birth: Jan 16, 1922  Clinical Social Work is seeking post-discharge placement for this patient at the Skilled  Nursing Facility level of care (*CSW will initial, date and re-position this form in  chart as items are completed):  Yes   Patient/family provided with [facility] Clinical Social Work Department's list of facilities offering this level of care within the geographic area requested by the patient (or if unable, by the patient's family).  Yes   Patient/family informed of their freedom to choose among providers that offer the needed level of care, that participate in Medicare, Medicaid or managed care program needed by the patient, have an available bed and are willing to accept the patient.  Yes   Patient/family informed of [facility]'s ownership interest in [facility] and [facility], as well as of the fact that they are under no obligation to receive care at these facilities.  PASRR submitted to EDS on       PASRR number received on       Existing PASRR number confirmed on 03/08/17     FL2 transmitted to all facilities in geographic area requested by pt/family on 03/08/17     FL2 transmitted to all facilities within larger geographic area on       Patient informed that his/her managed care company has contracts with or will negotiate with certain facilities, including the following:            Patient/family informed of bed offers received.  03/08/2017   [facility]     [facility]  Physician [facility] and patient [facility]       [name]   [facility]  .   SNF  Patient to be transferred to facility by       EMS  Patient family notified on   of transfer.  Son  Name of family member notified:        [name]  PHYSICIAN       Additional Comment:     _______________________________________________ Annice NeedySettle, Heather D, LCSW 03/08/2017, 1:29 PM

## 2017-03-08 NOTE — Clinical Social Work Note (Signed)
Clinical Social Work Assessment  Patient Details  Name: Alan Briggs MRN: [id number] Date of Birth: [birth year]  Date of referral:  03/08/17               Reason for consult:  Discharge Planning                Permission sought to share information with:    Permission granted to share information::     Name::        Agency::     Relationship::     Contact Information:  Son at bedside  Housing/Transportation Living arrangements for the past 2 months:  Single Family Home Source of Information:  Patient, Adult Children Patient Interpreter Needed:  None Criminal Activity/Legal Involvement Pertinent to Current Situation/Hospitalization:  No - Comment as needed Significant Relationships:  Adult Children Lives with:  Self Do you feel safe going back to the place where you live?  No Need for family participation in patient care:  Yes (Comment)  Care giving concerns:  None identified at baseline, however since patient's fall family is concerned about safety at home.    Social Worker assessment / plan:  Patient lives alone, walks with a walker and is independent in ADLs at baseline. Patient and family are interested in SNF but understand that patient does not have three night stay required for Medicare to pay for SNF.  Family is considering private pay. Family gave permission for patient to be faxed out. LCSW also provided daily rates for facilities as they will be private pay.   Employment status:  Retired Health and safety inspectornsurance information:  Medicare PT Recommendations:  Skilled Nursing Facility Information / Referral to community resources:  Skilled Nursing Facility  Patient/Family's Response to care:  Patient and family are considering private pay SNF.   Patient/Family's Understanding of and Emotional Response to Diagnosis, Current Treatment, and Prognosis:  Patient and family understand patient's diagnosis, treatment and prognosis.    Emotional Assessment Appearance:  Appears stated  age Attitude/Demeanor/Rapport:   (Cooperative ) Affect (typically observed):  Accepting, Calm Orientation:  Oriented to Self, Oriented to Place, Oriented to  Time, Oriented to Situation Alcohol / Substance use:  Not Applicable Psych involvement (Current and /or in the community):     Discharge Needs  Concerns to be addressed:  Discharge Planning Concerns Readmission within the last 30 days:  No Current discharge risk:  Lives alone Barriers to Discharge:  Other (Does not have three night stay)   Annice NeedySettle, [name], LCSW 03/08/2017, 1:19 PM

## 2017-03-08 NOTE — Progress Notes (Addendum)
LCSW assisting with discharge planning:  New SNF, [facility] (bed choice)  Discussed plans with son Alan AshCarl via phone [phone]) regarding discharge today and paying out of pocket. Son given rate and payment upfront for 30 days as required by facility.  Discussed his therapy would be billed under Medicare part B.  Son reports that patient is unable to pay 30 days upfront and does not have access to monies at this time to pay.  Reports patient has full control over his finances and is unable to move money or go to bank in effort to pay for placement.  LCSW discussed with son next steps and plans and he reports he and family will figure out other options and are aware of discharge. Discussed option of [facility] following up with family with regards to placement. He was agreeable.  [facility] was [facility]. CM was updated on plan of care and will update MD.   No CSW needs warranted at this time per son.  Available if plans change.  2:49 PM LCSW followed up with son again regarding discharge plan. Son confirms that patient is unable to have funds to admit to the SNF today, but after his doctor's appointment at 11am tomorrow, patient can have fund and admit on 03/09/17 to SNF. LCSW confirmed with Thayer OhmChris (admissions with [facility] that this was acceptable in which he agrees). Son at home now retrieving clothing for patient and planning to discharge/pick patient up and return home. CM aware of plan.  4:03 PM CM reports patient is going to SNF tonight. All information sent over via HUB:  [facility] working with facility at this time regarding payment and plans.  No other needs at this time.   Deretha EmoryHannah Dalayza Zambrana LCSW, MSW Clinical Social Work: Optician, dispensingystem Wide Float Coverage for :  [phone]

## 2017-03-08 NOTE — NC FL2 (Signed)
[place] MEDICAID FL2 LEVEL OF CARE SCREENING TOOL     IDENTIFICATION  Patient Name: Alan Briggs Birthdate: 04-24-1922 Sex: male Admission Date (Current Location): 03/05/2017  [facility] and [place] Number:  [employer] and Address:  [facility],  [address], [place] [address]      Provider Number: [id number]  Attending Physician Name and Address:  Filbert Schilder, MD  Relative Name and Phone Number:       Current Level of Care: Hospital Recommended Level of Care: Skilled Nursing Facility Prior Approval Number:    Date Approved/Denied:   PASRR Number: [id number] ([id number] )  Discharge Plan: SNF    Current Diagnoses: Patient Active Problem List   Diagnosis Date Noted  . Humerus fracture 03/06/2017  . Essential hypertension 03/06/2017  . AKI (acute kidney injury) (HCC) 03/06/2017  . Ileus (HCC) 04/30/2016  . Acute renal failure (HCC)   . Pancreatic mass   . Sepsis, unspecified organism (HCC)   . Chronic obstructive pulmonary disease (HCC)   . OSA (obstructive sleep apnea)   . Controlled diabetes mellitus type 2 with complications (HCC)   . Sick sinus syndrome (HCC)   . Presence of permanent cardiac pacemaker   . Foot laceration   . Dislocation of fifth toe, right, closed   . Abdominal pain 04/17/2016  . UTI (lower urinary tract infection) 04/17/2016  . Pacemaker 09/30/2015  . Chronic diastolic heart failure (HCC) 09/30/2015  . Venous insufficiency 09/30/2015    Orientation RESPIRATION BLADDER Height & Weight     Self, Time, Place, Situation  Normal Continent Weight: 263 lb 7.2 oz (119.5 kg) Height:  6' (182.9 cm)  BEHAVIORAL SYMPTOMS/MOOD NEUROLOGICAL BOWEL NUTRITION STATUS      Continent Diet (Heart Healthy/Carb modified)  AMBULATORY STATUS COMMUNICATION OF NEEDS Skin   Limited Assist Verbally Other (Comment) (laceration left elbow)                       Personal Care Assistance Level of Assistance  Bathing,  Dressing, Feeding Bathing Assistance: Limited assistance Feeding assistance: Independent Dressing Assistance: Limited assistance     Functional Limitations Info  Sight, Hearing, Speech Sight Info: Adequate Hearing Info: Adequate Speech Info: Adequate    SPECIAL CARE FACTORS FREQUENCY  PT (By licensed PT)     PT Frequency: 5x/week              Contractures Contractures Info: Not present    Additional Factors Info  Code Status, Allergies Code Status Info: Full Code Allergies Info: Advair Diskus, Combivent, Lotensin Hcl, Penicillins, Simvastatin           Current Medications (03/08/2017):  This is the current hospital active medication list Current Facility-Administered Medications  Medication Dose Route Frequency Provider Last Rate Last Dose  . acetaminophen (TYLENOL) tablet 1,000 mg  1,000 mg Oral TID Alan Sam, MD   1,000 mg at 03/08/17 1006  . aspirin EC tablet 81 mg  81 mg Oral Daily Alan Sam, MD   81 mg at 03/08/17 1006  . atenolol (TENORMIN) tablet 25 mg  25 mg Oral Daily Alan Briggs, Alan Lites, MD   25 mg at 03/08/17 1006  . enoxaparin (LOVENOX) injection 40 mg  40 mg Subcutaneous Q24H Alan Briggs, Alan Lites, MD   40 mg at 03/08/17 1006  . feeding supplement (GLUCERNA SHAKE) (GLUCERNA SHAKE) liquid 237 mL  237 mL Oral QPC breakfast Alan Sam, MD   237 mL at 03/08/17  1005  . insulin aspart (novoLOG) injection 0-5 Units  0-5 Units Subcutaneous QHS Alan Briggs, Alan P, MD      . insulin aspart (novoLOG) injection 0-9 Units  0-9 Units Subcutaneous TID WC Alan Briggs, Alan P, MD      . insulin glargine (LANTUS) injection 25 Units  25 Units Subcutaneous QHS Alan Briggs, Alan P, MD   25 Units at 03/07/17 2133  . ondansetron (ZOFRAN) tablet 4 mg  4 mg Oral Q6H PRN Alan Briggs, Alan Liteshristopher P, MD       Or  . ondansetron (ZOFRAN) injection 4 mg  4 mg Intravenous Q6H PRN Alan Briggs, Alan Liteshristopher P, MD      . oxyCODONE (Oxy IR/ROXICODONE)  immediate release tablet 2.5-5 mg  2.5-5 mg Oral Q4H PRN Alan Briggs, Alan P, MD   5 mg at 03/08/17 1006  . polyethylene glycol (MIRALAX / GLYCOLAX) packet 17 g  17 g Oral Daily PRN Alan Briggs, Alan Liteshristopher P, MD         Discharge Medications: Please see discharge summary for a list of discharge medications.  Relevant Imaging Results:  Relevant Lab Results:   Additional Information SSN: [id number]  Alan Briggs, Alan ChinaHeather D, LCSW

## 2017-03-09 DIAGNOSIS — S42209D Unspecified fracture of upper end of unspecified humerus, subsequent encounter for fracture with routine healing: Secondary | ICD-10-CM | POA: Diagnosis not present

## 2017-03-09 DIAGNOSIS — M6281 Muscle weakness (generalized): Secondary | ICD-10-CM | POA: Diagnosis not present

## 2017-03-09 DIAGNOSIS — Z23 Encounter for immunization: Secondary | ICD-10-CM | POA: Diagnosis not present

## 2017-03-09 DIAGNOSIS — Z6836 Body mass index (BMI) 36.0-36.9, adult: Secondary | ICD-10-CM | POA: Diagnosis not present

## 2017-03-09 DIAGNOSIS — Z7189 Other specified counseling: Secondary | ICD-10-CM | POA: Diagnosis not present

## 2017-03-09 DIAGNOSIS — R269 Unspecified abnormalities of gait and mobility: Secondary | ICD-10-CM | POA: Diagnosis not present

## 2017-03-09 LAB — GLUCOSE, CAPILLARY: GLUCOSE-CAPILLARY: 154 mg/dL — AB (ref 65–99)

## 2017-03-10 DIAGNOSIS — M6281 Muscle weakness (generalized): Secondary | ICD-10-CM | POA: Diagnosis not present

## 2017-03-10 DIAGNOSIS — R269 Unspecified abnormalities of gait and mobility: Secondary | ICD-10-CM | POA: Diagnosis not present

## 2017-03-11 DIAGNOSIS — R269 Unspecified abnormalities of gait and mobility: Secondary | ICD-10-CM | POA: Diagnosis not present

## 2017-03-11 DIAGNOSIS — M6281 Muscle weakness (generalized): Secondary | ICD-10-CM | POA: Diagnosis not present

## 2017-03-12 DIAGNOSIS — I10 Essential (primary) hypertension: Secondary | ICD-10-CM | POA: Diagnosis not present

## 2017-03-12 DIAGNOSIS — E118 Type 2 diabetes mellitus with unspecified complications: Secondary | ICD-10-CM | POA: Diagnosis not present

## 2017-03-12 DIAGNOSIS — S42202D Unspecified fracture of upper end of left humerus, subsequent encounter for fracture with routine healing: Secondary | ICD-10-CM | POA: Diagnosis not present

## 2017-03-12 DIAGNOSIS — J449 Chronic obstructive pulmonary disease, unspecified: Secondary | ICD-10-CM | POA: Diagnosis not present

## 2017-03-14 DIAGNOSIS — M6281 Muscle weakness (generalized): Secondary | ICD-10-CM | POA: Diagnosis not present

## 2017-03-14 DIAGNOSIS — R269 Unspecified abnormalities of gait and mobility: Secondary | ICD-10-CM | POA: Diagnosis not present

## 2017-03-15 DIAGNOSIS — M6281 Muscle weakness (generalized): Secondary | ICD-10-CM | POA: Diagnosis not present

## 2017-03-15 DIAGNOSIS — R269 Unspecified abnormalities of gait and mobility: Secondary | ICD-10-CM | POA: Diagnosis not present

## 2017-03-16 DIAGNOSIS — I10 Essential (primary) hypertension: Secondary | ICD-10-CM | POA: Diagnosis not present

## 2017-03-16 DIAGNOSIS — S42202D Unspecified fracture of upper end of left humerus, subsequent encounter for fracture with routine healing: Secondary | ICD-10-CM | POA: Diagnosis not present

## 2017-03-16 DIAGNOSIS — I5023 Acute on chronic systolic (congestive) heart failure: Secondary | ICD-10-CM | POA: Diagnosis not present

## 2017-03-16 DIAGNOSIS — R269 Unspecified abnormalities of gait and mobility: Secondary | ICD-10-CM | POA: Diagnosis not present

## 2017-03-16 DIAGNOSIS — M6281 Muscle weakness (generalized): Secondary | ICD-10-CM | POA: Diagnosis not present

## 2017-03-16 DIAGNOSIS — J449 Chronic obstructive pulmonary disease, unspecified: Secondary | ICD-10-CM | POA: Diagnosis not present

## 2017-03-17 DIAGNOSIS — R269 Unspecified abnormalities of gait and mobility: Secondary | ICD-10-CM | POA: Diagnosis not present

## 2017-03-17 DIAGNOSIS — M6281 Muscle weakness (generalized): Secondary | ICD-10-CM | POA: Diagnosis not present

## 2017-03-18 DIAGNOSIS — S42202D Unspecified fracture of upper end of left humerus, subsequent encounter for fracture with routine healing: Secondary | ICD-10-CM | POA: Diagnosis not present

## 2017-03-18 DIAGNOSIS — J449 Chronic obstructive pulmonary disease, unspecified: Secondary | ICD-10-CM | POA: Diagnosis not present

## 2017-03-18 DIAGNOSIS — I5023 Acute on chronic systolic (congestive) heart failure: Secondary | ICD-10-CM | POA: Diagnosis not present

## 2017-03-18 DIAGNOSIS — R269 Unspecified abnormalities of gait and mobility: Secondary | ICD-10-CM | POA: Diagnosis not present

## 2017-03-18 DIAGNOSIS — Z950 Presence of cardiac pacemaker: Secondary | ICD-10-CM | POA: Diagnosis not present

## 2017-03-21 DIAGNOSIS — S51009A Unspecified open wound of unspecified elbow, initial encounter: Secondary | ICD-10-CM | POA: Diagnosis not present

## 2017-03-21 DIAGNOSIS — S81801A Unspecified open wound, right lower leg, initial encounter: Secondary | ICD-10-CM | POA: Diagnosis not present

## 2017-03-21 DIAGNOSIS — I5023 Acute on chronic systolic (congestive) heart failure: Secondary | ICD-10-CM | POA: Diagnosis not present

## 2017-03-21 DIAGNOSIS — S61402A Unspecified open wound of left hand, initial encounter: Secondary | ICD-10-CM | POA: Diagnosis not present

## 2017-03-21 DIAGNOSIS — R269 Unspecified abnormalities of gait and mobility: Secondary | ICD-10-CM | POA: Diagnosis not present

## 2017-03-21 DIAGNOSIS — J449 Chronic obstructive pulmonary disease, unspecified: Secondary | ICD-10-CM | POA: Diagnosis not present

## 2017-03-21 DIAGNOSIS — S42202D Unspecified fracture of upper end of left humerus, subsequent encounter for fracture with routine healing: Secondary | ICD-10-CM | POA: Diagnosis not present

## 2017-03-21 DIAGNOSIS — Z950 Presence of cardiac pacemaker: Secondary | ICD-10-CM | POA: Diagnosis not present

## 2017-03-22 ENCOUNTER — Ambulatory Visit (INDEPENDENT_AMBULATORY_CARE_PROVIDER_SITE_OTHER): Payer: Self-pay | Admitting: Orthopedic Surgery

## 2017-03-22 ENCOUNTER — Ambulatory Visit (INDEPENDENT_AMBULATORY_CARE_PROVIDER_SITE_OTHER): Payer: Medicare Other

## 2017-03-22 ENCOUNTER — Encounter: Payer: Self-pay | Admitting: Orthopedic Surgery

## 2017-03-22 VITALS — BP 111/60 | HR 62 | Temp 98.1°F

## 2017-03-22 DIAGNOSIS — S42202D Unspecified fracture of upper end of left humerus, subsequent encounter for fracture with routine healing: Secondary | ICD-10-CM

## 2017-03-22 DIAGNOSIS — J449 Chronic obstructive pulmonary disease, unspecified: Secondary | ICD-10-CM | POA: Diagnosis not present

## 2017-03-22 DIAGNOSIS — R269 Unspecified abnormalities of gait and mobility: Secondary | ICD-10-CM | POA: Diagnosis not present

## 2017-03-22 DIAGNOSIS — I5023 Acute on chronic systolic (congestive) heart failure: Secondary | ICD-10-CM | POA: Diagnosis not present

## 2017-03-22 DIAGNOSIS — Z950 Presence of cardiac pacemaker: Secondary | ICD-10-CM | POA: Diagnosis not present

## 2017-03-22 NOTE — Progress Notes (Signed)
Fracture care follow-up  Chief Complaint  Patient presents with  . Follow-up    Lt proximal humerus fx, DOI 03/05/17   83 year old male with multiple medical problems with a proximal left humerus fracture. I consulted with Dr. August Saucerean and both of us agreed that the patient should be treated nonoperatively.  We did get a CAT scan which did confirm there is no dislocation despite angulation of the fracture  Date of injury was 03/05/2017  This is post injury day number 17  Today's x-ray shows: Medial displacement of the fracture fragment approximately 30-40% shaft width.  There is some distraction of the fracture fragment on the AP view the humeral head is located  There is severe comminution around the head.  The lateral x-ray shows reasonable axial alignment  Clinical exam  He remains neurovascular intact   Closed fracture of proximal end of left humerus with routine healing, unspecified fracture morphology, subsequent encounter  (primary encounter diagnosis) Plan start physical therapy repeat x-ray 3 weeks  Increase pain medication to every 6 hours.  [facility]         Current Outpatient Prescriptions:  .  acetaminophen (TYLENOL) 500 MG tablet, Take 1,000 mg by mouth every 6 (six) hours as needed for mild pain, moderate pain or headache. , Disp: , Rfl:  .  aspirin EC 81 MG tablet, Take 81 mg by mouth daily. , Disp: , Rfl:  .  atenolol (TENORMIN) 50 MG tablet, Take 25 mg by mouth daily. , Disp: , Rfl:  .  furosemide (LASIX) 20 MG tablet, Take 20 mg by mouth daily. , Disp: , Rfl:  .  insulin glargine (LANTUS) 100 UNIT/ML injection, Inject 0.3 mLs (30 Units total) into the skin at bedtime., Disp: 10 mL, Rfl: 11 .  insulin lispro (HUMALOG JUNIOR KWIKPEN) 100 UNIT/ML KiwkPen, Inject 12-13 Units into the skin daily. , Disp: , Rfl:  .  Multiple Vitamin (MULTIVITAMIN WITH MINERALS) TABS tablet, Take 1 tablet by mouth daily., Disp: , Rfl:  .   Multiple Vitamins-Minerals (ICAPS AREDS 2) CAPS, Take 1 capsule by mouth daily., Disp: , Rfl:  .  oxyCODONE (OXY IR/ROXICODONE) 5 MG immediate release tablet, Take 0.5-1 tablets (2.5-5 mg total) by mouth every 4 (four) hours as needed for moderate pain., Disp: 30 tablet, Rfl: 0 .  polyethylene glycol (MIRALAX / GLYCOLAX) packet, Take 17 g by mouth daily as needed for mild constipation., Disp: 14 each, Rfl: 0    Encounter Diagnosis  Name Primary?  . Closed fracture of proximal end of left humerus with routine healing, unspecified fracture morphology, subsequent encounter Yes    Plan:   CONTINUE NON OPERATIVE TREATMENT WITH SLING ADD PHYSICAL THERAPY

## 2017-03-22 NOTE — Patient Instructions (Signed)
CONTINUE SHOULDER IMMOBILIZATION   START PHYSICAL THERAPY

## 2017-03-23 DIAGNOSIS — I5023 Acute on chronic systolic (congestive) heart failure: Secondary | ICD-10-CM | POA: Diagnosis not present

## 2017-03-23 DIAGNOSIS — R269 Unspecified abnormalities of gait and mobility: Secondary | ICD-10-CM | POA: Diagnosis not present

## 2017-03-23 DIAGNOSIS — S42202D Unspecified fracture of upper end of left humerus, subsequent encounter for fracture with routine healing: Secondary | ICD-10-CM | POA: Diagnosis not present

## 2017-03-23 DIAGNOSIS — J449 Chronic obstructive pulmonary disease, unspecified: Secondary | ICD-10-CM | POA: Diagnosis not present

## 2017-03-23 DIAGNOSIS — Z950 Presence of cardiac pacemaker: Secondary | ICD-10-CM | POA: Diagnosis not present

## 2017-03-24 DIAGNOSIS — S42202D Unspecified fracture of upper end of left humerus, subsequent encounter for fracture with routine healing: Secondary | ICD-10-CM | POA: Diagnosis not present

## 2017-03-24 DIAGNOSIS — J449 Chronic obstructive pulmonary disease, unspecified: Secondary | ICD-10-CM | POA: Diagnosis not present

## 2017-03-24 DIAGNOSIS — Z950 Presence of cardiac pacemaker: Secondary | ICD-10-CM | POA: Diagnosis not present

## 2017-03-24 DIAGNOSIS — I5023 Acute on chronic systolic (congestive) heart failure: Secondary | ICD-10-CM | POA: Diagnosis not present

## 2017-03-24 DIAGNOSIS — R269 Unspecified abnormalities of gait and mobility: Secondary | ICD-10-CM | POA: Diagnosis not present

## 2017-03-25 DIAGNOSIS — I5023 Acute on chronic systolic (congestive) heart failure: Secondary | ICD-10-CM | POA: Diagnosis not present

## 2017-03-25 DIAGNOSIS — Z950 Presence of cardiac pacemaker: Secondary | ICD-10-CM | POA: Diagnosis not present

## 2017-03-25 DIAGNOSIS — J449 Chronic obstructive pulmonary disease, unspecified: Secondary | ICD-10-CM | POA: Diagnosis not present

## 2017-03-25 DIAGNOSIS — R269 Unspecified abnormalities of gait and mobility: Secondary | ICD-10-CM | POA: Diagnosis not present

## 2017-03-25 DIAGNOSIS — S42202D Unspecified fracture of upper end of left humerus, subsequent encounter for fracture with routine healing: Secondary | ICD-10-CM | POA: Diagnosis not present

## 2017-03-28 DIAGNOSIS — S42202D Unspecified fracture of upper end of left humerus, subsequent encounter for fracture with routine healing: Secondary | ICD-10-CM | POA: Diagnosis not present

## 2017-03-28 DIAGNOSIS — J449 Chronic obstructive pulmonary disease, unspecified: Secondary | ICD-10-CM | POA: Diagnosis not present

## 2017-03-28 DIAGNOSIS — Z950 Presence of cardiac pacemaker: Secondary | ICD-10-CM | POA: Diagnosis not present

## 2017-03-28 DIAGNOSIS — R269 Unspecified abnormalities of gait and mobility: Secondary | ICD-10-CM | POA: Diagnosis not present

## 2017-03-28 DIAGNOSIS — I5023 Acute on chronic systolic (congestive) heart failure: Secondary | ICD-10-CM | POA: Diagnosis not present

## 2017-03-29 DIAGNOSIS — I5023 Acute on chronic systolic (congestive) heart failure: Secondary | ICD-10-CM | POA: Diagnosis not present

## 2017-03-29 DIAGNOSIS — Z950 Presence of cardiac pacemaker: Secondary | ICD-10-CM | POA: Diagnosis not present

## 2017-03-29 DIAGNOSIS — J449 Chronic obstructive pulmonary disease, unspecified: Secondary | ICD-10-CM | POA: Diagnosis not present

## 2017-03-29 DIAGNOSIS — R269 Unspecified abnormalities of gait and mobility: Secondary | ICD-10-CM | POA: Diagnosis not present

## 2017-03-29 DIAGNOSIS — S42202D Unspecified fracture of upper end of left humerus, subsequent encounter for fracture with routine healing: Secondary | ICD-10-CM | POA: Diagnosis not present

## 2017-03-30 DIAGNOSIS — S42202D Unspecified fracture of upper end of left humerus, subsequent encounter for fracture with routine healing: Secondary | ICD-10-CM | POA: Diagnosis not present

## 2017-03-30 DIAGNOSIS — J449 Chronic obstructive pulmonary disease, unspecified: Secondary | ICD-10-CM | POA: Diagnosis not present

## 2017-03-30 DIAGNOSIS — R269 Unspecified abnormalities of gait and mobility: Secondary | ICD-10-CM | POA: Diagnosis not present

## 2017-03-30 DIAGNOSIS — I5023 Acute on chronic systolic (congestive) heart failure: Secondary | ICD-10-CM | POA: Diagnosis not present

## 2017-03-30 DIAGNOSIS — Z950 Presence of cardiac pacemaker: Secondary | ICD-10-CM | POA: Diagnosis not present

## 2017-03-31 DIAGNOSIS — J449 Chronic obstructive pulmonary disease, unspecified: Secondary | ICD-10-CM | POA: Diagnosis not present

## 2017-03-31 DIAGNOSIS — I5023 Acute on chronic systolic (congestive) heart failure: Secondary | ICD-10-CM | POA: Diagnosis not present

## 2017-03-31 DIAGNOSIS — S42202D Unspecified fracture of upper end of left humerus, subsequent encounter for fracture with routine healing: Secondary | ICD-10-CM | POA: Diagnosis not present

## 2017-03-31 DIAGNOSIS — Z950 Presence of cardiac pacemaker: Secondary | ICD-10-CM | POA: Diagnosis not present

## 2017-03-31 DIAGNOSIS — R269 Unspecified abnormalities of gait and mobility: Secondary | ICD-10-CM | POA: Diagnosis not present

## 2017-04-01 DIAGNOSIS — R269 Unspecified abnormalities of gait and mobility: Secondary | ICD-10-CM | POA: Diagnosis not present

## 2017-04-01 DIAGNOSIS — Z950 Presence of cardiac pacemaker: Secondary | ICD-10-CM | POA: Diagnosis not present

## 2017-04-01 DIAGNOSIS — J449 Chronic obstructive pulmonary disease, unspecified: Secondary | ICD-10-CM | POA: Diagnosis not present

## 2017-04-01 DIAGNOSIS — I5023 Acute on chronic systolic (congestive) heart failure: Secondary | ICD-10-CM | POA: Diagnosis not present

## 2017-04-01 DIAGNOSIS — S42202D Unspecified fracture of upper end of left humerus, subsequent encounter for fracture with routine healing: Secondary | ICD-10-CM | POA: Diagnosis not present

## 2017-04-04 DIAGNOSIS — S42202D Unspecified fracture of upper end of left humerus, subsequent encounter for fracture with routine healing: Secondary | ICD-10-CM | POA: Diagnosis not present

## 2017-04-04 DIAGNOSIS — I5023 Acute on chronic systolic (congestive) heart failure: Secondary | ICD-10-CM | POA: Diagnosis not present

## 2017-04-04 DIAGNOSIS — J449 Chronic obstructive pulmonary disease, unspecified: Secondary | ICD-10-CM | POA: Diagnosis not present

## 2017-04-04 DIAGNOSIS — Z950 Presence of cardiac pacemaker: Secondary | ICD-10-CM | POA: Diagnosis not present

## 2017-04-04 DIAGNOSIS — R269 Unspecified abnormalities of gait and mobility: Secondary | ICD-10-CM | POA: Diagnosis not present

## 2017-04-05 DIAGNOSIS — Z950 Presence of cardiac pacemaker: Secondary | ICD-10-CM | POA: Diagnosis not present

## 2017-04-05 DIAGNOSIS — I5023 Acute on chronic systolic (congestive) heart failure: Secondary | ICD-10-CM | POA: Diagnosis not present

## 2017-04-05 DIAGNOSIS — S42202D Unspecified fracture of upper end of left humerus, subsequent encounter for fracture with routine healing: Secondary | ICD-10-CM | POA: Diagnosis not present

## 2017-04-05 DIAGNOSIS — J449 Chronic obstructive pulmonary disease, unspecified: Secondary | ICD-10-CM | POA: Diagnosis not present

## 2017-04-05 DIAGNOSIS — R269 Unspecified abnormalities of gait and mobility: Secondary | ICD-10-CM | POA: Diagnosis not present

## 2017-04-06 DIAGNOSIS — I5023 Acute on chronic systolic (congestive) heart failure: Secondary | ICD-10-CM | POA: Diagnosis not present

## 2017-04-06 DIAGNOSIS — J449 Chronic obstructive pulmonary disease, unspecified: Secondary | ICD-10-CM | POA: Diagnosis not present

## 2017-04-06 DIAGNOSIS — E118 Type 2 diabetes mellitus with unspecified complications: Secondary | ICD-10-CM | POA: Diagnosis not present

## 2017-04-06 DIAGNOSIS — S42202D Unspecified fracture of upper end of left humerus, subsequent encounter for fracture with routine healing: Secondary | ICD-10-CM | POA: Diagnosis not present

## 2017-04-06 DIAGNOSIS — Z950 Presence of cardiac pacemaker: Secondary | ICD-10-CM | POA: Diagnosis not present

## 2017-04-06 DIAGNOSIS — R269 Unspecified abnormalities of gait and mobility: Secondary | ICD-10-CM | POA: Diagnosis not present

## 2017-04-07 DIAGNOSIS — I5023 Acute on chronic systolic (congestive) heart failure: Secondary | ICD-10-CM | POA: Diagnosis not present

## 2017-04-07 DIAGNOSIS — Z950 Presence of cardiac pacemaker: Secondary | ICD-10-CM | POA: Diagnosis not present

## 2017-04-07 DIAGNOSIS — S42202D Unspecified fracture of upper end of left humerus, subsequent encounter for fracture with routine healing: Secondary | ICD-10-CM | POA: Diagnosis not present

## 2017-04-07 DIAGNOSIS — R269 Unspecified abnormalities of gait and mobility: Secondary | ICD-10-CM | POA: Diagnosis not present

## 2017-04-07 DIAGNOSIS — J449 Chronic obstructive pulmonary disease, unspecified: Secondary | ICD-10-CM | POA: Diagnosis not present

## 2017-04-08 DIAGNOSIS — Z950 Presence of cardiac pacemaker: Secondary | ICD-10-CM | POA: Diagnosis not present

## 2017-04-08 DIAGNOSIS — R269 Unspecified abnormalities of gait and mobility: Secondary | ICD-10-CM | POA: Diagnosis not present

## 2017-04-08 DIAGNOSIS — I5023 Acute on chronic systolic (congestive) heart failure: Secondary | ICD-10-CM | POA: Diagnosis not present

## 2017-04-08 DIAGNOSIS — J449 Chronic obstructive pulmonary disease, unspecified: Secondary | ICD-10-CM | POA: Diagnosis not present

## 2017-04-08 DIAGNOSIS — S42202D Unspecified fracture of upper end of left humerus, subsequent encounter for fracture with routine healing: Secondary | ICD-10-CM | POA: Diagnosis not present

## 2017-04-11 DIAGNOSIS — R269 Unspecified abnormalities of gait and mobility: Secondary | ICD-10-CM | POA: Diagnosis not present

## 2017-04-11 DIAGNOSIS — I5023 Acute on chronic systolic (congestive) heart failure: Secondary | ICD-10-CM | POA: Diagnosis not present

## 2017-04-11 DIAGNOSIS — J449 Chronic obstructive pulmonary disease, unspecified: Secondary | ICD-10-CM | POA: Diagnosis not present

## 2017-04-11 DIAGNOSIS — S42202D Unspecified fracture of upper end of left humerus, subsequent encounter for fracture with routine healing: Secondary | ICD-10-CM | POA: Diagnosis not present

## 2017-04-11 DIAGNOSIS — Z950 Presence of cardiac pacemaker: Secondary | ICD-10-CM | POA: Diagnosis not present

## 2017-04-12 ENCOUNTER — Ambulatory Visit (INDEPENDENT_AMBULATORY_CARE_PROVIDER_SITE_OTHER): Payer: Self-pay | Admitting: Orthopedic Surgery

## 2017-04-12 ENCOUNTER — Ambulatory Visit (INDEPENDENT_AMBULATORY_CARE_PROVIDER_SITE_OTHER): Payer: Medicare Other

## 2017-04-12 ENCOUNTER — Ambulatory Visit: Payer: Medicare Other

## 2017-04-12 ENCOUNTER — Encounter: Payer: Self-pay | Admitting: Orthopedic Surgery

## 2017-04-12 DIAGNOSIS — S42202D Unspecified fracture of upper end of left humerus, subsequent encounter for fracture with routine healing: Secondary | ICD-10-CM

## 2017-04-12 DIAGNOSIS — Z950 Presence of cardiac pacemaker: Secondary | ICD-10-CM | POA: Diagnosis not present

## 2017-04-12 DIAGNOSIS — R269 Unspecified abnormalities of gait and mobility: Secondary | ICD-10-CM | POA: Diagnosis not present

## 2017-04-12 DIAGNOSIS — I5023 Acute on chronic systolic (congestive) heart failure: Secondary | ICD-10-CM | POA: Diagnosis not present

## 2017-04-12 DIAGNOSIS — J449 Chronic obstructive pulmonary disease, unspecified: Secondary | ICD-10-CM | POA: Diagnosis not present

## 2017-04-12 NOTE — Progress Notes (Signed)
Follow up   Chief Complaint  Patient presents with  . Follow-up    Recheck on left shoulder fracture, DOI 03-05-17.    X-ray shows fibrous union  I was able to abduct and flex his arm with no bone motion indicating one single unit but avoiding the x-ray is really a fibrous union situation  No weightbearing for the next 2 weeks  Continue therapy out to 6 weeks  Return 1 month  Encounter Diagnosis  Name Primary?  . Closed fracture of proximal end of left humerus with routine healing, unspecified fracture morphology, subsequent encounter Yes

## 2017-04-13 DIAGNOSIS — R269 Unspecified abnormalities of gait and mobility: Secondary | ICD-10-CM | POA: Diagnosis not present

## 2017-04-13 DIAGNOSIS — S42202D Unspecified fracture of upper end of left humerus, subsequent encounter for fracture with routine healing: Secondary | ICD-10-CM | POA: Diagnosis not present

## 2017-04-13 DIAGNOSIS — J449 Chronic obstructive pulmonary disease, unspecified: Secondary | ICD-10-CM | POA: Diagnosis not present

## 2017-04-13 DIAGNOSIS — I5023 Acute on chronic systolic (congestive) heart failure: Secondary | ICD-10-CM | POA: Diagnosis not present

## 2017-04-13 DIAGNOSIS — Z950 Presence of cardiac pacemaker: Secondary | ICD-10-CM | POA: Diagnosis not present

## 2017-04-14 DIAGNOSIS — S42202D Unspecified fracture of upper end of left humerus, subsequent encounter for fracture with routine healing: Secondary | ICD-10-CM | POA: Diagnosis not present

## 2017-04-14 DIAGNOSIS — Z950 Presence of cardiac pacemaker: Secondary | ICD-10-CM | POA: Diagnosis not present

## 2017-04-14 DIAGNOSIS — J449 Chronic obstructive pulmonary disease, unspecified: Secondary | ICD-10-CM | POA: Diagnosis not present

## 2017-04-14 DIAGNOSIS — I5023 Acute on chronic systolic (congestive) heart failure: Secondary | ICD-10-CM | POA: Diagnosis not present

## 2017-04-14 DIAGNOSIS — R269 Unspecified abnormalities of gait and mobility: Secondary | ICD-10-CM | POA: Diagnosis not present

## 2017-04-15 DIAGNOSIS — Z950 Presence of cardiac pacemaker: Secondary | ICD-10-CM | POA: Diagnosis not present

## 2017-04-15 DIAGNOSIS — S42202D Unspecified fracture of upper end of left humerus, subsequent encounter for fracture with routine healing: Secondary | ICD-10-CM | POA: Diagnosis not present

## 2017-04-15 DIAGNOSIS — I5023 Acute on chronic systolic (congestive) heart failure: Secondary | ICD-10-CM | POA: Diagnosis not present

## 2017-04-15 DIAGNOSIS — J449 Chronic obstructive pulmonary disease, unspecified: Secondary | ICD-10-CM | POA: Diagnosis not present

## 2017-04-15 DIAGNOSIS — R269 Unspecified abnormalities of gait and mobility: Secondary | ICD-10-CM | POA: Diagnosis not present

## 2017-04-18 DIAGNOSIS — I5023 Acute on chronic systolic (congestive) heart failure: Secondary | ICD-10-CM | POA: Diagnosis not present

## 2017-04-18 DIAGNOSIS — K625 Hemorrhage of anus and rectum: Secondary | ICD-10-CM | POA: Diagnosis not present

## 2017-04-18 DIAGNOSIS — J449 Chronic obstructive pulmonary disease, unspecified: Secondary | ICD-10-CM | POA: Diagnosis not present

## 2017-04-18 DIAGNOSIS — M6281 Muscle weakness (generalized): Secondary | ICD-10-CM | POA: Diagnosis not present

## 2017-04-18 DIAGNOSIS — R269 Unspecified abnormalities of gait and mobility: Secondary | ICD-10-CM | POA: Diagnosis not present

## 2017-04-18 DIAGNOSIS — S42202D Unspecified fracture of upper end of left humerus, subsequent encounter for fracture with routine healing: Secondary | ICD-10-CM | POA: Diagnosis not present

## 2017-04-18 DIAGNOSIS — Z950 Presence of cardiac pacemaker: Secondary | ICD-10-CM | POA: Diagnosis not present

## 2017-04-19 ENCOUNTER — Telehealth: Payer: Self-pay | Admitting: Cardiology

## 2017-04-19 ENCOUNTER — Ambulatory Visit (INDEPENDENT_AMBULATORY_CARE_PROVIDER_SITE_OTHER): Payer: Medicare Other | Admitting: *Deleted

## 2017-04-19 DIAGNOSIS — R269 Unspecified abnormalities of gait and mobility: Secondary | ICD-10-CM | POA: Diagnosis not present

## 2017-04-19 DIAGNOSIS — Z950 Presence of cardiac pacemaker: Secondary | ICD-10-CM | POA: Diagnosis not present

## 2017-04-19 DIAGNOSIS — K625 Hemorrhage of anus and rectum: Secondary | ICD-10-CM | POA: Diagnosis not present

## 2017-04-19 DIAGNOSIS — I495 Sick sinus syndrome: Secondary | ICD-10-CM | POA: Diagnosis not present

## 2017-04-19 DIAGNOSIS — M6281 Muscle weakness (generalized): Secondary | ICD-10-CM | POA: Diagnosis not present

## 2017-04-19 DIAGNOSIS — E118 Type 2 diabetes mellitus with unspecified complications: Secondary | ICD-10-CM | POA: Diagnosis not present

## 2017-04-19 DIAGNOSIS — S42202D Unspecified fracture of upper end of left humerus, subsequent encounter for fracture with routine healing: Secondary | ICD-10-CM | POA: Diagnosis not present

## 2017-04-19 DIAGNOSIS — J449 Chronic obstructive pulmonary disease, unspecified: Secondary | ICD-10-CM | POA: Diagnosis not present

## 2017-04-19 DIAGNOSIS — I5023 Acute on chronic systolic (congestive) heart failure: Secondary | ICD-10-CM | POA: Diagnosis not present

## 2017-04-19 NOTE — Telephone Encounter (Signed)
Confirmed remote transmission w/ pt son. Pt will send when he gets out of the nursing home.

## 2017-04-20 DIAGNOSIS — M79602 Pain in left arm: Secondary | ICD-10-CM | POA: Diagnosis not present

## 2017-04-20 DIAGNOSIS — T148XXA Other injury of unspecified body region, initial encounter: Secondary | ICD-10-CM | POA: Diagnosis not present

## 2017-04-21 DIAGNOSIS — J449 Chronic obstructive pulmonary disease, unspecified: Secondary | ICD-10-CM | POA: Diagnosis not present

## 2017-04-21 DIAGNOSIS — E119 Type 2 diabetes mellitus without complications: Secondary | ICD-10-CM | POA: Diagnosis not present

## 2017-04-21 DIAGNOSIS — I4891 Unspecified atrial fibrillation: Secondary | ICD-10-CM | POA: Diagnosis not present

## 2017-04-21 DIAGNOSIS — I5032 Chronic diastolic (congestive) heart failure: Secondary | ICD-10-CM | POA: Diagnosis not present

## 2017-04-21 DIAGNOSIS — I10 Essential (primary) hypertension: Secondary | ICD-10-CM | POA: Diagnosis not present

## 2017-04-21 DIAGNOSIS — E1141 Type 2 diabetes mellitus with diabetic mononeuropathy: Secondary | ICD-10-CM | POA: Diagnosis not present

## 2017-04-21 DIAGNOSIS — I872 Venous insufficiency (chronic) (peripheral): Secondary | ICD-10-CM | POA: Diagnosis not present

## 2017-04-21 DIAGNOSIS — R69 Illness, unspecified: Secondary | ICD-10-CM | POA: Diagnosis not present

## 2017-04-21 DIAGNOSIS — M48061 Spinal stenosis, lumbar region without neurogenic claudication: Secondary | ICD-10-CM | POA: Diagnosis not present

## 2017-04-21 DIAGNOSIS — Z9181 History of falling: Secondary | ICD-10-CM | POA: Diagnosis not present

## 2017-04-21 DIAGNOSIS — G4733 Obstructive sleep apnea (adult) (pediatric): Secondary | ICD-10-CM | POA: Diagnosis not present

## 2017-04-21 DIAGNOSIS — Z950 Presence of cardiac pacemaker: Secondary | ICD-10-CM | POA: Diagnosis not present

## 2017-04-21 DIAGNOSIS — E782 Mixed hyperlipidemia: Secondary | ICD-10-CM | POA: Diagnosis not present

## 2017-04-21 DIAGNOSIS — S42202D Unspecified fracture of upper end of left humerus, subsequent encounter for fracture with routine healing: Secondary | ICD-10-CM | POA: Diagnosis not present

## 2017-04-21 DIAGNOSIS — Z7982 Long term (current) use of aspirin: Secondary | ICD-10-CM | POA: Diagnosis not present

## 2017-04-21 DIAGNOSIS — D696 Thrombocytopenia, unspecified: Secondary | ICD-10-CM | POA: Diagnosis not present

## 2017-04-21 DIAGNOSIS — I110 Hypertensive heart disease with heart failure: Secondary | ICD-10-CM | POA: Diagnosis not present

## 2017-04-21 DIAGNOSIS — Z742 Need for assistance at home and no other household member able to render care: Secondary | ICD-10-CM | POA: Diagnosis not present

## 2017-04-21 DIAGNOSIS — G589 Mononeuropathy, unspecified: Secondary | ICD-10-CM | POA: Diagnosis not present

## 2017-04-21 DIAGNOSIS — Z794 Long term (current) use of insulin: Secondary | ICD-10-CM | POA: Diagnosis not present

## 2017-04-21 DIAGNOSIS — I5023 Acute on chronic systolic (congestive) heart failure: Secondary | ICD-10-CM | POA: Diagnosis not present

## 2017-04-21 NOTE — Progress Notes (Signed)
Remote pacemaker transmission.   

## 2017-04-22 ENCOUNTER — Encounter: Payer: Self-pay | Admitting: Cardiology

## 2017-04-22 LAB — CUP PACEART REMOTE DEVICE CHECK
Battery Voltage: 2.75 V
Brady Statistic AS VS Percent: 0 %
Date Time Interrogation Session: 20180704173433
Implantable Lead Implant Date: 19960418
Implantable Lead Implant Date: 19960418
Lead Channel Impedance Value: 1088 Ohm
Lead Channel Pacing Threshold Amplitude: 1.125 V
Lead Channel Pacing Threshold Pulse Width: 0.4 ms
Lead Channel Setting Pacing Amplitude: 2 V
Lead Channel Setting Sensing Sensitivity: 2.8 mV
MDC IDC LEAD LOCATION: 753859
MDC IDC LEAD LOCATION: 753860
MDC IDC MSMT BATTERY IMPEDANCE: 2531 Ohm
MDC IDC MSMT BATTERY REMAINING LONGEVITY: 24 mo
MDC IDC MSMT LEADCHNL RA IMPEDANCE VALUE: 876 Ohm
MDC IDC MSMT LEADCHNL RA PACING THRESHOLD AMPLITUDE: 0.5 V
MDC IDC MSMT LEADCHNL RA PACING THRESHOLD PULSEWIDTH: 0.4 ms
MDC IDC PG IMPLANT DT: 20081211
MDC IDC SET LEADCHNL RV PACING AMPLITUDE: 2.5 V
MDC IDC SET LEADCHNL RV PACING PULSEWIDTH: 0.4 ms
MDC IDC STAT BRADY AP VP PERCENT: 89 %
MDC IDC STAT BRADY AP VS PERCENT: 0 %
MDC IDC STAT BRADY AS VP PERCENT: 11 %

## 2017-04-23 ENCOUNTER — Encounter (HOSPITAL_COMMUNITY): Payer: Self-pay

## 2017-04-23 ENCOUNTER — Inpatient Hospital Stay (HOSPITAL_COMMUNITY): Payer: Medicare Other

## 2017-04-23 ENCOUNTER — Inpatient Hospital Stay (HOSPITAL_COMMUNITY)
Admission: EM | Admit: 2017-04-23 | Discharge: 2017-04-26 | DRG: 377 | Disposition: A | Discharge status: Skilled Nursing Facility | Payer: Medicare Other | Attending: Family Medicine | Admitting: Family Medicine

## 2017-04-23 DIAGNOSIS — K5521 Angiodysplasia of colon with hemorrhage: Principal | ICD-10-CM | POA: Diagnosis present

## 2017-04-23 DIAGNOSIS — Z87891 Personal history of nicotine dependence: Secondary | ICD-10-CM

## 2017-04-23 DIAGNOSIS — M48061 Spinal stenosis, lumbar region without neurogenic claudication: Secondary | ICD-10-CM | POA: Diagnosis not present

## 2017-04-23 DIAGNOSIS — D62 Acute posthemorrhagic anemia: Secondary | ICD-10-CM

## 2017-04-23 DIAGNOSIS — G4733 Obstructive sleep apnea (adult) (pediatric): Secondary | ICD-10-CM | POA: Diagnosis present

## 2017-04-23 DIAGNOSIS — I5032 Chronic diastolic (congestive) heart failure: Secondary | ICD-10-CM | POA: Diagnosis present

## 2017-04-23 DIAGNOSIS — R578 Other shock: Secondary | ICD-10-CM | POA: Diagnosis present

## 2017-04-23 DIAGNOSIS — J42 Unspecified chronic bronchitis: Secondary | ICD-10-CM | POA: Diagnosis not present

## 2017-04-23 DIAGNOSIS — Z7401 Bed confinement status: Secondary | ICD-10-CM | POA: Diagnosis not present

## 2017-04-23 DIAGNOSIS — Z888 Allergy status to other drugs, medicaments and biological substances status: Secondary | ICD-10-CM

## 2017-04-23 DIAGNOSIS — K269 Duodenal ulcer, unspecified as acute or chronic, without hemorrhage or perforation: Secondary | ICD-10-CM | POA: Diagnosis present

## 2017-04-23 DIAGNOSIS — Z9841 Cataract extraction status, right eye: Secondary | ICD-10-CM | POA: Diagnosis not present

## 2017-04-23 DIAGNOSIS — I959 Hypotension, unspecified: Secondary | ICD-10-CM

## 2017-04-23 DIAGNOSIS — Z7982 Long term (current) use of aspirin: Secondary | ICD-10-CM | POA: Diagnosis not present

## 2017-04-23 DIAGNOSIS — R55 Syncope and collapse: Secondary | ICD-10-CM

## 2017-04-23 DIAGNOSIS — Z794 Long term (current) use of insulin: Secondary | ICD-10-CM | POA: Diagnosis not present

## 2017-04-23 DIAGNOSIS — J449 Chronic obstructive pulmonary disease, unspecified: Secondary | ICD-10-CM | POA: Diagnosis present

## 2017-04-23 DIAGNOSIS — Z880 Allergy status to penicillin: Secondary | ICD-10-CM

## 2017-04-23 DIAGNOSIS — K210 Gastro-esophageal reflux disease with esophagitis: Secondary | ICD-10-CM | POA: Diagnosis present

## 2017-04-23 DIAGNOSIS — K625 Hemorrhage of anus and rectum: Secondary | ICD-10-CM | POA: Diagnosis not present

## 2017-04-23 DIAGNOSIS — K274 Chronic or unspecified peptic ulcer, site unspecified, with hemorrhage: Secondary | ICD-10-CM

## 2017-04-23 DIAGNOSIS — E118 Type 2 diabetes mellitus with unspecified complications: Secondary | ICD-10-CM | POA: Diagnosis present

## 2017-04-23 DIAGNOSIS — E875 Hyperkalemia: Secondary | ICD-10-CM | POA: Diagnosis present

## 2017-04-23 DIAGNOSIS — Z833 Family history of diabetes mellitus: Secondary | ICD-10-CM

## 2017-04-23 DIAGNOSIS — I272 Pulmonary hypertension, unspecified: Secondary | ICD-10-CM | POA: Diagnosis present

## 2017-04-23 DIAGNOSIS — I482 Chronic atrial fibrillation: Secondary | ICD-10-CM | POA: Diagnosis not present

## 2017-04-23 DIAGNOSIS — D500 Iron deficiency anemia secondary to blood loss (chronic): Secondary | ICD-10-CM | POA: Diagnosis not present

## 2017-04-23 DIAGNOSIS — S42202D Unspecified fracture of upper end of left humerus, subsequent encounter for fracture with routine healing: Secondary | ICD-10-CM | POA: Diagnosis not present

## 2017-04-23 DIAGNOSIS — K2211 Ulcer of esophagus with bleeding: Secondary | ICD-10-CM | POA: Diagnosis not present

## 2017-04-23 DIAGNOSIS — K922 Gastrointestinal hemorrhage, unspecified: Secondary | ICD-10-CM

## 2017-04-23 DIAGNOSIS — K6381 Dieulafoy lesion of intestine: Secondary | ICD-10-CM | POA: Diagnosis present

## 2017-04-23 DIAGNOSIS — Z9842 Cataract extraction status, left eye: Secondary | ICD-10-CM

## 2017-04-23 DIAGNOSIS — K5791 Diverticulosis of intestine, part unspecified, without perforation or abscess with bleeding: Secondary | ICD-10-CM | POA: Diagnosis not present

## 2017-04-23 DIAGNOSIS — N179 Acute kidney failure, unspecified: Secondary | ICD-10-CM | POA: Diagnosis not present

## 2017-04-23 DIAGNOSIS — Z950 Presence of cardiac pacemaker: Secondary | ICD-10-CM | POA: Diagnosis not present

## 2017-04-23 DIAGNOSIS — I5022 Chronic systolic (congestive) heart failure: Secondary | ICD-10-CM | POA: Diagnosis not present

## 2017-04-23 DIAGNOSIS — I110 Hypertensive heart disease with heart failure: Secondary | ICD-10-CM | POA: Diagnosis present

## 2017-04-23 DIAGNOSIS — K921 Melena: Secondary | ICD-10-CM | POA: Diagnosis not present

## 2017-04-23 DIAGNOSIS — R061 Stridor: Secondary | ICD-10-CM | POA: Diagnosis not present

## 2017-04-23 DIAGNOSIS — R269 Unspecified abnormalities of gait and mobility: Secondary | ICD-10-CM | POA: Diagnosis not present

## 2017-04-23 DIAGNOSIS — E119 Type 2 diabetes mellitus without complications: Secondary | ICD-10-CM | POA: Diagnosis present

## 2017-04-23 DIAGNOSIS — I5033 Acute on chronic diastolic (congestive) heart failure: Secondary | ICD-10-CM | POA: Diagnosis present

## 2017-04-23 DIAGNOSIS — R404 Transient alteration of awareness: Secondary | ICD-10-CM | POA: Diagnosis not present

## 2017-04-23 DIAGNOSIS — I10 Essential (primary) hypertension: Secondary | ICD-10-CM | POA: Diagnosis not present

## 2017-04-23 DIAGNOSIS — R531 Weakness: Secondary | ICD-10-CM | POA: Diagnosis not present

## 2017-04-23 DIAGNOSIS — R1312 Dysphagia, oropharyngeal phase: Secondary | ICD-10-CM | POA: Diagnosis not present

## 2017-04-23 DIAGNOSIS — R499 Unspecified voice and resonance disorder: Secondary | ICD-10-CM | POA: Diagnosis not present

## 2017-04-23 DIAGNOSIS — R279 Unspecified lack of coordination: Secondary | ICD-10-CM | POA: Diagnosis not present

## 2017-04-23 DIAGNOSIS — M6281 Muscle weakness (generalized): Secondary | ICD-10-CM | POA: Diagnosis not present

## 2017-04-23 LAB — BASIC METABOLIC PANEL
ANION GAP: 7 (ref 5–15)
BUN: 47 mg/dL — AB (ref 6–20)
CALCIUM: 8.1 mg/dL — AB (ref 8.9–10.3)
CO2: 25 mmol/L (ref 22–32)
CREATININE: 1.23 mg/dL (ref 0.61–1.24)
Chloride: 107 mmol/L (ref 101–111)
GFR calc Af Amer: 57 mL/min — ABNORMAL LOW (ref >60)
GFR, EST NON AFRICAN AMERICAN: 49 mL/min — AB (ref >60)
GLUCOSE: 202 mg/dL — AB (ref 65–99)
Potassium: 6.5 mmol/L (ref 3.5–5.1)
Sodium: 139 mmol/L (ref 135–145)

## 2017-04-23 LAB — CBC WITH DIFFERENTIAL/PLATELET
Basophils Absolute: 0 10*3/uL (ref 0.0–0.1)
Basophils Relative: 0 %
EOS PCT: 0 %
Eosinophils Absolute: 0.1 10*3/uL (ref 0.0–0.7)
HCT: 32 % — ABNORMAL LOW (ref 39.0–52.0)
HEMOGLOBIN: 10.3 g/dL — AB (ref 13.0–17.0)
LYMPHS ABS: 1.2 10*3/uL (ref 0.7–4.0)
LYMPHS PCT: 11 %
MCH: 32.1 pg (ref 26.0–34.0)
MCHC: 32.2 g/dL (ref 30.0–36.0)
MCV: 99.7 fL (ref 78.0–100.0)
Monocytes Absolute: 1 10*3/uL (ref 0.1–1.0)
Monocytes Relative: 9 %
NEUTROS ABS: 9 10*3/uL — AB (ref 1.7–7.7)
NEUTROS PCT: 80 %
PLATELETS: 181 10*3/uL (ref 150–400)
RBC: 3.21 MIL/uL — AB (ref 4.22–5.81)
RDW: 15.3 % (ref 11.5–15.5)
WBC: 11.3 10*3/uL — AB (ref 4.0–10.5)

## 2017-04-23 LAB — COMPREHENSIVE METABOLIC PANEL
ALT: 26 U/L (ref 17–63)
ANION GAP: 12 (ref 5–15)
AST: 34 U/L (ref 15–41)
Albumin: 2.7 g/dL — ABNORMAL LOW (ref 3.5–5.0)
Alkaline Phosphatase: 78 U/L (ref 38–126)
BILIRUBIN TOTAL: 1.1 mg/dL (ref 0.3–1.2)
BUN: 41 mg/dL — ABNORMAL HIGH (ref 6–20)
CHLORIDE: 101 mmol/L (ref 101–111)
CO2: 22 mmol/L (ref 22–32)
Calcium: 8.1 mg/dL — ABNORMAL LOW (ref 8.9–10.3)
Creatinine, Ser: 1.18 mg/dL (ref 0.61–1.24)
GFR, EST AFRICAN AMERICAN: 59 mL/min — AB (ref >60)
GFR, EST NON AFRICAN AMERICAN: 51 mL/min — AB (ref >60)
Glucose, Bld: 222 mg/dL — ABNORMAL HIGH (ref 65–99)
POTASSIUM: 5.8 mmol/L — AB (ref 3.5–5.1)
Sodium: 135 mmol/L (ref 135–145)
TOTAL PROTEIN: 5.5 g/dL — AB (ref 6.5–8.1)

## 2017-04-23 LAB — GLUCOSE, CAPILLARY
GLUCOSE-CAPILLARY: 242 mg/dL — AB (ref 65–99)
Glucose-Capillary: 196 mg/dL — ABNORMAL HIGH (ref 65–99)
Glucose-Capillary: 230 mg/dL — ABNORMAL HIGH (ref 65–99)
Glucose-Capillary: 231 mg/dL — ABNORMAL HIGH (ref 65–99)
Glucose-Capillary: 257 mg/dL — ABNORMAL HIGH (ref 65–99)

## 2017-04-23 LAB — CBC
HCT: 26.1 % — ABNORMAL LOW (ref 39.0–52.0)
HCT: 30.2 % — ABNORMAL LOW (ref 39.0–52.0)
HEMATOCRIT: 29 % — AB (ref 39.0–52.0)
HEMOGLOBIN: 8.5 g/dL — AB (ref 13.0–17.0)
HEMOGLOBIN: 10.1 g/dL — AB (ref 13.0–17.0)
Hemoglobin: 9.6 g/dL — ABNORMAL LOW (ref 13.0–17.0)
MCH: 32.4 pg (ref 26.0–34.0)
MCH: 32 pg (ref 26.0–34.0)
MCH: 31.5 pg (ref 26.0–34.0)
MCHC: 32.6 g/dL (ref 30.0–36.0)
MCHC: 33.4 g/dL (ref 30.0–36.0)
MCHC: 33.1 g/dL (ref 30.0–36.0)
MCV: 99.6 fL (ref 78.0–100.0)
MCV: 95.6 fL (ref 78.0–100.0)
MCV: 95.1 fL (ref 78.0–100.0)
Platelets: 178 10*3/uL (ref 150–400)
Platelets: 161 10*3/uL (ref 150–400)
Platelets: 157 10*3/uL (ref 150–400)
RBC: 2.62 MIL/uL — ABNORMAL LOW (ref 4.22–5.81)
RBC: 3.16 MIL/uL — AB (ref 4.22–5.81)
RBC: 3.05 MIL/uL — ABNORMAL LOW (ref 4.22–5.81)
RDW: 15.5 % (ref 11.5–15.5)
RDW: 16.1 % — ABNORMAL HIGH (ref 11.5–15.5)
RDW: 16.6 % — AB (ref 11.5–15.5)
WBC: 13.5 10*3/uL — ABNORMAL HIGH (ref 4.0–10.5)
WBC: 19.7 10*3/uL — ABNORMAL HIGH (ref 4.0–10.5)
WBC: 19.7 10*3/uL — ABNORMAL HIGH (ref 4.0–10.5)

## 2017-04-23 LAB — POTASSIUM
POTASSIUM: 6.5 mmol/L — AB (ref 3.5–5.1)
POTASSIUM: 6 mmol/L — AB (ref 3.5–5.1)

## 2017-04-23 LAB — PREPARE RBC (CROSSMATCH)

## 2017-04-23 LAB — PROTIME-INR
INR: 1.32
Prothrombin Time: 16.5 seconds — ABNORMAL HIGH (ref 11.4–15.2)

## 2017-04-23 LAB — MRSA PCR SCREENING: MRSA by PCR: POSITIVE — AB

## 2017-04-23 LAB — POC OCCULT BLOOD, ED: Fecal Occult Bld: POSITIVE — AB

## 2017-04-23 LAB — ABO/RH: ABO/RH(D): A POS

## 2017-04-23 LAB — APTT: APTT: 33 s (ref 24–36)

## 2017-04-23 LAB — SAMPLE TO BLOOD BANK

## 2017-04-23 MED ORDER — CHLORHEXIDINE GLUCONATE CLOTH 2 % EX PADS
6.0000 | MEDICATED_PAD | Freq: Every day | CUTANEOUS | Status: DC
  Administered 2017-04-24 – 2017-04-26 (×3): 6 via TOPICAL

## 2017-04-23 MED ORDER — DEXTROSE 50 % IV SOLN
1.0000 | Freq: Once | INTRAVENOUS | Status: AC
  Administered 2017-04-23: 50 mL via INTRAVENOUS
  Filled 2017-04-23: qty 50

## 2017-04-23 MED ORDER — PANTOPRAZOLE SODIUM 40 MG IV SOLR
40.0000 mg | Freq: Two times a day (BID) | INTRAVENOUS | Status: DC
  Administered 2017-04-23 – 2017-04-25 (×5): 40 mg via INTRAVENOUS
  Filled 2017-04-23 (×5): qty 40

## 2017-04-23 MED ORDER — SODIUM CHLORIDE 0.9 % IV SOLN: Freq: Once | INTRAVENOUS | Status: DC

## 2017-04-23 MED ORDER — ALBUTEROL SULFATE (2.5 MG/3ML) 0.083% IN NEBU
10.0000 mg | INHALATION_SOLUTION | Freq: Once | RESPIRATORY_TRACT | Status: AC
  Administered 2017-04-23: 10 mg via RESPIRATORY_TRACT
  Filled 2017-04-23: qty 12

## 2017-04-23 MED ORDER — SODIUM CHLORIDE 0.9 % IV SOLN
Freq: Once | INTRAVENOUS | Status: AC
  Administered 2017-04-23: 08:00:00 via INTRAVENOUS

## 2017-04-23 MED ORDER — FUROSEMIDE 10 MG/ML IJ SOLN
20.0000 mg | Freq: Once | INTRAMUSCULAR | Status: AC
  Administered 2017-04-23: 20 mg via INTRAVENOUS
  Filled 2017-04-23: qty 2

## 2017-04-23 MED ORDER — SODIUM CHLORIDE 0.9 % IV SOLN
INTRAVENOUS | Status: DC
  Administered 2017-04-23: 04:00:00 via INTRAVENOUS

## 2017-04-23 MED ORDER — PEG 3350-KCL-NA BICARB-NACL 420 G PO SOLR
4000.0000 mL | Freq: Once | ORAL | Status: AC
  Administered 2017-04-23: 4000 mL via ORAL
  Filled 2017-04-23: qty 4000

## 2017-04-23 MED ORDER — ONDANSETRON HCL 4 MG/2ML IJ SOLN: 4.0000 mg | Freq: Four times a day (QID) | INTRAMUSCULAR | Status: DC | PRN

## 2017-04-23 MED ORDER — SODIUM CHLORIDE 0.9 % IV BOLUS (SEPSIS)
1000.0000 mL | Freq: Once | INTRAVENOUS | Status: AC
  Administered 2017-04-23: 1000 mL via INTRAVENOUS

## 2017-04-23 MED ORDER — INSULIN GLARGINE 100 UNIT/ML ~~LOC~~ SOLN
30.0000 [IU] | Freq: Every day | SUBCUTANEOUS | Status: DC
  Filled 2017-04-23 (×2): qty 0.3

## 2017-04-23 MED ORDER — DEXTROSE 50 % IV SOLN
1.0000 | Freq: Once | INTRAVENOUS | Status: AC
  Administered 2017-04-23: 50 mL via INTRAVENOUS

## 2017-04-23 MED ORDER — SODIUM CHLORIDE 0.9 % IV BOLUS (SEPSIS): 500.0000 mL | Freq: Once | INTRAVENOUS | Status: DC

## 2017-04-23 MED ORDER — SODIUM CHLORIDE 0.9% FLUSH
10.0000 mL | Freq: Two times a day (BID) | INTRAVENOUS | Status: DC
  Administered 2017-04-23 – 2017-04-26 (×7): 10 mL

## 2017-04-23 MED ORDER — SODIUM CHLORIDE 0.9 % IV SOLN
INTRAVENOUS | Status: DC
  Administered 2017-04-23 – 2017-04-24 (×3): via INTRAVENOUS

## 2017-04-23 MED ORDER — OXYCODONE HCL 5 MG PO TABS
2.5000 mg | ORAL_TABLET | ORAL | Status: DC | PRN
  Administered 2017-04-23: 5 mg via ORAL
  Filled 2017-04-23: qty 1

## 2017-04-23 MED ORDER — ALBUTEROL SULFATE (2.5 MG/3ML) 0.083% IN NEBU
2.5000 mg | INHALATION_SOLUTION | Freq: Once | RESPIRATORY_TRACT | Status: DC
  Filled 2017-04-23: qty 3

## 2017-04-23 MED ORDER — INSULIN ASPART 100 UNIT/ML IV SOLN
10.0000 [IU] | Freq: Once | INTRAVENOUS | Status: AC
  Administered 2017-04-23: 10 [IU] via INTRAVENOUS

## 2017-04-23 MED ORDER — SODIUM CHLORIDE 0.9 % IV BOLUS (SEPSIS)
500.0000 mL | Freq: Once | INTRAVENOUS | Status: AC
  Administered 2017-04-23: 500 mL via INTRAVENOUS

## 2017-04-23 MED ORDER — INSULIN ASPART 100 UNIT/ML ~~LOC~~ SOLN
0.0000 [IU] | SUBCUTANEOUS | Status: DC
Start: 2017-04-23 — End: 2017-04-23

## 2017-04-23 MED ORDER — SODIUM CHLORIDE 0.9 % IV BOLUS (SEPSIS): 1000.0000 mL | Freq: Once | INTRAVENOUS | Status: DC

## 2017-04-23 MED ORDER — DEXTROSE 50 % IV SOLN
INTRAVENOUS | Status: AC
  Filled 2017-04-23: qty 50

## 2017-04-23 MED ORDER — MUPIROCIN 2 % EX OINT
1.0000 "application " | TOPICAL_OINTMENT | Freq: Two times a day (BID) | CUTANEOUS | Status: DC
  Administered 2017-04-23 – 2017-04-26 (×6): 1 via NASAL
  Filled 2017-04-23: qty 22

## 2017-04-23 MED ORDER — SODIUM CHLORIDE 0.9% FLUSH: 10.0000 mL | INTRAVENOUS | Status: DC | PRN

## 2017-04-23 NOTE — Progress Notes (Signed)
CRITICAL VALUE ALERT  Critical Value:  K+ 6.5  Date & Time Notied:  04/23/17  Provider Notified: Dr. Irene LimboGoodrich   Orders Received/Actions taken: 1 ampule Dextrose & 10 units Insulin IV ordered.

## 2017-04-23 NOTE — Progress Notes (Signed)
Called report to ICU nurse. Transferred patient to ICU.

## 2017-04-23 NOTE — Progress Notes (Signed)
Initial Nutrition Assessment  DOCUMENTATION CODES:  Obesity unspecified  INTERVENTION:  Monitor for diet advancement and subsequent diet tolerance.  Of possible clinical importance, pt has lost 50 lbs in the past year and ~10 lbs in last couple months  NUTRITION DIAGNOSIS:  Unintentional weight loss related to chronic illness vs poor appetite vs unknown etiology as evidenced by Loss of 50 lbs x 1 year.  GOAL:  Patient will meet greater than or equal to 90% of their needs  MONITOR:  PO intake, Supplement acceptance, Diet advancement, Labs, Work up  REASON FOR ASSESSMENT:  Malnutrition Screening Tool    ASSESSMENT:  83 y/o male PMHx HTN, HLD, GERD, DM, Pacemaker and recent humerus fx, just discharged from rehab a couple days ago. At home, patient passed out and had large, bloody BM. Admitted for workup/management of GIB.   RD is operating remotely. Pt chart reviewed due to positive MST screen. Pt had reported that he was unsure if he has lost weight, but had been eating poorly due to a decreased appetite. Per chart, patient had begun feeling weak yesterday and progressively becoming more confused. There is no direct mention of a poor po intake.   Per review of weight history, he does exhibit weight loss. He was 300-305 at this time last years, which indicates a loss of ~50 lbs. Due to his obese status, this does not meet clinically significant percentage of BW. He has lost 7 lbs x 2 months which also is not clinically signifcant  At this time, patient remains NPO due to recurrent large bloody BMs with GI consult pending. Will monitor for diet advancement and subsequent tolerance.   NFPE: Unable to perform  Labs: Very hyperkalemic-6.5, Glu: 200-220, WBC:13.5, Albumin: 2.7,  Meds: IVF,    Recent Labs Lab 04/23/17 0101 04/23/17 0603  NA 135 139  K 5.8* 6.5*  CL 101 107  CO2 22 25  BUN 41* 47*  CREATININE 1.18 1.23  CALCIUM 8.1* 8.1*  GLUCOSE 222* 202*   Diet Order:  Diet  NPO time specified Except for: Sips with Meds  Skin: Abrasion to R leg  Last BM:  7/7-bloodby bm  Height:  Ht Readings from Last 1 Encounters:  04/23/17 6' (1.829 m)   Weight:  Wt Readings from Last 1 Encounters:  04/23/17 256 lb 6.4 oz (116.3 kg)   Wt Readings from Last 10 Encounters:  04/23/17 256 lb 6.4 oz (116.3 kg)  03/06/17 263 lb 7.2 oz (119.5 kg)  08/03/16 256 lb (116.1 kg)  07/15/16 262 lb (118.8 kg)  07/01/16 270 lb (122.5 kg)  05/04/16 289 lb (131.1 kg)  04/30/16 (!) 302 lb 0.5 oz (137 kg)  04/17/16 (!) 303 lb 2.1 oz (137.5 kg)  01/01/16 (!) 306 lb (138.8 kg)  09/30/15 (!) 302 lb (137 kg)   Ideal Body Weight:  80.91 kg  BMI:  Body mass index is 34.77 kg/m.  Estimated Nutritional Needs:  Kcal:  1750-1950 (15-17 kcal/kg bw) Protein:  81-97g Pro (1-1.2 g/kg ibw) Fluid:  1.8-2 L fluid (1 ml/kcal)  EDUCATION NEEDS:  No education needs identified at this time  Christophe LouisNathan Jamie Hafford RD, LDN, CNSC Clinical Nutrition Pager: [phone] 04/23/2017 9:46 AM

## 2017-04-23 NOTE — H&P (Signed)
History and Physical    Alan Briggs ZOX:[id number] DOB: 08-Mar-1922 DOA: 04/23/2017  PCP: Benita Stabile, MD  Patient coming from: home  Chief Complaint:  Passed out  HPI: Alan Briggs is a 83 y.o. male with medical history significant of recent humerus fx just d/c from [facility] rehab 2 days ago.  Son is present and reports he was walking with a cane and doing ok.  Tonight he got very dizzy upon standing and got very weak and passed out.  EMS was called, upon their arrival pt was found to have a very large blood bowel movement.  Pt states this has never happened to him before, has never had a cscope and denies any fever or abdominal pain.  No nausea or vomiting.  bp was soft on arrival to ED, a liter of ivf were given and pt bp improved to normal.  Pt referred for admission for GIB.  GI here at [facility] has been called by dr Jodelle Gross knapp for consult.  On the floor since arrival he has had another large bloody bm.  Vitals remain stable.     Review of Systems: As per HPI otherwise 10 point review of systems negative.   Past Medical History:  Diagnosis Date  . Cardiac pacemaker in situ    for sick sinus syndrome-last placement was 09/2007 [facility]  . Diabetes [facility])   . GERD (gastroesophageal reflux disease)   . Mixed hyperlipidemia   . Obstructive lung disease (HCC)    non compliant with home O2  . Pulmonary hypertension (HCC)   . Sleep apnea   . Thrombocytopenia (HCC)   . Venous insufficiency (chronic) (peripheral)     Past Surgical History:  Procedure Laterality Date  . CATARACT EXTRACTION, BILATERAL Bilateral   . KIDNEY STONE SURGERY Left    laser ablation   . PERCUTANEOUS PINNING Right 04/20/2016   Procedure: IRRIGATION AND DEBRIDEMENT RIGHT FOOT, PERCUTANEOUS PINNING SMALL TOE;  Surgeon: Tarry Kos, MD;  Location: [facility] OR;  Service: Orthopedics;  Laterality: Right;  . REFRACTIVE SURGERY Right   . ROTATOR CUFF REPAIR Left      reports that he quit smoking about 38  years ago. His smoking use included Cigarettes. He started smoking about 68 years ago. He has a 15.00 pack-year smoking history. He has never used smokeless tobacco. He reports that he does not drink alcohol or use drugs.  Allergies  Allergen Reactions  . Advair Diskus [Fluticasone-Salmeterol] Other (See Comments)    Reaction:  Unknown   . Combivent [Ipratropium-Albuterol] Other (See Comments)    Reaction:  Unknown   . Lotensin Hct [Benazepril-Hydrochlorothiazide] Other (See Comments)    Reaction:  Unknown   . Penicillins Swelling and Other (See Comments)    Reaction:  Facial swelling Has patient had a PCN reaction causing immediate rash, facial/tongue/throat swelling, SOB or lightheadedness with hypotension: Yes Has patient had a PCN reaction causing severe rash involving mucus membranes or skin necrosis: No Has patient had a PCN reaction that required hospitalization No Has patient had a PCN reaction occurring within the last 10 years: No If all of the above answers are "NO", then may proceed with Cephalosporin use.  . Simvastatin Other (See Comments)    Reaction:  Muscle pain     Family History  Problem Relation Age of Onset  . Diabetes Mother     Prior to Admission medications   Medication Sig Start Date End Date Taking? Authorizing Provider  acetaminophen (TYLENOL)  500 MG tablet Take 1,000 mg by mouth every 6 (six) hours as needed for mild pain, moderate pain or headache.     [provider]  aspirin EC 81 MG tablet Take 81 mg by mouth daily.     [provider]  atenolol (TENORMIN) 50 MG tablet Take 25 mg by mouth daily.     [provider]  furosemide (LASIX) 20 MG tablet Take 20 mg by mouth daily.     [provider]  insulin glargine (LANTUS) 100 UNIT/ML injection Inject 0.3 mLs (30 Units total) into the skin at bedtime. 03/08/17   Filbert SchilderKadolph, Alexandria U, MD  insulin lispro (HUMALOG JUNIOR KWIKPEN) 100 UNIT/ML KiwkPen Inject 12-13 Units into  the skin daily.     [provider]  Multiple Vitamin (MULTIVITAMIN WITH MINERALS) TABS tablet Take 1 tablet by mouth daily.    [provider]  Multiple Vitamins-Minerals (ICAPS AREDS 2) CAPS Take 1 capsule by mouth daily.    [provider]  oxyCODONE (OXY IR/ROXICODONE) 5 MG immediate release tablet Take 0.5-1 tablets (2.5-5 mg total) by mouth every 4 (four) hours as needed for moderate pain. 03/08/17   Filbert SchilderKadolph, Alexandria U, MD  polyethylene glycol [facility](MIRALAX / Ethelene HalGLYCOLAX) packet Take 17 g by mouth daily as needed for mild constipation. 03/08/17   Filbert SchilderKadolph, Alexandria U, MD    Physical Exam: Vitals:   04/23/17 0130 04/23/17 0145 04/23/17 0200 04/23/17 0248  BP: 102/73  107/73 (!) 117/52  Pulse: 79 62 62 69  Resp: 18 (!) 23 (!) 22 (!) 24  Temp:    97.7 F (36.5 C)  TempSrc:    Oral  SpO2: 95% 100% 100% 100%  Weight:    116.3 kg (256 lb 6.4 oz)  Height:    6' (1.829 m)    Constitutional: NAD, calm, comfortable Vitals:   04/23/17 0130 04/23/17 0145 04/23/17 0200 04/23/17 0248  BP: 102/73  107/73 (!) 117/52  Pulse: 79 62 62 69  Resp: 18 (!) 23 (!) 22 (!) 24  Temp:    97.7 F (36.5 C)  TempSrc:    Oral  SpO2: 95% 100% 100% 100%  Weight:    116.3 kg (256 lb 6.4 oz)  Height:    6' (1.829 m)   Eyes: PERRL, lids and conjunctivae normal ENMT: Mucous membranes are moist. Posterior pharynx clear of any exudate or lesions.Normal dentition.  Neck: normal, supple, no masses, no thyromegaly Respiratory: clear to auscultation bilaterally, no wheezing, no crackles. Normal respiratory effort. No accessory muscle use.  Cardiovascular: Regular rate and rhythm, no murmurs / rubs / gallops. No extremity edema. 2+ pedal pulses. No carotid bruits.  Abdomen: no tenderness, no masses palpated. No hepatosplenomegaly. Bowel sounds positive.  Musculoskeletal: no clubbing / cyanosis. No joint deformity upper and lower extremities. Good ROM, no contractures. Normal muscle tone.    Skin: no rashes, lesions, ulcers. No induration Neurologic: CN 2-12 grossly intact. Sensation intact, DTR normal. Strength 5/5 in all 4.  Psychiatric: Normal judgment and insight. Alert and oriented x 3. Normal mood.    Labs on Admission: I have personally reviewed following labs and imaging studies  CBC:  Recent Labs Lab 04/23/17 0101  WBC 11.3*  NEUTROABS 9.0*  HGB 10.3*  HCT 32.0*  MCV 99.7  PLT 181   Basic Metabolic Panel:  Recent Labs Lab 04/23/17 0101  NA 135  K 5.8*  CL 101  CO2 22  GLUCOSE 222*  BUN 41*  CREATININE 1.18  CALCIUM  8.1*   GFR: Estimated Creatinine Clearance: 51.5 mL/min (by C-G formula based on SCr of 1.18 mg/dL). Liver Function Tests:  Recent Labs Lab 04/23/17 0101  AST 34  ALT 26  ALKPHOS 78  BILITOT 1.1  PROT 5.5*  ALBUMIN 2.7*   No results for input(s): LIPASE, AMYLASE in the last 168 hours. No results for input(s): AMMONIA in the last 168 hours. Coagulation Profile:  Recent Labs Lab 04/23/17 0101  INR 1.32   Cardiac Enzymes: No results for input(s): CKTOTAL, CKMB, CKMBINDEX, TROPONINI in the last 168 hours. BNP (last 3 results) No results for input(s): PROBNP in the last 8760 hours. HbA1C: No results for input(s): HGBA1C in the last 72 hours. CBG: No results for input(s): GLUCAP in the last 168 hours. Lipid Profile: No results for input(s): CHOL, HDL, LDLCALC, TRIG, CHOLHDL, LDLDIRECT in the last 72 hours. Thyroid Function Tests: No results for input(s): TSH, T4TOTAL, FREET4, T3FREE, THYROIDAB in the last 72 hours. Anemia Panel: No results for input(s): VITAMINB12, FOLATE, FERRITIN, TIBC, IRON, RETICCTPCT in the last 72 hours. Urine analysis:    Component Value Date/Time   COLORURINE AMBER (A) 04/20/2016 0130   APPEARANCEUR CLEAR 04/20/2016 0130   LABSPEC 1.025 04/20/2016 0130   PHURINE 5.0 04/20/2016 0130   GLUCOSEU NEGATIVE 04/20/2016 0130   HGBUR MODERATE (A) 04/20/2016 0130   BILIRUBINUR NEGATIVE  04/20/2016 0130   KETONESUR NEGATIVE 04/20/2016 0130   PROTEINUR NEGATIVE 04/20/2016 0130   NITRITE NEGATIVE 04/20/2016 0130   LEUKOCYTESUR TRACE (A) 04/20/2016 0130   Sepsis Labs: !!!!!!!!!!!!!!!!!!!!!!!!!!!!!!!!!!!!!!!!!!!! @LABRCNTIP (procalcitonin:4,lacticidven:4) )No results found for this or any previous visit (from the past 240 hour(s)).   Radiological Exams on Admission: No results found.   Assessment/Plan 83 yo male with acute lower gib  Principal Problem:   GIB (gastrointestinal bleeding)- hgb stable at 10.  Give another liter ivf bolus.  Type and screen now and transfuse if becomes hypotensive or hgb drops below 7.  Gi has been consulted.  Will keep npo in case the need for scoping.  Hold aspirin.  Hopefully he will stop bleeding with a watch and wait approach as he is a very poor surgical candidate.  Check another h/h now.  He is agreeable to transfusion if needed.  Active Problems:   Pacemaker- noted   Chronic diastolic heart failure (HCC)- holding diuretics at this time in the setting of acute blood loss.  Assess the need for resuming this on a daily basis   Chronic obstructive pulmonary disease (HCC)- stable   OSA (obstructive sleep apnea)-stable   Controlled diabetes mellitus type 2 with complications (HCC)- SSI   Hyperkalemia- likely resolve with ivf   HTN- holding bblocker in the setting of syncope likely due to orthostasis from blood loss.     DVT prophylaxis: scds  Code Status:  full Family Communication: none  Disposition Plan:  Per day team  Consults called:  GI Admission status: admission    Vi Biddinger A MD [facility]  If 7PM-7AM, please contact night-coverage [email or web address] Password [email or web address]  04/23/2017, 2:57 AM

## 2017-04-23 NOTE — ED Triage Notes (Signed)
Pt in by [facility] after he had a syncopal episode at home while trying to get to the bathroom.  While patient was being moved from ems stretcher to e.r. Bed, pt is noted to have large amt of blood from his bottom.   Pt denies that he has had rectal bleeding, but state he does have a sore on his bottom.

## 2017-04-23 NOTE — Progress Notes (Signed)
PROGRESS NOTE  Alan Briggs JYN:[id number] DOB: April 07, 1922 DOA: 04/23/2017 PCP: Benita Stabile, MD  Brief Narrative: 83 year old man PMH pacemaker, diabetes, pulmonary hypertension, just released from short-term rehabilitation July 4, presented with syncope, dizziness, large bloody bowel movement at home. Admitted for GI bleed.  Assessment/Plan #1: Lower GI bleed with associated acute blood loss anemia and hemorrhagic shock, no previous history, no previous endoscopy. -Transfer to stepdown. Given hypotension, central line placed. -GI consultation appreciated -Trend hemoglobin, transfuse as appropriate  #2: Hemorrhagic shock -Transfuse 2 units packed red blood cells now, type and cross additional 2 units now  #3: Acute blood loss anemia -Plan as above, trend hemoglobin  #4: Hyperkalemia. Etiology unclear. Not on any medications that would cause this. Renal function is preserved. -Asymptomatic. No arrhythmias noted. EKG shows a paced rhythm but no acute changes are noted. T waves appear similar previous study. -Insulin, D50, albuterol treatment, follow potassium closely throughout the day.   #5: Diabetes mellitus. Appears stable at this point. Sliding scale insulin.  PMH Pacemaker, chronic diastolic congestive heart failure, COPD, obstructive sleep apnea. These issues appear stable at this point.  Discussed with son at bedside. Patient critically ill, transfer to stepdown, plan as above.  DVT prophylaxis: SCDs Code Status: full confirmed today with patietn Family Communication: son Baldo Ash at bedside Disposition Plan: pending    Brendia Sacks, MD  [facility] Direct contact: [phone] --Via amion app OR  --[email or web address]; password [email or web address]  7PM-7AM contact night coverage as above 04/23/2017, 9:45 AM  LOS: 0 days   Consultants:  Gastroenterology  Procedures:  7/7 Transfusion 2 units packed red blood cells  7/7 femoral line insertion  Antimicrobials:    Interval  history/Subjective: 3 bloody bowel movements on the floor per nursing. Now hypotensive per nursing.  Patient denies abdominal pain. Does have nausea. Persistent left arm pain.  Objective: Vitals: 97.7, 64, 20, 88/46, 97% on 2 L  Exam:     Constitutional: Appears acutely ill.  Psychiatric: Alert, speech fluent and clear, appropriate. Grossly normal mood and affect.  Eyes: Pupils, irises, lids unremarkable  ENT: Slightly hard of hearing. Lips and tongue appear unremarkable.  Cardiovascular: Regular rate and rhythm. No murmur, rub or gallop. 2+ bilateral lower extremity edema. Telemetry paced rhythm.  Respiratory: Clear to auscultation bilaterally. No wheezes, rales or rhonchi. Mild increased respiratory effort.  Abdomen obese, soft, nontender, nondistended.  Skin: No rash or induration noted. Nontender to palpation.   I have personally reviewed the following:   Labs:  Capillary blood sugars stable  Potassium 6.5, creatinine 1.23.  Hemoglobin 8.5, trending down.  Imaging studies:    Medical tests:  EKG paced rhythm. No acute changes compared to previous study same day. Independently reviewed.   Test discussed with performing physician:    Decision to obtain old records:    Review and summation of old records:    Scheduled Meds: . albuterol  2.5 mg Nebulization Once  . sodium chloride flush  10-40 mL Intracatheter Q12H   Continuous Infusions: . sodium chloride    . sodium chloride      Principal Problem:   GIB (gastrointestinal bleeding) Active Problems:   Pacemaker   Chronic diastolic heart failure (HCC)   Chronic obstructive pulmonary disease (HCC)   OSA (obstructive sleep apnea)   Controlled diabetes mellitus type 2 with complications (HCC)   Hyperkalemia   Hemorrhagic shock (HCC)   Acute blood loss anemia   LOS: 0 days    Critical care time: 65 minutes.  Includes discussions with gastric neurology, general surgery for line placement,  stabilization of the patient with blood products and bedside evaluation.

## 2017-04-23 NOTE — Consult Note (Signed)
Referring Provider: Dr. Irene LimboGoodrich  Primary Care Physician:  Benita StabileHall, John Z, MD Primary Gastroenterologist:  Dr. Jena Gaussourk   Date of Admission: 04/23/17 Date of Consultation: 04/23/17  Reason for Consultation:  GI bleed  HPI:  Alan Briggs is a very pleasant 83 y.o. year old male who was recently discharged from Rehab last Wednesday to home, after sustaining left humerus fracture in May 2018. He lives by himself at his home, with family members assisting with care. His son, Baldo AshCarl, is present at bedside at time of consultation and provided the majority of the health history.   Son notes that since discharge on Wednesday, patient has felt fatigued and "eating like a bird". Yesterday evening, patient got up to use the restroom with son at side and had a syncopal episode lasting 45 seconds to 1 minute per son. Son states he noted spots of blood on the floor where patient had been. ED notes report EMS relaying large amount of bright red blood on floor. Since admission, he has had about 5 burgundy bloody bowel movements. Admitting Hgb 10.3, which is down from a month ago in the 12 range. This morning, Hgb 8.5. Last bloody BM just before 9 am. Potassium 6.5, BUN elevated at 47 with creatinine normal at 1.23. INR normal. Hypotensive with systolic in 80s, now in upper 16X90s at time of consultation. Receiving 1 of 2 units of PRBCs. Denies abdominal pain but feels uncomfortable upper abdomen. Son reports colonoscopy in remote past many years ago that was "normal". No prior EGD that he is aware. Denies NSAIDs. Takes an 81 mg aspirin daily. No anticoagulation. CT abd/pelvis July 2017 without evidence of chronic liver disease, diverticula, and a stable possible side branch IPMN noted.   Patient states he feels "low". Denies chest pain. Notes DOE, otherwise no shortness of breath currently. Feels fatigued. Joking at bedside and discussing his wife, who was an Charity fundraiserN here at [employer] long ago and passed away 5 years ago.   Past  Medical History:  Diagnosis Date  . Cardiac pacemaker in situ    for sick sinus syndrome-[facility]  . Diabetes [facility](HCC)   . GERD (gastroesophageal reflux disease)   . Mixed hyperlipidemia   . Obstructive lung disease (HCC)    non compliant with home O2  . Pulmonary hypertension (HCC)   . Sleep apnea   . Thrombocytopenia (HCC)   . Venous insufficiency (chronic) (peripheral)     Past Surgical History:  Procedure Laterality Date  . CATARACT EXTRACTION, BILATERAL Bilateral   . KIDNEY STONE SURGERY Left    laser ablation   . PACEMAKER IMPLANT    . PERCUTANEOUS PINNING Right 04/20/2016   Procedure: IRRIGATION AND DEBRIDEMENT RIGHT FOOT, PERCUTANEOUS PINNING SMALL TOE;  Surgeon: Tarry KosNaiping M Xu, MD;  Location: [facility] OR;  Service: Orthopedics;  Laterality: Right;  . REFRACTIVE SURGERY Right   . ROTATOR CUFF REPAIR Left     Prior to Admission medications   Medication Sig Start Date End Date Taking? Authorizing Provider  acetaminophen (TYLENOL) 500 MG tablet Take 1,000 mg by mouth every 6 (six) hours as needed for mild pain, moderate pain or headache.     [provider]  aspirin EC 81 MG tablet Take 81 mg by mouth daily.     [provider]  atenolol (TENORMIN) 50 MG tablet Take 25 mg by mouth daily.     [provider]  furosemide (LASIX) 20 MG tablet Take 20 mg by mouth  daily.     [provider]  insulin glargine (LANTUS) 100 UNIT/ML injection Inject 0.3 mLs (30 Units total) into the skin at bedtime. 03/08/17   Filbert Schilder, MD  insulin lispro (HUMALOG JUNIOR KWIKPEN) 100 UNIT/ML KiwkPen Inject 12-13 Units into the skin daily.     [provider]  Multiple Vitamin (MULTIVITAMIN WITH MINERALS) TABS tablet Take 1 tablet by mouth daily.    [provider]  Multiple Vitamins-Minerals (ICAPS AREDS 2) CAPS Take 1 capsule by mouth daily.    [provider]  oxyCODONE (OXY IR/ROXICODONE) 5 MG  immediate release tablet Take 0.5-1 tablets (2.5-5 mg total) by mouth every 4 (four) hours as needed for moderate pain. 03/08/17   Filbert Schilder, MD  polyethylene glycol [facility] Ethelene Hal) packet Take 17 g by mouth daily as needed for mild constipation. 03/08/17   Filbert Schilder, MD    Current Facility-Administered Medications  Medication Dose Route Frequency Provider Last Rate Last Dose  . 0.9 %  sodium chloride infusion   Intravenous Once Tarry Kos A, MD      . albuterol (PROVENTIL) (2.5 MG/3ML) 0.083% nebulizer solution 2.5 mg  2.5 mg Nebulization Once Standley Brooking, MD      . ondansetron [facility]) injection 4 mg  4 mg Intravenous Q6H PRN Standley Brooking, MD      . sodium chloride 0.9 % bolus 1,000 mL  1,000 mL Intravenous Once Tarry Kos A, MD      . sodium chloride flush (NS) 0.9 % injection 10-40 mL  10-40 mL Intracatheter Q12H Franky Macho, MD      . sodium chloride flush (NS) 0.9 % injection 10-40 mL  10-40 mL Intracatheter PRN Franky Macho, MD        Allergies as of 04/23/2017 - Review Complete 04/23/2017  Allergen Reaction Noted  . Advair diskus [fluticasone-salmeterol] Other (See Comments)   . Combivent [ipratropium-albuterol] Other (See Comments)   . Lotensin hct [benazepril-hydrochlorothiazide] Other (See Comments)   . Penicillins Swelling and Other (See Comments)   . Simvastatin Other (See Comments)     Family History  Problem Relation Age of Onset  . Diabetes Mother   . Colon cancer Neg Hx   . Colon polyps Neg Hx     Social History   Social History  . Marital status: Married    Spouse name: N/A  . Number of children: N/A  . Years of education: N/A   Occupational History  . Not on file.   Social History Main Topics  . Smoking status: Former Smoker    Packs/day: 0.50    Years: 30.00    Types: Cigarettes    Start date: 10/18/1948    Quit date: 10/18/1978  . Smokeless tobacco: Never Used  . Alcohol use No  . Drug use: No  .  Sexual activity: No   Other Topics Concern  . Not on file   Social History Narrative  . No narrative on file    Review of Systems: As mentioned in HPI  Physical Exam: Vital signs in last 24 hours: Temp:  [97.5 F (36.4 C)-98.4 F (36.9 C)] 98.4 F (36.9 C) (07/07 0817) Pulse Rate:  [59-149] 64 (07/07 0817) Resp:  [16-24] 16 (07/07 0817) BP: (88-117)/(44-73) 98/48 (07/07 0817) SpO2:  [89 %-100 %] 100 % (07/07 0817) Weight:  [256 lb 6.4 oz (116.3 kg)] 256 lb 6.4 oz (116.3 kg) (07/07 0248) Last BM Date: 04/23/17 General:   Resting with eyes  closed but easily awakens, pale, no distress, appears stated age Head:  Normocephalic and atraumatic. Eyes:  Sclera clear, no icterus.   Ears:  Normal auditory acuity. Nose:  No deformity, discharge,  or lesions. Mouth:  No deformity or lesions, Lungs:  Clear throughout to auscultation.   Heart:  S1 S2 present, paced rhythm  Abdomen:  Soft, obese, TTP upper abdomen and nondistended. No masses, hepatosplenomegaly or hernias noted. Normal bowel sounds, without guarding, and without rebound.   Rectal:  Deferred until time of colonoscopy.   Msk:  Symmetrical without gross deformities. Normal posture. Extremities:  With 2+ pitting pedal edema  Neurologic:  Alert and  oriented x4 Psych:  Alert and cooperative.   Intake/Output from previous day: 07/06 0701 - 07/07 0700 In: 1000 [IV Piggyback:1000] Out: 201 [Urine:200; Stool:1] Intake/Output this shift: Total I/O In: 335 [Blood:335] Out: -   Lab Results:  Recent Labs  04/23/17 0101 04/23/17 0603  WBC 11.3* 13.5*  HGB 10.3* 8.5*  HCT 32.0* 26.1*  PLT 181 178   BMET  Recent Labs  04/23/17 0101 04/23/17 0603  NA 135 139  K 5.8* 6.5*  CL 101 107  CO2 22 25  GLUCOSE 222* 202*  BUN 41* 47*  CREATININE 1.18 1.23  CALCIUM 8.1* 8.1*   LFT  Recent Labs  04/23/17 0101  PROT 5.5*  ALBUMIN 2.7*  AST 34  ALT 26  ALKPHOS 78  BILITOT 1.1   PT/INR  Recent Labs   04/23/17 0101  LABPROT 16.5*  INR 1.32    Impression: 83 year old male admitted with acute GI bleed, unable to rule out rapid upper transit upper GI bleed, hemodynamically significant, multiple bloody burgundy bowel movements since admission and 1 of 2 units PRBCs currently infusing. Central line access obtained and transfer to ICU in process. No anticoagulation, NSAIDs, aspirin powders, only endorsing 81 mg aspirin daily. Last colonoscopy in remote past, no prior EGD. CT on file from last year without obvious diverticula noted, no evidence of liver disease, no aneursym. Hyperkalemia noted and trending up, unclear etiology. Son, Baldo Ash, at bedside. Patient and son are desirous of any intervention that would "make him better" with the ultimate goal of transfer back to home in the future. At time of consultation, although critically ill, patient is conversant, alert, oriented.   Continue supportive measures, transfusion, serial H/H, with serial monitoring of potassium due to hyperkalemia. Will have blood blank keep 2 units ahead. Will need endoscopic evaluation with EGD followed by colonoscopy on July 8th if no acute changes with potassium and trending down.   Plan: 4 liters golytely now Tap water enema X 2 in am May have ice chips NPO after midnight Serial monitoring of potassium: will continue to follow. Procedure may need to be postponed if persistently elevated or worsening EGD and TCS on 04/24/17 by Dr. Jena Gauss with conscious sedation. PPI IV BID  Keep 2 units ahead in blood bank: discussed with blood bank, who has the 2 assigned to him and 2 additional on hold. Will be receiving additional inventory this afternoon.  Will continue to follow with you  Gelene Mink, PhD, ANP-BC [facility]      LOS: 0 days    04/23/2017, 9:31 AM

## 2017-04-23 NOTE — ED Provider Notes (Signed)
[facility]-EMERGENCY DEPT Provider Note   CSN: [id number] Arrival date & time: 04/23/17  0025  Time seen 12:45 AM   History   Chief Complaint Chief Complaint  Patient presents with  . Loss of Consciousness  . GI Bleeding    HPI Alan Briggs is a 83 y.o. male.  HPI  patient had a fall in May and fractured his humerus and he was in the [facility] for rehabilitation until he was discharged on July 4. His son has been helping take care of him and they use a safety belt to help him get up and down and go to the bathroom. Patient had complained of feeling weak today without anything specific. Son states earlier in the evening they were talking and he thought his father was a little confused stating that he was stating pasta was good for a high blood sugar which the son states his father knows is not true. Just prior to arrival they were trying to go to the bathroom and his son had used a safety belt and was assisting him. The patient states just as they were getting close to the bathroom he felt like his legs were melting underneath him and he felt weak. He told his son to take him to the chair that was close by. He then started feeling lightheaded and dizzy and feeling "fainty". And then he did have a syncopal episode and went to the floor. Son states he was unconscious about 30-45 seconds. He states he was extremely pale but not diaphoretic. On EMS arrival patient was noted to have a large amount of bright red blood consistent with a GI bleed. Patient states he's never had that happen before. He states he's never had a colonoscopy before. He denies being on any blood thinners. Patient states last time he felt this way was when the battery on his pacemaker needed to be replaced.  PCP Benita Stabile, MD Cardiology for pacemaker Dr Ladona Ridgel   Past Medical History:  Diagnosis Date  . Cardiac pacemaker in situ    for sick sinus syndrome-last placement was 09/2007 [facility]  . Diabetes [place])   .  GERD (gastroesophageal reflux disease)   . Mixed hyperlipidemia   . Obstructive lung disease (HCC)    non compliant with home O2  . Pulmonary hypertension (HCC)   . Sleep apnea   . Thrombocytopenia (HCC)   . Venous insufficiency (chronic) (peripheral)     Patient Active Problem List   Diagnosis Date Noted  . GIB (gastrointestinal bleeding) 04/23/2017  . Humerus fracture 03/06/2017  . Essential hypertension 03/06/2017  . AKI (acute kidney injury) (HCC) 03/06/2017  . Ileus (HCC) 04/30/2016  . Acute renal failure (HCC)   . Pancreatic mass   . Sepsis, unspecified organism (HCC)   . Chronic obstructive pulmonary disease (HCC)   . OSA (obstructive sleep apnea)   . Controlled diabetes mellitus type 2 with complications (HCC)   . Sick sinus syndrome (HCC)   . Presence of permanent cardiac pacemaker   . Foot laceration   . Dislocation of fifth toe, right, closed   . Abdominal pain 04/17/2016  . UTI (lower urinary tract infection) 04/17/2016  . Pacemaker 09/30/2015  . Chronic diastolic heart failure (HCC) 09/30/2015  . Venous insufficiency 09/30/2015    Past Surgical History:  Procedure Laterality Date  . CATARACT EXTRACTION, BILATERAL Bilateral   . KIDNEY STONE SURGERY Left    laser ablation   . PERCUTANEOUS PINNING Right 04/20/2016  Procedure: IRRIGATION AND DEBRIDEMENT RIGHT FOOT, PERCUTANEOUS PINNING SMALL TOE;  Surgeon: Tarry Kos, MD;  Location: [facility] OR;  Service: Orthopedics;  Laterality: Right;  . REFRACTIVE SURGERY Right   . ROTATOR CUFF REPAIR Left        Home Medications    Prior to Admission medications   Medication Sig Start Date End Date Taking? Authorizing Provider  acetaminophen (TYLENOL) 500 MG tablet Take 1,000 mg by mouth every 6 (six) hours as needed for mild pain, moderate pain or headache.     [provider]  aspirin EC 81 MG tablet Take 81 mg by mouth daily.     [provider]  atenolol (TENORMIN) 50 MG tablet Take 25 mg by mouth  daily.     [provider]  furosemide (LASIX) 20 MG tablet Take 20 mg by mouth daily.     [provider]  insulin glargine (LANTUS) 100 UNIT/ML injection Inject 0.3 mLs (30 Units total) into the skin at bedtime. 03/08/17   Filbert Schilder, MD  insulin lispro (HUMALOG JUNIOR KWIKPEN) 100 UNIT/ML KiwkPen Inject 12-13 Units into the skin daily.     [provider]  Multiple Vitamin (MULTIVITAMIN WITH MINERALS) TABS tablet Take 1 tablet by mouth daily.    [provider]  Multiple Vitamins-Minerals (ICAPS AREDS 2) CAPS Take 1 capsule by mouth daily.    [provider]  oxyCODONE (OXY IR/ROXICODONE) 5 MG immediate release tablet Take 0.5-1 tablets (2.5-5 mg total) by mouth every 4 (four) hours as needed for moderate pain. 03/08/17   Filbert Schilder, MD  polyethylene glycol [facility] Ethelene Hal) packet Take 17 g by mouth daily as needed for mild constipation. 03/08/17   Filbert Schilder, MD    Family History Family History  Problem Relation Age of Onset  . Diabetes Mother     Social History Social History  Substance Use Topics  . Smoking status: Former Smoker    Packs/day: 0.50    Years: 30.00    Types: Cigarettes    Start date: 10/18/1948    Quit date: 10/18/1978  . Smokeless tobacco: Never Used  . Alcohol use No  living at home   Allergies   Advair diskus [fluticasone-salmeterol]; Combivent [ipratropium-albuterol]; Lotensin hct [benazepril-hydrochlorothiazide]; Penicillins; and Simvastatin   Review of Systems Review of Systems  All other systems reviewed and are negative.    Physical Exam Updated Vital Signs BP 94/67 (BP Location: Right Wrist)   Pulse 73   Temp (!) 97.5 F (36.4 C) (Oral)   Resp (!) 24   SpO2 100%   Vital signs normal except for hypotension   Physical Exam  Constitutional: He is oriented to person, place, and time. He appears well-developed and well-nourished.  Non-toxic appearance. He does not  appear ill. No distress.  HENT:  Head: Normocephalic and atraumatic.  Right Ear: External ear normal.  Left Ear: External ear normal.  Nose: Nose normal. No mucosal edema or rhinorrhea.  Mouth/Throat: Oropharynx is clear and moist and mucous membranes are normal. No dental abscesses or uvula swelling.  Eyes: Conjunctivae and EOM are normal. Pupils are equal, round, and reactive to light.  Pale conjunctiva  Neck: Normal range of motion and full passive range of motion without pain. Neck supple.  Cardiovascular: Normal rate, regular rhythm and normal heart sounds.  Exam reveals no gallop and no friction rub.   No murmur heard. Pulmonary/Chest: Effort normal and breath sounds normal. No respiratory distress. He has no wheezes.  He has no rhonchi. He has no rales. He exhibits no tenderness and no crepitus.  Abdominal: Soft. Normal appearance and bowel sounds are normal. He exhibits no distension. There is no tenderness. There is no rebound and no guarding.  Musculoskeletal: Normal range of motion. He exhibits no edema or tenderness.  Moves all extremities well.   Neurological: He is alert and oriented to person, place, and time. He has normal strength. No cranial nerve deficit.  Skin: Skin is warm, dry and intact. No rash noted. No erythema. There is pallor.  Psychiatric: He has a normal mood and affect. His speech is normal and behavior is normal. His mood appears not anxious.  Nursing note and vitals reviewed.    ED Treatments / Results  Labs (all labs ordered are listed, but only abnormal results are displayed) Results for orders placed or performed during the hospital encounter of 04/23/17  Comprehensive metabolic panel  Result Value Ref Range   Sodium 135 135 - 145 mmol/L   Potassium 5.8 (H) 3.5 - 5.1 mmol/L   Chloride 101 101 - 111 mmol/L   CO2 22 22 - 32 mmol/L   Glucose, Bld 222 (H) 65 - 99 mg/dL   BUN 41 (H) 6 - 20 mg/dL   Creatinine, Ser 0.271.18 0.61 - 1.24 mg/dL   Calcium 8.1  (L) 8.9 - 10.3 mg/dL   Total Protein 5.5 (L) 6.5 - 8.1 g/dL   Albumin 2.7 (L) 3.5 - 5.0 g/dL   AST 34 15 - 41 U/L   ALT 26 17 - 63 U/L   Alkaline Phosphatase 78 38 - 126 U/L   Total Bilirubin 1.1 0.3 - 1.2 mg/dL   GFR calc non Af Amer 51 (L) >60 mL/min   GFR calc Af Amer 59 (L) >60 mL/min   Anion gap 12 5 - 15  CBC with Differential  Result Value Ref Range   WBC 11.3 (H) 4.0 - 10.5 K/uL   RBC 3.21 (L) 4.22 - 5.81 MIL/uL   Hemoglobin 10.3 (L) 13.0 - 17.0 g/dL   HCT 25.332.0 (L) 66.439.0 - 40.352.0 %   MCV 99.7 78.0 - 100.0 fL   MCH 32.1 26.0 - 34.0 pg   MCHC 32.2 30.0 - 36.0 g/dL   RDW 47.415.3 25.911.5 - 56.315.5 %   Platelets 181 150 - 400 K/uL   Neutrophils Relative % 80 %   Neutro Abs 9.0 (H) 1.7 - 7.7 K/uL   Lymphocytes Relative 11 %   Lymphs Abs 1.2 0.7 - 4.0 K/uL   Monocytes Relative 9 %   Monocytes Absolute 1.0 0.1 - 1.0 K/uL   Eosinophils Relative 0 %   Eosinophils Absolute 0.1 0.0 - 0.7 K/uL   Basophils Relative 0 %   Basophils Absolute 0.0 0.0 - 0.1 K/uL  Protime-INR  Result Value Ref Range   Prothrombin Time 16.5 (H) 11.4 - 15.2 seconds   INR 1.32   APTT  Result Value Ref Range   aPTT 33 24 - 36 seconds  POC occult blood, ED RN will collect  Result Value Ref Range   Fecal Occult Bld POSITIVE (A) NEGATIVE  Sample to Blood Bank  Result Value Ref Range   Blood Bank Specimen SAMPLE AVAILABLE FOR TESTING    Sample Expiration 04/24/2017    Laboratory interpretation all normal except mild drop in his baseline HB from May, increased BUN c/w GI bleeding   EKG  EKG Interpretation  Date/Time:  Saturday April 23 2017 00:40:03 EDT Ventricular Rate:  80 PR Interval:    QRS Duration: 142 QT Interval:  438 QTC Calculation: 506 R Axis:   -82 Text Interpretation:  Afib/flutter and ventricular-paced rhythm No further analysis attempted due to paced rhythm No significant change since last tracing 05 Mar 2017 Confirmed by Devoria Albe ([id number]) on 04/23/2017 12:43:57 AM       Radiology No  results found.    Dg Shoulder Left  Result Date: 04/12/2017 [facility] lateral of the left shoulder Left proximal humerus fracture The metaphyseal diaphyseal area is severely comminuted. The shaft is pulled medially most likely by the pectoralis There is comminution in the metaphyseal region and extends up into the greater tuberosity and perhaps even into the humeral head Lateral x-ray shows normal axial alignment Impression healing proximal humerus fracture with 50% medial displacement and 8.7 mm distraction  Procedures Procedures (including critical care time)  CRITICAL CARE Performed by: Marny Smethers L Jinelle Butchko Total critical care time: 32 minutes Critical care time was exclusive of separately billable procedures and treating other patients. Critical care was necessary to treat or prevent imminent or life-threatening deterioration. Critical care was time spent personally by me on the following activities: development of treatment plan with patient and/or surrogate as well as nursing, discussions with consultants, evaluation of patient's response to treatment, examination of patient, obtaining history from patient or surrogate, ordering and performing treatments and interventions, ordering and review of laboratory studies, ordering and review of radiographic studies, pulse oximetry and re-evaluation of patient's condition.   Medications Ordered in ED Medications  sodium chloride 0.9 % bolus 1,000 mL (1,000 mLs Intravenous New Bag/Given 04/23/17 0055)     Initial Impression / Assessment and Plan / ED Course  I have reviewed the triage vital signs and the nursing notes.  Pertinent labs & imaging results that were available during my care of the patient were reviewed by me and considered in my medical decision making (see chart for details).  Patient was given a liter of IV fluids for his hypotension. When I look in his chart he did have a CT scan of his abdomen/pelvis in November 2017, there is no comment on  diverticulosis being present. Patient's last H&H on May 20 was 12.4/36.7.  02:12 AM Dr Onalee Hua, hospitalist, will admit  04:50 AM GI has not called back.   Final Clinical Impressions(s) / ED Diagnoses   Final diagnoses:  Gastrointestinal hemorrhage, unspecified gastrointestinal hemorrhage type  Syncope, unspecified syncope type  Hypotension, unspecified hypotension type    Plan admission  Devoria Albe, MD, Concha Pyo, MD 04/23/17 [phone]

## 2017-04-23 NOTE — Progress Notes (Signed)
Patient has had a total of three bloody bowel movements since arrived on floor.  Patient received bolus earlier in shift and blood pressure was stable at this time.  At around 0645, patient had another bloody bowel movement and pressure was low.  Patient has no c/o chest pain or dizziness, but just states he in general feels bad, and c/o pain in left arm from fall.  Notified MD of patient condition.

## 2017-04-23 NOTE — Progress Notes (Signed)
Patient had a large bloody maroon colored bowel movement and vomitted.  Moderate amount of Vomit noted was clear with brown/burgundy specks in it.

## 2017-04-23 NOTE — ED Notes (Signed)
Pt had large bloody bm. Pt was cleaned and fresh linen applied.

## 2017-04-23 NOTE — Procedures (Signed)
Alan CollumRobert C Briggs is a 83 y.o. male patient.  Surgery was consulted for emergent central line access for hypotension and GI bleed. 1. Gastrointestinal hemorrhage, unspecified gastrointestinal hemorrhage type   2. Syncope, unspecified syncope type   3. Hypotension, unspecified hypotension type    Past Medical History:  Diagnosis Date  . Cardiac pacemaker in situ    for sick sinus syndrome-[facility]  . Diabetes [facility](HCC)   . GERD (gastroesophageal reflux disease)   . Mixed hyperlipidemia   . Obstructive lung disease (HCC)    non compliant with home O2  . Pulmonary hypertension (HCC)   . Sleep apnea   . Thrombocytopenia (HCC)   . Venous insufficiency (chronic) (peripheral)    Blood pressure (!) 97/44, pulse (!) 59, temperature 98.1 F (36.7 C), resp. rate 17, height 6' (1.829 m), weight 256 lb 6.4 oz (116.3 kg), SpO2 100 %.  Procedures Consent obtained from the patient. Right groin prepped and draped using the usual sterile technique with ChloraPrep. Surgical site confirmation was performed. One percent Xylocaine was used for local anesthesia. A right femoral triple-lumen catheter was placed into the right femoral vein using the Seldinger technique without difficulty. Good backflow was noted on all 3 ports. All 3 ports were flushed with saline. Patient tolerated the procedure well. Franky MachoMark Stavros Cail 04/23/2017

## 2017-04-24 ENCOUNTER — Encounter (HOSPITAL_COMMUNITY): Payer: Self-pay

## 2017-04-24 ENCOUNTER — Encounter (HOSPITAL_COMMUNITY)
Admission: EM | Disposition: A | Discharge status: Skilled Nursing Facility | Payer: Self-pay | Source: Home / Self Care | Attending: Family Medicine

## 2017-04-24 DIAGNOSIS — K274 Chronic or unspecified peptic ulcer, site unspecified, with hemorrhage: Secondary | ICD-10-CM

## 2017-04-24 DIAGNOSIS — K921 Melena: Secondary | ICD-10-CM

## 2017-04-24 DIAGNOSIS — E875 Hyperkalemia: Secondary | ICD-10-CM

## 2017-04-24 HISTORY — PX: ESOPHAGOGASTRODUODENOSCOPY: SHX5428

## 2017-04-24 HISTORY — PX: COLONOSCOPY: SHX5424

## 2017-04-24 LAB — CBC
HCT: 26.2 % — ABNORMAL LOW (ref 39.0–52.0)
HCT: 24.8 % — ABNORMAL LOW (ref 39.0–52.0)
HCT: 25 % — ABNORMAL LOW (ref 39.0–52.0)
HCT: 22.9 % — ABNORMAL LOW (ref 39.0–52.0)
HEMATOCRIT: 23.7 % — AB (ref 39.0–52.0)
HEMATOCRIT: 22.1 % — AB (ref 39.0–52.0)
HEMOGLOBIN: 8.3 g/dL — AB (ref 13.0–17.0)
HEMOGLOBIN: 7.4 g/dL — AB (ref 13.0–17.0)
HEMOGLOBIN: 8.4 g/dL — AB (ref 13.0–17.0)
Hemoglobin: 8.9 g/dL — ABNORMAL LOW (ref 13.0–17.0)
Hemoglobin: 8 g/dL — ABNORMAL LOW (ref 13.0–17.0)
Hemoglobin: 7.7 g/dL — ABNORMAL LOW (ref 13.0–17.0)
MCH: 32 pg (ref 26.0–34.0)
MCH: 32 pg (ref 26.0–34.0)
MCH: 32.1 pg (ref 26.0–34.0)
MCH: 32.2 pg (ref 26.0–34.0)
MCH: 31.7 pg (ref 26.0–34.0)
MCH: 31.7 pg (ref 26.0–34.0)
MCHC: 34 g/dL (ref 30.0–36.0)
MCHC: 33.5 g/dL (ref 30.0–36.0)
MCHC: 33.8 g/dL (ref 30.0–36.0)
MCHC: 33.5 g/dL (ref 30.0–36.0)
MCHC: 33.6 g/dL (ref 30.0–36.0)
MCHC: 33.6 g/dL (ref 30.0–36.0)
MCV: 94.2 fL (ref 78.0–100.0)
MCV: 95.8 fL (ref 78.0–100.0)
MCV: 95.2 fL (ref 78.0–100.0)
MCV: 96.1 fL (ref 78.0–100.0)
MCV: 94.3 fL (ref 78.0–100.0)
MCV: 94.2 fL (ref 78.0–100.0)
PLATELETS: 170 10*3/uL (ref 150–400)
PLATELETS: 159 10*3/uL (ref 150–400)
PLATELETS: 150 10*3/uL (ref 150–400)
Platelets: 139 10*3/uL — ABNORMAL LOW (ref 150–400)
Platelets: 138 10*3/uL — ABNORMAL LOW (ref 150–400)
Platelets: 117 10*3/uL — ABNORMAL LOW (ref 150–400)
RBC: 2.78 MIL/uL — ABNORMAL LOW (ref 4.22–5.81)
RBC: 2.59 MIL/uL — AB (ref 4.22–5.81)
RBC: 2.49 MIL/uL — AB (ref 4.22–5.81)
RBC: 2.3 MIL/uL — ABNORMAL LOW (ref 4.22–5.81)
RBC: 2.65 MIL/uL — AB (ref 4.22–5.81)
RBC: 2.43 MIL/uL — ABNORMAL LOW (ref 4.22–5.81)
RDW: 16.6 % — AB (ref 11.5–15.5)
RDW: 16.8 % — ABNORMAL HIGH (ref 11.5–15.5)
RDW: 16.6 % — AB (ref 11.5–15.5)
RDW: 16.6 % — AB (ref 11.5–15.5)
RDW: 16.6 % — ABNORMAL HIGH (ref 11.5–15.5)
RDW: 16.8 % — AB (ref 11.5–15.5)
WBC: 19.8 10*3/uL — AB (ref 4.0–10.5)
WBC: 17.2 10*3/uL — AB (ref 4.0–10.5)
WBC: 16.6 10*3/uL — ABNORMAL HIGH (ref 4.0–10.5)
WBC: 15 10*3/uL — ABNORMAL HIGH (ref 4.0–10.5)
WBC: 18.7 10*3/uL — ABNORMAL HIGH (ref 4.0–10.5)
WBC: 15.6 10*3/uL — AB (ref 4.0–10.5)

## 2017-04-24 LAB — BASIC METABOLIC PANEL
ANION GAP: 8 (ref 5–15)
BUN: 64 mg/dL — ABNORMAL HIGH (ref 6–20)
CALCIUM: 8 mg/dL — AB (ref 8.9–10.3)
CO2: 23 mmol/L (ref 22–32)
CREATININE: 1.74 mg/dL — AB (ref 0.61–1.24)
Chloride: 109 mmol/L (ref 101–111)
GFR, EST AFRICAN AMERICAN: 37 mL/min — AB (ref >60)
GFR, EST NON AFRICAN AMERICAN: 32 mL/min — AB (ref >60)
Glucose, Bld: 222 mg/dL — ABNORMAL HIGH (ref 65–99)
Potassium: 5.6 mmol/L — ABNORMAL HIGH (ref 3.5–5.1)
Sodium: 140 mmol/L (ref 135–145)

## 2017-04-24 LAB — GLUCOSE, CAPILLARY
GLUCOSE-CAPILLARY: 184 mg/dL — AB (ref 65–99)
GLUCOSE-CAPILLARY: 187 mg/dL — AB (ref 65–99)
Glucose-Capillary: 221 mg/dL — ABNORMAL HIGH (ref 65–99)
Glucose-Capillary: 184 mg/dL — ABNORMAL HIGH (ref 65–99)

## 2017-04-24 LAB — POTASSIUM
POTASSIUM: 6.1 mmol/L — AB (ref 3.5–5.1)
POTASSIUM: 4.9 mmol/L (ref 3.5–5.1)
POTASSIUM: 4.9 mmol/L (ref 3.5–5.1)
Potassium: 6 mmol/L — ABNORMAL HIGH (ref 3.5–5.1)
Potassium: 4.9 mmol/L (ref 3.5–5.1)

## 2017-04-24 SURGERY — EGD (ESOPHAGOGASTRODUODENOSCOPY)
Anesthesia: Moderate Sedation

## 2017-04-24 MED ORDER — LIDOCAINE VISCOUS 2 % MT SOLN
OROMUCOSAL | Status: DC | PRN
  Administered 2017-04-24: 1 via OROMUCOSAL

## 2017-04-24 MED ORDER — STERILE WATER FOR IRRIGATION IR SOLN
Status: DC | PRN
  Administered 2017-04-24: 2.5 mL

## 2017-04-24 MED ORDER — ONDANSETRON HCL 4 MG/2ML IJ SOLN
INTRAMUSCULAR | Status: AC
  Filled 2017-04-24: qty 2

## 2017-04-24 MED ORDER — MEPERIDINE HCL 100 MG/ML IJ SOLN
INTRAMUSCULAR | Status: DC | PRN
  Administered 2017-04-24 (×2): 25 mg via INTRAVENOUS

## 2017-04-24 MED ORDER — SODIUM CHLORIDE 0.9 % IV SOLN
Freq: Once | INTRAVENOUS | Status: AC
  Administered 2017-04-24: 13:00:00 via INTRAVENOUS

## 2017-04-24 MED ORDER — LIDOCAINE VISCOUS 2 % MT SOLN
OROMUCOSAL | Status: AC
  Filled 2017-04-24: qty 15

## 2017-04-24 MED ORDER — MIDAZOLAM HCL 5 MG/5ML IJ SOLN
INTRAMUSCULAR | Status: AC
  Filled 2017-04-24: qty 10

## 2017-04-24 MED ORDER — MEPERIDINE HCL 100 MG/ML IJ SOLN
INTRAMUSCULAR | Status: AC
  Filled 2017-04-24: qty 2

## 2017-04-24 MED ORDER — MIDAZOLAM HCL 5 MG/5ML IJ SOLN
INTRAMUSCULAR | Status: DC | PRN
  Administered 2017-04-24: 0.5 mg via INTRAVENOUS
  Administered 2017-04-24: 1 mg via INTRAVENOUS
  Administered 2017-04-24 (×2): 0.5 mg via INTRAVENOUS
  Administered 2017-04-24: 1 mg via INTRAVENOUS

## 2017-04-24 MED ORDER — ONDANSETRON HCL 4 MG/2ML IJ SOLN
INTRAMUSCULAR | Status: DC | PRN
  Administered 2017-04-24: 4 mg via INTRAVENOUS

## 2017-04-24 MED ORDER — SODIUM CHLORIDE 0.9 % IV SOLN
INTRAVENOUS | Status: AC | PRN
  Administered 2017-04-24: 1000 mL via INTRAVENOUS

## 2017-04-24 NOTE — Progress Notes (Signed)
PROGRESS NOTE  Alan CollumRobert C Briggs LKG:[id number] DOB: Jan 06, 1922 DOA: 04/23/2017 PCP: Benita StabileHall, John Z, MD  Brief Narrative: 83 year old man PMH pacemaker, diabetes, pulmonary hypertension, just released from short-term rehabilitation July 4, presented with syncope, dizziness, large bloody bowel movement at home. Admitted for GI bleed.  Assessment/Plan GI bleed, suspect lower him with associated acute blood loss anemia and hemorrhagic shock, no previous history or endoscopy. -Hemoglobin improve status post 2 units PRBC 7/7, however has drifted down 2 g overnight. Several bloody bowel movements with bowel prep. Plan to continue to trend hemoglobin, transfuse further as indicated. Keep 2 units ahead at this point. -Upper endoscopy was unrevealing. Colonoscopy is in process.  Hemorrhagic shock, secondary to GI bleed. -Resolved. Normotensive status post transfusion. Continue to monitor.  Acute blood loss anemia Plan as above  Hyperkalemia, etiology unclear. Not on any medications that would cause this. Was hyperkalemic as an outpatient and on presentation despite normal renal function. Presumed somehow connected with GI bleed. -Remains asymptomatic. Multiple EKGs with no acute changes. -Continue to trend potassium, treated with Lasix and fluids at this point given mild elevation.  Acute kidney injury, secondary to hemorrhagic shock.  -IV fluids. Monitor urine output.  Diabetes mellitus, remains stable. -Continue sliding scale insulin.  PMH Pacemaker, chronic diastolic congestive heart failure, COPD, obstructive sleep apnea. Stable.   Keep in stepdown unit today. Follow-up colonoscopy results. Continue to trend potassium and hemoglobin. Remains critically ill.  DVT prophylaxis: SCDs Code Status: full  Family Communication: s Disposition Plan: pending    Alan Sacksaniel Crescencio Jozwiak, MD  [facility] Direct contact: [phone] --Via amion app OR  --[email or web address]; password [email or web address]  7PM-7AM contact  night coverage as above 04/24/2017, 9:47 AM  LOS: 1 day   Consultants:  Gastroenterology  Procedures:  7/7 Transfusion 2 units packed red blood cells  7/7 femoral line insertion  EGD Impression:               - Esophagitis. Query pill induced - status post                            biopsy.                           - Normal stomach.                           - Multiple duodenal ulcers with no stigmata of                            bleeding.  Antimicrobials:    Interval history/Subjective: Episode of vomiting overnight. Several bowel movements with blood while doing bowel prep. Patient has no pain. Breathing fine. No complaints at this point. Going for endoscopy.  Objective: Vitals: Afebrile, 98.1, 20, 65, 126/43, 100% on nasal cannula 2 L  Exam:     Constitutional: Appears calm, comfortable.  Eyes: Pupils, irises, lids appear unremarkable  ENT: Grossly normal hearing. Lips appear unremarkable.  Cardiovascular: Regular rate and rhythm. No murmur, rub or gallop. No significant lower extremity edema. Telemetry paced rhythm.  Respiratory: Clear to auscultation bilaterally. No wheezes, rales or rhonchi. Normal respiratory effort.  Abdomen: Soft, nontender, nondistended.  Psychiatric: Grossly normal mood and affect. Speech fluent and appropriate.   I have personally reviewed the following:  Urine output 400 Labs:  Capillary blood sugars remain  stable.  Potassium improved, 5.6. BUN 64, creatinine 1.74, both increased.  Hemoglobin slowly trending down, last check 8.0. WBC 16.6.  Imaging studies:    Medical tests:  EKG paced rhythm. No acute changes compared to previous study same day. Independently reviewed.   Test discussed with performing physician:    Decision to obtain old records:    Review and summation of old records:    Scheduled Meds: . lidocaine      . [MAR Hold] albuterol  2.5 mg Nebulization Once  . [MAR Hold] Chlorhexidine Gluconate  Cloth  6 each Topical Q0600  . meperidine      . midazolam      . [MAR Hold] mupirocin ointment  1 application Nasal BID  . ondansetron      . [MAR Hold] pantoprazole (PROTONIX) IV  40 mg Intravenous Q12H  . [MAR Hold] sodium chloride flush  10-40 mL Intracatheter Q12H   Continuous Infusions: . [MAR Hold] sodium chloride    . sodium chloride 150 mL/hr at 04/24/17 0502  . [MAR Hold] sodium chloride      Principal Problem:   GIB (gastrointestinal bleeding) Active Problems:   Pacemaker   Chronic diastolic heart failure (HCC)   Chronic obstructive pulmonary disease (HCC)   OSA (obstructive sleep apnea)   Controlled diabetes mellitus type 2 with complications (HCC)   Hyperkalemia   Hemorrhagic shock (HCC)   Acute blood loss anemia   LOS: 1 day    Critical care time: 65 minutes. Includes discussions with gastric neurology, general surgery for line placement, stabilization of the patient with blood products and bedside evaluation.

## 2017-04-24 NOTE — Progress Notes (Signed)
Pt refused to drink anymore of golytely. Pt has had several more bloody BMs overnight. The last BM was very large. Bloody with clots, dark red and bright red blood noted. Pt explained why it was important to drink the golytely and why it was necessary for the planned colonoscopy. Patient went to sleep.

## 2017-04-24 NOTE — Op Note (Signed)
[facility] Patient Name: Alan PoissonRobert Briggs Procedure Date: 04/24/2017 8:08 AM MRN: [id number] Date of Birth: 01/01/1922 Attending MD: Alan Briggs , MD CSN: [id number] Age: 1991 Admit Type: Inpatient Procedure:                Upper GI endoscopy with biopsy Indications:              Hematochezia Providers:                Alan Pacobert Michael Alan Saintil, MD, Jannett CelestineAnitra Bell, RN, Toniann FailBonnie                            Pritchett RN, RN Referring MD:              Medicines:                Midazolam 1.5 mg IV, Meperidine 25 mg IV Complications:            No immediate complications. Estimated Blood Loss:     Estimated blood loss was minimal. Procedure:                Pre-Anesthesia Assessment:                           - Prior to the procedure, a History and Physical                            was performed, and patient medications and                            allergies were reviewed. The patient's tolerance of                            previous anesthesia was also reviewed. The risks                            and benefits of the procedure and the sedation                            options and risks were discussed with the patient.                            All questions were answered, and informed consent                            was obtained. Prior Anticoagulants: The patient has                            taken no previous anticoagulant or antiplatelet                            agents. ASA Grade Assessment: III - A patient with                            severe systemic disease. After reviewing the risks  and benefits, the patient was deemed in                            satisfactory condition to undergo the procedure.                           After obtaining informed consent, the endoscope was                            passed under direct vision. Throughout the                            procedure, the patient's blood pressure, pulse, and    oxygen saturations were monitored continuously. The                            [id number] ([id number]) scope was introduced through the                            mouth, and advanced to the third part of duodenum.                            The upper GI endoscopy was accomplished without                            difficulty. The patient tolerated the procedure                            well. Scope In: 9:06:26 AM Scope Out: 9:17:50 AM Total Procedure Duration: 0 hours 11 minutes 24 seconds  Findings:      No blood in the upper GI tract.      Esophagitis - marked inflammation distal one third with no bleeding was       found. Some denuded mucosa?"suspicious for pill-induced injury.      The entire examined stomach was normal.      Few superficial duodenal ulcers with no stigmata of bleeding were found       in the second portion of the duodenum. Abnormal esophageal mucosa was       biopsied with a cold forceps for histology. Estimated blood loss was       minimal Impression:               - Esophagitis. Query pill induced - status post                            biopsy.                           - Normal stomach.                           - Multiple duodenal ulcers with no stigmata of                            bleeding. Moderate Sedation:      Moderate (conscious) sedation was administered by the endoscopy nurse  and supervised by the endoscopist. The following parameters were       monitored: oxygen saturation, heart rate, blood pressure, respiratory       rate, EKG, adequacy of pulmonary ventilation, and response to care.       Total physician intraservice time was 13 minutes. Recommendation:           - Return patient to hospital ward for ongoing care.                           - Clear liquid diet.                           - Continue present medications including twice a                            day PPI therapy. Check H. pylori serologies..                           - No  repeat upper endoscopy. See colonoscopy report.                           - Procedure Code(s):        --- Professional ---                           [phone], Esophagogastroduodenoscopy, flexible,                            transoral; with biopsy, single or multiple                           99152, Moderate sedation services provided by the                            same physician or other qualified health care                            professional performing the diagnostic or                            therapeutic service that the sedation supports,                            requiring the presence of an independent trained                            observer to assist in the monitoring of the                            patient's level of consciousness and physiological                            status; initial 15 minutes of intraservice time,  patient age 22 years or older Diagnosis Code(s):        --- Professional ---                           K20.9, Esophagitis, unspecified                           K26.9, Duodenal ulcer, unspecified as acute or                            chronic, without hemorrhage or perforation                           K92.1, Melena (includes Hematochezia) CPT copyright 2016 American [employer] Association. All rights reserved. The codes documented in this report are preliminary and upon coder review may  be revised to meet current compliance requirements. Alan Briggs. Alan Gardenhire, MD Alan Pac, MD 04/24/2017 9:27:54 AM This report has been signed electronically. Number of Addenda: 0

## 2017-04-24 NOTE — Op Note (Signed)
[facility] Patient Name: Alan Briggs Procedure Date: 04/24/2017 9:21 AM MRN: [id number] Date of Birth: January 29, 1922 Attending MD: Gennette Pac , MD CSN: [id number] Age: 83 Admit Type: Inpatient Procedure:                Colonoscopy Indications:              Hematochezia Providers:                Gennette Pac, MD, Jannett Celestine, RN, Toniann Fail RN, RN Referring MD:              Medicines:                Midazolam 2 mg IV, Meperidine 25 mg IV, Ondansetron                            4 mg IV Complications:            No immediate complications. Estimated Blood Loss:     Estimated blood loss: none. Procedure:                Pre-Anesthesia Assessment:                           - Prior to the procedure, a History and Physical                            was performed, and patient medications and                            allergies were reviewed. The patient's tolerance of                            previous anesthesia was also reviewed. The risks                            and benefits of the procedure and the sedation                            options and risks were discussed with the patient.                            All questions were answered, and informed consent                            was obtained. Prior Anticoagulants: The patient has                            taken no previous anticoagulant or antiplatelet                            agents. ASA Grade Assessment: III - A patient with  severe systemic disease. After reviewing the risks                            and benefits, the patient was deemed in                            satisfactory condition to undergo the procedure.                           After obtaining informed consent, the colonoscope                            was passed under direct vision. Throughout the                            procedure, the patient's blood pressure, pulse, and                            oxygen saturations were monitored continuously. The                            [id number] ([id number]) scope was introduced through                            the anus and advanced to the the cecum, identified                            by appendiceal orifice and ileocecal valve. The                            colonoscopy was performed with moderate difficulty                            due to significant looping. The patient tolerated                            the procedure well. The quality of the bowel                            preparation was adequate. The ileocecal valve,                            appendiceal orifice, and rectum were photographed. Scope In: 9:54:15 AM Scope Out: 11:17:17 AM Scope Withdrawal Time: 0 hours 14 minutes 38 seconds  Total Procedure Duration: 1 hour 23 minutes 2 seconds  Findings:      Blood tinged fluid and some clot present from rectum all the way to       cecum. Marked redundancy and tortuosity of the colon. It took       significant effort utilizing changing of the patient's position and       external abdominal pressure to reach the cecum. Copious / meticulous       washing / lavage undertaken to thoroughly examine the colonic mucosa.       Blood and clot cleared with these  maneuvers. No abnormalities were seen       aside from a 1 cm yellowish submucosal rectosigmoid lesion consistent       with a lipoma (positive pillow sign). Mucosal surfaces appeared normal       otherwise. I did not identify a single diverticulum, AVM, neoplasm or       other abnormality. I attempted to intubate the terminal ileum but was       unable to do so. However, I saw frothy clear yellow bilious fluid flow       through the opening of the ileocecal valve on multiple occasions.      The exam was otherwise without abnormality on direct and retroflexion       views. Impression:               - Blood-tinged fluid and clot throughout colon -                             cleared with lavage. No active bleeding or bleeding                            lesion identified. Colonic lipoma?"incidental finding                           The examination was otherwise normal on direct and                            retroflexion views.                           - No specimens collected. See EGD. Peptic ulcer                            disease and esophagitis?"incidental finding in this                            setting. I suspect an occult Dieulafoy or AVM as                            the cause of his recent bleeding. Bleeding seems to                            have ceased for now. Moderate Sedation:      Moderate (conscious) sedation was administered by the endoscopy nurse       and supervised by the endoscopist. The following parameters were       monitored: oxygen saturation, heart rate, blood pressure, respiratory       rate, EKG, adequacy of pulmonary ventilation, and response to care.       Total physician intraservice time was 15 minutes. Recommendation:           - Patient has a contact number available for                            emergencies. The signs and symptoms of potential  delayed complications were discussed with the                            patient. Return to normal activities tomorrow.                            Written discharge instructions were provided to the                            patient.                           - Clear liquid diet. Continue PPI. Check H. pylori                            serologies. See EGD report. If patient rebleeds                            would pursue a stat nuclear tagged RBC study. Avoid                            all NSAIDs including low-dose aspirin going                            forward. H&H at 1500 today.                           - Continue present medications.                           - No repeat colonoscopy due to age.                           - Return  patient to hospital ward for observation.                           - Clear liquid diet. Procedure Code(s):        --- Professional ---                           [phone], Colonoscopy, flexible; diagnostic, including                            collection of specimen(s) by brushing or washing,                            when performed (separate procedure)                           99152, Moderate sedation services provided by the                            same physician or other qualified health care  professional performing the diagnostic or                            therapeutic service that the sedation supports,                            requiring the presence of an independent trained                            observer to assist in the monitoring of the                            patient's level of consciousness and physiological                            status; initial 15 minutes of intraservice time,                            patient age 41 years or older Diagnosis Code(s):        --- Professional ---                           K92.1, Melena (includes Hematochezia) CPT copyright 2016 American Medical Association. All rights reserved. The codes documented in this report are preliminary and upon coder review may  be revised to meet current compliance requirements. Gerrit Friends. Chevonne Bostrom, MD Gennette Pac, MD 04/24/2017 11:44:46 AM This report has been signed electronically. Number of Addenda: 0

## 2017-04-24 NOTE — Progress Notes (Signed)
Spoke with nursing staff around 0630: several episodes of bleeding overnight, no stool in output. Drank about 4/5ths of Golytely. Potassium this morning improved to 5.6. Most recent BP 136/57, pulse 66, afebrile. Hgb drifted down again to 8 over past 24 hours. Yesterday, Hgb responded to 2 units PRBCs yesterday with Hgb to 10. Spoke with blood bank and 2 units PRBCs on hold for patient. Discussed with patient this morning. He is without abdominal pain, nausea, or vomiting. Episode of isolated vomiting noted yesterday by nursing staff that occurred while drinking golytely. 2 tap water enemas to be given now.   Will proceed with EGD today, followed by colonoscopy. Appropriate for procedure in endoscopy today at 9am. Patient is aware of risks and benefits and willing to proceed. No family at bedside at time of discussion this morning.

## 2017-04-25 ENCOUNTER — Encounter (HOSPITAL_COMMUNITY): Payer: Self-pay | Admitting: Internal Medicine

## 2017-04-25 DIAGNOSIS — N179 Acute kidney failure, unspecified: Secondary | ICD-10-CM

## 2017-04-25 DIAGNOSIS — K274 Chronic or unspecified peptic ulcer, site unspecified, with hemorrhage: Secondary | ICD-10-CM

## 2017-04-25 DIAGNOSIS — E119 Type 2 diabetes mellitus without complications: Secondary | ICD-10-CM

## 2017-04-25 LAB — POTASSIUM: Potassium: 4.5 mmol/L (ref 3.5–5.1)

## 2017-04-25 LAB — GLUCOSE, CAPILLARY
GLUCOSE-CAPILLARY: 174 mg/dL — AB (ref 65–99)
GLUCOSE-CAPILLARY: 139 mg/dL — AB (ref 65–99)
GLUCOSE-CAPILLARY: 156 mg/dL — AB (ref 65–99)
GLUCOSE-CAPILLARY: 152 mg/dL — AB (ref 65–99)
GLUCOSE-CAPILLARY: 169 mg/dL — AB (ref 65–99)
Glucose-Capillary: 163 mg/dL — ABNORMAL HIGH (ref 65–99)
Glucose-Capillary: 177 mg/dL — ABNORMAL HIGH (ref 65–99)

## 2017-04-25 LAB — CBC
HCT: 22 % — ABNORMAL LOW (ref 39.0–52.0)
HEMATOCRIT: 22.3 % — AB (ref 39.0–52.0)
HEMOGLOBIN: 7.1 g/dL — AB (ref 13.0–17.0)
Hemoglobin: 7.4 g/dL — ABNORMAL LOW (ref 13.0–17.0)
MCH: 31.9 pg (ref 26.0–34.0)
MCH: 31.6 pg (ref 26.0–34.0)
MCHC: 33.6 g/dL (ref 30.0–36.0)
MCHC: 31.8 g/dL (ref 30.0–36.0)
MCV: 94.8 fL (ref 78.0–100.0)
MCV: 99.1 fL (ref 78.0–100.0)
PLATELETS: 114 10*3/uL — AB (ref 150–400)
Platelets: 124 10*3/uL — ABNORMAL LOW (ref 150–400)
RBC: 2.32 MIL/uL — ABNORMAL LOW (ref 4.22–5.81)
RBC: 2.25 MIL/uL — ABNORMAL LOW (ref 4.22–5.81)
RDW: 17 % — AB (ref 11.5–15.5)
RDW: 17.1 % — ABNORMAL HIGH (ref 11.5–15.5)
WBC: 13.1 10*3/uL — ABNORMAL HIGH (ref 4.0–10.5)
WBC: 11.8 10*3/uL — ABNORMAL HIGH (ref 4.0–10.5)

## 2017-04-25 LAB — BASIC METABOLIC PANEL
ANION GAP: 6 (ref 5–15)
BUN: 47 mg/dL — ABNORMAL HIGH (ref 6–20)
CHLORIDE: 107 mmol/L (ref 101–111)
CO2: 25 mmol/L (ref 22–32)
Calcium: 7.9 mg/dL — ABNORMAL LOW (ref 8.9–10.3)
Creatinine, Ser: 1.15 mg/dL (ref 0.61–1.24)
GFR calc Af Amer: >60 mL/min (ref >60)
GFR calc non Af Amer: 53 mL/min — ABNORMAL LOW (ref >60)
GLUCOSE: 165 mg/dL — AB (ref 65–99)
POTASSIUM: 4.5 mmol/L (ref 3.5–5.1)
Sodium: 138 mmol/L (ref 135–145)

## 2017-04-25 MED ORDER — SODIUM CHLORIDE 0.9 % IV SOLN
Freq: Once | INTRAVENOUS | Status: AC
  Administered 2017-04-25: 09:00:00 via INTRAVENOUS

## 2017-04-25 MED ORDER — INSULIN GLARGINE 100 UNIT/ML ~~LOC~~ SOLN
10.0000 [IU] | Freq: Every day | SUBCUTANEOUS | Status: DC
  Administered 2017-04-25: 10 [IU] via SUBCUTANEOUS
  Filled 2017-04-25 (×2): qty 0.1

## 2017-04-25 MED ORDER — TRAMADOL HCL 50 MG PO TABS
50.0000 mg | ORAL_TABLET | Freq: Two times a day (BID) | ORAL | Status: DC | PRN
  Administered 2017-04-25: 50 mg via ORAL
  Filled 2017-04-25: qty 1

## 2017-04-25 MED ORDER — PANTOPRAZOLE SODIUM 40 MG PO TBEC
40.0000 mg | DELAYED_RELEASE_TABLET | Freq: Two times a day (BID) | ORAL | Status: DC
  Administered 2017-04-25 – 2017-04-26 (×2): 40 mg via ORAL
  Filled 2017-04-25 (×2): qty 1

## 2017-04-25 MED ORDER — ACETAMINOPHEN 325 MG PO TABS
650.0000 mg | ORAL_TABLET | Freq: Four times a day (QID) | ORAL | Status: DC | PRN
Start: 2017-04-25 — End: 2017-04-26

## 2017-04-25 MED ORDER — FUROSEMIDE 40 MG PO TABS
40.0000 mg | ORAL_TABLET | Freq: Every day | ORAL | Status: DC
  Administered 2017-04-25 – 2017-04-26 (×2): 40 mg via ORAL
  Filled 2017-04-25 (×2): qty 1

## 2017-04-25 NOTE — Progress Notes (Signed)
Pt c/o pain in his L shoulder (broken from fall 03/05/17). Dr. Craige CottaKirby paged to ask for pain medication. Waiting for orders.

## 2017-04-25 NOTE — Progress Notes (Signed)
Subjective: No abdomina pain. No further overt GI bleeding per nursing staff. Desires to eat something other than clear liquids.   Objective: Vital signs in last 24 hours: Temp:  [97.5 F (36.4 C)-98.5 F (36.9 C)] 98 F (36.7 C) (07/09 0400) Pulse Rate:  [46-71] 60 (07/09 0600) Resp:  [6-22] 15 (07/09 0700) BP: (77-170)/(35-153) 128/47 (07/09 0700) SpO2:  [68 %-100 %] 98 % (07/09 0700) Weight:  [267 lb 10.2 oz (121.4 kg)] 267 lb 10.2 oz (121.4 kg) (07/09 0500) Last BM Date: 04/24/17 General:   Alert and oriented, pleasant Head:  Normocephalic and atraumatic. Eyes:  No icterus, sclera clear. Conjuctiva pink.  Abdomen:  Bowel sounds present, soft, non-tender, non-distended.  Extremities:  With improving pedal edema  Neurologic:  Alert and  oriented x4 Psych:  Alert and cooperative. Normal mood and affect.  Intake/Output from previous day: 07/08 0701 - 07/09 0700 In: 1972.5 [I.V.:1650; Blood:322.5] Out: 1125 [Urine:1125] Intake/Output this shift: No intake/output data recorded.  Lab Results:  Recent Labs  04/24/17 2027 04/24/17 2300 04/25/17 0442  WBC 15.6* 13.1* 11.8*  HGB 7.7* 7.4* 7.1*  HCT 22.9* 22.0* 22.3*  PLT 117* 114* 124*   BMET  Recent Labs  04/23/17 0603  04/24/17 0645  04/24/17 2027 04/24/17 2300 04/25/17 0442  NA 139  --  140  --   --   --  138  K 6.5*  < > 5.6*  < > 4.9 4.5 4.5  CL 107  --  109  --   --   --  107  CO2 25  --  23  --   --   --  25  GLUCOSE 202*  --  222*  --   --   --  165*  BUN 47*  --  64*  --   --   --  47*  CREATININE 1.23  --  1.74*  --   --   --  1.15  CALCIUM 8.1*  --  8.0*  --   --   --  7.9*  < > = values in this interval not displayed. LFT  Recent Labs  04/23/17 0101  PROT 5.5*  ALBUMIN 2.7*  AST 34  ALT 26  ALKPHOS 78  BILITOT 1.1   PT/INR  Recent Labs  04/23/17 0101  LABPROT 16.5*  INR 1.32     Studies/Results: Dg Abd Portable 1v  Result Date: 04/23/2017 CLINICAL DATA:  GI bleed, recent  falls. EXAM: PORTABLE ABDOMEN - 1 VIEW COMPARISON:  Abdominal radiograph April 28, 2016 FINDINGS: Gas distended stomach, bowel gas pattern is nonobstructive. No intra-abdominal mass effect. Phleboliths project in the pelvis. Moderate vascular calcifications. No intra-abdominal mass effect. Soft tissue planes and included osseous structures are nonsuspicious. IMPRESSION: Mild gas distended stomach, nonobstructive bowel gas pattern. Electronically Signed   By: Awilda Metroourtnay  Bloomer M.D.   On: 04/23/2017 18:47    Assessment: 83 year old male admitted with hemodynamically significant lower GI bleed s/p EGD and colonoscopy as noted on 04/24/17. Incidental findings of esophagitis and multiple duodenal ulcers that did not contribute to clinical presentation, and colonoscopy without active bleeding or culprit bleeding lesion. GI bleed likely secondary to occult Dieulafoy or AVM.   Hgb trending down and likely multifactorial. Clinically improved without overt GI bleeding. If evidence of recurrent bleeding, will need stat tagged RBC scan. Agree with additional unit of PRBCs.   Plan: Advance diet to full liquids H.pylori serology pending PPI BID  If recurrent bleeding, needs stat tagged  RBC scan Avoid NSAIDs and aspirin Agree with additional unit PRBCs Will continue to follow with you: hopeful discharge in next 48 hours   Alan Mink, PhD, ANP-BC [facility]    LOS: 2 days    04/25/2017, 7:57 AM

## 2017-04-25 NOTE — Progress Notes (Signed)
PT Cancellation Note  Patient Details Name: Alan CollumRobert C Briggs MRN: [id number] DOB: 1922/09/26   Cancelled Treatment:    Reason Eval/Treat Not Completed: Medical issues which prohibited therapy.  Pt is still waiting for transfusion and will try later as time and pt allow.   Ivar DrapeRuth E Jolissa Kapral 04/25/2017, 2:23 PM   2:23 PM, 04/25/17 Samul Dadauth Kimarie Coor, PT, MS Physical Therapist - [place] [phone](ASCOM)  [phone] (Office)

## 2017-04-25 NOTE — Progress Notes (Signed)
PT Cancellation Note  Patient Details Name: Cora CollumRobert C Foulkes MRN: [id number] DOB: 03/15/1922   Cancelled Treatment:    Reason Eval/Treat Not Completed: Medical issues which prohibited therapy.  Will see later due to pt having excessively low Hgb and will see how he progresses.    Ivar DrapeRuth E Rourke Mcquitty 04/25/2017, 10:49 AM   10:50 AM, 04/25/17 Samul Dadauth Casimira Sutphin, PT, MS Physical Therapist - [place] [phone](ASCOM)  [phone] (Office)

## 2017-04-25 NOTE — Care Management Note (Addendum)
Case Management Note  Patient Details  Name: Alan CollumRobert C Briggs MRN: [id number] Date of Birth: 05/26/1922  Subjective/Objective:  Adm with GIB. Chart reviewed. Recent rehab stay. From home with family with Home health, [facility]. PT consult pending.                Action/Plan: CM following for needs. ? Return home with home health.    Expected Discharge Date:     04/26/2017             Expected Discharge Plan:     In-House Referral:     Discharge planning Services  CM Consult  Post Acute Care Choice:    Choice offered to:     DME Arranged:    DME Agency:     HH Arranged:    HH Agency:     Status of Service:  In process, will continue to follow  If discussed at Long Length of Stay Meetings, dates discussed:    Additional Comments:  Duel Conrad, Chrystine OilerSharley Diane, RN 04/25/2017, 1:28 PM

## 2017-04-25 NOTE — Progress Notes (Signed)
PROGRESS NOTE  Alan Briggs ZOX:[id number] DOB: [birth year] DOA: 04/23/2017 PCP: Benita Stabile, MD  Brief Narrative: 83 year old man PMH pacemaker, diabetes, pulmonary hypertension, just released from short-term rehabilitation July 4, presented with syncope, dizziness, large bloody bowel movement at home. Admitted for GI bleed.  Assessment/Plan GI bleed, with hemorrhagic shock and associated acute blood loss anemia; etiology not clear, incidental findings on upper and lower endoscopies per GI. Occult Dieulafoy or AVM suspected. -No bleeding overnight. Hgb trending down, likely reflecting equilibration rather than further bleeding. -Continue supportive care. PPI.  Hemorrhagic shock secondary to GI bleed.  -Resolved. Remains normotensive.  Acute blood loss anemia. -As above, hemoglobin trending down, likely equilibration. Transfuse 1 additional unit packed red blood cells and follow with CBC in a.m.  Acute kidney injury secondary to hemorrhagic shock -Resolved.  Diabetes mellitus, remained stable -Resume Lantus. Continue sliding scale insulin.  Hyperkalemia, etiology unclear, resolved. Suspect connective a GI bleed.  PMH Pacemaker, chronic diastolic congestive heart failure, COPD, obstructive sleep apnea. Stable.   Overall much improved. Plan transfusion 1 additional unit packed red blood cells. Remove femoral line thereafter. Transfer to medical floor. Likely home next 48 hours if continues to improve.   PT consultation    DVT prophylaxis: SCDs Code Status: full  Family Communication:  Disposition Plan: pending    Brendia Sacks, MD  [facility] Direct contact: [phone] --Via amion app OR  --[email or web address]; password [email or web address]  7PM-7AM contact night coverage as above 04/25/2017, 7:56 AM  LOS: 2 days   Consultants:  Gastroenterology  Procedures:  7/7 Transfusion 2 units packed red blood cells  7/7 femoral line insertion  EGD Impression:               -  Esophagitis. Query pill induced - status post                            biopsy.                           - Normal stomach.                           - Multiple duodenal ulcers with no stigmata of                            bleeding.  Colonoscopy  Impression:               - Blood-tinged fluid and clot throughout colon -                            cleared with lavage. No active bleeding or bleeding                            lesion identified. Colonic lipoma?"incidental finding                           The examination was otherwise normal on direct and                            retroflexion views.                           -  No specimens collected. See EGD. Peptic ulcer                            disease and esophagitis?"incidental finding in this                            setting. I suspect an occult Dieulafoy or AVM as                            the cause of his recent bleeding. Bleeding seems to                            have ceased for now.  Antimicrobials:    Interval history/Subjective: Doing well, no bleeding noted. No pain. Breathing fine.  Objective: Vitals: afebrile, 98.0, 15, 60, 128.47  Exam:     Constitutional: appears calm, comfortable  Eyes: pupils, irises, lids appear unremarkable  CV: RRR no m/r/g. No LE edema. Telemetry paced rhythm  Respiratory: Clear to auscultation bilaterally. No wheezes, rales or rhonchi. Normal respiratory effort.  Abdomen: Soft, nontender, nondistended  Psychiatric: Grossly normal mood and affect. Speech fluent and appropriate.   I have personally reviewed the following:   Labs:  Basic metabolic panel shows improvement in BUN, 47. Creatinine has normalized, 1.15.  WBC trending down, looking 0.8. Hemoglobin trending down, 7.1; platelet count improving, 124.  Imaging studies:    Medical tests:  Colonoscopy noted  Test discussed with performing physician:    Decision to obtain old records:    Review and  summation of old records:    Scheduled Meds: . albuterol  2.5 mg Nebulization Once  . Chlorhexidine Gluconate Cloth  6 each Topical Q0600  . mupirocin ointment  1 application Nasal BID  . pantoprazole (PROTONIX) IV  40 mg Intravenous Q12H  . sodium chloride flush  10-40 mL Intracatheter Q12H   Continuous Infusions: . sodium chloride    . sodium chloride 150 mL/hr at 04/24/17 0502  . sodium chloride      Principal Problem:   GIB (gastrointestinal bleeding) Active Problems:   Pacemaker   Chronic diastolic heart failure (HCC)   Chronic obstructive pulmonary disease (HCC)   OSA (obstructive sleep apnea)   Controlled diabetes mellitus type 2 with complications (HCC)   AKI (acute kidney injury) (HCC)   Hyperkalemia   Hemorrhagic shock (HCC)   Acute blood loss anemia   LOS: 2 days

## 2017-04-26 DIAGNOSIS — R404 Transient alteration of awareness: Secondary | ICD-10-CM | POA: Diagnosis not present

## 2017-04-26 DIAGNOSIS — K625 Hemorrhage of anus and rectum: Secondary | ICD-10-CM | POA: Diagnosis not present

## 2017-04-26 DIAGNOSIS — I5023 Acute on chronic systolic (congestive) heart failure: Secondary | ICD-10-CM | POA: Diagnosis not present

## 2017-04-26 DIAGNOSIS — K922 Gastrointestinal hemorrhage, unspecified: Secondary | ICD-10-CM | POA: Diagnosis not present

## 2017-04-26 DIAGNOSIS — F418 Other specified anxiety disorders: Secondary | ICD-10-CM | POA: Diagnosis not present

## 2017-04-26 DIAGNOSIS — K5791 Diverticulosis of intestine, part unspecified, without perforation or abscess with bleeding: Secondary | ICD-10-CM | POA: Diagnosis not present

## 2017-04-26 DIAGNOSIS — R0600 Dyspnea, unspecified: Secondary | ICD-10-CM | POA: Diagnosis not present

## 2017-04-26 DIAGNOSIS — Z79899 Other long term (current) drug therapy: Secondary | ICD-10-CM | POA: Diagnosis not present

## 2017-04-26 DIAGNOSIS — Z87891 Personal history of nicotine dependence: Secondary | ICD-10-CM | POA: Diagnosis not present

## 2017-04-26 DIAGNOSIS — I482 Chronic atrial fibrillation: Secondary | ICD-10-CM | POA: Diagnosis not present

## 2017-04-26 DIAGNOSIS — R42 Dizziness and giddiness: Secondary | ICD-10-CM | POA: Diagnosis not present

## 2017-04-26 DIAGNOSIS — I10 Essential (primary) hypertension: Secondary | ICD-10-CM | POA: Diagnosis not present

## 2017-04-26 DIAGNOSIS — R0609 Other forms of dyspnea: Secondary | ICD-10-CM | POA: Diagnosis not present

## 2017-04-26 DIAGNOSIS — Z794 Long term (current) use of insulin: Secondary | ICD-10-CM | POA: Diagnosis not present

## 2017-04-26 DIAGNOSIS — R05 Cough: Secondary | ICD-10-CM | POA: Diagnosis not present

## 2017-04-26 DIAGNOSIS — E782 Mixed hyperlipidemia: Secondary | ICD-10-CM | POA: Diagnosis not present

## 2017-04-26 DIAGNOSIS — R269 Unspecified abnormalities of gait and mobility: Secondary | ICD-10-CM | POA: Diagnosis not present

## 2017-04-26 DIAGNOSIS — M6281 Muscle weakness (generalized): Secondary | ICD-10-CM | POA: Diagnosis not present

## 2017-04-26 DIAGNOSIS — Z658 Other specified problems related to psychosocial circumstances: Secondary | ICD-10-CM | POA: Diagnosis not present

## 2017-04-26 DIAGNOSIS — E875 Hyperkalemia: Secondary | ICD-10-CM | POA: Diagnosis not present

## 2017-04-26 DIAGNOSIS — B351 Tinea unguium: Secondary | ICD-10-CM | POA: Diagnosis not present

## 2017-04-26 DIAGNOSIS — D62 Acute posthemorrhagic anemia: Secondary | ICD-10-CM | POA: Diagnosis not present

## 2017-04-26 DIAGNOSIS — D500 Iron deficiency anemia secondary to blood loss (chronic): Secondary | ICD-10-CM | POA: Diagnosis not present

## 2017-04-26 DIAGNOSIS — M48061 Spinal stenosis, lumbar region without neurogenic claudication: Secondary | ICD-10-CM | POA: Diagnosis not present

## 2017-04-26 DIAGNOSIS — D6489 Other specified anemias: Secondary | ICD-10-CM | POA: Diagnosis not present

## 2017-04-26 DIAGNOSIS — R531 Weakness: Secondary | ICD-10-CM | POA: Diagnosis not present

## 2017-04-26 DIAGNOSIS — I5022 Chronic systolic (congestive) heart failure: Secondary | ICD-10-CM | POA: Diagnosis not present

## 2017-04-26 DIAGNOSIS — R499 Unspecified voice and resonance disorder: Secondary | ICD-10-CM | POA: Diagnosis not present

## 2017-04-26 DIAGNOSIS — R1312 Dysphagia, oropharyngeal phase: Secondary | ICD-10-CM | POA: Diagnosis not present

## 2017-04-26 DIAGNOSIS — N179 Acute kidney failure, unspecified: Secondary | ICD-10-CM | POA: Diagnosis not present

## 2017-04-26 DIAGNOSIS — E118 Type 2 diabetes mellitus with unspecified complications: Secondary | ICD-10-CM | POA: Diagnosis not present

## 2017-04-26 DIAGNOSIS — Z7401 Bed confinement status: Secondary | ICD-10-CM | POA: Diagnosis not present

## 2017-04-26 DIAGNOSIS — I5032 Chronic diastolic (congestive) heart failure: Secondary | ICD-10-CM | POA: Diagnosis not present

## 2017-04-26 DIAGNOSIS — M216X2 Other acquired deformities of left foot: Secondary | ICD-10-CM | POA: Diagnosis not present

## 2017-04-26 DIAGNOSIS — I110 Hypertensive heart disease with heart failure: Secondary | ICD-10-CM | POA: Diagnosis not present

## 2017-04-26 DIAGNOSIS — R578 Other shock: Secondary | ICD-10-CM | POA: Diagnosis not present

## 2017-04-26 DIAGNOSIS — Z950 Presence of cardiac pacemaker: Secondary | ICD-10-CM | POA: Diagnosis not present

## 2017-04-26 DIAGNOSIS — D649 Anemia, unspecified: Secondary | ICD-10-CM | POA: Diagnosis not present

## 2017-04-26 DIAGNOSIS — M79675 Pain in left toe(s): Secondary | ICD-10-CM | POA: Diagnosis not present

## 2017-04-26 DIAGNOSIS — E1129 Type 2 diabetes mellitus with other diabetic kidney complication: Secondary | ICD-10-CM | POA: Diagnosis not present

## 2017-04-26 DIAGNOSIS — I481 Persistent atrial fibrillation: Secondary | ICD-10-CM | POA: Diagnosis not present

## 2017-04-26 DIAGNOSIS — J449 Chronic obstructive pulmonary disease, unspecified: Secondary | ICD-10-CM | POA: Diagnosis not present

## 2017-04-26 DIAGNOSIS — S42202D Unspecified fracture of upper end of left humerus, subsequent encounter for fracture with routine healing: Secondary | ICD-10-CM | POA: Diagnosis not present

## 2017-04-26 DIAGNOSIS — L84 Corns and callosities: Secondary | ICD-10-CM | POA: Diagnosis not present

## 2017-04-26 DIAGNOSIS — R279 Unspecified lack of coordination: Secondary | ICD-10-CM | POA: Diagnosis not present

## 2017-04-26 LAB — GLUCOSE, CAPILLARY
GLUCOSE-CAPILLARY: 175 mg/dL — AB (ref 65–99)
Glucose-Capillary: 154 mg/dL — ABNORMAL HIGH (ref 65–99)
Glucose-Capillary: 149 mg/dL — ABNORMAL HIGH (ref 65–99)
Glucose-Capillary: 173 mg/dL — ABNORMAL HIGH (ref 65–99)

## 2017-04-26 LAB — BPAM RBC
Blood Product Expiration Date: 201807192359
Blood Product Expiration Date: 201807192359
Blood Product Expiration Date: 201807192359
Blood Product Expiration Date: 201807192359
ISSUE DATE / TIME: 201807071044
ISSUE DATE / TIME: 201807070752
ISSUE DATE / TIME: 201807081311
ISSUE DATE / TIME: 201807091338
UNIT TYPE AND RH: 6200
Unit Type and Rh: 6200
Unit Type and Rh: 6200
Unit Type and Rh: 6200

## 2017-04-26 LAB — TYPE AND SCREEN
ABO/RH(D): A POS
Antibody Screen: NEGATIVE
UNIT DIVISION: 0
UNIT DIVISION: 0
Unit division: 0
Unit division: 0

## 2017-04-26 LAB — CBC
HCT: 25.4 % — ABNORMAL LOW (ref 39.0–52.0)
HEMATOCRIT: 26.8 % — AB (ref 39.0–52.0)
HEMOGLOBIN: 8.9 g/dL — AB (ref 13.0–17.0)
Hemoglobin: 8.5 g/dL — ABNORMAL LOW (ref 13.0–17.0)
MCH: 32.2 pg (ref 26.0–34.0)
MCH: 32.1 pg (ref 26.0–34.0)
MCHC: 33.5 g/dL (ref 30.0–36.0)
MCHC: 33.2 g/dL (ref 30.0–36.0)
MCV: 96.2 fL (ref 78.0–100.0)
MCV: 96.8 fL (ref 78.0–100.0)
PLATELETS: 114 10*3/uL — AB (ref 150–400)
Platelets: 113 10*3/uL — ABNORMAL LOW (ref 150–400)
RBC: 2.64 MIL/uL — AB (ref 4.22–5.81)
RBC: 2.77 MIL/uL — ABNORMAL LOW (ref 4.22–5.81)
RDW: 17 % — ABNORMAL HIGH (ref 11.5–15.5)
RDW: 17.1 % — ABNORMAL HIGH (ref 11.5–15.5)
WBC: 9 10*3/uL (ref 4.0–10.5)
WBC: 9.2 10*3/uL (ref 4.0–10.5)

## 2017-04-26 LAB — H. PYLORI ANTIBODY, IGG

## 2017-04-26 MED ORDER — FUROSEMIDE 20 MG PO TABS: 20.0000 mg | ORAL_TABLET | Freq: Every day | ORAL | Status: DC

## 2017-04-26 MED ORDER — INSULIN GLARGINE 100 UNIT/ML ~~LOC~~ SOLN: 10.0000 [IU] | Freq: Every day | SUBCUTANEOUS | 11 refills | Status: DC

## 2017-04-26 MED ORDER — TRAMADOL HCL 50 MG PO TABS: 50.0000 mg | ORAL_TABLET | Freq: Two times a day (BID) | ORAL | 0 refills | Status: DC | PRN

## 2017-04-26 NOTE — [place] FL2 (Deleted)
[place] MEDICAID FL2 LEVEL OF CARE SCREENING TOOL     IDENTIFICATION  Patient Name: Alan Briggs Birthdate: 1922/10/09 Sex: male Admission Date (Current Location): 04/23/2017  [facility] and [place] Number:  [employer] and Address:  [facility],  [address] [place] [address]      Provider Number: [id number]  Attending Physician Name and Address:  Standley Brooking, MD  Relative Name and Phone Number:       Current Level of Care: Hospital Recommended Level of Care: Skilled Nursing Facility Prior Approval Number:    Date Approved/Denied:   PASRR Number: [id number] ([id number] )  Discharge Plan: SNF    Current Diagnoses: Patient Active Problem List   Diagnosis Date Noted  . Chronic or unspecified peptic ulcer, unspecified site, with hemorrhage, without mention of obstruction   . Acute kidney failure, unspecified (HCC)   . Type II or unspecified type diabetes mellitus without mention of complication, not stated as uncontrolled   . GIB (gastrointestinal bleeding) 04/23/2017  . Hyperpotassemia 04/23/2017  . Hemorrhagic shock (HCC) 04/23/2017  . Acute posthemorrhagic anemia 04/23/2017  . Humerus fracture 03/06/2017  . Essential hypertension 03/06/2017  . AKI (acute kidney injury) (HCC) 03/06/2017  . Pancreatic mass   . Chronic obstructive pulmonary disease (HCC)   . OSA (obstructive sleep apnea)   . Controlled diabetes mellitus type 2 with complications (HCC)   . Sick sinus syndrome (HCC)   . Presence of permanent cardiac pacemaker   . Dislocation of fifth toe, right, closed   . Abdominal pain 04/17/2016  . Pacemaker 09/30/2015  . Chronic diastolic heart failure (HCC) 09/30/2015  . Venous insufficiency 09/30/2015    Orientation RESPIRATION BLADDER Height & Weight     Self, Situation, Place  Normal Incontinent Weight: 267 lb 10.2 oz (121.4 kg) Height:  6' (182.9 cm)  BEHAVIORAL SYMPTOMS/MOOD NEUROLOGICAL BOWEL NUTRITION STATUS   Continent Diet (see discharge summary)  AMBULATORY STATUS COMMUNICATION OF NEEDS Skin   Limited Assist Verbally PU Stage and Appropriate Care (buttocks, right)   PU Stage 2 Dressing:  (Foam dressing, PRN)                   Personal Care Assistance Level of Assistance  Bathing, Feeding, Dressing Bathing Assistance: Limited assistance Feeding assistance: Independent Dressing Assistance: Limited assistance     Functional Limitations Info  Sight, Hearing, Speech Sight Info: Adequate Hearing Info: Adequate Speech Info: Adequate    SPECIAL CARE FACTORS FREQUENCY  PT (By licensed PT)     PT Frequency: 5x/week              Contractures Contractures Info: Not present    Additional Factors Info  Code Status, Allergies Code Status Info: Full Code Allergies Info: Advair Diskus, Combivent, Lotensin Hct, Penicillins, Simvastatin,            Current Medications (04/26/2017):  This is the current hospital active medication list Current Facility-Administered Medications  Medication Dose Route Frequency Provider Last Rate Last Dose  . 0.9 %  sodium chloride infusion   Intravenous Once Tarry Kos A, MD      . acetaminophen (TYLENOL) tablet 650 mg  650 mg Oral Q6H PRN Kirby-Graham, Beather Arbour, NP      . albuterol (PROVENTIL) (2.5 MG/3ML) 0.083% nebulizer solution 2.5 mg  2.5 mg Nebulization Once Standley Brooking, MD      . Chlorhexidine Gluconate Cloth 2 % PADS 6 each  6 each Topical Q0600 Standley Brooking,  MD   6 each at 04/26/17 0513  . furosemide (LASIX) tablet 40 mg  40 mg Oral Daily Standley BrookingGoodrich, Daniel P, MD   40 mg at 04/26/17 0813  . insulin glargine (LANTUS) injection 10 Units  10 Units Subcutaneous QHS Standley BrookingGoodrich, Daniel P, MD   10 Units at 04/25/17 2005  . mupirocin ointment (BACTROBAN) 2 % 1 application  1 application Nasal BID Standley BrookingGoodrich, Daniel P, MD   1 application at 04/26/17 [phone]  . ondansetron (ZOFRAN) injection 4 mg  4 mg Intravenous Q6H PRN Standley BrookingGoodrich, Daniel P, MD       . pantoprazole (PROTONIX) EC tablet 40 mg  40 mg Oral BID AC Gelene MinkBoone, Anna W, NP   40 mg at 04/26/17 0544  . sodium chloride 0.9 % bolus 1,000 mL  1,000 mL Intravenous Once Tarry Kosavid, Rachal A, MD      . sodium chloride flush (NS) 0.9 % injection 10-40 mL  10-40 mL Intracatheter Q12H Franky MachoJenkins, Mark, MD   10 mL at 04/26/17 0814  . sodium chloride flush (NS) 0.9 % injection 10-40 mL  10-40 mL Intracatheter PRN Franky MachoJenkins, Mark, MD      . traMADol Janean Sark(ULTRAM) tablet 50 mg  50 mg Oral Q12H PRN Leda GauzeKirby-Graham, Karen J, NP   50 mg at 04/25/17 2308     Discharge Medications: Please see discharge summary for a list of discharge medications.  Relevant Imaging Results:  Relevant Lab Results:   Additional Information SSN: [id number]  Marcheta Horsey, Juleen ChinaHeather D, LCSW

## 2017-04-26 NOTE — Clinical Social Work Note (Signed)
Clinical Social Work Assessment  Patient Details  Name: Alan Briggs MRN: [id number] Date of Birth: 03/27/1922  Date of referral:  04/26/17               Reason for consult:  Discharge Planning                Permission sought to share information with:    Permission granted to share information::     Name::        Agency::     Relationship::     Contact Information:     Housing/Transportation Living arrangements for the past 2 months:  Single Family Home Source of Information:  Patient Patient Interpreter Needed:  None Criminal Activity/Legal Involvement Pertinent to Current Situation/Hospitalization:  No - Comment as needed Significant Relationships:  Adult Children Lives with:  Self Do you feel safe going back to the place where you live?  No Need for family participation in patient care:  Yes (Comment)  Care giving concerns:  None identified.   Social Worker assessment / plan:  Patient lives alone. At baseline he ambulates with a walker or a cane. He as cooking for himself. He stopped driving in May. Patient is able to bathe himself or sometimes his sons assists. Patient is agreeable to SNF.   Employment status:  Retired Health and safety inspectornsurance information:  Medicare PT Recommendations:  Skilled Nursing Facility Information / Referral to community resources:  Skilled Nursing Facility  Patient/Family's Response to care:  Patient is agreeable to SNF.  Patient/Family's Understanding of and Emotional Response to Diagnosis, Current Treatment, and Prognosis:  Patient understands his diagnosis, treatment and prognosis.  Emotional Assessment Appearance:  Appears stated age Attitude/Demeanor/Rapport:    Affect (typically observed):  Accepting Orientation:  Oriented to Self, Oriented to Place, Oriented to Situation Alcohol / Substance use:  Not Applicable Psych involvement (Current and /or in the community):     Discharge Needs  Concerns to be addressed:  Discharge Planning  Concerns Readmission within the last 30 days:  No Current discharge risk:  Lives alone Barriers to Discharge:  No Barriers Identified   Annice NeedySettle, [name], LCSW 04/26/2017, 1:34 PM

## 2017-04-26 NOTE — Discharge Summary (Addendum)
Physician Discharge Summary  Alan Briggs WUJ:[id number] DOB: [birth year] DOA: 04/23/2017  PCP: Benita Stabile, MD  Admit date: 04/23/2017 Discharge date: 04/26/2017  Recommendations for Outpatient Follow-up:  1. Monitor for any further overt GI bleeding 2. Gi will follow-up on H.pylori serology as comes available 3. PPI BID 4. Avoid NSAIDs and aspirin 5. Lantus currently at 10 units until oral intake improves. Can titrate back to 30 units daily as diet improves.  Contact information for after-discharge care    Destination    [facility] Follow up.   Specialty:  Skilled Nursing Facility Contact information: [address] [place] [address] [phone]               Discharge Diagnoses:  1. GI bleed, suspected occult Dieulafoy or AVM 2. Hemorrhagic shock 3. Acute blood loss anemia 4. Acute kidney injury 5. Diabetes mellitus 6. Hyperkalemia  Discharge Condition: Improved Disposition: Short-term rehabilitation  Diet recommendation: Full liquids, advance as tolerated.  Filed Weights   04/23/17 1011 04/24/17 0500 04/25/17 0500  Weight: 115.6 kg (254 lb 13.6 oz) 119.1 kg (262 lb 9.1 oz) 121.4 kg (267 lb 10.2 oz)    History of present illness:  83 year old man PMH pacemaker, diabetes, pulmonary hypertension, just released from short-term rehabilitation July 4, presented with syncope, dizziness, large bloody bowel movement at home. Admitted for GI bleed.  Hospital Course:  Patient had several episodes of bloody bowel movements early in hospitalization, developed hemorrhagic shock, acute blood loss anemia and was transferred to stepdown unit, stabilized with volume and PRBC transfusion. Bleeding spontaneously stopped. Seen by gastroenterology, underwent upper and lower endoscopy which were unrevealing, per GI Occult Dieulafoy or AVM suspected. No recurrent bleeding in hemoglobin at this point has stabilized. Blood pressure has remained stable post.  Other issues included acute kidney injury and hyperkalemia which resolved with appropriate treatment. Comorbidities including chronic diastolic congestive heart failure, COPD remained stable.  Consultants:  Gastroenterology  Procedures:  7/7 Transfusion 2 units packed red blood cells  7/7 femoral line insertion, removed 7/10  EGD Impression: - Esophagitis. Query pill induced - status post  biopsy. - Normal stomach. - Multiple duodenal ulcers with no stigmata of  bleeding.  Colonoscopy  Impression: - Blood-tinged fluid and clot throughout colon -  cleared with lavage. No active bleeding or bleeding  lesion identified. Colonic lipoma?"incidental finding The examination was otherwise normal on direct and  retroflexion views. - No specimens collected. See EGD. Peptic ulcer  disease and esophagitis?"incidental finding in this  setting. I suspect an occult Dieulafoy or AVM as  the cause of his recent bleeding. Bleeding seems to  have ceased for now.   Today's assessment: S: Feels overall poorly, has some difficulty swallowing earlier which she has intermittently. Breathing okay. No new issues. No bleeding noted. O: Vitals: 97.6, 18, 61, 144/61, 97% on room air   Constitutional. Appears calm, comfortable.  Cardiovascular. Regular rate and rhythm. No murmur, rub or gallop. 2+ bilateral lower extremity edema.  Respiratory. Clear to auscultation bilaterally. No wheezes, rales or rhonchi. Normal respiratory effort.  Psychiatric. Grossly normal mood and affect. Speech fluent and appropriate.  Discharge  Instructions   Allergies as of 04/26/2017      Reactions   Advair Diskus [fluticasone-salmeterol] Other (See Comments)   Reaction:  Unknown    Combivent [ipratropium-albuterol] Other (See Comments)   Reaction:  Unknown    Lotensin Hct [benazepril-hydrochlorothiazide] Other (See Comments)   Reaction:  Unknown  Penicillins Swelling, Other (See Comments)   Reaction:  Facial swelling Has patient had a PCN reaction causing immediate rash, facial/tongue/throat swelling, SOB or lightheadedness with hypotension: Yes Has patient had a PCN reaction causing severe rash involving mucus membranes or skin necrosis: No Has patient had a PCN reaction that required hospitalization No Has patient had a PCN reaction occurring within the last 10 years: No If all of the above answers are "NO", then may proceed with Cephalosporin use.   Simvastatin Other (See Comments)   Reaction:  Muscle pain       Medication List    STOP taking these medications   aspirin EC 81 MG tablet   insulin lispro 100 UNIT/ML KiwkPen Generic drug:  insulin lispro   oxyCODONE 5 MG immediate release tablet Commonly known as:  Oxy IR/ROXICODONE     TAKE these medications   acetaminophen 500 MG tablet Commonly known as:  TYLENOL Take 1,000 mg by mouth every 6 (six) hours as needed for mild pain, moderate pain or headache.   atenolol 50 MG tablet Commonly known as:  TENORMIN Take 25 mg by mouth daily.   Fish Oil 1200 MG Caps Take 1 capsule by mouth daily.   furosemide 20 MG tablet Commonly known as:  LASIX Take 1 tablet (20 mg total) by mouth daily.   ICAPS AREDS 2 Caps Take 1 capsule by mouth daily.   insulin glargine 100 UNIT/ML injection Commonly known as:  LANTUS Inject 0.1 mLs (10 Units total) into the skin at bedtime. What changed:  how much to take   multivitamin with minerals Tabs tablet Take 1 tablet by mouth daily.   polyethylene glycol packet Commonly known as:  MIRALAX / GLYCOLAX Take 17 g by  mouth daily as needed for mild constipation.   traMADol 50 MG tablet Commonly known as:  ULTRAM Take 1 tablet (50 mg total) by mouth every 12 (twelve) hours as needed for moderate pain (or Headache unrelieved by tylenol).            Durable Medical Equipment        Start     Ordered   04/26/17 1133  For home use only DME Walker hemi  Once    Question:  Patient needs a walker to treat with the following condition  Answer:  Weakness   04/26/17 1133     Allergies  Allergen Reactions  . Advair Diskus [Fluticasone-Salmeterol] Other (See Comments)    Reaction:  Unknown   . Combivent [Ipratropium-Albuterol] Other (See Comments)    Reaction:  Unknown   . Lotensin Hct [Benazepril-Hydrochlorothiazide] Other (See Comments)    Reaction:  Unknown   . Penicillins Swelling and Other (See Comments)    Reaction:  Facial swelling Has patient had a PCN reaction causing immediate rash, facial/tongue/throat swelling, SOB or lightheadedness with hypotension: Yes Has patient had a PCN reaction causing severe rash involving mucus membranes or skin necrosis: No Has patient had a PCN reaction that required hospitalization No Has patient had a PCN reaction occurring within the last 10 years: No If all of the above answers are "NO", then may proceed with Cephalosporin use.  . Simvastatin Other (See Comments)    Reaction:  Muscle pain     The results of significant diagnostics from this hospitalization (including imaging, microbiology, ancillary and laboratory) are listed below for reference.    Significant Diagnostic Studies: Dg Abd Portable 1v  Result Date: 04/23/2017 CLINICAL DATA:  GI bleed, recent falls. EXAM: PORTABLE ABDOMEN -  1 VIEW COMPARISON:  Abdominal radiograph April 28, 2016 FINDINGS: Gas distended stomach, bowel gas pattern is nonobstructive. No intra-abdominal mass effect. Phleboliths project in the pelvis. Moderate vascular calcifications. No intra-abdominal mass effect. Soft tissue  planes and included osseous structures are nonsuspicious. IMPRESSION: Mild gas distended stomach, nonobstructive bowel gas pattern. Electronically Signed   By: Awilda Metroourtnay  Bloomer M.D.   On: 04/23/2017 18:47    Microbiology: Recent Results (from the past 240 hour(s))  MRSA PCR Screening     Status: Abnormal   Collection Time: 04/23/17 10:15 AM  Result Value Ref Range Status   MRSA by PCR POSITIVE (A) NEGATIVE Final    Comment:        The GeneXpert MRSA Assay (FDA approved for NASAL specimens only), is one component of a comprehensive MRSA colonization surveillance program. It is not intended to diagnose MRSA infection nor to guide or monitor treatment for MRSA infections. RESULT CALLED TO, READ BACK BY AND VERIFIED WITH: DAVIS,K AT 1240 ON 04/23/2017 BY MOSLEY,J      Labs: Basic Metabolic Panel:  Recent Labs Lab 04/23/17 0101 04/23/17 0603  04/24/17 0645 04/24/17 1215 04/24/17 1639 04/24/17 2027 04/24/17 2300 04/25/17 0442  NA 135 139  --  140  --   --   --   --  138  K 5.8* 6.5*  < > 5.6* 4.9 4.9 4.9 4.5 4.5  CL 101 107  --  109  --   --   --   --  107  CO2 22 25  --  23  --   --   --   --  25  GLUCOSE 222* 202*  --  222*  --   --   --   --  165*  BUN 41* 47*  --  64*  --   --   --   --  47*  CREATININE 1.18 1.23  --  1.74*  --   --   --   --  1.15  CALCIUM 8.1* 8.1*  --  8.0*  --   --   --   --  7.9*  < > = values in this interval not displayed. Liver Function Tests:  Recent Labs Lab 04/23/17 0101  AST 34  ALT 26  ALKPHOS 78  BILITOT 1.1  PROT 5.5*  ALBUMIN 2.7*   CBC:  Recent Labs Lab 04/23/17 0101  04/24/17 1639 04/24/17 2027 04/24/17 2300 04/25/17 0442 04/26/17 0552  WBC 11.3*  < > 18.7* 15.6* 13.1* 11.8* 9.0  NEUTROABS 9.0*  --   --   --   --   --   --   HGB 10.3*  < > 8.4* 7.7* 7.4* 7.1* 8.5*  HCT 32.0*  < > 25.0* 22.9* 22.0* 22.3* 25.4*  MCV 99.7  < > 94.3 94.2 94.8 99.1 96.2  PLT 181  < > 138* 117* 114* 124* 114*  < > = values in this  interval not displayed.  CBG:  Recent Labs Lab 04/25/17 1952 04/26/17 0000 04/26/17 0449 04/26/17 0804 04/26/17 1138  GLUCAP 169* 177* 154* 149* 173*    Principal Problem:   GIB (gastrointestinal bleeding) Active Problems:   Pacemaker   Chronic diastolic heart failure (HCC)   Chronic obstructive pulmonary disease (HCC)   OSA (obstructive sleep apnea)   Controlled diabetes mellitus type 2 with complications (HCC)   AKI (acute kidney injury) (HCC)   Hyperpotassemia   Hemorrhagic shock (HCC)   Acute posthemorrhagic anemia   Chronic or  unspecified peptic ulcer, unspecified site, with hemorrhage, without mention of obstruction   Acute kidney failure, unspecified (HCC)   Type II or unspecified type diabetes mellitus without mention of complication, not stated as uncontrolled   Time coordinating discharge: 73 MINUTES  Signed:  Brendia Sacks, MD [facility] 04/26/2017, 3:07 PM

## 2017-04-26 NOTE — Care Management Important Message (Signed)
Important Message  Patient Details  Name: Alan CollumRobert C Briggs MRN: [id number] Date of Birth: 05/16/1922   Medicare Important Message Given:  Yes    Malcolm MetroChildress, Dwan Hemmelgarn Demske, RN 04/26/2017, 1:56 PM

## 2017-04-26 NOTE — Progress Notes (Signed)
Femoral line removed, line was intact. Patient tolerated procedure well.

## 2017-04-26 NOTE — Plan of Care (Signed)
Problem: Pain Managment: Goal: General experience of comfort will improve Outcome: Progressing Pt c/o pain in L shoulder pain d/t fracture. Pain medication ordered and given x1. Pt resting quietly afterward. Pt turned q2h for comfort and skin. Will continue to monitor pt.

## 2017-04-26 NOTE — Clinical Social Work Placement (Addendum)
   CLINICAL SOCIAL WORK PLACEMENT  NOTE  Date:  04/26/2017  Patient Details  Name: Alan CollumRobert C Larouche MRN: [id number] Date of Birth: Feb 10, 1922  Clinical Social Work is seeking post-discharge placement for this patient at the Skilled  Nursing Facility level of care (*CSW will initial, date and re-position this form in  chart as items are completed):  Yes   Patient/family provided with [facility] Clinical Social Work Department's list of facilities offering this level of care within the geographic area requested by the patient (or if unable, by the patient's family).  Yes   Patient/family informed of their freedom to choose among providers that offer the needed level of care, that participate in Medicare, Medicaid or managed care program needed by the patient, have an available bed and are willing to accept the patient.  Yes   Patient/family informed of [facility]'s ownership interest in [facility] and [facility], as well as of the fact that they are under no obligation to receive care at these facilities.  PASRR submitted to EDS on       PASRR number received on       Existing PASRR number confirmed on 04/26/17     FL2 transmitted to all facilities in geographic area requested by pt/family on 04/26/17     FL2 transmitted to all facilities within larger geographic area on       Patient informed that his/her managed care company has contracts with or will negotiate with certain facilities, including the following:            Patient/family informed of bed offers received.  04/26/2017   [facility]  Physician [facility] and patient [facility]   on  .  04/26/2017   Patient to be transferred to facility by       EMS Patient family notified on   of transfer.  Son  Name of family member notified:      son  PHYSICIAN       Additional Comment:     _______________________________________________ Annice NeedySettle, Heather D, LCSW 04/26/2017, 1:17 PM

## 2017-04-26 NOTE — Progress Notes (Signed)
Discharge instructions gone over with patient and report called into SNF. Spoke with Eliberto Ivoryosa Buckley, LPN to give report at 1545.

## 2017-04-26 NOTE — Progress Notes (Signed)
    Subjective: States he "doesn't feel worth a darn". No abdominal pain, N/V, rectal bleeding. Bothered that he can't use his left arm. Difficulty raising right arm. Has to be fed. Doesn't want to advance diet.   Objective: Vital signs in last 24 hours: Temp:  [97.6 F (36.4 C)-99.4 F (37.4 C)] 97.6 F (36.4 C) (07/10 0500) Pulse Rate:  [60-72] 61 (07/10 0500) Resp:  [7-25] 18 (07/10 0500) BP: (120-144)/(44-74) 144/61 (07/10 0500) SpO2:  [94 %-98 %] 97 % (07/10 0500) Last BM Date: 04/25/17 General:   Alert and oriented, pleasant Head:  Normocephalic and atraumatic. Eyes:  No icterus, sclera clear. Conjuctiva pink.  Abdomen:  Bowel sounds present, soft, non-tender, non-distended.  Neurologic:  Alert and  oriented x4 Psych:  Alert and cooperative. Normal mood and affect.  Intake/Output from previous day: 07/09 0701 - 07/10 0700 In: 1180.3 [P.O.:400; I.V.:445.3; Blood:335] Out: 1600 [Urine:1600] Intake/Output this shift: Total I/O In: 240 [P.O.:240] Out: -   Lab Results:  Recent Labs  04/24/17 2300 04/25/17 0442 04/26/17 0552  WBC 13.1* 11.8* 9.0  HGB 7.4* 7.1* 8.5*  HCT 22.0* 22.3* 25.4*  PLT 114* 124* 114*   BMET  Recent Labs  04/24/17 0645  04/24/17 2027 04/24/17 2300 04/25/17 0442  NA 140  --   --   --  138  K 5.6*  < > 4.9 4.5 4.5  CL 109  --   --   --  107  CO2 23  --   --   --  25  GLUCOSE 222*  --   --   --  165*  BUN 64*  --   --   --  47*  CREATININE 1.74*  --   --   --  1.15  CALCIUM 8.0*  --   --   --  7.9*  < > = values in this interval not displayed.  Assessment: 83 year old male admitted with hemodynamically significant lower GI bleed s/p EGD and colonoscopy as noted on 04/24/17. Incidental findings of esophagitis and multiple duodenal ulcers that did not contribute to clinical presentation, and colonoscopy without active bleeding or culprit bleeding lesion. GI bleed likely secondary to occult Dieulafoy or AVM.   Received 1 unit of blood  yesterday, with a total of 4 since admission. Without any further GI bleeding and Hgb is 8.5 today. If evidence of recurrent bleeding, will need stat tagged RBC scan.    Plan: Continue full liquids: patient does not want to advance diet currently Monitor for any further overt GI bleeding Will follow-up on H.pylori serology as comes available PPI BID Avoid NSAIDs and aspirin   Gelene MinkAnna W. Cori Justus, PhD, ANP-BC [facility]    LOS: 3 days    04/26/2017, 9:15 AM

## 2017-04-26 NOTE — [place] FL2 (Signed)
[place] MEDICAID FL2 LEVEL OF CARE SCREENING TOOL     IDENTIFICATION  Patient Name: Alan Briggs Birthdate: 01-14-1922 Sex: male Admission Date (Current Location): 04/23/2017  [facility] and [place] Number:  [employer] and Address:  [facility],  [address], [place] [address]      Provider Number: [id number]  Attending Physician Name and Address:  Standley Brooking, MD  Relative Name and Phone Number:       Current Level of Care: Hospital Recommended Level of Care: Skilled Nursing Facility Prior Approval Number:    Date Approved/Denied:   PASRR Number: [id number] ([id number] )  Discharge Plan: SNF    Current Diagnoses: Patient Active Problem List   Diagnosis Date Noted  . Chronic or unspecified peptic ulcer, unspecified site, with hemorrhage, without mention of obstruction   . Acute kidney failure, unspecified (HCC)   . Type II or unspecified type diabetes mellitus without mention of complication, not stated as uncontrolled   . GIB (gastrointestinal bleeding) 04/23/2017  . Hyperpotassemia 04/23/2017  . Hemorrhagic shock (HCC) 04/23/2017  . Acute posthemorrhagic anemia 04/23/2017  . Humerus fracture 03/06/2017  . Essential hypertension 03/06/2017  . AKI (acute kidney injury) (HCC) 03/06/2017  . Pancreatic mass   . Chronic obstructive pulmonary disease (HCC)   . OSA (obstructive sleep apnea)   . Controlled diabetes mellitus type 2 with complications (HCC)   . Sick sinus syndrome (HCC)   . Presence of permanent cardiac pacemaker   . Dislocation of fifth toe, right, closed   . Abdominal pain 04/17/2016  . Pacemaker 09/30/2015  . Chronic diastolic heart failure (HCC) 09/30/2015  . Venous insufficiency 09/30/2015    Orientation RESPIRATION BLADDER Height & Weight     Self, Situation, Place  Normal Incontinent Weight: 267 lb 10.2 oz (121.4 kg) Height:  6' (182.9 cm)  BEHAVIORAL SYMPTOMS/MOOD NEUROLOGICAL BOWEL NUTRITION STATUS   Continent Diet (see discharge summary)  AMBULATORY STATUS COMMUNICATION OF NEEDS Skin   Limited Assist Verbally PU Stage and Appropriate Care (buttocks, right)   PU Stage 2 Dressing:  (Foam dressing, PRN)                   Personal Care Assistance Level of Assistance  Bathing, Feeding, Dressing Bathing Assistance: Limited assistance Feeding assistance: Independent Dressing Assistance: Limited assistance     Functional Limitations Info  Sight, Hearing, Speech Sight Info: Adequate Hearing Info: Adequate Speech Info: Adequate    SPECIAL CARE FACTORS FREQUENCY  PT (By licensed PT)     PT Frequency: 5x/week              Contractures Contractures Info: Not present    Additional Factors Info  Isolation Precautions Code Status Info: Full Code Allergies Info: Advair Diskus, Combivent, Lotensin Hct, Penicillins, Simvastatin,      Isolation Precautions Info: MRSA + by PCR 04/23/2017     Current Medications (04/26/2017):  This is the current hospital active medication list Current Facility-Administered Medications  Medication Dose Route Frequency Provider Last Rate Last Dose  . 0.9 %  sodium chloride infusion   Intravenous Once Tarry Kos A, MD      . acetaminophen (TYLENOL) tablet 650 mg  650 mg Oral Q6H PRN Kirby-Graham, Beather Arbour, NP      . albuterol (PROVENTIL) (2.5 MG/3ML) 0.083% nebulizer solution 2.5 mg  2.5 mg Nebulization Once Standley Brooking, MD      . Chlorhexidine Gluconate Cloth 2 % PADS 6 each  6 each  Topical Q0600 Standley BrookingGoodrich, Daniel P, MD   6 each at 04/26/17 [phone]  . furosemide (LASIX) tablet 40 mg  40 mg Oral Daily Standley BrookingGoodrich, Daniel P, MD   40 mg at 04/26/17 0813  . insulin glargine (LANTUS) injection 10 Units  10 Units Subcutaneous QHS Standley BrookingGoodrich, Daniel P, MD   10 Units at 04/25/17 2005  . mupirocin ointment (BACTROBAN) 2 % 1 application  1 application Nasal BID Standley BrookingGoodrich, Daniel P, MD   1 application at 04/26/17 [phone]  . ondansetron (ZOFRAN) injection 4 mg  4  mg Intravenous Q6H PRN Standley BrookingGoodrich, Daniel P, MD      . pantoprazole (PROTONIX) EC tablet 40 mg  40 mg Oral BID AC Gelene MinkBoone, Anna W, NP   40 mg at 04/26/17 0544  . sodium chloride 0.9 % bolus 1,000 mL  1,000 mL Intravenous Once Tarry Kosavid, Rachal A, MD      . sodium chloride flush (NS) 0.9 % injection 10-40 mL  10-40 mL Intracatheter Q12H Franky MachoJenkins, Mark, MD   10 mL at 04/26/17 0814  . sodium chloride flush (NS) 0.9 % injection 10-40 mL  10-40 mL Intracatheter PRN Franky MachoJenkins, Mark, MD      . traMADol Janean Sark(ULTRAM) tablet 50 mg  50 mg Oral Q12H PRN Leda GauzeKirby-Graham, Karen J, NP   50 mg at 04/25/17 2308     Discharge Medications: Please see discharge summary for a list of discharge medications.  Relevant Imaging Results:  Relevant Lab Results:   Additional Information SSN: [id number]  Luisalberto Beegle, Juleen ChinaHeather D, LCSW

## 2017-04-26 NOTE — Care Management Note (Addendum)
Case Management Note  Patient Details  Name: Alan CollumRobert C Briggs MRN: [id number] Date of Birth: 1922-06-06  Expected Discharge Date:      04/26/2017            Expected Discharge Plan:  Skilled Nursing Facility  In-House Referral:  Clinical Social Work  Discharge planning Services  CM Consult  Post Acute Care Choice:  NA Choice offered to:  NA  Status of Service:  Completed, signed off   Additional Comments: Pt discharging today to SNF. CSW has made arrangements. [facility] aware of DC plan.  Malcolm Metrohildress, Kendy Haston Demske, RN 04/26/2017, 1:56 PM

## 2017-04-26 NOTE — Evaluation (Signed)
Physical Therapy Evaluation Patient Details Name: Alan Briggs MRN: [id number] DOB: Jun 22, 1922 Today's Date: 04/26/2017   History of Present Illness  83 y.o. male with PMHx DM, SSS with pacer, hx of Afib not on anticoagulation due to recurrent falls, fall with L humeral fracture and OSA who presents with anemia from GI bleed with polyp noted on colonoscopy.  Bleeding controlled and is NWB on LUE, referred to PT after recent rehab admission.  Clinical Impression  Pt is up to sit bedside reluctantly, but after getting there was able to maneuver his upper body somewhat with care for LUE.  He is able to assist scooting up the bed in trendelenburg head down, and then positioned with UE's elevated after.  Anticipate dc to SNF again mainly due to poor standing control, poor control of trunk with LUE no longer in a sling, and limited tolerance for side of bed due to being afraid he will fall again.  Will focus inpt on his LE strength and getting up to stand and will expect him to transition to SNF afterward.  If SNF not approved will need 24/7 help at home.    Follow Up Recommendations SNF    Equipment Recommendations  None recommended by PT    Recommendations for Other Services       Precautions / Restrictions Precautions Precautions: Fall (telemetry) Restrictions Weight Bearing Restrictions: No      Mobility  Bed Mobility Overal bed mobility: Needs Assistance Bed Mobility: Supine to Sit;Sit to Supine     Supine to sit: Mod assist;Max assist Sit to supine: Max assist   General bed mobility comments: unable to be assisted with   Transfers Overall transfer level: Needs assistance Equipment used: 1 person hand held assist (bed pad) Transfers: Lateral/Scoot Transfers          Lateral/Scoot Transfers: Mod assist;From elevated surface (with bed pad and avoiding LUE) General transfer comment: Pt assisted to use RUE to support LUE and could clear hips on bed  briefly  Ambulation/Gait             General Gait Details: declined to attempt  Stairs            Wheelchair Mobility    Modified Rankin (Stroke Patients Only)       Balance Overall balance assessment: History of Falls;Needs assistance Sitting-balance support: Feet supported Sitting balance-Leahy Scale: Fair         Standing balance comment: unable to attempt                             Pertinent Vitals/Pain Pain Assessment: Faces Faces Pain Scale: Hurts even more Pain Location: L arm with assisted movement Pain Descriptors / Indicators: Sore Pain Intervention(s): Monitored during session;Repositioned;Premedicated before session    Home Living Family/patient expects to be discharged to:: Private residence Living Arrangements: Children Available Help at Discharge: Family Type of Home: House Home Access: Stairs to enter Entrance Stairs-Rails: Left Entrance Stairs-Number of Steps: 4-5 with HR Home Layout: One level Home Equipment: Environmental consultant - 2 wheels;Cane - single point;Bedside commode;Shower seat      Prior Function Level of Independence: Independent with assistive device(s)         Comments: Sons assist with house and cooking     Hand Dominance   Dominant Hand: Right    Extremity/Trunk Assessment   Upper Extremity Assessment Upper Extremity Assessment: Generalized weakness;LUE deficits/detail LUE Deficits / Details: weak and has humeral  fracture LUE: Unable to fully assess due to pain;Unable to fully assess due to immobilization LUE Coordination: decreased fine motor;decreased gross motor    Lower Extremity Assessment Lower Extremity Assessment: Generalized weakness    Cervical / Trunk Assessment Cervical / Trunk Assessment: Normal  Communication   Communication: HOH  Cognition Arousal/Alertness: Awake/alert Behavior During Therapy: WFL for tasks assessed/performed Overall Cognitive Status: Within Functional Limits for  tasks assessed                                        General Comments General comments (skin integrity, edema, etc.): Pt was able to get up to bedside and declined to try to stand although he could move hips a little on the bed before feeling like he might fall.  Has apparently developed a fear of falling    Exercises     Assessment/Plan    PT Assessment Patient needs continued PT services  PT Problem List Decreased strength;Decreased range of motion;Decreased activity tolerance;Decreased balance;Decreased mobility;Decreased coordination;Decreased knowledge of use of DME;Decreased safety awareness;Decreased knowledge of precautions;Obesity;Decreased skin integrity;Pain       PT Treatment Interventions DME instruction;Gait training;Stair training;Therapeutic exercise;Functional mobility training;Therapeutic activities;Balance training;Neuromuscular re-education;Patient/family education    PT Goals (Current goals can be found in the Care Plan section)  Acute Rehab PT Goals Patient Stated Goal: to get stronger and have less pain on UE's PT Goal Formulation: With patient Time For Goal Achievement: 05/10/17 Potential to Achieve Goals: Good    Frequency Min 3X/week   Barriers to discharge Inaccessible home environment stairs with one rail to enter house, but reports his sons are working on a ramp    Co-evaluation               AM-PAC PT "6 Clicks" Daily Activity  Outcome Measure Difficulty turning over in bed (including adjusting bedclothes, sheets and blankets)?: Total Difficulty moving from lying on back to sitting on the side of the bed? : Total Difficulty sitting down on and standing up from a chair with arms (e.g., wheelchair, bedside commode, etc,.)?: Total Help needed moving to and from a bed to chair (including a wheelchair)?: A Lot Help needed walking in hospital room?: A Lot Help needed climbing 3-5 steps with a railing? : Total 6 Click Score:  8    End of Session   Activity Tolerance: Patient limited by fatigue;Other (comment) (fearful of losing his balance) Patient left: in bed;with call bell/phone within reach;with bed alarm set Nurse Communication: Mobility status PT Visit Diagnosis: Other abnormalities of gait and mobility (R26.89);Muscle weakness (generalized) (M62.81);History of falling (Z91.81);Pain Pain - Right/Left: Left Pain - part of body: Arm    Time: 1001-1043 PT Time Calculation (min) (ACUTE ONLY): 42 min   Charges:   PT Evaluation $PT Eval Moderate Complexity: 1 Procedure PT Treatments $Therapeutic Activity: 23-37 mins   PT G Codes:   PT G-Codes **NOT FOR INPATIENT CLASS** Functional Assessment Tool Used: AM-PAC 6 Clicks Basic Mobility    Ivar DrapeRuth E Koby Pickup 04/26/2017, 12:35 PM   Samul Dadauth Sherah Lund, PT MS Acute Rehab Dept. Number: [facility] [phone] and [facility] [phone]

## 2017-04-27 DIAGNOSIS — K922 Gastrointestinal hemorrhage, unspecified: Secondary | ICD-10-CM | POA: Diagnosis not present

## 2017-04-27 DIAGNOSIS — J449 Chronic obstructive pulmonary disease, unspecified: Secondary | ICD-10-CM | POA: Diagnosis not present

## 2017-04-27 DIAGNOSIS — E118 Type 2 diabetes mellitus with unspecified complications: Secondary | ICD-10-CM | POA: Diagnosis not present

## 2017-04-27 DIAGNOSIS — D6489 Other specified anemias: Secondary | ICD-10-CM | POA: Diagnosis not present

## 2017-04-28 ENCOUNTER — Encounter: Payer: Self-pay | Admitting: Internal Medicine

## 2017-05-03 ENCOUNTER — Emergency Department (HOSPITAL_COMMUNITY): Payer: Medicare Other

## 2017-05-03 ENCOUNTER — Encounter (HOSPITAL_COMMUNITY): Payer: Self-pay | Admitting: Emergency Medicine

## 2017-05-03 ENCOUNTER — Emergency Department (HOSPITAL_COMMUNITY)
Admission: EM | Admit: 2017-05-03 | Discharge: 2017-05-03 | Disposition: A | Discharge status: Home / Self Care | Payer: Medicare Other | Attending: Emergency Medicine | Admitting: Emergency Medicine

## 2017-05-03 DIAGNOSIS — I110 Hypertensive heart disease with heart failure: Secondary | ICD-10-CM | POA: Insufficient documentation

## 2017-05-03 DIAGNOSIS — R05 Cough: Secondary | ICD-10-CM | POA: Diagnosis not present

## 2017-05-03 DIAGNOSIS — R0602 Shortness of breath: Secondary | ICD-10-CM

## 2017-05-03 DIAGNOSIS — I5023 Acute on chronic systolic (congestive) heart failure: Secondary | ICD-10-CM | POA: Diagnosis not present

## 2017-05-03 DIAGNOSIS — R0609 Other forms of dyspnea: Secondary | ICD-10-CM | POA: Diagnosis not present

## 2017-05-03 DIAGNOSIS — S42202D Unspecified fracture of upper end of left humerus, subsequent encounter for fracture with routine healing: Secondary | ICD-10-CM | POA: Diagnosis not present

## 2017-05-03 DIAGNOSIS — R0600 Dyspnea, unspecified: Secondary | ICD-10-CM | POA: Diagnosis not present

## 2017-05-03 DIAGNOSIS — J449 Chronic obstructive pulmonary disease, unspecified: Secondary | ICD-10-CM | POA: Diagnosis not present

## 2017-05-03 DIAGNOSIS — R42 Dizziness and giddiness: Secondary | ICD-10-CM | POA: Diagnosis not present

## 2017-05-03 DIAGNOSIS — E1129 Type 2 diabetes mellitus with other diabetic kidney complication: Secondary | ICD-10-CM | POA: Insufficient documentation

## 2017-05-03 DIAGNOSIS — Z950 Presence of cardiac pacemaker: Secondary | ICD-10-CM | POA: Insufficient documentation

## 2017-05-03 DIAGNOSIS — M216X2 Other acquired deformities of left foot: Secondary | ICD-10-CM | POA: Diagnosis not present

## 2017-05-03 DIAGNOSIS — R531 Weakness: Secondary | ICD-10-CM

## 2017-05-03 DIAGNOSIS — I5032 Chronic diastolic (congestive) heart failure: Secondary | ICD-10-CM | POA: Insufficient documentation

## 2017-05-03 DIAGNOSIS — L84 Corns and callosities: Secondary | ICD-10-CM | POA: Diagnosis not present

## 2017-05-03 DIAGNOSIS — E118 Type 2 diabetes mellitus with unspecified complications: Secondary | ICD-10-CM | POA: Diagnosis not present

## 2017-05-03 DIAGNOSIS — Z87891 Personal history of nicotine dependence: Secondary | ICD-10-CM | POA: Insufficient documentation

## 2017-05-03 LAB — CBC WITH DIFFERENTIAL/PLATELET
BASOS ABS: 0 10*3/uL (ref 0.0–0.1)
BASOS PCT: 0 %
EOS ABS: 0.1 10*3/uL (ref 0.0–0.7)
Eosinophils Relative: 2 %
HEMATOCRIT: 32.6 % — AB (ref 39.0–52.0)
HEMOGLOBIN: 10.3 g/dL — AB (ref 13.0–17.0)
Lymphocytes Relative: 12 %
Lymphs Abs: 1 10*3/uL (ref 0.7–4.0)
MCH: 31.1 pg (ref 26.0–34.0)
MCHC: 31.6 g/dL (ref 30.0–36.0)
MCV: 98.5 fL (ref 78.0–100.0)
MONOS PCT: 10 %
Monocytes Absolute: 0.9 10*3/uL (ref 0.1–1.0)
NEUTROS ABS: 6.9 10*3/uL (ref 1.7–7.7)
NEUTROS PCT: 76 %
Platelets: 150 10*3/uL (ref 150–400)
RBC: 3.31 MIL/uL — AB (ref 4.22–5.81)
RDW: 17.5 % — ABNORMAL HIGH (ref 11.5–15.5)
WBC: 9 10*3/uL (ref 4.0–10.5)

## 2017-05-03 LAB — URINALYSIS, ROUTINE W REFLEX MICROSCOPIC
BACTERIA UA: NONE SEEN
BILIRUBIN URINE: NEGATIVE
Glucose, UA: NEGATIVE mg/dL
HGB URINE DIPSTICK: NEGATIVE
KETONES UR: NEGATIVE mg/dL
NITRITE: NEGATIVE
PROTEIN: NEGATIVE mg/dL
SPECIFIC GRAVITY, URINE: 1.005 (ref 1.005–1.030)
pH: 7 (ref 5.0–8.0)

## 2017-05-03 LAB — COMPREHENSIVE METABOLIC PANEL
ALK PHOS: 104 U/L (ref 38–126)
ALT: 23 U/L (ref 17–63)
AST: 22 U/L (ref 15–41)
Albumin: 2.5 g/dL — ABNORMAL LOW (ref 3.5–5.0)
Anion gap: 7 (ref 5–15)
BUN: 11 mg/dL (ref 6–20)
CALCIUM: 8.2 mg/dL — AB (ref 8.9–10.3)
CHLORIDE: 98 mmol/L — AB (ref 101–111)
CO2: 30 mmol/L (ref 22–32)
CREATININE: 0.77 mg/dL (ref 0.61–1.24)
Glucose, Bld: 142 mg/dL — ABNORMAL HIGH (ref 65–99)
Potassium: 4.2 mmol/L (ref 3.5–5.1)
SODIUM: 135 mmol/L (ref 135–145)
Total Bilirubin: 0.7 mg/dL (ref 0.3–1.2)
Total Protein: 5.7 g/dL — ABNORMAL LOW (ref 6.5–8.1)

## 2017-05-03 LAB — CBG MONITORING, ED: GLUCOSE-CAPILLARY: 125 mg/dL — AB (ref 65–99)

## 2017-05-03 MED ORDER — MECLIZINE HCL 25 MG PO TABS: 25.0000 mg | ORAL_TABLET | Freq: Three times a day (TID) | ORAL | 0 refills | Status: DC | PRN

## 2017-05-03 MED ORDER — MECLIZINE HCL 12.5 MG PO TABS
12.5000 mg | ORAL_TABLET | Freq: Once | ORAL | Status: AC
  Administered 2017-05-03: 12.5 mg via ORAL
  Filled 2017-05-03: qty 1

## 2017-05-03 MED ORDER — SODIUM CHLORIDE 0.9 % IV BOLUS (SEPSIS)
500.0000 mL | Freq: Once | INTRAVENOUS | Status: AC
  Administered 2017-05-03: 500 mL via INTRAVENOUS

## 2017-05-03 NOTE — ED Notes (Signed)
Pt states he is hot.  Turned thermostat down and [facility]

## 2017-05-03 NOTE — ED Notes (Signed)
Apologized again for the wait for transportation.  Explained that we had to wait for EMS to free a truck up.  Provided pt with water and resituated bed.  No further requests at this time

## 2017-05-03 NOTE — Discharge Instructions (Signed)
We believe your symptoms were caused by benign vertigo.  Please read through the included information and take any prescribed medication(s).  Follow up with your doctor as listed above.  If you develop any new or worsening symptoms that concern you, including but not limited to persistent dizziness/vertigo, numbness or weakness in your arms or legs, altered mental status, persistent vomiting, or fever greater than 101, please return immediately to the Emergency Department.  

## 2017-05-03 NOTE — ED Notes (Signed)
Pt provided a urinal. Pt waiting on EMS for transportation back to nursing home.

## 2017-05-03 NOTE — ED Triage Notes (Signed)
[facility] [place] with complaint of intermittent dizziness x 1 week. States dizziness is worse with movement.

## 2017-05-03 NOTE — ED Provider Notes (Signed)
Emergency Department Provider Note   I have reviewed the triage vital signs and the nursing notes.   HISTORY  Chief Complaint Dizziness   HPI Alan Briggs is a 83 y.o. male with PMH of SSS s/p pacemaker, DM, GERD, Pulmonary HTN, HLD, and chronic venous insufficiency presents to the emergency department for evaluation of intermittent lightheadedness/vertigo. Patient states that since his recent colonoscopy he's had intermittent vertigo symptoms. He states he feels like "I'm on a roller coaster" any time he sits up or moves. He denies any symptoms at rest. He reports generalized weakness that has been present since recent hospitalization and ultimately discharged to the [facility] for rehab. Patient denies any fevers or chills. No ringing or pain in the ears. No headache or neck discomfort. No history of similar vertigo in the past. No dysuria, hesitancy, urgency. He has not noticed any blood in the stool. No abdominal discomfort. Has not noticed any speech changes, difficulty swallowing, weakness/numbness in his arms or legs.  Past Medical History:  Diagnosis Date  . Cardiac pacemaker in situ    for sick sinus syndrome-last placement was 09/2007 [facility]  . Diabetes [facility])   . GERD (gastroesophageal reflux disease)   . Mixed hyperlipidemia   . Obstructive lung disease (HCC)    non compliant with home O2  . Pulmonary hypertension (HCC)   . Sleep apnea   . Thrombocytopenia (HCC)   . Venous insufficiency (chronic) (peripheral)     Patient Active Problem List   Diagnosis Date Noted  . Chronic or unspecified peptic ulcer, unspecified site, with hemorrhage, without mention of obstruction   . Acute kidney failure, unspecified (HCC)   . Type II or unspecified type diabetes mellitus without mention of complication, not stated as uncontrolled   . GIB (gastrointestinal bleeding) 04/23/2017  . Hyperpotassemia 04/23/2017  . Hemorrhagic shock (HCC) 04/23/2017  . Acute  posthemorrhagic anemia 04/23/2017  . Humerus fracture 03/06/2017  . Essential hypertension 03/06/2017  . AKI (acute kidney injury) (HCC) 03/06/2017  . Pancreatic mass   . Chronic obstructive pulmonary disease (HCC)   . OSA (obstructive sleep apnea)   . Controlled diabetes mellitus type 2 with complications (HCC)   . Sick sinus syndrome (HCC)   . Presence of permanent cardiac pacemaker   . Dislocation of fifth toe, right, closed   . Abdominal pain 04/17/2016  . Pacemaker 09/30/2015  . Chronic diastolic heart failure (HCC) 09/30/2015  . Venous insufficiency 09/30/2015    Past Surgical History:  Procedure Laterality Date  . CATARACT EXTRACTION, BILATERAL Bilateral   . COLONOSCOPY N/A 04/24/2017   Procedure: COLONOSCOPY;  Surgeon: Corbin Ade, MD;  [facility];  Service: Endoscopy;  Laterality: N/A;  . ESOPHAGOGASTRODUODENOSCOPY N/A 04/24/2017   Procedure: ESOPHAGOGASTRODUODENOSCOPY (EGD);  Surgeon: Corbin Ade, MD;  Location: [facility];  Service: Endoscopy;  Laterality: N/A;  . KIDNEY STONE SURGERY Left    laser ablation   . PACEMAKER IMPLANT    . PERCUTANEOUS PINNING Right 04/20/2016   Procedure: IRRIGATION AND DEBRIDEMENT RIGHT FOOT, PERCUTANEOUS PINNING SMALL TOE;  Surgeon: Tarry Kos, MD;  Location: [facility] OR;  Service: Orthopedics;  Laterality: Right;  . REFRACTIVE SURGERY Right   . ROTATOR CUFF REPAIR Left     Current Outpatient Rx  . Order #: [id number] Class: Historical Med  . Order #: [id number] Class: Historical Med  . Order #: [id number] Class: No Print  . Order #: [id number] Class: Historical Med  . Order #: [id number] Class: Normal  .  Order #: [id number] Class: Historical Med  . Order #: [id number] Class: Historical Med  . Order #: [id number] Class: Historical Med  . Order #: [id number] Class: Historical Med  . Order #: [id number] Class: Normal  . Order #: [id number] Class: Historical Med  . Order #: [id number] Class: Print  . Order #: [id number] Class: Print     Allergies Advair diskus [fluticasone-salmeterol]; Combivent [ipratropium-albuterol]; Lotensin hct [benazepril-hydrochlorothiazide]; Penicillins; and Simvastatin  Family History  Problem Relation Age of Onset  . Diabetes Mother   . Colon cancer Neg Hx   . Colon polyps Neg Hx     Social History Social History  Substance Use Topics  . Smoking status: Former Smoker    Packs/day: 0.50    Years: 30.00    Types: Cigarettes    Start date: 10/18/1948    Quit date: 10/18/1978  . Smokeless tobacco: Never Used  . Alcohol use No    Review of Systems  Constitutional: No fever/chills. Positive generalized weakness.  Eyes: No visual changes.  ENT: No sore throat. Positive positional vertigo.  Cardiovascular: Denies chest pain. Respiratory: Denies shortness of breath. Gastrointestinal: No abdominal pain.  No nausea, no vomiting.  No diarrhea.  No constipation. Genitourinary: Negative for dysuria. Musculoskeletal: Negative for back pain. Skin: Negative for rash. Neurological: Negative for headaches, focal weakness or numbness.  10-point ROS otherwise negative.  ____________________________________________   PHYSICAL EXAM:  VITAL SIGNS: ED Triage Vitals  Enc Vitals Group     BP 05/03/17 1138 112/83     Pulse Rate 05/03/17 1138 83     Resp 05/03/17 1138 13     Temp 05/03/17 1138 98.5 F (36.9 C)     Temp Source 05/03/17 1138 Oral     SpO2 05/03/17 1138 94 %     Weight 05/03/17 1136 267 lb (121.1 kg)     Height 05/03/17 1136 6' (1.829 m)   Constitutional: Alert and oriented. Well appearing and in no acute distress. Eyes: Conjunctivae are normal. PERRL. EOMI. Head: Atraumatic. Nose: No congestion/rhinnorhea. Mouth/Throat: Mucous membranes are moist.   Neck: No stridor.  Cardiovascular: Normal rate, regular rhythm. Good peripheral circulation. Grossly normal heart sounds.   Respiratory: Normal respiratory effort.  No retractions. Lungs CTAB. Gastrointestinal: Soft and  nontender. No distention.  Musculoskeletal: No lower extremity tenderness nor edema. Healing left arm fracture with limited ROM at the wrist with pain. Otherwise well-appearing.  Neurologic:  Normal speech and language. No gross focal neurologic deficits are appreciated. Normal finger to nose testing on the right. Limited exam with recent left arm fracture and pain with ROM. Normal CN exam 2-12. Normal heal-to-shin.  Skin:  Skin is warm, dry and intact. No rash noted. Psychiatric: Mood and affect are normal. Speech and behavior are normal.  ____________________________________________   LABS (all labs ordered are listed, but only abnormal results are displayed)  Labs Reviewed  COMPREHENSIVE METABOLIC PANEL - Abnormal; Notable for the following:       Result Value   Chloride 98 (*)    Glucose, Bld 142 (*)    Calcium 8.2 (*)    Total Protein 5.7 (*)    Albumin 2.5 (*)    All other components within normal limits  CBC WITH DIFFERENTIAL/PLATELET - Abnormal; Notable for the following:    RBC 3.31 (*)    Hemoglobin 10.3 (*)    HCT 32.6 (*)    RDW 17.5 (*)    All other components within normal limits  URINALYSIS, ROUTINE W REFLEX MICROSCOPIC - Abnormal; Notable for the following:  Color, Urine STRAW (*)    Leukocytes, UA TRACE (*)    Squamous Epithelial / LPF 0-5 (*)    All other components within normal limits   ____________________________________________  EKG   EKG Interpretation  Date/Time:  Tuesday May 03 2017 11:39:51 EDT Ventricular Rate:  74 PR Interval:    QRS Duration: 165 QT Interval:  438 QTC Calculation: 486 R Axis:   -61 Text Interpretation:  Afib/flutter and ventricular-paced rhythm No further analysis attempted due to paced rhythm Confirmed by Alona Bene [phone]) on 05/03/2017 1:08:27 PM       ____________________________________________  RADIOLOGY  Dg Chest 1 View  Result Date: 05/03/2017 CLINICAL DATA:  Cough and dizziness. EXAM: CHEST 1 VIEW  COMPARISON:  Mar 05, 2017 FINDINGS: There are small pleural effusions bilaterally. There is atelectatic change in the right lower lobe. There is no frank consolidation. Heart is mildly enlarged with pulmonary vascularity within normal limits. There is aortic atherosclerosis. No adenopathy. Pacemaker leads are attached to the right atrium and right ventricle. There is a comminuted fracture of the proximal humeral metaphysis on the left. IMPRESSION: Small pleural effusions bilaterally with right base atelectasis. Lungs elsewhere clear. Stable cardiomegaly. There is aortic atherosclerosis. There is a comminuted fracture of the proximal left humerus, incompletely visualized. It may be prudent to obtain left humerus radiographs to further assess. Aortic Atherosclerosis (ICD10-I70.0). Electronically Signed   By: Bretta Bang III M.D.   On: 05/03/2017 13:01   Ct Head Wo Contrast  Result Date: 05/03/2017 CLINICAL DATA:  Intermittent dizziness for 1 week EXAM: CT HEAD WITHOUT CONTRAST TECHNIQUE: Contiguous axial images were obtained from the base of the skull through the vertex without intravenous contrast. COMPARISON:  03/05/2017 FINDINGS: Brain: Global atrophy. Chronic ischemic changes in the periventricular white matter. No mass effect, midline shift, or acute intracranial hemorrhage. Lacune or infarct in the left caudate head and left external capsule. Vascular: No hyperdense vessel or unexpected calcification. Skull: Intact Sinuses/Orbits: Mastoid air cells clear. Minimal fluid density in the ethmoid air cells. Other: Noncontributory IMPRESSION: No acute intracranial pathology. Chronic ischemic changes and atrophy are noted. Electronically Signed   By: Jolaine Click M.D.   On: 05/03/2017 13:04    ____________________________________________   PROCEDURES  Procedure(s) performed:   Procedures  None ____________________________________________   INITIAL IMPRESSION / ASSESSMENT AND PLAN / ED  COURSE  Pertinent labs & imaging results that were available during my care of the patient were reviewed by me and considered in my medical decision making (see chart for details).  Patient resents to the emergency department for evaluation of intermittent positional vertigo. No gross neurological deficits to suggest central vertigo cause.  Neuro exam is slightly limited by pain in the left upper extremity status post fracture. No other cerebellar findings on exam. With history of anemia and electrolyte abnormality plan for lab work, UA, CT head with recent fall, and chest x-ray to rule out underlying infectious process.   02:41 PM Patient with improved symptoms with Meclizine. Normal labs and UA. No clear findings to suggest central vertigo and emergent MRI plus patient with pacemaker. Plan for symptom mgmt at home and ENT referral for evaluation and vestibular rehab.   At this time, I do not feel there is any life-threatening condition present. I have reviewed and discussed all results (EKG, imaging, lab, urine as appropriate), exam findings with patient. I have reviewed nursing notes and appropriate previous records.  I feel the patient is safe to be discharged home without  further emergent workup. Discussed usual and customary return precautions. Patient and family (if present) verbalize understanding and are comfortable with this plan.  Patient will follow-up with their primary care provider. If they do not have a primary care provider, information for follow-up has been provided to them. All questions have been answered.  ____________________________________________  FINAL CLINICAL IMPRESSION(S) / ED DIAGNOSES  Final diagnoses:  Vertigo  General weakness  Dyspnea on exertion     MEDICATIONS GIVEN DURING THIS VISIT:  Medications  sodium chloride 0.9 % bolus 500 mL (0 mLs Intravenous Stopped 05/03/17 1434)  meclizine (ANTIVERT) tablet 12.5 mg (12.5 mg Oral Given 05/03/17 1313)      NEW OUTPATIENT MEDICATIONS STARTED DURING THIS VISIT:  New Prescriptions   MECLIZINE (ANTIVERT) 25 MG TABLET    Take 1 tablet (25 mg total) by mouth 3 (three) times daily as needed for dizziness.      Note:  This document was prepared using Dragon voice recognition software and may include unintentional dictation errors.  Alona Bene, MD Emergency Medicine    Sincere Berlanga, Arlyss Repress, MD 05/03/17 [phone]

## 2017-05-10 ENCOUNTER — Ambulatory Visit (INDEPENDENT_AMBULATORY_CARE_PROVIDER_SITE_OTHER): Payer: Self-pay | Admitting: Orthopedic Surgery

## 2017-05-10 DIAGNOSIS — S42202D Unspecified fracture of upper end of left humerus, subsequent encounter for fracture with routine healing: Secondary | ICD-10-CM

## 2017-05-10 NOTE — Progress Notes (Signed)
Fracture care follow-up  Chief Complaint  Patient presents with  . Follow-up      Recheck on left shoulder fracture, DOI 03-05-17.      Post injury day number  66  Fracture treated with  Sling swathe and PT   Current Outpatient Prescriptions:  .  acetaminophen (TYLENOL) 500 MG tablet, Take 1,000 mg by mouth every 6 (six) hours as needed for mild pain, moderate pain or headache. , Disp: , Rfl:  .  aspirin EC 81 MG tablet, Take 81 mg by mouth daily., Disp: , Rfl:  .  atenolol (TENORMIN) 25 MG tablet, Take 25 mg by mouth daily. , Disp: , Rfl:  .  furosemide (LASIX) 20 MG tablet, Take 1 tablet (20 mg total) by mouth daily., Disp: , Rfl:  .  HUMALOG KWIKPEN 100 UNIT/ML KiwkPen, Inject 20 Units into the skin every morning. , Disp: , Rfl: 1 .  insulin glargine (LANTUS) 100 UNIT/ML injection, Inject 0.1 mLs (10 Units total) into the skin at bedtime., Disp: 10 mL, Rfl: 11 .  meclizine (ANTIVERT) 25 MG tablet, Take 1 tablet (25 mg total) by mouth 3 (three) times daily as needed for dizziness., Disp: 30 tablet, Rfl: 0 .  Multiple Vitamin (MULTIVITAMIN WITH MINERALS) TABS tablet, Take 1 tablet by mouth daily., Disp: , Rfl:  .  Multiple Vitamins-Minerals (ICAPS AREDS 2) CAPS, Take 1 capsule by mouth daily., Disp: , Rfl:  .  Omega-3 Fatty Acids (FISH OIL) 1200 MG CAPS, Take 1 capsule by mouth daily., Disp: , Rfl:  .  oxyCODONE (OXY IR/ROXICODONE) 5 MG immediate release tablet, Take 5 mg by mouth every 6 (six) hours as needed. , Disp: , Rfl: 0 .  polyethylene glycol (MIRALAX / GLYCOLAX) packet, Take 17 g by mouth daily as needed for mild constipation., Disp: 14 each, Rfl: 0 .  traMADol (ULTRAM) 50 MG tablet, Take 1 tablet (50 mg total) by mouth every 12 (twelve) hours as needed for moderate pain (or Headache unrelieved by tylenol). (Patient not taking: Reported on 05/03/2017), Disp: 10 tablet, Rfl: 0   Clinical exam  The arm is moving as a unit-no tenderness there is some swelling of the hand  Overall  improved function  Encounter Diagnosis  Name Primary?  . Closed fracture of proximal end of left humerus with routine healing, unspecified fracture morphology, subsequent encounter Yes    Plan  Release

## 2017-05-24 DIAGNOSIS — J449 Chronic obstructive pulmonary disease, unspecified: Secondary | ICD-10-CM | POA: Diagnosis not present

## 2017-05-24 DIAGNOSIS — I5023 Acute on chronic systolic (congestive) heart failure: Secondary | ICD-10-CM | POA: Diagnosis not present

## 2017-05-24 DIAGNOSIS — E118 Type 2 diabetes mellitus with unspecified complications: Secondary | ICD-10-CM | POA: Diagnosis not present

## 2017-05-24 DIAGNOSIS — I10 Essential (primary) hypertension: Secondary | ICD-10-CM | POA: Diagnosis not present

## 2017-06-22 DIAGNOSIS — J449 Chronic obstructive pulmonary disease, unspecified: Secondary | ICD-10-CM | POA: Diagnosis not present

## 2017-06-22 DIAGNOSIS — I5023 Acute on chronic systolic (congestive) heart failure: Secondary | ICD-10-CM | POA: Diagnosis not present

## 2017-06-22 DIAGNOSIS — I10 Essential (primary) hypertension: Secondary | ICD-10-CM | POA: Diagnosis not present

## 2017-06-22 DIAGNOSIS — E118 Type 2 diabetes mellitus with unspecified complications: Secondary | ICD-10-CM | POA: Diagnosis not present

## 2017-07-11 DIAGNOSIS — Z79899 Other long term (current) drug therapy: Secondary | ICD-10-CM | POA: Diagnosis not present

## 2017-07-18 ENCOUNTER — Ambulatory Visit (INDEPENDENT_AMBULATORY_CARE_PROVIDER_SITE_OTHER): Payer: Medicare Other | Admitting: Internal Medicine

## 2017-07-18 ENCOUNTER — Encounter: Payer: Self-pay | Admitting: Internal Medicine

## 2017-07-18 VITALS — BP 104/64 | HR 73 | Ht 72.0 in | Wt 244.0 lb

## 2017-07-18 DIAGNOSIS — Z950 Presence of cardiac pacemaker: Secondary | ICD-10-CM | POA: Diagnosis not present

## 2017-07-18 DIAGNOSIS — I481 Persistent atrial fibrillation: Secondary | ICD-10-CM | POA: Diagnosis not present

## 2017-07-18 DIAGNOSIS — I4811 Longstanding persistent atrial fibrillation: Secondary | ICD-10-CM

## 2017-07-18 DIAGNOSIS — I5032 Chronic diastolic (congestive) heart failure: Secondary | ICD-10-CM

## 2017-07-18 LAB — CUP PACEART INCLINIC DEVICE CHECK
Battery Voltage: 2.74 V
Brady Statistic RV Percent Paced: 99.2 %
Date Time Interrogation Session: 20181001144738
Implantable Lead Location: 753859
Implantable Lead Location: 753860
Implantable Lead Model: 5034
Implantable Pulse Generator Implant Date: 20081211
Lead Channel Impedance Value: 1025 Ohm
Lead Channel Pacing Threshold Amplitude: 1 V
Lead Channel Pacing Threshold Pulse Width: 0.4 ms
Lead Channel Pacing Threshold Pulse Width: 0.76 ms
Lead Channel Setting Pacing Pulse Width: 0.4 ms
Lead Channel Setting Sensing Sensitivity: 2.8 mV
MDC IDC LEAD IMPLANT DT: 19960418
MDC IDC LEAD IMPLANT DT: 19960418
MDC IDC MSMT LEADCHNL RA IMPEDANCE VALUE: 855 Ohm
MDC IDC MSMT LEADCHNL RA PACING THRESHOLD AMPLITUDE: 0.5 V
MDC IDC SET LEADCHNL RA PACING AMPLITUDE: 2 V
MDC IDC SET LEADCHNL RV PACING AMPLITUDE: 2.5 V
MDC IDC STAT BRADY RA PERCENT PACED: 85.7 %

## 2017-07-18 NOTE — Patient Instructions (Signed)
Medication Instructions:  Your physician recommends that you continue on your current medications as directed. Please refer to the Current Medication list given to you today.   Labwork: NONE   Testing/Procedures: NONE   Follow-Up: Your physician wants you to follow-up in: 1 Year. You will receive a reminder letter in the mail two months in advance. If you don't receive a letter, please call our office to schedule the follow-up appointment.  Remote monitoring is used to monitor your Pacemaker of ICD from home. This monitoring reduces the number of office visits required to check your device to one time per year. It allows [place] to keep an eye on the functioning of your device to ensure it is working properly. You are scheduled for a device check from home on 10/17/17. You may send your transmission at any time that day. If you have a wireless device, the transmission will be sent automatically. After your physician reviews your transmission, you will receive a postcard with your next transmission date.    Any Other Special Instructions Will Be Listed Below (If Applicable).     If you need a refill on your cardiac medications before your next appointment, please call your pharmacy.  Thank you for choosing [facility]!

## 2017-07-18 NOTE — Progress Notes (Signed)
HPI Alan Briggs returns today for ongoing evaluation and management of symptomatic bradycardia secondary to complete heart block status post permanent pacemaker insertion. The patient also is a history of diastolic heart failure, hypertension, and diabetes. He has had a couple of hospital visits since I last saw him, he has been in the hospital with gastrointestinal bleeding, as well as dizziness. His sons who are with him today note that his energy level has gone down. He gets fatigued easily. He has lost 60 pounds. No syncope. He notes that his blood sugar has been increased. Allergies  Allergen Reactions  . Advair Diskus [Fluticasone-Salmeterol] Other (See Comments)    Reaction:  Unknown   . Combivent [Ipratropium-Albuterol] Other (See Comments)    Reaction:  Unknown   . Lotensin Hct [Benazepril-Hydrochlorothiazide] Other (See Comments)    Reaction:  Unknown   . Penicillins Swelling and Other (See Comments)    Reaction:  Facial swelling Has patient had a PCN reaction causing immediate rash, facial/tongue/throat swelling, SOB or lightheadedness with hypotension: Yes Has patient had a PCN reaction causing severe rash involving mucus membranes or skin necrosis: No Has patient had a PCN reaction that required hospitalization No Has patient had a PCN reaction occurring within the last 10 years: No If all of the above answers are "NO", then may proceed with Cephalosporin use.  . Simvastatin Other (See Comments)    Reaction:  Muscle pain      Current Outpatient Prescriptions  Medication Sig Dispense Refill  . acetaminophen (TYLENOL) 500 MG tablet Take 1,000 mg by mouth every 6 (six) hours as needed for mild pain, moderate pain or headache.     Marland Kitchen aspirin EC 81 MG tablet Take 81 mg by mouth daily.    Marland Kitchen atenolol (TENORMIN) 25 MG tablet Take 25 mg by mouth daily.     . DULoxetine (CYMBALTA) 30 MG capsule Take 30 mg by mouth daily.    . furosemide (LASIX) 20 MG tablet Take 1 tablet (20 mg  total) by mouth daily.    . insulin glargine (LANTUS) 100 UNIT/ML injection Inject 0.1 mLs (10 Units total) into the skin at bedtime. (Patient taking differently: Inject 12 Units into the skin at bedtime. ) 10 mL 11  . meclizine (ANTIVERT) 25 MG tablet Take 1 tablet (25 mg total) by mouth 3 (three) times daily as needed for dizziness. 30 tablet 0  . Multiple Vitamin (MULTIVITAMIN WITH MINERALS) TABS tablet Take 1 tablet by mouth daily.    . Multiple Vitamins-Minerals (ICAPS AREDS 2) CAPS Take 1 capsule by mouth daily.    . polyethylene glycol (MIRALAX / GLYCOLAX) packet Take 17 g by mouth daily as needed for mild constipation. 14 each 0  . traMADol (ULTRAM) 50 MG tablet Take 1 tablet (50 mg total) by mouth every 12 (twelve) hours as needed for moderate pain (or Headache unrelieved by tylenol). 10 tablet 0   No current facility-administered medications for this visit.      Past Medical History:  Diagnosis Date  . Cardiac pacemaker in situ    for sick sinus syndrome-last placement was 09/2007 [facility]  . Diabetes [facility])   . GERD (gastroesophageal reflux disease)   . Mixed hyperlipidemia   . Obstructive lung disease (HCC)    non compliant with home O2  . Pulmonary hypertension (HCC)   . Sleep apnea   . Thrombocytopenia (HCC)   . Venous insufficiency (chronic) (peripheral)     ROS:   All systems  reviewed and negative except as noted in the HPI.   Past Surgical History:  Procedure Laterality Date  . CATARACT EXTRACTION, BILATERAL Bilateral   . COLONOSCOPY N/A 04/24/2017   Procedure: COLONOSCOPY;  Surgeon: Corbin Ade, MD;  [facility];  Service: Endoscopy;  Laterality: N/A;  . ESOPHAGOGASTRODUODENOSCOPY N/A 04/24/2017   Procedure: ESOPHAGOGASTRODUODENOSCOPY (EGD);  Surgeon: Corbin Ade, MD;  [facility];  Service: Endoscopy;  Laterality: N/A;  . KIDNEY STONE SURGERY Left    laser ablation   . PACEMAKER IMPLANT    . PERCUTANEOUS PINNING Right  04/20/2016   Procedure: IRRIGATION AND DEBRIDEMENT RIGHT FOOT, PERCUTANEOUS PINNING SMALL TOE;  Surgeon: Tarry Kos, MD;  Location: [facility] OR;  Service: Orthopedics;  Laterality: Right;  . REFRACTIVE SURGERY Right   . ROTATOR CUFF REPAIR Left      Family History  Problem Relation Age of Onset  . Diabetes Mother   . Colon cancer Neg Hx   . Colon polyps Neg Hx      Social History   Social History  . Marital status: Married    Spouse name: N/A  . Number of children: N/A  . Years of education: N/A   Occupational History  . Not on file.   Social History Main Topics  . Smoking status: Former Smoker    Packs/day: 0.50    Years: 30.00    Types: Cigarettes    Start date: 10/18/1948    Quit date: 10/18/1978  . Smokeless tobacco: Never Used  . Alcohol use No  . Drug use: No  . Sexual activity: No   Other Topics Concern  . Not on file   Social History Narrative  . No narrative on file     BP 104/64 (BP Location: Right Arm)   Pulse 73   Ht 6' (1.829 m)   Wt 244 lb (110.7 kg)   SpO2 92%   BMI 33.09 kg/m   Physical Exam:  Elderly appearing 83 year old man, NAD HEENT: Unremarkable Neck:  7 cm JVD, no thyromegally Lymphatics:  No adenopathy Back:  No CVA tenderness Lungs:  Clear, with only basilar rales, but no wheezes or rhonchi HEART:  Regular rate rhythm, no murmurs, no rubs, no clicks Abd:  soft, positive bowel sounds, no organomegally, no rebound, no guarding Ext:  2 plus pulses,  Trace peripheral edema, no cyanosis, no clubbing Skin:  No rashes no nodules Neuro:  CN II through XII intact, motor grossly intact   DEVICE  Normal device function.  See PaceArt for details.   Assess/Plan: 1. Chronic atrial fibrillation - his ventricular rate is well controlled. Because of falls and bleeding, his anticoagulant has been discontinued, appropriately so. 2. Complete heart block - he is doing well status post pacemaker insertion 3. Pacemaker - his Medtronic dual-chamber  pacemaker is working normally. He has approximately 2 years of battery longevity. Today we reprogrammed his device hoping to improve longevity. 4. Chronic diastolic heart failure - the patient has had class II symptoms. I encouraged him to reduce his salt intake and to increase his physical activity as tolerated.  Lewayne Bunting, M.D.

## 2017-07-19 ENCOUNTER — Encounter: Payer: Medicare Other | Admitting: *Deleted

## 2017-07-19 ENCOUNTER — Telehealth: Payer: Self-pay | Admitting: Cardiology

## 2017-07-19 DIAGNOSIS — E118 Type 2 diabetes mellitus with unspecified complications: Secondary | ICD-10-CM | POA: Diagnosis not present

## 2017-07-19 DIAGNOSIS — J449 Chronic obstructive pulmonary disease, unspecified: Secondary | ICD-10-CM | POA: Diagnosis not present

## 2017-07-19 DIAGNOSIS — I5032 Chronic diastolic (congestive) heart failure: Secondary | ICD-10-CM | POA: Diagnosis not present

## 2017-07-19 DIAGNOSIS — I10 Essential (primary) hypertension: Secondary | ICD-10-CM | POA: Diagnosis not present

## 2017-07-19 NOTE — Telephone Encounter (Signed)
Attempted to confirm remote transmission with pt. No answer and was unable to leave a message.   

## 2017-07-20 ENCOUNTER — Encounter: Payer: Self-pay | Admitting: Cardiology

## 2017-07-22 DIAGNOSIS — J449 Chronic obstructive pulmonary disease, unspecified: Secondary | ICD-10-CM | POA: Diagnosis not present

## 2017-07-22 DIAGNOSIS — K922 Gastrointestinal hemorrhage, unspecified: Secondary | ICD-10-CM | POA: Diagnosis not present

## 2017-07-22 DIAGNOSIS — E118 Type 2 diabetes mellitus with unspecified complications: Secondary | ICD-10-CM | POA: Diagnosis not present

## 2017-07-22 DIAGNOSIS — M48061 Spinal stenosis, lumbar region without neurogenic claudication: Secondary | ICD-10-CM | POA: Diagnosis not present

## 2017-07-26 DIAGNOSIS — G4733 Obstructive sleep apnea (adult) (pediatric): Secondary | ICD-10-CM | POA: Diagnosis not present

## 2017-07-26 DIAGNOSIS — I5030 Unspecified diastolic (congestive) heart failure: Secondary | ICD-10-CM | POA: Diagnosis not present

## 2017-07-26 DIAGNOSIS — S42202D Unspecified fracture of upper end of left humerus, subsequent encounter for fracture with routine healing: Secondary | ICD-10-CM | POA: Diagnosis not present

## 2017-07-26 DIAGNOSIS — I110 Hypertensive heart disease with heart failure: Secondary | ICD-10-CM | POA: Diagnosis not present

## 2017-07-26 DIAGNOSIS — Z794 Long term (current) use of insulin: Secondary | ICD-10-CM | POA: Diagnosis not present

## 2017-07-26 DIAGNOSIS — E119 Type 2 diabetes mellitus without complications: Secondary | ICD-10-CM | POA: Diagnosis not present

## 2017-07-26 DIAGNOSIS — D530 Protein deficiency anemia: Secondary | ICD-10-CM | POA: Diagnosis not present

## 2017-07-26 DIAGNOSIS — Z5181 Encounter for therapeutic drug level monitoring: Secondary | ICD-10-CM | POA: Diagnosis not present

## 2017-07-26 DIAGNOSIS — F17211 Nicotine dependence, cigarettes, in remission: Secondary | ICD-10-CM | POA: Diagnosis not present

## 2017-07-26 DIAGNOSIS — J449 Chronic obstructive pulmonary disease, unspecified: Secondary | ICD-10-CM | POA: Diagnosis not present

## 2017-07-26 DIAGNOSIS — M48061 Spinal stenosis, lumbar region without neurogenic claudication: Secondary | ICD-10-CM | POA: Diagnosis not present

## 2017-07-26 DIAGNOSIS — D6489 Other specified anemias: Secondary | ICD-10-CM | POA: Diagnosis not present

## 2017-07-26 DIAGNOSIS — Z950 Presence of cardiac pacemaker: Secondary | ICD-10-CM | POA: Diagnosis not present

## 2017-07-26 DIAGNOSIS — M6281 Muscle weakness (generalized): Secondary | ICD-10-CM | POA: Diagnosis not present

## 2017-07-27 DIAGNOSIS — E119 Type 2 diabetes mellitus without complications: Secondary | ICD-10-CM | POA: Diagnosis not present

## 2017-07-27 DIAGNOSIS — J449 Chronic obstructive pulmonary disease, unspecified: Secondary | ICD-10-CM | POA: Diagnosis not present

## 2017-07-27 DIAGNOSIS — S42202D Unspecified fracture of upper end of left humerus, subsequent encounter for fracture with routine healing: Secondary | ICD-10-CM | POA: Diagnosis not present

## 2017-07-27 DIAGNOSIS — I110 Hypertensive heart disease with heart failure: Secondary | ICD-10-CM | POA: Diagnosis not present

## 2017-07-27 DIAGNOSIS — I5030 Unspecified diastolic (congestive) heart failure: Secondary | ICD-10-CM | POA: Diagnosis not present

## 2017-07-27 DIAGNOSIS — M6281 Muscle weakness (generalized): Secondary | ICD-10-CM | POA: Diagnosis not present

## 2017-07-28 DIAGNOSIS — I5030 Unspecified diastolic (congestive) heart failure: Secondary | ICD-10-CM | POA: Diagnosis not present

## 2017-07-28 DIAGNOSIS — I110 Hypertensive heart disease with heart failure: Secondary | ICD-10-CM | POA: Diagnosis not present

## 2017-07-28 DIAGNOSIS — J449 Chronic obstructive pulmonary disease, unspecified: Secondary | ICD-10-CM | POA: Diagnosis not present

## 2017-07-28 DIAGNOSIS — M6281 Muscle weakness (generalized): Secondary | ICD-10-CM | POA: Diagnosis not present

## 2017-07-28 DIAGNOSIS — S42202D Unspecified fracture of upper end of left humerus, subsequent encounter for fracture with routine healing: Secondary | ICD-10-CM | POA: Diagnosis not present

## 2017-07-28 DIAGNOSIS — E119 Type 2 diabetes mellitus without complications: Secondary | ICD-10-CM | POA: Diagnosis not present

## 2017-08-01 DIAGNOSIS — I110 Hypertensive heart disease with heart failure: Secondary | ICD-10-CM | POA: Diagnosis not present

## 2017-08-01 DIAGNOSIS — M6281 Muscle weakness (generalized): Secondary | ICD-10-CM | POA: Diagnosis not present

## 2017-08-01 DIAGNOSIS — S42202D Unspecified fracture of upper end of left humerus, subsequent encounter for fracture with routine healing: Secondary | ICD-10-CM | POA: Diagnosis not present

## 2017-08-01 DIAGNOSIS — E119 Type 2 diabetes mellitus without complications: Secondary | ICD-10-CM | POA: Diagnosis not present

## 2017-08-01 DIAGNOSIS — I5030 Unspecified diastolic (congestive) heart failure: Secondary | ICD-10-CM | POA: Diagnosis not present

## 2017-08-01 DIAGNOSIS — J449 Chronic obstructive pulmonary disease, unspecified: Secondary | ICD-10-CM | POA: Diagnosis not present

## 2017-08-02 DIAGNOSIS — I110 Hypertensive heart disease with heart failure: Secondary | ICD-10-CM | POA: Diagnosis not present

## 2017-08-02 DIAGNOSIS — E119 Type 2 diabetes mellitus without complications: Secondary | ICD-10-CM | POA: Diagnosis not present

## 2017-08-02 DIAGNOSIS — S42202D Unspecified fracture of upper end of left humerus, subsequent encounter for fracture with routine healing: Secondary | ICD-10-CM | POA: Diagnosis not present

## 2017-08-02 DIAGNOSIS — M6281 Muscle weakness (generalized): Secondary | ICD-10-CM | POA: Diagnosis not present

## 2017-08-02 DIAGNOSIS — I5030 Unspecified diastolic (congestive) heart failure: Secondary | ICD-10-CM | POA: Diagnosis not present

## 2017-08-02 DIAGNOSIS — J449 Chronic obstructive pulmonary disease, unspecified: Secondary | ICD-10-CM | POA: Diagnosis not present

## 2017-08-03 DIAGNOSIS — I5030 Unspecified diastolic (congestive) heart failure: Secondary | ICD-10-CM | POA: Diagnosis not present

## 2017-08-03 DIAGNOSIS — S42202D Unspecified fracture of upper end of left humerus, subsequent encounter for fracture with routine healing: Secondary | ICD-10-CM | POA: Diagnosis not present

## 2017-08-03 DIAGNOSIS — M6281 Muscle weakness (generalized): Secondary | ICD-10-CM | POA: Diagnosis not present

## 2017-08-03 DIAGNOSIS — J449 Chronic obstructive pulmonary disease, unspecified: Secondary | ICD-10-CM | POA: Diagnosis not present

## 2017-08-03 DIAGNOSIS — I110 Hypertensive heart disease with heart failure: Secondary | ICD-10-CM | POA: Diagnosis not present

## 2017-08-03 DIAGNOSIS — E119 Type 2 diabetes mellitus without complications: Secondary | ICD-10-CM | POA: Diagnosis not present

## 2017-08-04 DIAGNOSIS — I110 Hypertensive heart disease with heart failure: Secondary | ICD-10-CM | POA: Diagnosis not present

## 2017-08-04 DIAGNOSIS — I5030 Unspecified diastolic (congestive) heart failure: Secondary | ICD-10-CM | POA: Diagnosis not present

## 2017-08-04 DIAGNOSIS — M6281 Muscle weakness (generalized): Secondary | ICD-10-CM | POA: Diagnosis not present

## 2017-08-04 DIAGNOSIS — E119 Type 2 diabetes mellitus without complications: Secondary | ICD-10-CM | POA: Diagnosis not present

## 2017-08-04 DIAGNOSIS — J449 Chronic obstructive pulmonary disease, unspecified: Secondary | ICD-10-CM | POA: Diagnosis not present

## 2017-08-04 DIAGNOSIS — S42202D Unspecified fracture of upper end of left humerus, subsequent encounter for fracture with routine healing: Secondary | ICD-10-CM | POA: Diagnosis not present

## 2017-08-05 DIAGNOSIS — M6281 Muscle weakness (generalized): Secondary | ICD-10-CM | POA: Diagnosis not present

## 2017-08-05 DIAGNOSIS — E119 Type 2 diabetes mellitus without complications: Secondary | ICD-10-CM | POA: Diagnosis not present

## 2017-08-05 DIAGNOSIS — J449 Chronic obstructive pulmonary disease, unspecified: Secondary | ICD-10-CM | POA: Diagnosis not present

## 2017-08-05 DIAGNOSIS — S42202D Unspecified fracture of upper end of left humerus, subsequent encounter for fracture with routine healing: Secondary | ICD-10-CM | POA: Diagnosis not present

## 2017-08-05 DIAGNOSIS — I110 Hypertensive heart disease with heart failure: Secondary | ICD-10-CM | POA: Diagnosis not present

## 2017-08-05 DIAGNOSIS — I5030 Unspecified diastolic (congestive) heart failure: Secondary | ICD-10-CM | POA: Diagnosis not present

## 2017-08-08 DIAGNOSIS — E1141 Type 2 diabetes mellitus with diabetic mononeuropathy: Secondary | ICD-10-CM | POA: Diagnosis not present

## 2017-08-08 DIAGNOSIS — I5030 Unspecified diastolic (congestive) heart failure: Secondary | ICD-10-CM | POA: Diagnosis not present

## 2017-08-08 DIAGNOSIS — M6281 Muscle weakness (generalized): Secondary | ICD-10-CM | POA: Diagnosis not present

## 2017-08-08 DIAGNOSIS — J449 Chronic obstructive pulmonary disease, unspecified: Secondary | ICD-10-CM | POA: Diagnosis not present

## 2017-08-08 DIAGNOSIS — E119 Type 2 diabetes mellitus without complications: Secondary | ICD-10-CM | POA: Diagnosis not present

## 2017-08-08 DIAGNOSIS — I10 Essential (primary) hypertension: Secondary | ICD-10-CM | POA: Diagnosis not present

## 2017-08-08 DIAGNOSIS — I110 Hypertensive heart disease with heart failure: Secondary | ICD-10-CM | POA: Diagnosis not present

## 2017-08-08 DIAGNOSIS — S42202D Unspecified fracture of upper end of left humerus, subsequent encounter for fracture with routine healing: Secondary | ICD-10-CM | POA: Diagnosis not present

## 2017-08-09 DIAGNOSIS — I110 Hypertensive heart disease with heart failure: Secondary | ICD-10-CM | POA: Diagnosis not present

## 2017-08-09 DIAGNOSIS — I5030 Unspecified diastolic (congestive) heart failure: Secondary | ICD-10-CM | POA: Diagnosis not present

## 2017-08-09 DIAGNOSIS — J449 Chronic obstructive pulmonary disease, unspecified: Secondary | ICD-10-CM | POA: Diagnosis not present

## 2017-08-09 DIAGNOSIS — S42202D Unspecified fracture of upper end of left humerus, subsequent encounter for fracture with routine healing: Secondary | ICD-10-CM | POA: Diagnosis not present

## 2017-08-09 DIAGNOSIS — E119 Type 2 diabetes mellitus without complications: Secondary | ICD-10-CM | POA: Diagnosis not present

## 2017-08-09 DIAGNOSIS — M6281 Muscle weakness (generalized): Secondary | ICD-10-CM | POA: Diagnosis not present

## 2017-08-10 DIAGNOSIS — G9009 Other idiopathic peripheral autonomic neuropathy: Secondary | ICD-10-CM | POA: Diagnosis not present

## 2017-08-10 DIAGNOSIS — E785 Hyperlipidemia, unspecified: Secondary | ICD-10-CM | POA: Diagnosis not present

## 2017-08-10 DIAGNOSIS — I10 Essential (primary) hypertension: Secondary | ICD-10-CM | POA: Diagnosis not present

## 2017-08-10 DIAGNOSIS — L97909 Non-pressure chronic ulcer of unspecified part of unspecified lower leg with unspecified severity: Secondary | ICD-10-CM | POA: Diagnosis not present

## 2017-08-10 DIAGNOSIS — E1140 Type 2 diabetes mellitus with diabetic neuropathy, unspecified: Secondary | ICD-10-CM | POA: Diagnosis not present

## 2017-08-10 DIAGNOSIS — D696 Thrombocytopenia, unspecified: Secondary | ICD-10-CM | POA: Diagnosis not present

## 2017-08-10 DIAGNOSIS — Z950 Presence of cardiac pacemaker: Secondary | ICD-10-CM | POA: Diagnosis not present

## 2017-08-11 DIAGNOSIS — J449 Chronic obstructive pulmonary disease, unspecified: Secondary | ICD-10-CM | POA: Diagnosis not present

## 2017-08-11 DIAGNOSIS — S42202D Unspecified fracture of upper end of left humerus, subsequent encounter for fracture with routine healing: Secondary | ICD-10-CM | POA: Diagnosis not present

## 2017-08-11 DIAGNOSIS — M6281 Muscle weakness (generalized): Secondary | ICD-10-CM | POA: Diagnosis not present

## 2017-08-11 DIAGNOSIS — I110 Hypertensive heart disease with heart failure: Secondary | ICD-10-CM | POA: Diagnosis not present

## 2017-08-11 DIAGNOSIS — E119 Type 2 diabetes mellitus without complications: Secondary | ICD-10-CM | POA: Diagnosis not present

## 2017-08-11 DIAGNOSIS — I5030 Unspecified diastolic (congestive) heart failure: Secondary | ICD-10-CM | POA: Diagnosis not present

## 2017-08-12 DIAGNOSIS — M6281 Muscle weakness (generalized): Secondary | ICD-10-CM | POA: Diagnosis not present

## 2017-08-12 DIAGNOSIS — J449 Chronic obstructive pulmonary disease, unspecified: Secondary | ICD-10-CM | POA: Diagnosis not present

## 2017-08-12 DIAGNOSIS — I5030 Unspecified diastolic (congestive) heart failure: Secondary | ICD-10-CM | POA: Diagnosis not present

## 2017-08-12 DIAGNOSIS — E119 Type 2 diabetes mellitus without complications: Secondary | ICD-10-CM | POA: Diagnosis not present

## 2017-08-12 DIAGNOSIS — S42202D Unspecified fracture of upper end of left humerus, subsequent encounter for fracture with routine healing: Secondary | ICD-10-CM | POA: Diagnosis not present

## 2017-08-12 DIAGNOSIS — I110 Hypertensive heart disease with heart failure: Secondary | ICD-10-CM | POA: Diagnosis not present

## 2017-08-15 DIAGNOSIS — M6281 Muscle weakness (generalized): Secondary | ICD-10-CM | POA: Diagnosis not present

## 2017-08-15 DIAGNOSIS — J449 Chronic obstructive pulmonary disease, unspecified: Secondary | ICD-10-CM | POA: Diagnosis not present

## 2017-08-15 DIAGNOSIS — I110 Hypertensive heart disease with heart failure: Secondary | ICD-10-CM | POA: Diagnosis not present

## 2017-08-15 DIAGNOSIS — S42202D Unspecified fracture of upper end of left humerus, subsequent encounter for fracture with routine healing: Secondary | ICD-10-CM | POA: Diagnosis not present

## 2017-08-15 DIAGNOSIS — E119 Type 2 diabetes mellitus without complications: Secondary | ICD-10-CM | POA: Diagnosis not present

## 2017-08-15 DIAGNOSIS — I5030 Unspecified diastolic (congestive) heart failure: Secondary | ICD-10-CM | POA: Diagnosis not present

## 2017-08-16 DIAGNOSIS — S42202D Unspecified fracture of upper end of left humerus, subsequent encounter for fracture with routine healing: Secondary | ICD-10-CM | POA: Diagnosis not present

## 2017-08-16 DIAGNOSIS — I5030 Unspecified diastolic (congestive) heart failure: Secondary | ICD-10-CM | POA: Diagnosis not present

## 2017-08-16 DIAGNOSIS — M6281 Muscle weakness (generalized): Secondary | ICD-10-CM | POA: Diagnosis not present

## 2017-08-16 DIAGNOSIS — J449 Chronic obstructive pulmonary disease, unspecified: Secondary | ICD-10-CM | POA: Diagnosis not present

## 2017-08-16 DIAGNOSIS — I110 Hypertensive heart disease with heart failure: Secondary | ICD-10-CM | POA: Diagnosis not present

## 2017-08-16 DIAGNOSIS — E119 Type 2 diabetes mellitus without complications: Secondary | ICD-10-CM | POA: Diagnosis not present

## 2017-08-18 DIAGNOSIS — S42202D Unspecified fracture of upper end of left humerus, subsequent encounter for fracture with routine healing: Secondary | ICD-10-CM | POA: Diagnosis not present

## 2017-08-18 DIAGNOSIS — E119 Type 2 diabetes mellitus without complications: Secondary | ICD-10-CM | POA: Diagnosis not present

## 2017-08-18 DIAGNOSIS — M6281 Muscle weakness (generalized): Secondary | ICD-10-CM | POA: Diagnosis not present

## 2017-08-18 DIAGNOSIS — I110 Hypertensive heart disease with heart failure: Secondary | ICD-10-CM | POA: Diagnosis not present

## 2017-08-18 DIAGNOSIS — J449 Chronic obstructive pulmonary disease, unspecified: Secondary | ICD-10-CM | POA: Diagnosis not present

## 2017-08-18 DIAGNOSIS — I5030 Unspecified diastolic (congestive) heart failure: Secondary | ICD-10-CM | POA: Diagnosis not present

## 2017-08-19 DIAGNOSIS — I110 Hypertensive heart disease with heart failure: Secondary | ICD-10-CM | POA: Diagnosis not present

## 2017-08-19 DIAGNOSIS — J449 Chronic obstructive pulmonary disease, unspecified: Secondary | ICD-10-CM | POA: Diagnosis not present

## 2017-08-19 DIAGNOSIS — S42202D Unspecified fracture of upper end of left humerus, subsequent encounter for fracture with routine healing: Secondary | ICD-10-CM | POA: Diagnosis not present

## 2017-08-19 DIAGNOSIS — M6281 Muscle weakness (generalized): Secondary | ICD-10-CM | POA: Diagnosis not present

## 2017-08-19 DIAGNOSIS — I5030 Unspecified diastolic (congestive) heart failure: Secondary | ICD-10-CM | POA: Diagnosis not present

## 2017-08-19 DIAGNOSIS — E119 Type 2 diabetes mellitus without complications: Secondary | ICD-10-CM | POA: Diagnosis not present

## 2017-08-22 DIAGNOSIS — I5030 Unspecified diastolic (congestive) heart failure: Secondary | ICD-10-CM | POA: Diagnosis not present

## 2017-08-22 DIAGNOSIS — I110 Hypertensive heart disease with heart failure: Secondary | ICD-10-CM | POA: Diagnosis not present

## 2017-08-22 DIAGNOSIS — M6281 Muscle weakness (generalized): Secondary | ICD-10-CM | POA: Diagnosis not present

## 2017-08-22 DIAGNOSIS — J449 Chronic obstructive pulmonary disease, unspecified: Secondary | ICD-10-CM | POA: Diagnosis not present

## 2017-08-22 DIAGNOSIS — E119 Type 2 diabetes mellitus without complications: Secondary | ICD-10-CM | POA: Diagnosis not present

## 2017-08-22 DIAGNOSIS — S42202D Unspecified fracture of upper end of left humerus, subsequent encounter for fracture with routine healing: Secondary | ICD-10-CM | POA: Diagnosis not present

## 2017-08-24 DIAGNOSIS — E119 Type 2 diabetes mellitus without complications: Secondary | ICD-10-CM | POA: Diagnosis not present

## 2017-08-24 DIAGNOSIS — M6281 Muscle weakness (generalized): Secondary | ICD-10-CM | POA: Diagnosis not present

## 2017-08-24 DIAGNOSIS — I5030 Unspecified diastolic (congestive) heart failure: Secondary | ICD-10-CM | POA: Diagnosis not present

## 2017-08-24 DIAGNOSIS — J449 Chronic obstructive pulmonary disease, unspecified: Secondary | ICD-10-CM | POA: Diagnosis not present

## 2017-08-24 DIAGNOSIS — I110 Hypertensive heart disease with heart failure: Secondary | ICD-10-CM | POA: Diagnosis not present

## 2017-08-24 DIAGNOSIS — S42202D Unspecified fracture of upper end of left humerus, subsequent encounter for fracture with routine healing: Secondary | ICD-10-CM | POA: Diagnosis not present

## 2017-08-25 DIAGNOSIS — I110 Hypertensive heart disease with heart failure: Secondary | ICD-10-CM | POA: Diagnosis not present

## 2017-08-25 DIAGNOSIS — M6281 Muscle weakness (generalized): Secondary | ICD-10-CM | POA: Diagnosis not present

## 2017-08-25 DIAGNOSIS — S42202D Unspecified fracture of upper end of left humerus, subsequent encounter for fracture with routine healing: Secondary | ICD-10-CM | POA: Diagnosis not present

## 2017-08-25 DIAGNOSIS — E119 Type 2 diabetes mellitus without complications: Secondary | ICD-10-CM | POA: Diagnosis not present

## 2017-08-25 DIAGNOSIS — J449 Chronic obstructive pulmonary disease, unspecified: Secondary | ICD-10-CM | POA: Diagnosis not present

## 2017-08-25 DIAGNOSIS — I5030 Unspecified diastolic (congestive) heart failure: Secondary | ICD-10-CM | POA: Diagnosis not present

## 2017-08-26 DIAGNOSIS — E119 Type 2 diabetes mellitus without complications: Secondary | ICD-10-CM | POA: Diagnosis not present

## 2017-08-26 DIAGNOSIS — M6281 Muscle weakness (generalized): Secondary | ICD-10-CM | POA: Diagnosis not present

## 2017-08-26 DIAGNOSIS — S42202D Unspecified fracture of upper end of left humerus, subsequent encounter for fracture with routine healing: Secondary | ICD-10-CM | POA: Diagnosis not present

## 2017-08-26 DIAGNOSIS — I5030 Unspecified diastolic (congestive) heart failure: Secondary | ICD-10-CM | POA: Diagnosis not present

## 2017-08-26 DIAGNOSIS — I110 Hypertensive heart disease with heart failure: Secondary | ICD-10-CM | POA: Diagnosis not present

## 2017-08-26 DIAGNOSIS — J449 Chronic obstructive pulmonary disease, unspecified: Secondary | ICD-10-CM | POA: Diagnosis not present

## 2017-08-29 DIAGNOSIS — I110 Hypertensive heart disease with heart failure: Secondary | ICD-10-CM | POA: Diagnosis not present

## 2017-08-29 DIAGNOSIS — J449 Chronic obstructive pulmonary disease, unspecified: Secondary | ICD-10-CM | POA: Diagnosis not present

## 2017-08-29 DIAGNOSIS — E119 Type 2 diabetes mellitus without complications: Secondary | ICD-10-CM | POA: Diagnosis not present

## 2017-08-29 DIAGNOSIS — M6281 Muscle weakness (generalized): Secondary | ICD-10-CM | POA: Diagnosis not present

## 2017-08-29 DIAGNOSIS — I5030 Unspecified diastolic (congestive) heart failure: Secondary | ICD-10-CM | POA: Diagnosis not present

## 2017-08-29 DIAGNOSIS — S42202D Unspecified fracture of upper end of left humerus, subsequent encounter for fracture with routine healing: Secondary | ICD-10-CM | POA: Diagnosis not present

## 2017-08-30 ENCOUNTER — Other Ambulatory Visit: Payer: Self-pay | Admitting: Cardiology

## 2017-08-30 MED ORDER — FUROSEMIDE 20 MG PO TABS: 20.0000 mg | ORAL_TABLET | Freq: Every day | ORAL | 6 refills | Status: DC

## 2017-08-30 NOTE — Telephone Encounter (Addendum)
Son notified overdue for follow up.  RX refill sent to pharm as requested.  OV scheduled for 11/01/2017 with Dr. Wyline MoodBranch.

## 2017-08-30 NOTE — Telephone Encounter (Signed)
Needs refill on furosemide (LASIX) 20 MG tablet   [facility].

## 2017-08-31 DIAGNOSIS — E119 Type 2 diabetes mellitus without complications: Secondary | ICD-10-CM | POA: Diagnosis not present

## 2017-08-31 DIAGNOSIS — J449 Chronic obstructive pulmonary disease, unspecified: Secondary | ICD-10-CM | POA: Diagnosis not present

## 2017-08-31 DIAGNOSIS — I110 Hypertensive heart disease with heart failure: Secondary | ICD-10-CM | POA: Diagnosis not present

## 2017-08-31 DIAGNOSIS — I5030 Unspecified diastolic (congestive) heart failure: Secondary | ICD-10-CM | POA: Diagnosis not present

## 2017-08-31 DIAGNOSIS — M6281 Muscle weakness (generalized): Secondary | ICD-10-CM | POA: Diagnosis not present

## 2017-08-31 DIAGNOSIS — S42202D Unspecified fracture of upper end of left humerus, subsequent encounter for fracture with routine healing: Secondary | ICD-10-CM | POA: Diagnosis not present

## 2017-09-01 DIAGNOSIS — I110 Hypertensive heart disease with heart failure: Secondary | ICD-10-CM | POA: Diagnosis not present

## 2017-09-01 DIAGNOSIS — M6281 Muscle weakness (generalized): Secondary | ICD-10-CM | POA: Diagnosis not present

## 2017-09-01 DIAGNOSIS — S42202D Unspecified fracture of upper end of left humerus, subsequent encounter for fracture with routine healing: Secondary | ICD-10-CM | POA: Diagnosis not present

## 2017-09-01 DIAGNOSIS — J449 Chronic obstructive pulmonary disease, unspecified: Secondary | ICD-10-CM | POA: Diagnosis not present

## 2017-09-01 DIAGNOSIS — I5030 Unspecified diastolic (congestive) heart failure: Secondary | ICD-10-CM | POA: Diagnosis not present

## 2017-09-01 DIAGNOSIS — E119 Type 2 diabetes mellitus without complications: Secondary | ICD-10-CM | POA: Diagnosis not present

## 2017-09-05 DIAGNOSIS — J449 Chronic obstructive pulmonary disease, unspecified: Secondary | ICD-10-CM | POA: Diagnosis not present

## 2017-09-05 DIAGNOSIS — M6281 Muscle weakness (generalized): Secondary | ICD-10-CM | POA: Diagnosis not present

## 2017-09-05 DIAGNOSIS — S42202D Unspecified fracture of upper end of left humerus, subsequent encounter for fracture with routine healing: Secondary | ICD-10-CM | POA: Diagnosis not present

## 2017-09-05 DIAGNOSIS — I110 Hypertensive heart disease with heart failure: Secondary | ICD-10-CM | POA: Diagnosis not present

## 2017-09-05 DIAGNOSIS — E119 Type 2 diabetes mellitus without complications: Secondary | ICD-10-CM | POA: Diagnosis not present

## 2017-09-05 DIAGNOSIS — I5030 Unspecified diastolic (congestive) heart failure: Secondary | ICD-10-CM | POA: Diagnosis not present

## 2017-09-07 DIAGNOSIS — S42202D Unspecified fracture of upper end of left humerus, subsequent encounter for fracture with routine healing: Secondary | ICD-10-CM | POA: Diagnosis not present

## 2017-09-07 DIAGNOSIS — J449 Chronic obstructive pulmonary disease, unspecified: Secondary | ICD-10-CM | POA: Diagnosis not present

## 2017-09-07 DIAGNOSIS — I110 Hypertensive heart disease with heart failure: Secondary | ICD-10-CM | POA: Diagnosis not present

## 2017-09-07 DIAGNOSIS — I5030 Unspecified diastolic (congestive) heart failure: Secondary | ICD-10-CM | POA: Diagnosis not present

## 2017-09-07 DIAGNOSIS — M6281 Muscle weakness (generalized): Secondary | ICD-10-CM | POA: Diagnosis not present

## 2017-09-07 DIAGNOSIS — E119 Type 2 diabetes mellitus without complications: Secondary | ICD-10-CM | POA: Diagnosis not present

## 2017-09-09 DIAGNOSIS — M6281 Muscle weakness (generalized): Secondary | ICD-10-CM | POA: Diagnosis not present

## 2017-09-09 DIAGNOSIS — J449 Chronic obstructive pulmonary disease, unspecified: Secondary | ICD-10-CM | POA: Diagnosis not present

## 2017-09-09 DIAGNOSIS — I5030 Unspecified diastolic (congestive) heart failure: Secondary | ICD-10-CM | POA: Diagnosis not present

## 2017-09-09 DIAGNOSIS — E119 Type 2 diabetes mellitus without complications: Secondary | ICD-10-CM | POA: Diagnosis not present

## 2017-09-09 DIAGNOSIS — I110 Hypertensive heart disease with heart failure: Secondary | ICD-10-CM | POA: Diagnosis not present

## 2017-09-09 DIAGNOSIS — S42202D Unspecified fracture of upper end of left humerus, subsequent encounter for fracture with routine healing: Secondary | ICD-10-CM | POA: Diagnosis not present

## 2017-09-12 DIAGNOSIS — I5030 Unspecified diastolic (congestive) heart failure: Secondary | ICD-10-CM | POA: Diagnosis not present

## 2017-09-12 DIAGNOSIS — I110 Hypertensive heart disease with heart failure: Secondary | ICD-10-CM | POA: Diagnosis not present

## 2017-09-12 DIAGNOSIS — E119 Type 2 diabetes mellitus without complications: Secondary | ICD-10-CM | POA: Diagnosis not present

## 2017-09-12 DIAGNOSIS — M6281 Muscle weakness (generalized): Secondary | ICD-10-CM | POA: Diagnosis not present

## 2017-09-12 DIAGNOSIS — J449 Chronic obstructive pulmonary disease, unspecified: Secondary | ICD-10-CM | POA: Diagnosis not present

## 2017-09-12 DIAGNOSIS — S42202D Unspecified fracture of upper end of left humerus, subsequent encounter for fracture with routine healing: Secondary | ICD-10-CM | POA: Diagnosis not present

## 2017-09-13 DIAGNOSIS — S42202D Unspecified fracture of upper end of left humerus, subsequent encounter for fracture with routine healing: Secondary | ICD-10-CM | POA: Diagnosis not present

## 2017-09-13 DIAGNOSIS — J449 Chronic obstructive pulmonary disease, unspecified: Secondary | ICD-10-CM | POA: Diagnosis not present

## 2017-09-13 DIAGNOSIS — I110 Hypertensive heart disease with heart failure: Secondary | ICD-10-CM | POA: Diagnosis not present

## 2017-09-13 DIAGNOSIS — M6281 Muscle weakness (generalized): Secondary | ICD-10-CM | POA: Diagnosis not present

## 2017-09-13 DIAGNOSIS — I5030 Unspecified diastolic (congestive) heart failure: Secondary | ICD-10-CM | POA: Diagnosis not present

## 2017-09-13 DIAGNOSIS — E119 Type 2 diabetes mellitus without complications: Secondary | ICD-10-CM | POA: Diagnosis not present

## 2017-09-14 DIAGNOSIS — I5030 Unspecified diastolic (congestive) heart failure: Secondary | ICD-10-CM | POA: Diagnosis not present

## 2017-09-14 DIAGNOSIS — I110 Hypertensive heart disease with heart failure: Secondary | ICD-10-CM | POA: Diagnosis not present

## 2017-09-14 DIAGNOSIS — E119 Type 2 diabetes mellitus without complications: Secondary | ICD-10-CM | POA: Diagnosis not present

## 2017-09-14 DIAGNOSIS — J449 Chronic obstructive pulmonary disease, unspecified: Secondary | ICD-10-CM | POA: Diagnosis not present

## 2017-09-14 DIAGNOSIS — S42202D Unspecified fracture of upper end of left humerus, subsequent encounter for fracture with routine healing: Secondary | ICD-10-CM | POA: Diagnosis not present

## 2017-09-14 DIAGNOSIS — M6281 Muscle weakness (generalized): Secondary | ICD-10-CM | POA: Diagnosis not present

## 2017-09-19 DIAGNOSIS — I110 Hypertensive heart disease with heart failure: Secondary | ICD-10-CM | POA: Diagnosis not present

## 2017-09-19 DIAGNOSIS — S42202D Unspecified fracture of upper end of left humerus, subsequent encounter for fracture with routine healing: Secondary | ICD-10-CM | POA: Diagnosis not present

## 2017-09-19 DIAGNOSIS — I5030 Unspecified diastolic (congestive) heart failure: Secondary | ICD-10-CM | POA: Diagnosis not present

## 2017-09-19 DIAGNOSIS — M6281 Muscle weakness (generalized): Secondary | ICD-10-CM | POA: Diagnosis not present

## 2017-09-19 DIAGNOSIS — J449 Chronic obstructive pulmonary disease, unspecified: Secondary | ICD-10-CM | POA: Diagnosis not present

## 2017-09-19 DIAGNOSIS — E119 Type 2 diabetes mellitus without complications: Secondary | ICD-10-CM | POA: Diagnosis not present

## 2017-09-21 DIAGNOSIS — I5030 Unspecified diastolic (congestive) heart failure: Secondary | ICD-10-CM | POA: Diagnosis not present

## 2017-09-21 DIAGNOSIS — E119 Type 2 diabetes mellitus without complications: Secondary | ICD-10-CM | POA: Diagnosis not present

## 2017-09-21 DIAGNOSIS — I110 Hypertensive heart disease with heart failure: Secondary | ICD-10-CM | POA: Diagnosis not present

## 2017-09-21 DIAGNOSIS — S42202D Unspecified fracture of upper end of left humerus, subsequent encounter for fracture with routine healing: Secondary | ICD-10-CM | POA: Diagnosis not present

## 2017-09-21 DIAGNOSIS — M6281 Muscle weakness (generalized): Secondary | ICD-10-CM | POA: Diagnosis not present

## 2017-09-21 DIAGNOSIS — J449 Chronic obstructive pulmonary disease, unspecified: Secondary | ICD-10-CM | POA: Diagnosis not present

## 2017-09-23 DIAGNOSIS — I110 Hypertensive heart disease with heart failure: Secondary | ICD-10-CM | POA: Diagnosis not present

## 2017-09-23 DIAGNOSIS — E119 Type 2 diabetes mellitus without complications: Secondary | ICD-10-CM | POA: Diagnosis not present

## 2017-09-23 DIAGNOSIS — M6281 Muscle weakness (generalized): Secondary | ICD-10-CM | POA: Diagnosis not present

## 2017-09-23 DIAGNOSIS — T1490XA Injury, unspecified, initial encounter: Secondary | ICD-10-CM | POA: Diagnosis not present

## 2017-09-23 DIAGNOSIS — J449 Chronic obstructive pulmonary disease, unspecified: Secondary | ICD-10-CM | POA: Diagnosis not present

## 2017-09-23 DIAGNOSIS — S42202D Unspecified fracture of upper end of left humerus, subsequent encounter for fracture with routine healing: Secondary | ICD-10-CM | POA: Diagnosis not present

## 2017-09-23 DIAGNOSIS — I5030 Unspecified diastolic (congestive) heart failure: Secondary | ICD-10-CM | POA: Diagnosis not present

## 2017-09-24 DIAGNOSIS — Z5181 Encounter for therapeutic drug level monitoring: Secondary | ICD-10-CM | POA: Diagnosis not present

## 2017-09-24 DIAGNOSIS — E119 Type 2 diabetes mellitus without complications: Secondary | ICD-10-CM | POA: Diagnosis not present

## 2017-09-24 DIAGNOSIS — M48061 Spinal stenosis, lumbar region without neurogenic claudication: Secondary | ICD-10-CM | POA: Diagnosis not present

## 2017-09-24 DIAGNOSIS — I110 Hypertensive heart disease with heart failure: Secondary | ICD-10-CM | POA: Diagnosis not present

## 2017-09-24 DIAGNOSIS — D530 Protein deficiency anemia: Secondary | ICD-10-CM | POA: Diagnosis not present

## 2017-09-24 DIAGNOSIS — Z950 Presence of cardiac pacemaker: Secondary | ICD-10-CM | POA: Diagnosis not present

## 2017-09-24 DIAGNOSIS — I5030 Unspecified diastolic (congestive) heart failure: Secondary | ICD-10-CM | POA: Diagnosis not present

## 2017-09-24 DIAGNOSIS — Z794 Long term (current) use of insulin: Secondary | ICD-10-CM | POA: Diagnosis not present

## 2017-09-24 DIAGNOSIS — S42202D Unspecified fracture of upper end of left humerus, subsequent encounter for fracture with routine healing: Secondary | ICD-10-CM | POA: Diagnosis not present

## 2017-09-24 DIAGNOSIS — J449 Chronic obstructive pulmonary disease, unspecified: Secondary | ICD-10-CM | POA: Diagnosis not present

## 2017-09-24 DIAGNOSIS — F17211 Nicotine dependence, cigarettes, in remission: Secondary | ICD-10-CM | POA: Diagnosis not present

## 2017-09-24 DIAGNOSIS — M6281 Muscle weakness (generalized): Secondary | ICD-10-CM | POA: Diagnosis not present

## 2017-09-24 DIAGNOSIS — D6489 Other specified anemias: Secondary | ICD-10-CM | POA: Diagnosis not present

## 2017-09-24 DIAGNOSIS — G4733 Obstructive sleep apnea (adult) (pediatric): Secondary | ICD-10-CM | POA: Diagnosis not present

## 2017-09-27 DIAGNOSIS — I5030 Unspecified diastolic (congestive) heart failure: Secondary | ICD-10-CM | POA: Diagnosis not present

## 2017-09-27 DIAGNOSIS — E119 Type 2 diabetes mellitus without complications: Secondary | ICD-10-CM | POA: Diagnosis not present

## 2017-09-27 DIAGNOSIS — J449 Chronic obstructive pulmonary disease, unspecified: Secondary | ICD-10-CM | POA: Diagnosis not present

## 2017-09-27 DIAGNOSIS — M6281 Muscle weakness (generalized): Secondary | ICD-10-CM | POA: Diagnosis not present

## 2017-09-27 DIAGNOSIS — I110 Hypertensive heart disease with heart failure: Secondary | ICD-10-CM | POA: Diagnosis not present

## 2017-09-27 DIAGNOSIS — S42202D Unspecified fracture of upper end of left humerus, subsequent encounter for fracture with routine healing: Secondary | ICD-10-CM | POA: Diagnosis not present

## 2017-09-29 DIAGNOSIS — I110 Hypertensive heart disease with heart failure: Secondary | ICD-10-CM | POA: Diagnosis not present

## 2017-09-29 DIAGNOSIS — M6281 Muscle weakness (generalized): Secondary | ICD-10-CM | POA: Diagnosis not present

## 2017-09-29 DIAGNOSIS — S42202D Unspecified fracture of upper end of left humerus, subsequent encounter for fracture with routine healing: Secondary | ICD-10-CM | POA: Diagnosis not present

## 2017-09-29 DIAGNOSIS — I5030 Unspecified diastolic (congestive) heart failure: Secondary | ICD-10-CM | POA: Diagnosis not present

## 2017-09-29 DIAGNOSIS — J449 Chronic obstructive pulmonary disease, unspecified: Secondary | ICD-10-CM | POA: Diagnosis not present

## 2017-09-29 DIAGNOSIS — E119 Type 2 diabetes mellitus without complications: Secondary | ICD-10-CM | POA: Diagnosis not present

## 2017-10-04 DIAGNOSIS — J449 Chronic obstructive pulmonary disease, unspecified: Secondary | ICD-10-CM | POA: Diagnosis not present

## 2017-10-04 DIAGNOSIS — S42202D Unspecified fracture of upper end of left humerus, subsequent encounter for fracture with routine healing: Secondary | ICD-10-CM | POA: Diagnosis not present

## 2017-10-04 DIAGNOSIS — M6281 Muscle weakness (generalized): Secondary | ICD-10-CM | POA: Diagnosis not present

## 2017-10-04 DIAGNOSIS — E119 Type 2 diabetes mellitus without complications: Secondary | ICD-10-CM | POA: Diagnosis not present

## 2017-10-04 DIAGNOSIS — I110 Hypertensive heart disease with heart failure: Secondary | ICD-10-CM | POA: Diagnosis not present

## 2017-10-04 DIAGNOSIS — I5030 Unspecified diastolic (congestive) heart failure: Secondary | ICD-10-CM | POA: Diagnosis not present

## 2017-10-06 DIAGNOSIS — I110 Hypertensive heart disease with heart failure: Secondary | ICD-10-CM | POA: Diagnosis not present

## 2017-10-06 DIAGNOSIS — J449 Chronic obstructive pulmonary disease, unspecified: Secondary | ICD-10-CM | POA: Diagnosis not present

## 2017-10-06 DIAGNOSIS — M6281 Muscle weakness (generalized): Secondary | ICD-10-CM | POA: Diagnosis not present

## 2017-10-06 DIAGNOSIS — E119 Type 2 diabetes mellitus without complications: Secondary | ICD-10-CM | POA: Diagnosis not present

## 2017-10-06 DIAGNOSIS — I5030 Unspecified diastolic (congestive) heart failure: Secondary | ICD-10-CM | POA: Diagnosis not present

## 2017-10-06 DIAGNOSIS — S42202D Unspecified fracture of upper end of left humerus, subsequent encounter for fracture with routine healing: Secondary | ICD-10-CM | POA: Diagnosis not present

## 2017-10-07 DIAGNOSIS — M6281 Muscle weakness (generalized): Secondary | ICD-10-CM | POA: Diagnosis not present

## 2017-10-07 DIAGNOSIS — J449 Chronic obstructive pulmonary disease, unspecified: Secondary | ICD-10-CM | POA: Diagnosis not present

## 2017-10-07 DIAGNOSIS — S42202D Unspecified fracture of upper end of left humerus, subsequent encounter for fracture with routine healing: Secondary | ICD-10-CM | POA: Diagnosis not present

## 2017-10-07 DIAGNOSIS — I110 Hypertensive heart disease with heart failure: Secondary | ICD-10-CM | POA: Diagnosis not present

## 2017-10-07 DIAGNOSIS — E119 Type 2 diabetes mellitus without complications: Secondary | ICD-10-CM | POA: Diagnosis not present

## 2017-10-07 DIAGNOSIS — I5030 Unspecified diastolic (congestive) heart failure: Secondary | ICD-10-CM | POA: Diagnosis not present

## 2017-10-09 DIAGNOSIS — M6281 Muscle weakness (generalized): Secondary | ICD-10-CM | POA: Diagnosis not present

## 2017-10-09 DIAGNOSIS — I110 Hypertensive heart disease with heart failure: Secondary | ICD-10-CM | POA: Diagnosis not present

## 2017-10-09 DIAGNOSIS — I5030 Unspecified diastolic (congestive) heart failure: Secondary | ICD-10-CM | POA: Diagnosis not present

## 2017-10-09 DIAGNOSIS — J449 Chronic obstructive pulmonary disease, unspecified: Secondary | ICD-10-CM | POA: Diagnosis not present

## 2017-10-09 DIAGNOSIS — S42202D Unspecified fracture of upper end of left humerus, subsequent encounter for fracture with routine healing: Secondary | ICD-10-CM | POA: Diagnosis not present

## 2017-10-09 DIAGNOSIS — E119 Type 2 diabetes mellitus without complications: Secondary | ICD-10-CM | POA: Diagnosis not present

## 2017-10-12 DIAGNOSIS — I5030 Unspecified diastolic (congestive) heart failure: Secondary | ICD-10-CM | POA: Diagnosis not present

## 2017-10-12 DIAGNOSIS — J449 Chronic obstructive pulmonary disease, unspecified: Secondary | ICD-10-CM | POA: Diagnosis not present

## 2017-10-12 DIAGNOSIS — S42202D Unspecified fracture of upper end of left humerus, subsequent encounter for fracture with routine healing: Secondary | ICD-10-CM | POA: Diagnosis not present

## 2017-10-12 DIAGNOSIS — M6281 Muscle weakness (generalized): Secondary | ICD-10-CM | POA: Diagnosis not present

## 2017-10-12 DIAGNOSIS — E119 Type 2 diabetes mellitus without complications: Secondary | ICD-10-CM | POA: Diagnosis not present

## 2017-10-12 DIAGNOSIS — I110 Hypertensive heart disease with heart failure: Secondary | ICD-10-CM | POA: Diagnosis not present

## 2017-10-14 DIAGNOSIS — I110 Hypertensive heart disease with heart failure: Secondary | ICD-10-CM | POA: Diagnosis not present

## 2017-10-14 DIAGNOSIS — S42202D Unspecified fracture of upper end of left humerus, subsequent encounter for fracture with routine healing: Secondary | ICD-10-CM | POA: Diagnosis not present

## 2017-10-14 DIAGNOSIS — J449 Chronic obstructive pulmonary disease, unspecified: Secondary | ICD-10-CM | POA: Diagnosis not present

## 2017-10-14 DIAGNOSIS — M6281 Muscle weakness (generalized): Secondary | ICD-10-CM | POA: Diagnosis not present

## 2017-10-14 DIAGNOSIS — E119 Type 2 diabetes mellitus without complications: Secondary | ICD-10-CM | POA: Diagnosis not present

## 2017-10-14 DIAGNOSIS — I5030 Unspecified diastolic (congestive) heart failure: Secondary | ICD-10-CM | POA: Diagnosis not present

## 2017-10-17 DIAGNOSIS — I5030 Unspecified diastolic (congestive) heart failure: Secondary | ICD-10-CM | POA: Diagnosis not present

## 2017-10-17 DIAGNOSIS — I110 Hypertensive heart disease with heart failure: Secondary | ICD-10-CM | POA: Diagnosis not present

## 2017-10-17 DIAGNOSIS — M6281 Muscle weakness (generalized): Secondary | ICD-10-CM | POA: Diagnosis not present

## 2017-10-17 DIAGNOSIS — E119 Type 2 diabetes mellitus without complications: Secondary | ICD-10-CM | POA: Diagnosis not present

## 2017-10-17 DIAGNOSIS — J449 Chronic obstructive pulmonary disease, unspecified: Secondary | ICD-10-CM | POA: Diagnosis not present

## 2017-10-17 DIAGNOSIS — S42202D Unspecified fracture of upper end of left humerus, subsequent encounter for fracture with routine healing: Secondary | ICD-10-CM | POA: Diagnosis not present

## 2017-10-20 DIAGNOSIS — J449 Chronic obstructive pulmonary disease, unspecified: Secondary | ICD-10-CM | POA: Diagnosis not present

## 2017-10-20 DIAGNOSIS — E119 Type 2 diabetes mellitus without complications: Secondary | ICD-10-CM | POA: Diagnosis not present

## 2017-10-20 DIAGNOSIS — I5030 Unspecified diastolic (congestive) heart failure: Secondary | ICD-10-CM | POA: Diagnosis not present

## 2017-10-20 DIAGNOSIS — M6281 Muscle weakness (generalized): Secondary | ICD-10-CM | POA: Diagnosis not present

## 2017-10-20 DIAGNOSIS — S42202D Unspecified fracture of upper end of left humerus, subsequent encounter for fracture with routine healing: Secondary | ICD-10-CM | POA: Diagnosis not present

## 2017-10-20 DIAGNOSIS — I110 Hypertensive heart disease with heart failure: Secondary | ICD-10-CM | POA: Diagnosis not present

## 2017-10-21 DIAGNOSIS — E119 Type 2 diabetes mellitus without complications: Secondary | ICD-10-CM | POA: Diagnosis not present

## 2017-10-21 DIAGNOSIS — J449 Chronic obstructive pulmonary disease, unspecified: Secondary | ICD-10-CM | POA: Diagnosis not present

## 2017-10-21 DIAGNOSIS — I5030 Unspecified diastolic (congestive) heart failure: Secondary | ICD-10-CM | POA: Diagnosis not present

## 2017-10-21 DIAGNOSIS — S42202D Unspecified fracture of upper end of left humerus, subsequent encounter for fracture with routine healing: Secondary | ICD-10-CM | POA: Diagnosis not present

## 2017-10-21 DIAGNOSIS — M6281 Muscle weakness (generalized): Secondary | ICD-10-CM | POA: Diagnosis not present

## 2017-10-21 DIAGNOSIS — I110 Hypertensive heart disease with heart failure: Secondary | ICD-10-CM | POA: Diagnosis not present

## 2017-10-25 DIAGNOSIS — I5030 Unspecified diastolic (congestive) heart failure: Secondary | ICD-10-CM | POA: Diagnosis not present

## 2017-10-25 DIAGNOSIS — E119 Type 2 diabetes mellitus without complications: Secondary | ICD-10-CM | POA: Diagnosis not present

## 2017-10-25 DIAGNOSIS — S42202D Unspecified fracture of upper end of left humerus, subsequent encounter for fracture with routine healing: Secondary | ICD-10-CM | POA: Diagnosis not present

## 2017-10-25 DIAGNOSIS — I110 Hypertensive heart disease with heart failure: Secondary | ICD-10-CM | POA: Diagnosis not present

## 2017-10-25 DIAGNOSIS — M6281 Muscle weakness (generalized): Secondary | ICD-10-CM | POA: Diagnosis not present

## 2017-10-25 DIAGNOSIS — J449 Chronic obstructive pulmonary disease, unspecified: Secondary | ICD-10-CM | POA: Diagnosis not present

## 2017-10-28 DIAGNOSIS — E119 Type 2 diabetes mellitus without complications: Secondary | ICD-10-CM | POA: Diagnosis not present

## 2017-10-28 DIAGNOSIS — S42202D Unspecified fracture of upper end of left humerus, subsequent encounter for fracture with routine healing: Secondary | ICD-10-CM | POA: Diagnosis not present

## 2017-10-28 DIAGNOSIS — J449 Chronic obstructive pulmonary disease, unspecified: Secondary | ICD-10-CM | POA: Diagnosis not present

## 2017-10-28 DIAGNOSIS — I5030 Unspecified diastolic (congestive) heart failure: Secondary | ICD-10-CM | POA: Diagnosis not present

## 2017-10-28 DIAGNOSIS — M6281 Muscle weakness (generalized): Secondary | ICD-10-CM | POA: Diagnosis not present

## 2017-10-28 DIAGNOSIS — I110 Hypertensive heart disease with heart failure: Secondary | ICD-10-CM | POA: Diagnosis not present

## 2017-11-01 ENCOUNTER — Ambulatory Visit: Payer: Medicare Other | Admitting: Cardiology

## 2017-11-01 DIAGNOSIS — E119 Type 2 diabetes mellitus without complications: Secondary | ICD-10-CM | POA: Diagnosis not present

## 2017-11-01 DIAGNOSIS — S42202D Unspecified fracture of upper end of left humerus, subsequent encounter for fracture with routine healing: Secondary | ICD-10-CM | POA: Diagnosis not present

## 2017-11-01 DIAGNOSIS — I110 Hypertensive heart disease with heart failure: Secondary | ICD-10-CM | POA: Diagnosis not present

## 2017-11-01 DIAGNOSIS — I5030 Unspecified diastolic (congestive) heart failure: Secondary | ICD-10-CM | POA: Diagnosis not present

## 2017-11-01 DIAGNOSIS — J449 Chronic obstructive pulmonary disease, unspecified: Secondary | ICD-10-CM | POA: Diagnosis not present

## 2017-11-01 DIAGNOSIS — M6281 Muscle weakness (generalized): Secondary | ICD-10-CM | POA: Diagnosis not present

## 2017-11-04 DIAGNOSIS — E119 Type 2 diabetes mellitus without complications: Secondary | ICD-10-CM | POA: Diagnosis not present

## 2017-11-04 DIAGNOSIS — M6281 Muscle weakness (generalized): Secondary | ICD-10-CM | POA: Diagnosis not present

## 2017-11-04 DIAGNOSIS — I5030 Unspecified diastolic (congestive) heart failure: Secondary | ICD-10-CM | POA: Diagnosis not present

## 2017-11-04 DIAGNOSIS — J449 Chronic obstructive pulmonary disease, unspecified: Secondary | ICD-10-CM | POA: Diagnosis not present

## 2017-11-04 DIAGNOSIS — I110 Hypertensive heart disease with heart failure: Secondary | ICD-10-CM | POA: Diagnosis not present

## 2017-11-04 DIAGNOSIS — S42202D Unspecified fracture of upper end of left humerus, subsequent encounter for fracture with routine healing: Secondary | ICD-10-CM | POA: Diagnosis not present

## 2017-11-07 DIAGNOSIS — S42202D Unspecified fracture of upper end of left humerus, subsequent encounter for fracture with routine healing: Secondary | ICD-10-CM | POA: Diagnosis not present

## 2017-11-07 DIAGNOSIS — J449 Chronic obstructive pulmonary disease, unspecified: Secondary | ICD-10-CM | POA: Diagnosis not present

## 2017-11-07 DIAGNOSIS — I5030 Unspecified diastolic (congestive) heart failure: Secondary | ICD-10-CM | POA: Diagnosis not present

## 2017-11-07 DIAGNOSIS — E119 Type 2 diabetes mellitus without complications: Secondary | ICD-10-CM | POA: Diagnosis not present

## 2017-11-07 DIAGNOSIS — I110 Hypertensive heart disease with heart failure: Secondary | ICD-10-CM | POA: Diagnosis not present

## 2017-11-07 DIAGNOSIS — M6281 Muscle weakness (generalized): Secondary | ICD-10-CM | POA: Diagnosis not present

## 2017-11-11 DIAGNOSIS — I5030 Unspecified diastolic (congestive) heart failure: Secondary | ICD-10-CM | POA: Diagnosis not present

## 2017-11-11 DIAGNOSIS — I110 Hypertensive heart disease with heart failure: Secondary | ICD-10-CM | POA: Diagnosis not present

## 2017-11-11 DIAGNOSIS — M6281 Muscle weakness (generalized): Secondary | ICD-10-CM | POA: Diagnosis not present

## 2017-11-11 DIAGNOSIS — S42202D Unspecified fracture of upper end of left humerus, subsequent encounter for fracture with routine healing: Secondary | ICD-10-CM | POA: Diagnosis not present

## 2017-11-11 DIAGNOSIS — E119 Type 2 diabetes mellitus without complications: Secondary | ICD-10-CM | POA: Diagnosis not present

## 2017-11-11 DIAGNOSIS — J449 Chronic obstructive pulmonary disease, unspecified: Secondary | ICD-10-CM | POA: Diagnosis not present

## 2017-11-15 DIAGNOSIS — M6281 Muscle weakness (generalized): Secondary | ICD-10-CM | POA: Diagnosis not present

## 2017-11-15 DIAGNOSIS — J449 Chronic obstructive pulmonary disease, unspecified: Secondary | ICD-10-CM | POA: Diagnosis not present

## 2017-11-15 DIAGNOSIS — E119 Type 2 diabetes mellitus without complications: Secondary | ICD-10-CM | POA: Diagnosis not present

## 2017-11-15 DIAGNOSIS — S42202D Unspecified fracture of upper end of left humerus, subsequent encounter for fracture with routine healing: Secondary | ICD-10-CM | POA: Diagnosis not present

## 2017-11-15 DIAGNOSIS — I5030 Unspecified diastolic (congestive) heart failure: Secondary | ICD-10-CM | POA: Diagnosis not present

## 2017-11-15 DIAGNOSIS — I110 Hypertensive heart disease with heart failure: Secondary | ICD-10-CM | POA: Diagnosis not present

## 2017-11-18 DIAGNOSIS — E119 Type 2 diabetes mellitus without complications: Secondary | ICD-10-CM | POA: Diagnosis not present

## 2017-11-18 DIAGNOSIS — S42202D Unspecified fracture of upper end of left humerus, subsequent encounter for fracture with routine healing: Secondary | ICD-10-CM | POA: Diagnosis not present

## 2017-11-18 DIAGNOSIS — M6281 Muscle weakness (generalized): Secondary | ICD-10-CM | POA: Diagnosis not present

## 2017-11-18 DIAGNOSIS — J449 Chronic obstructive pulmonary disease, unspecified: Secondary | ICD-10-CM | POA: Diagnosis not present

## 2017-11-18 DIAGNOSIS — I110 Hypertensive heart disease with heart failure: Secondary | ICD-10-CM | POA: Diagnosis not present

## 2017-11-18 DIAGNOSIS — I5030 Unspecified diastolic (congestive) heart failure: Secondary | ICD-10-CM | POA: Diagnosis not present

## 2017-11-22 DIAGNOSIS — S42202D Unspecified fracture of upper end of left humerus, subsequent encounter for fracture with routine healing: Secondary | ICD-10-CM | POA: Diagnosis not present

## 2017-11-22 DIAGNOSIS — I110 Hypertensive heart disease with heart failure: Secondary | ICD-10-CM | POA: Diagnosis not present

## 2017-11-22 DIAGNOSIS — J449 Chronic obstructive pulmonary disease, unspecified: Secondary | ICD-10-CM | POA: Diagnosis not present

## 2017-11-22 DIAGNOSIS — M6281 Muscle weakness (generalized): Secondary | ICD-10-CM | POA: Diagnosis not present

## 2017-11-22 DIAGNOSIS — I5030 Unspecified diastolic (congestive) heart failure: Secondary | ICD-10-CM | POA: Diagnosis not present

## 2017-11-22 DIAGNOSIS — E119 Type 2 diabetes mellitus without complications: Secondary | ICD-10-CM | POA: Diagnosis not present

## 2017-11-23 DIAGNOSIS — R238 Other skin changes: Secondary | ICD-10-CM | POA: Diagnosis not present

## 2017-11-23 DIAGNOSIS — J449 Chronic obstructive pulmonary disease, unspecified: Secondary | ICD-10-CM | POA: Diagnosis not present

## 2017-11-23 DIAGNOSIS — Z950 Presence of cardiac pacemaker: Secondary | ICD-10-CM | POA: Diagnosis not present

## 2017-11-23 DIAGNOSIS — Z4800 Encounter for change or removal of nonsurgical wound dressing: Secondary | ICD-10-CM | POA: Diagnosis not present

## 2017-11-23 DIAGNOSIS — F17211 Nicotine dependence, cigarettes, in remission: Secondary | ICD-10-CM | POA: Diagnosis not present

## 2017-11-23 DIAGNOSIS — S42202D Unspecified fracture of upper end of left humerus, subsequent encounter for fracture with routine healing: Secondary | ICD-10-CM | POA: Diagnosis not present

## 2017-11-23 DIAGNOSIS — Z5181 Encounter for therapeutic drug level monitoring: Secondary | ICD-10-CM | POA: Diagnosis not present

## 2017-11-23 DIAGNOSIS — G4733 Obstructive sleep apnea (adult) (pediatric): Secondary | ICD-10-CM | POA: Diagnosis not present

## 2017-11-23 DIAGNOSIS — M6281 Muscle weakness (generalized): Secondary | ICD-10-CM | POA: Diagnosis not present

## 2017-11-23 DIAGNOSIS — S81801D Unspecified open wound, right lower leg, subsequent encounter: Secondary | ICD-10-CM | POA: Diagnosis not present

## 2017-11-23 DIAGNOSIS — M48061 Spinal stenosis, lumbar region without neurogenic claudication: Secondary | ICD-10-CM | POA: Diagnosis not present

## 2017-11-23 DIAGNOSIS — D530 Protein deficiency anemia: Secondary | ICD-10-CM | POA: Diagnosis not present

## 2017-11-23 DIAGNOSIS — I110 Hypertensive heart disease with heart failure: Secondary | ICD-10-CM | POA: Diagnosis not present

## 2017-11-23 DIAGNOSIS — E119 Type 2 diabetes mellitus without complications: Secondary | ICD-10-CM | POA: Diagnosis not present

## 2017-11-23 DIAGNOSIS — Z794 Long term (current) use of insulin: Secondary | ICD-10-CM | POA: Diagnosis not present

## 2017-11-23 DIAGNOSIS — I5030 Unspecified diastolic (congestive) heart failure: Secondary | ICD-10-CM | POA: Diagnosis not present

## 2017-11-23 DIAGNOSIS — S81802D Unspecified open wound, left lower leg, subsequent encounter: Secondary | ICD-10-CM | POA: Diagnosis not present

## 2017-11-23 DIAGNOSIS — D6489 Other specified anemias: Secondary | ICD-10-CM | POA: Diagnosis not present

## 2017-11-24 ENCOUNTER — Encounter: Payer: Self-pay | Admitting: Cardiology

## 2017-11-24 ENCOUNTER — Ambulatory Visit (INDEPENDENT_AMBULATORY_CARE_PROVIDER_SITE_OTHER): Payer: Medicare Other | Admitting: Cardiology

## 2017-11-24 VITALS — BP 122/70 | HR 63 | Ht 72.0 in | Wt 252.8 lb

## 2017-11-24 DIAGNOSIS — I5033 Acute on chronic diastolic (congestive) heart failure: Secondary | ICD-10-CM

## 2017-11-24 DIAGNOSIS — E782 Mixed hyperlipidemia: Secondary | ICD-10-CM | POA: Diagnosis not present

## 2017-11-24 DIAGNOSIS — R238 Other skin changes: Secondary | ICD-10-CM | POA: Diagnosis not present

## 2017-11-24 DIAGNOSIS — I5030 Unspecified diastolic (congestive) heart failure: Secondary | ICD-10-CM | POA: Diagnosis not present

## 2017-11-24 DIAGNOSIS — S81801D Unspecified open wound, right lower leg, subsequent encounter: Secondary | ICD-10-CM | POA: Diagnosis not present

## 2017-11-24 DIAGNOSIS — I495 Sick sinus syndrome: Secondary | ICD-10-CM

## 2017-11-24 DIAGNOSIS — I110 Hypertensive heart disease with heart failure: Secondary | ICD-10-CM | POA: Diagnosis not present

## 2017-11-24 DIAGNOSIS — I4892 Unspecified atrial flutter: Secondary | ICD-10-CM | POA: Diagnosis not present

## 2017-11-24 DIAGNOSIS — I10 Essential (primary) hypertension: Secondary | ICD-10-CM

## 2017-11-24 DIAGNOSIS — E119 Type 2 diabetes mellitus without complications: Secondary | ICD-10-CM | POA: Diagnosis not present

## 2017-11-24 DIAGNOSIS — S81802D Unspecified open wound, left lower leg, subsequent encounter: Secondary | ICD-10-CM | POA: Diagnosis not present

## 2017-11-24 MED ORDER — ATENOLOL 25 MG PO TABS: 25.0000 mg | ORAL_TABLET | Freq: Every day | ORAL | 3 refills | Status: DC

## 2017-11-24 MED ORDER — FUROSEMIDE 40 MG PO TABS: 60.0000 mg | ORAL_TABLET | Freq: Every day | ORAL | 1 refills | Status: DC

## 2017-11-24 NOTE — Progress Notes (Signed)
Clinical Summary [name] is a 84 y.o.male  seen today for follow up of the following medical problems.   1.Sick sinus syndrome - history of pacemaker placement in 1996. Replaced in 2008 - [facility] at [facility]. From there notes he has a MDT Adapta DR device placed in 2008.    - normal device check 07/2017. No recent symptoms.   2. DM2 - followed by pcp   3. Hyperlipidemia - compliant with statin - 06/2016 TC 157 TG 262 HDL 31 LDL 74 - no longer taking statin for unclear reasons. He does have elevated LFTs, perhaps related.   - most recent labs with pcp  4. HTN - compliant with meds  5. OSA - history of OSA, has not wanted to wear CPAP  6. Aflutter - noted on prior device checks - frequent falls, has not been started on anticoag.   - no recent palpitations   7. Chronic LE edema - uncomfortable with compression stockings - reports recent increased swelling - lasix increased from 20 to 40mg  by pcp recently.    8. GI bleed - admit 04/2017 with GI bleed - just discharged from rehab Past Medical History:  Diagnosis Date  . Cardiac pacemaker in situ    for sick sinus syndrome-[facility] 09[facility]  . Diabetes [facility](HCC)   . GERD (gastroesophageal reflux disease)   . Mixed hyperlipidemia   . Obstructive lung disease (HCC)    non compliant with home O2  . Pulmonary hypertension (HCC)   . Sleep apnea   . Thrombocytopenia (HCC)   . Venous insufficiency (chronic) (peripheral)      Allergies  Allergen Reactions  . Advair Diskus [Fluticasone-Salmeterol] Other (See Comments)    Reaction:  Unknown   . Combivent [Ipratropium-Albuterol] Other (See Comments)    Reaction:  Unknown   . Lotensin Hct [Benazepril-Hydrochlorothiazide] Other (See Comments)    Reaction:  Unknown   . Penicillins Swelling and Other (See Comments)    Reaction:  Facial swelling Has patient had a PCN reaction causing immediate rash, facial/tongue/throat  swelling, SOB or lightheadedness with hypotension: Yes Has patient had a PCN reaction causing severe rash involving mucus membranes or skin necrosis: No Has patient had a PCN reaction that required hospitalization No Has patient had a PCN reaction occurring within the last 10 years: No If all of the above answers are "NO", then may proceed with Cephalosporin use.  . Simvastatin Other (See Comments)    Reaction:  Muscle pain      Current Outpatient Medications  Medication Sig Dispense Refill  . acetaminophen (TYLENOL) 500 MG tablet Take 1,000 mg by mouth every 6 (six) hours as needed for mild pain, moderate pain or headache.     Marland Kitchen. aspirin EC 81 MG tablet Take 81 mg by mouth daily.    Marland Kitchen. atenolol (TENORMIN) 25 MG tablet Take 25 mg by mouth daily.     . DULoxetine (CYMBALTA) 30 MG capsule Take 30 mg by mouth daily.    . furosemide (LASIX) 20 MG tablet Take 1 tablet (20 mg total) daily by mouth. 30 tablet 6  . insulin glargine (LANTUS) 100 UNIT/ML injection Inject 0.1 mLs (10 Units total) into the skin at bedtime. (Patient taking differently: Inject 12 Units into the skin at bedtime. ) 10 mL 11  . meclizine (ANTIVERT) 25 MG tablet Take 1 tablet (25 mg total) by mouth 3 (three) times daily as needed for dizziness. 30 tablet 0  . Multiple  Vitamin (MULTIVITAMIN WITH MINERALS) TABS tablet Take 1 tablet by mouth daily.    . Multiple Vitamins-Minerals (ICAPS AREDS 2) CAPS Take 1 capsule by mouth daily.    . polyethylene glycol (MIRALAX / GLYCOLAX) packet Take 17 g by mouth daily as needed for mild constipation. 14 each 0  . traMADol (ULTRAM) 50 MG tablet Take 1 tablet (50 mg total) by mouth every 12 (twelve) hours as needed for moderate pain (or Headache unrelieved by tylenol). 10 tablet 0   No current facility-administered medications for this visit.      Past Surgical History:  Procedure Laterality Date  . CATARACT EXTRACTION, BILATERAL Bilateral   . COLONOSCOPY N/A 04/24/2017   Procedure:  COLONOSCOPY;  Surgeon: Corbin Ade, MD;  [facility];  Service: Endoscopy;  Laterality: N/A;  . ESOPHAGOGASTRODUODENOSCOPY N/A 04/24/2017   Procedure: ESOPHAGOGASTRODUODENOSCOPY (EGD);  Surgeon: Corbin Ade, MD;  [facility];  Service: Endoscopy;  Laterality: N/A;  . KIDNEY STONE SURGERY Left    laser ablation   . PACEMAKER IMPLANT    . PERCUTANEOUS PINNING Right 04/20/2016   Procedure: IRRIGATION AND DEBRIDEMENT RIGHT FOOT, PERCUTANEOUS PINNING SMALL TOE;  Surgeon: Tarry Kos, MD;  Location: [facility] OR;  Service: Orthopedics;  Laterality: Right;  . REFRACTIVE SURGERY Right   . ROTATOR CUFF REPAIR Left      Allergies  Allergen Reactions  . Advair Diskus [Fluticasone-Salmeterol] Other (See Comments)    Reaction:  Unknown   . Combivent [Ipratropium-Albuterol] Other (See Comments)    Reaction:  Unknown   . Lotensin Hct [Benazepril-Hydrochlorothiazide] Other (See Comments)    Reaction:  Unknown   . Penicillins Swelling and Other (See Comments)    Reaction:  Facial swelling Has patient had a PCN reaction causing immediate rash, facial/tongue/throat swelling, SOB or lightheadedness with hypotension: Yes Has patient had a PCN reaction causing severe rash involving mucus membranes or skin necrosis: No Has patient had a PCN reaction that required hospitalization No Has patient had a PCN reaction occurring within the last 10 years: No If all of the above answers are "NO", then may proceed with Cephalosporin use.  . Simvastatin Other (See Comments)    Reaction:  Muscle pain       Family History  Problem Relation Age of Onset  . Diabetes Mother   . Colon cancer Neg Hx   . Colon polyps Neg Hx      Social History [name] reports that he quit smoking about 39 years ago. His smoking use included cigarettes. He started smoking about 69 years ago. He has a 15.00 pack-year smoking history. he has never used smokeless tobacco. [name] reports that he does not drink  alcohol.   Review of Systems CONSTITUTIONAL: No weight loss, fever, chills, weakness or fatigue.  HEENT: Eyes: No visual loss, blurred vision, double vision or yellow sclerae.No hearing loss, sneezing, congestion, runny nose or sore throat.  SKIN: No rash or itching.  CARDIOVASCULAR: per hpi RESPIRATORY: No shortness of breath, cough or sputum.  GASTROINTESTINAL: No anorexia, nausea, vomiting or diarrhea. No abdominal pain or blood.  GENITOURINARY: No burning on urination, no polyuria NEUROLOGICAL: No headache, dizziness, syncope, paralysis, ataxia, numbness or tingling in the extremities. No change in bowel or bladder control.  MUSCULOSKELETAL: No muscle, back pain, joint pain or stiffness.  LYMPHATICS: No enlarged nodes. No history of splenectomy.  PSYCHIATRIC: No history of depression or anxiety.  ENDOCRINOLOGIC: No reports of sweating, cold or heat intolerance. No polyuria  or polydipsia.  Marland Kitchen   Physical Examination Vitals:   11/24/17 1054  BP: 122/70  Pulse: 63  SpO2: 96%   Vitals:   11/24/17 1054  Weight: 252 lb 12.8 oz (114.7 kg)  Height: 6' (1.829 m)    Gen: resting comfortably, no acute distress HEENT: no scleral icterus, pupils equal round and reactive, no palptable cervical adenopathy,  CV: RRR, no mr/g, no jvd Resp: Clear to auscultation bilaterally GI: abdomen is soft, non-tender, non-distended, normal bowel sounds, no hepatosplenomegaly MSK: extremities are warm, 1+ bilateral edema Skin: warm, no rash Neuro:  no focal deficits Psych: appropriate affect   Diagnostic Studies     Assessment and Plan  1. Sick sinus syndrome - continue to follow pacemaker in device clinic, no symptoms.   2. Hyperlipidemia - off statin for unclear reasons. Did have recent elevation in LFTs. - defer to pcp  3. HTN - bp at goal, continue current meds  4. Aflutter - no recent symptoms - not on anticoag due to history of frequent falls as well as recent GI bleed. Check  cbc with recent GI bleed  5. Acute on chronic diastolic HF - increased LE edema, we will increase lasix to 60mg  dialy.  - repeat labs in 1 weekd with BMET/Mg  F/u 2 months      Antoine Poche, M.D.

## 2017-11-24 NOTE — Patient Instructions (Signed)
Your physician recommends that you schedule a follow-up appointment in: [facility]  Your physician has recommended you make the following change in your medication:   INCREASE LASIX 60 MG (1 & 1/2 TABLETS) DAILY   Your physician recommends that you return for lab work 1 WEEK BMP/MG/CBC  Thank you for choosing [facility]!!

## 2017-11-27 ENCOUNTER — Encounter: Payer: Self-pay | Admitting: Cardiology

## 2017-11-29 DIAGNOSIS — E119 Type 2 diabetes mellitus without complications: Secondary | ICD-10-CM | POA: Diagnosis not present

## 2017-11-29 DIAGNOSIS — I5030 Unspecified diastolic (congestive) heart failure: Secondary | ICD-10-CM | POA: Diagnosis not present

## 2017-11-29 DIAGNOSIS — I110 Hypertensive heart disease with heart failure: Secondary | ICD-10-CM | POA: Diagnosis not present

## 2017-11-29 DIAGNOSIS — S81802D Unspecified open wound, left lower leg, subsequent encounter: Secondary | ICD-10-CM | POA: Diagnosis not present

## 2017-11-29 DIAGNOSIS — R238 Other skin changes: Secondary | ICD-10-CM | POA: Diagnosis not present

## 2017-11-29 DIAGNOSIS — S81801D Unspecified open wound, right lower leg, subsequent encounter: Secondary | ICD-10-CM | POA: Diagnosis not present

## 2017-12-02 DIAGNOSIS — I5030 Unspecified diastolic (congestive) heart failure: Secondary | ICD-10-CM | POA: Diagnosis not present

## 2017-12-02 DIAGNOSIS — S81801D Unspecified open wound, right lower leg, subsequent encounter: Secondary | ICD-10-CM | POA: Diagnosis not present

## 2017-12-02 DIAGNOSIS — E119 Type 2 diabetes mellitus without complications: Secondary | ICD-10-CM | POA: Diagnosis not present

## 2017-12-02 DIAGNOSIS — S81802D Unspecified open wound, left lower leg, subsequent encounter: Secondary | ICD-10-CM | POA: Diagnosis not present

## 2017-12-02 DIAGNOSIS — I110 Hypertensive heart disease with heart failure: Secondary | ICD-10-CM | POA: Diagnosis not present

## 2017-12-02 DIAGNOSIS — R238 Other skin changes: Secondary | ICD-10-CM | POA: Diagnosis not present

## 2017-12-05 DIAGNOSIS — S81801D Unspecified open wound, right lower leg, subsequent encounter: Secondary | ICD-10-CM | POA: Diagnosis not present

## 2017-12-05 DIAGNOSIS — I110 Hypertensive heart disease with heart failure: Secondary | ICD-10-CM | POA: Diagnosis not present

## 2017-12-05 DIAGNOSIS — R238 Other skin changes: Secondary | ICD-10-CM | POA: Diagnosis not present

## 2017-12-05 DIAGNOSIS — S81802D Unspecified open wound, left lower leg, subsequent encounter: Secondary | ICD-10-CM | POA: Diagnosis not present

## 2017-12-05 DIAGNOSIS — I5030 Unspecified diastolic (congestive) heart failure: Secondary | ICD-10-CM | POA: Diagnosis not present

## 2017-12-05 DIAGNOSIS — E119 Type 2 diabetes mellitus without complications: Secondary | ICD-10-CM | POA: Diagnosis not present

## 2017-12-06 DIAGNOSIS — S81802D Unspecified open wound, left lower leg, subsequent encounter: Secondary | ICD-10-CM | POA: Diagnosis not present

## 2017-12-06 DIAGNOSIS — I110 Hypertensive heart disease with heart failure: Secondary | ICD-10-CM | POA: Diagnosis not present

## 2017-12-06 DIAGNOSIS — I5030 Unspecified diastolic (congestive) heart failure: Secondary | ICD-10-CM | POA: Diagnosis not present

## 2017-12-06 DIAGNOSIS — E119 Type 2 diabetes mellitus without complications: Secondary | ICD-10-CM | POA: Diagnosis not present

## 2017-12-06 DIAGNOSIS — R238 Other skin changes: Secondary | ICD-10-CM | POA: Diagnosis not present

## 2017-12-06 DIAGNOSIS — S81801D Unspecified open wound, right lower leg, subsequent encounter: Secondary | ICD-10-CM | POA: Diagnosis not present

## 2017-12-09 ENCOUNTER — Encounter: Payer: Self-pay | Admitting: *Deleted

## 2017-12-09 DIAGNOSIS — S81802D Unspecified open wound, left lower leg, subsequent encounter: Secondary | ICD-10-CM | POA: Diagnosis not present

## 2017-12-09 DIAGNOSIS — S81801D Unspecified open wound, right lower leg, subsequent encounter: Secondary | ICD-10-CM | POA: Diagnosis not present

## 2017-12-09 DIAGNOSIS — E119 Type 2 diabetes mellitus without complications: Secondary | ICD-10-CM | POA: Diagnosis not present

## 2017-12-09 DIAGNOSIS — R238 Other skin changes: Secondary | ICD-10-CM | POA: Diagnosis not present

## 2017-12-09 DIAGNOSIS — I110 Hypertensive heart disease with heart failure: Secondary | ICD-10-CM | POA: Diagnosis not present

## 2017-12-09 DIAGNOSIS — I5030 Unspecified diastolic (congestive) heart failure: Secondary | ICD-10-CM | POA: Diagnosis not present

## 2017-12-13 DIAGNOSIS — I110 Hypertensive heart disease with heart failure: Secondary | ICD-10-CM | POA: Diagnosis not present

## 2017-12-13 DIAGNOSIS — R238 Other skin changes: Secondary | ICD-10-CM | POA: Diagnosis not present

## 2017-12-13 DIAGNOSIS — S81801D Unspecified open wound, right lower leg, subsequent encounter: Secondary | ICD-10-CM | POA: Diagnosis not present

## 2017-12-13 DIAGNOSIS — I5030 Unspecified diastolic (congestive) heart failure: Secondary | ICD-10-CM | POA: Diagnosis not present

## 2017-12-13 DIAGNOSIS — S81802D Unspecified open wound, left lower leg, subsequent encounter: Secondary | ICD-10-CM | POA: Diagnosis not present

## 2017-12-13 DIAGNOSIS — E119 Type 2 diabetes mellitus without complications: Secondary | ICD-10-CM | POA: Diagnosis not present

## 2017-12-15 ENCOUNTER — Telehealth: Payer: Self-pay | Admitting: *Deleted

## 2017-12-15 NOTE — Telephone Encounter (Signed)
-----   Message from Antoine PocheJonathan F Branch, MD sent at 12/13/2017 12:58 PM EST ----- Labs look good  Dominga FerryJ Branch MD

## 2017-12-15 NOTE — Telephone Encounter (Signed)
Pt aware and voiced understanding - route to pcp

## 2017-12-16 DIAGNOSIS — S81802D Unspecified open wound, left lower leg, subsequent encounter: Secondary | ICD-10-CM | POA: Diagnosis not present

## 2017-12-16 DIAGNOSIS — S81801D Unspecified open wound, right lower leg, subsequent encounter: Secondary | ICD-10-CM | POA: Diagnosis not present

## 2017-12-16 DIAGNOSIS — E119 Type 2 diabetes mellitus without complications: Secondary | ICD-10-CM | POA: Diagnosis not present

## 2017-12-16 DIAGNOSIS — I110 Hypertensive heart disease with heart failure: Secondary | ICD-10-CM | POA: Diagnosis not present

## 2017-12-16 DIAGNOSIS — I5030 Unspecified diastolic (congestive) heart failure: Secondary | ICD-10-CM | POA: Diagnosis not present

## 2017-12-16 DIAGNOSIS — R238 Other skin changes: Secondary | ICD-10-CM | POA: Diagnosis not present

## 2017-12-19 ENCOUNTER — Telehealth: Payer: Self-pay

## 2017-12-19 NOTE — Telephone Encounter (Signed)
-----   Message from Alan PocheJonathan F Branch, MD sent at 12/19/2017  1:19 PM EST ----- Labs look good  Dominga FerryJ Branch MD

## 2017-12-19 NOTE — Telephone Encounter (Signed)
Patient notified. Routed to PCP 

## 2017-12-20 ENCOUNTER — Telehealth: Payer: Self-pay | Admitting: Cardiology

## 2017-12-20 DIAGNOSIS — S81802D Unspecified open wound, left lower leg, subsequent encounter: Secondary | ICD-10-CM | POA: Diagnosis not present

## 2017-12-20 DIAGNOSIS — R238 Other skin changes: Secondary | ICD-10-CM | POA: Diagnosis not present

## 2017-12-20 DIAGNOSIS — I110 Hypertensive heart disease with heart failure: Secondary | ICD-10-CM | POA: Diagnosis not present

## 2017-12-20 DIAGNOSIS — E119 Type 2 diabetes mellitus without complications: Secondary | ICD-10-CM | POA: Diagnosis not present

## 2017-12-20 DIAGNOSIS — S81801D Unspecified open wound, right lower leg, subsequent encounter: Secondary | ICD-10-CM | POA: Diagnosis not present

## 2017-12-20 DIAGNOSIS — I5030 Unspecified diastolic (congestive) heart failure: Secondary | ICD-10-CM | POA: Diagnosis not present

## 2017-12-20 NOTE — Telephone Encounter (Signed)
Is this ok and if so 15-6020mmHg knee high?

## 2017-12-20 NOTE — Telephone Encounter (Signed)
Everrett Coombeammy Bray RN with [facility] called wanting to get a RX for compression stockings. They are doing Profore wraps and when they stop these they want to switch over  To the compression stockings.   Please call # [phone].

## 2017-12-21 NOTE — Telephone Encounter (Signed)
Tammy from [facility] aware that we would fax rx to [facility] [place] as requested.

## 2017-12-21 NOTE — Telephone Encounter (Signed)
Yes that is fine   J Alan Oswald MD 

## 2017-12-23 DIAGNOSIS — I110 Hypertensive heart disease with heart failure: Secondary | ICD-10-CM | POA: Diagnosis not present

## 2017-12-23 DIAGNOSIS — S81801D Unspecified open wound, right lower leg, subsequent encounter: Secondary | ICD-10-CM | POA: Diagnosis not present

## 2017-12-23 DIAGNOSIS — R238 Other skin changes: Secondary | ICD-10-CM | POA: Diagnosis not present

## 2017-12-23 DIAGNOSIS — I5030 Unspecified diastolic (congestive) heart failure: Secondary | ICD-10-CM | POA: Diagnosis not present

## 2017-12-23 DIAGNOSIS — S81802D Unspecified open wound, left lower leg, subsequent encounter: Secondary | ICD-10-CM | POA: Diagnosis not present

## 2017-12-23 DIAGNOSIS — E119 Type 2 diabetes mellitus without complications: Secondary | ICD-10-CM | POA: Diagnosis not present

## 2017-12-26 DIAGNOSIS — S81802D Unspecified open wound, left lower leg, subsequent encounter: Secondary | ICD-10-CM | POA: Diagnosis not present

## 2017-12-26 DIAGNOSIS — E119 Type 2 diabetes mellitus without complications: Secondary | ICD-10-CM | POA: Diagnosis not present

## 2017-12-26 DIAGNOSIS — S81801D Unspecified open wound, right lower leg, subsequent encounter: Secondary | ICD-10-CM | POA: Diagnosis not present

## 2017-12-26 DIAGNOSIS — I5030 Unspecified diastolic (congestive) heart failure: Secondary | ICD-10-CM | POA: Diagnosis not present

## 2017-12-26 DIAGNOSIS — I110 Hypertensive heart disease with heart failure: Secondary | ICD-10-CM | POA: Diagnosis not present

## 2017-12-26 DIAGNOSIS — R238 Other skin changes: Secondary | ICD-10-CM | POA: Diagnosis not present

## 2017-12-29 DIAGNOSIS — R238 Other skin changes: Secondary | ICD-10-CM | POA: Diagnosis not present

## 2017-12-29 DIAGNOSIS — S81801D Unspecified open wound, right lower leg, subsequent encounter: Secondary | ICD-10-CM | POA: Diagnosis not present

## 2017-12-29 DIAGNOSIS — I5030 Unspecified diastolic (congestive) heart failure: Secondary | ICD-10-CM | POA: Diagnosis not present

## 2017-12-29 DIAGNOSIS — S81802D Unspecified open wound, left lower leg, subsequent encounter: Secondary | ICD-10-CM | POA: Diagnosis not present

## 2017-12-29 DIAGNOSIS — I110 Hypertensive heart disease with heart failure: Secondary | ICD-10-CM | POA: Diagnosis not present

## 2017-12-29 DIAGNOSIS — E119 Type 2 diabetes mellitus without complications: Secondary | ICD-10-CM | POA: Diagnosis not present

## 2018-01-02 DIAGNOSIS — I110 Hypertensive heart disease with heart failure: Secondary | ICD-10-CM | POA: Diagnosis not present

## 2018-01-02 DIAGNOSIS — S81802D Unspecified open wound, left lower leg, subsequent encounter: Secondary | ICD-10-CM | POA: Diagnosis not present

## 2018-01-02 DIAGNOSIS — S81801D Unspecified open wound, right lower leg, subsequent encounter: Secondary | ICD-10-CM | POA: Diagnosis not present

## 2018-01-02 DIAGNOSIS — E119 Type 2 diabetes mellitus without complications: Secondary | ICD-10-CM | POA: Diagnosis not present

## 2018-01-02 DIAGNOSIS — R238 Other skin changes: Secondary | ICD-10-CM | POA: Diagnosis not present

## 2018-01-02 DIAGNOSIS — I5030 Unspecified diastolic (congestive) heart failure: Secondary | ICD-10-CM | POA: Diagnosis not present

## 2018-01-05 DIAGNOSIS — I110 Hypertensive heart disease with heart failure: Secondary | ICD-10-CM | POA: Diagnosis not present

## 2018-01-05 DIAGNOSIS — S81801D Unspecified open wound, right lower leg, subsequent encounter: Secondary | ICD-10-CM | POA: Diagnosis not present

## 2018-01-05 DIAGNOSIS — I5030 Unspecified diastolic (congestive) heart failure: Secondary | ICD-10-CM | POA: Diagnosis not present

## 2018-01-05 DIAGNOSIS — S81802D Unspecified open wound, left lower leg, subsequent encounter: Secondary | ICD-10-CM | POA: Diagnosis not present

## 2018-01-05 DIAGNOSIS — E119 Type 2 diabetes mellitus without complications: Secondary | ICD-10-CM | POA: Diagnosis not present

## 2018-01-05 DIAGNOSIS — R238 Other skin changes: Secondary | ICD-10-CM | POA: Diagnosis not present

## 2018-01-10 DIAGNOSIS — I5030 Unspecified diastolic (congestive) heart failure: Secondary | ICD-10-CM | POA: Diagnosis not present

## 2018-01-10 DIAGNOSIS — E119 Type 2 diabetes mellitus without complications: Secondary | ICD-10-CM | POA: Diagnosis not present

## 2018-01-10 DIAGNOSIS — S81802D Unspecified open wound, left lower leg, subsequent encounter: Secondary | ICD-10-CM | POA: Diagnosis not present

## 2018-01-10 DIAGNOSIS — I110 Hypertensive heart disease with heart failure: Secondary | ICD-10-CM | POA: Diagnosis not present

## 2018-01-10 DIAGNOSIS — R238 Other skin changes: Secondary | ICD-10-CM | POA: Diagnosis not present

## 2018-01-10 DIAGNOSIS — S81801D Unspecified open wound, right lower leg, subsequent encounter: Secondary | ICD-10-CM | POA: Diagnosis not present

## 2018-01-13 DIAGNOSIS — R69 Illness, unspecified: Secondary | ICD-10-CM | POA: Diagnosis not present

## 2018-01-13 DIAGNOSIS — S81801D Unspecified open wound, right lower leg, subsequent encounter: Secondary | ICD-10-CM | POA: Diagnosis not present

## 2018-01-13 DIAGNOSIS — S81802D Unspecified open wound, left lower leg, subsequent encounter: Secondary | ICD-10-CM | POA: Diagnosis not present

## 2018-01-13 DIAGNOSIS — E119 Type 2 diabetes mellitus without complications: Secondary | ICD-10-CM | POA: Diagnosis not present

## 2018-01-13 DIAGNOSIS — I110 Hypertensive heart disease with heart failure: Secondary | ICD-10-CM | POA: Diagnosis not present

## 2018-01-13 DIAGNOSIS — I5030 Unspecified diastolic (congestive) heart failure: Secondary | ICD-10-CM | POA: Diagnosis not present

## 2018-01-13 DIAGNOSIS — R238 Other skin changes: Secondary | ICD-10-CM | POA: Diagnosis not present

## 2018-01-17 DIAGNOSIS — E119 Type 2 diabetes mellitus without complications: Secondary | ICD-10-CM | POA: Diagnosis not present

## 2018-01-17 DIAGNOSIS — S81801D Unspecified open wound, right lower leg, subsequent encounter: Secondary | ICD-10-CM | POA: Diagnosis not present

## 2018-01-17 DIAGNOSIS — R238 Other skin changes: Secondary | ICD-10-CM | POA: Diagnosis not present

## 2018-01-17 DIAGNOSIS — I110 Hypertensive heart disease with heart failure: Secondary | ICD-10-CM | POA: Diagnosis not present

## 2018-01-17 DIAGNOSIS — I5030 Unspecified diastolic (congestive) heart failure: Secondary | ICD-10-CM | POA: Diagnosis not present

## 2018-01-17 DIAGNOSIS — S81802D Unspecified open wound, left lower leg, subsequent encounter: Secondary | ICD-10-CM | POA: Diagnosis not present

## 2018-01-20 ENCOUNTER — Telehealth: Payer: Self-pay | Admitting: Cardiology

## 2018-01-20 DIAGNOSIS — I110 Hypertensive heart disease with heart failure: Secondary | ICD-10-CM | POA: Diagnosis not present

## 2018-01-20 DIAGNOSIS — S81802D Unspecified open wound, left lower leg, subsequent encounter: Secondary | ICD-10-CM | POA: Diagnosis not present

## 2018-01-20 DIAGNOSIS — R238 Other skin changes: Secondary | ICD-10-CM | POA: Diagnosis not present

## 2018-01-20 DIAGNOSIS — E119 Type 2 diabetes mellitus without complications: Secondary | ICD-10-CM | POA: Diagnosis not present

## 2018-01-20 DIAGNOSIS — I5030 Unspecified diastolic (congestive) heart failure: Secondary | ICD-10-CM | POA: Diagnosis not present

## 2018-01-20 DIAGNOSIS — S81801D Unspecified open wound, right lower leg, subsequent encounter: Secondary | ICD-10-CM | POA: Diagnosis not present

## 2018-01-20 MED ORDER — FUROSEMIDE 40 MG PO TABS: 80.0000 mg | ORAL_TABLET | Freq: Every day | ORAL | 1 refills | Status: DC

## 2018-01-20 NOTE — Telephone Encounter (Signed)
Increase lasix to 80mg  daily. Have them update us on Monday   Dominga FerryJ Branch MD

## 2018-01-20 NOTE — Telephone Encounter (Signed)
[facility]

## 2018-01-20 NOTE — Telephone Encounter (Signed)
[facility] calling to report that patient's legs are extremely swollen and draining. Taking Lasix 60 mg QD. Wants to know if he wants to increase if for a couple days to decrease swelling. [facility] has already contacted PCP as well. / tg

## 2018-01-22 DIAGNOSIS — M48061 Spinal stenosis, lumbar region without neurogenic claudication: Secondary | ICD-10-CM | POA: Diagnosis not present

## 2018-01-22 DIAGNOSIS — E119 Type 2 diabetes mellitus without complications: Secondary | ICD-10-CM | POA: Diagnosis not present

## 2018-01-22 DIAGNOSIS — Z87891 Personal history of nicotine dependence: Secondary | ICD-10-CM | POA: Diagnosis not present

## 2018-01-22 DIAGNOSIS — Z4800 Encounter for change or removal of nonsurgical wound dressing: Secondary | ICD-10-CM | POA: Diagnosis not present

## 2018-01-22 DIAGNOSIS — S81801D Unspecified open wound, right lower leg, subsequent encounter: Secondary | ICD-10-CM | POA: Diagnosis not present

## 2018-01-22 DIAGNOSIS — M6281 Muscle weakness (generalized): Secondary | ICD-10-CM | POA: Diagnosis not present

## 2018-01-22 DIAGNOSIS — M25611 Stiffness of right shoulder, not elsewhere classified: Secondary | ICD-10-CM | POA: Diagnosis not present

## 2018-01-22 DIAGNOSIS — E1140 Type 2 diabetes mellitus with diabetic neuropathy, unspecified: Secondary | ICD-10-CM | POA: Diagnosis not present

## 2018-01-22 DIAGNOSIS — Z950 Presence of cardiac pacemaker: Secondary | ICD-10-CM | POA: Diagnosis not present

## 2018-01-22 DIAGNOSIS — Z794 Long term (current) use of insulin: Secondary | ICD-10-CM | POA: Diagnosis not present

## 2018-01-22 DIAGNOSIS — I10 Essential (primary) hypertension: Secondary | ICD-10-CM | POA: Diagnosis not present

## 2018-01-22 DIAGNOSIS — Z6833 Body mass index (BMI) 33.0-33.9, adult: Secondary | ICD-10-CM | POA: Diagnosis not present

## 2018-01-22 DIAGNOSIS — M25612 Stiffness of left shoulder, not elsewhere classified: Secondary | ICD-10-CM | POA: Diagnosis not present

## 2018-01-22 DIAGNOSIS — J449 Chronic obstructive pulmonary disease, unspecified: Secondary | ICD-10-CM | POA: Diagnosis not present

## 2018-01-22 DIAGNOSIS — Z5181 Encounter for therapeutic drug level monitoring: Secondary | ICD-10-CM | POA: Diagnosis not present

## 2018-01-22 DIAGNOSIS — G4733 Obstructive sleep apnea (adult) (pediatric): Secondary | ICD-10-CM | POA: Diagnosis not present

## 2018-01-22 DIAGNOSIS — Z742 Need for assistance at home and no other household member able to render care: Secondary | ICD-10-CM | POA: Diagnosis not present

## 2018-01-22 DIAGNOSIS — I5030 Unspecified diastolic (congestive) heart failure: Secondary | ICD-10-CM | POA: Diagnosis not present

## 2018-01-22 DIAGNOSIS — D6489 Other specified anemias: Secondary | ICD-10-CM | POA: Diagnosis not present

## 2018-01-22 DIAGNOSIS — E782 Mixed hyperlipidemia: Secondary | ICD-10-CM | POA: Diagnosis not present

## 2018-01-22 DIAGNOSIS — R238 Other skin changes: Secondary | ICD-10-CM | POA: Diagnosis not present

## 2018-01-22 DIAGNOSIS — R42 Dizziness and giddiness: Secondary | ICD-10-CM | POA: Diagnosis not present

## 2018-01-22 DIAGNOSIS — S81802D Unspecified open wound, left lower leg, subsequent encounter: Secondary | ICD-10-CM | POA: Diagnosis not present

## 2018-01-22 DIAGNOSIS — D530 Protein deficiency anemia: Secondary | ICD-10-CM | POA: Diagnosis not present

## 2018-01-22 DIAGNOSIS — I110 Hypertensive heart disease with heart failure: Secondary | ICD-10-CM | POA: Diagnosis not present

## 2018-01-23 ENCOUNTER — Telehealth: Payer: Self-pay | Admitting: *Deleted

## 2018-01-23 DIAGNOSIS — S81802D Unspecified open wound, left lower leg, subsequent encounter: Secondary | ICD-10-CM | POA: Diagnosis not present

## 2018-01-23 DIAGNOSIS — S81801D Unspecified open wound, right lower leg, subsequent encounter: Secondary | ICD-10-CM | POA: Diagnosis not present

## 2018-01-23 DIAGNOSIS — J449 Chronic obstructive pulmonary disease, unspecified: Secondary | ICD-10-CM | POA: Diagnosis not present

## 2018-01-23 DIAGNOSIS — I110 Hypertensive heart disease with heart failure: Secondary | ICD-10-CM | POA: Diagnosis not present

## 2018-01-23 DIAGNOSIS — E119 Type 2 diabetes mellitus without complications: Secondary | ICD-10-CM | POA: Diagnosis not present

## 2018-01-23 DIAGNOSIS — I5030 Unspecified diastolic (congestive) heart failure: Secondary | ICD-10-CM | POA: Diagnosis not present

## 2018-01-23 NOTE — Telephone Encounter (Signed)
Continue the increased lasix dose until our f/u   Dominga FerryJ Danaly Bari MD

## 2018-01-23 NOTE — Telephone Encounter (Signed)
[facility] made aware and will contact pt

## 2018-01-23 NOTE — Telephone Encounter (Signed)
Update from Tammy with [facility] - says with increase of lasix over the weekend pt leg edema is now +2 was +3 on Friday - pt has f/u appt with Dr Wyline MoodBranch 4/10

## 2018-01-24 DIAGNOSIS — I5030 Unspecified diastolic (congestive) heart failure: Secondary | ICD-10-CM | POA: Diagnosis not present

## 2018-01-24 DIAGNOSIS — S81801D Unspecified open wound, right lower leg, subsequent encounter: Secondary | ICD-10-CM | POA: Diagnosis not present

## 2018-01-24 DIAGNOSIS — J449 Chronic obstructive pulmonary disease, unspecified: Secondary | ICD-10-CM | POA: Diagnosis not present

## 2018-01-24 DIAGNOSIS — S81802D Unspecified open wound, left lower leg, subsequent encounter: Secondary | ICD-10-CM | POA: Diagnosis not present

## 2018-01-24 DIAGNOSIS — E119 Type 2 diabetes mellitus without complications: Secondary | ICD-10-CM | POA: Diagnosis not present

## 2018-01-24 DIAGNOSIS — I110 Hypertensive heart disease with heart failure: Secondary | ICD-10-CM | POA: Diagnosis not present

## 2018-01-25 ENCOUNTER — Encounter: Payer: Self-pay | Admitting: Cardiology

## 2018-01-25 ENCOUNTER — Ambulatory Visit (INDEPENDENT_AMBULATORY_CARE_PROVIDER_SITE_OTHER): Payer: Medicare Other | Admitting: Cardiology

## 2018-01-25 ENCOUNTER — Telehealth: Payer: Self-pay | Admitting: *Deleted

## 2018-01-25 VITALS — BP 135/77 | HR 74 | Ht 72.0 in | Wt 249.4 lb

## 2018-01-25 DIAGNOSIS — I5033 Acute on chronic diastolic (congestive) heart failure: Secondary | ICD-10-CM

## 2018-01-25 MED ORDER — TORSEMIDE 20 MG PO TABS: 40.0000 mg | ORAL_TABLET | Freq: Two times a day (BID) | ORAL | 1 refills | Status: DC

## 2018-01-25 NOTE — Telephone Encounter (Signed)
Dr Wyline MoodBranch requesting pt have remote check - pt is over due looks like no VM no answer last time. Will update contact info

## 2018-01-25 NOTE — Progress Notes (Signed)
Clinical Summary Alan Briggs is a 84 y.o.male seen today for follow up of the following medical problems. This is a focused visit on recent leg swelling     1. Chronic LE edema - uncomfortable with compressionstockings - reports recent increased swelling - lasix increased from 20 to 40mg  by pcp recently.    - recent increase in edema. He was to increase his lasix to 80mg  daily on 01/23/18 and be reassessed at followup.  - limits sodium intake. COmpliant with meds     Past Medical History:  Diagnosis Date  . Cardiac pacemaker in situ    for sick sinus syndrome-last placement was 09/2007 [facility]  . Diabetes [facility])   . GERD (gastroesophageal reflux disease)   . Mixed hyperlipidemia   . Obstructive lung disease (HCC)    non compliant with home O2  . Pulmonary hypertension (HCC)   . Sleep apnea   . Thrombocytopenia (HCC)   . Venous insufficiency (chronic) (peripheral)      Allergies  Allergen Reactions  . Advair Diskus [Fluticasone-Salmeterol] Other (See Comments)    Reaction:  Unknown   . Combivent [Ipratropium-Albuterol] Other (See Comments)    Reaction:  Unknown   . Lotensin Hct [Benazepril-Hydrochlorothiazide] Other (See Comments)    Reaction:  Unknown   . Penicillins Swelling and Other (See Comments)    Reaction:  Facial swelling Has patient had a PCN reaction causing immediate rash, facial/tongue/throat swelling, SOB or lightheadedness with hypotension: Yes Has patient had a PCN reaction causing severe rash involving mucus membranes or skin necrosis: No Has patient had a PCN reaction that required hospitalization No Has patient had a PCN reaction occurring within the last 10 years: No If all of the above answers are "NO", then may proceed with Cephalosporin use.  . Simvastatin Other (See Comments)    Reaction:  Muscle pain      Current Outpatient Medications  Medication Sig Dispense Refill  . aspirin EC 81 MG tablet Take 81 mg by mouth daily.     Marland Kitchen atenolol (TENORMIN) 25 MG tablet Take 1 tablet (25 mg total) by mouth daily. 90 tablet 3  . furosemide (LASIX) 40 MG tablet Take 2 tablets (80 mg total) by mouth daily. 180 tablet 1  . insulin glargine (LANTUS) 100 UNIT/ML injection Inject 0.1 mLs (10 Units total) into the skin at bedtime. (Patient taking differently: Inject 12 Units into the skin at bedtime. ) 10 mL 11  . insulin lispro (HUMALOG) 100 UNIT/ML injection Inject 2-4 Units into the skin once a week. As needed    . Multiple Vitamin (MULTIVITAMIN WITH MINERALS) TABS tablet Take 1 tablet by mouth daily.    . Multiple Vitamins-Minerals (ICAPS AREDS 2) CAPS Take 1 capsule by mouth daily.     No current facility-administered medications for this visit.      Past Surgical History:  Procedure Laterality Date  . CATARACT EXTRACTION, BILATERAL Bilateral   . COLONOSCOPY N/A 04/24/2017   Procedure: COLONOSCOPY;  Surgeon: Corbin Ade, MD;  [facility];  Service: Endoscopy;  Laterality: N/A;  . ESOPHAGOGASTRODUODENOSCOPY N/A 04/24/2017   Procedure: ESOPHAGOGASTRODUODENOSCOPY (EGD);  Surgeon: Corbin Ade, MD;  [facility];  Service: Endoscopy;  Laterality: N/A;  . KIDNEY STONE SURGERY Left    laser ablation   . PACEMAKER IMPLANT    . PERCUTANEOUS PINNING Right 04/20/2016   Procedure: IRRIGATION AND DEBRIDEMENT RIGHT FOOT, PERCUTANEOUS PINNING SMALL TOE;  Surgeon: Tarry Kos, MD;  Location: [facility] OR;  Service: Orthopedics;  Laterality: Right;  . REFRACTIVE SURGERY Right   . ROTATOR CUFF REPAIR Left      Allergies  Allergen Reactions  . Advair Diskus [Fluticasone-Salmeterol] Other (See Comments)    Reaction:  Unknown   . Combivent [Ipratropium-Albuterol] Other (See Comments)    Reaction:  Unknown   . Lotensin Hct [Benazepril-Hydrochlorothiazide] Other (See Comments)    Reaction:  Unknown   . Penicillins Swelling and Other (See Comments)    Reaction:  Facial swelling Has patient had a PCN reaction causing  immediate rash, facial/tongue/throat swelling, SOB or lightheadedness with hypotension: Yes Has patient had a PCN reaction causing severe rash involving mucus membranes or skin necrosis: No Has patient had a PCN reaction that required hospitalization No Has patient had a PCN reaction occurring within the last 10 years: No If all of the above answers are "NO", then may proceed with Cephalosporin use.  . Simvastatin Other (See Comments)    Reaction:  Muscle pain       Family History  Problem Relation Age of Onset  . Diabetes Mother   . Colon cancer Neg Hx   . Colon polyps Neg Hx      Social History Alan Briggs reports that he quit smoking about 39 years ago. His smoking use included cigarettes. He started smoking about 69 years ago. He has a 15.00 pack-year smoking history. He has never used smokeless tobacco. Alan Briggs reports that he does not drink alcohol.   Review of Systems CONSTITUTIONAL: No weight loss, fever, chills, weakness or fatigue.  HEENT: Eyes: No visual loss, blurred vision, double vision or yellow sclerae.No hearing loss, sneezing, congestion, runny nose or sore throat.  SKIN: No rash or itching.  CARDIOVASCULAR: no chest pain, no palpitaiotns.  RESPIRATORY: No shortness of breath, cough or sputum.  GASTROINTESTINAL: No anorexia, nausea, vomiting or diarrhea. No abdominal pain or blood.  GENITOURINARY: No burning on urination, no polyuria NEUROLOGICAL: No headache, dizziness, syncope, paralysis, ataxia, numbness or tingling in the extremities. No change in bowel or bladder control.  MUSCULOSKELETAL: No muscle, back pain, joint pain or stiffness.  LYMPHATICS: No enlarged nodes. No history of splenectomy.  PSYCHIATRIC: No history of depression or anxiety.  ENDOCRINOLOGIC: No reports of sweating, cold or heat intolerance. No polyuria or polydipsia.  Marland Kitchen.   Physical Examination Vitals:   01/25/18 1136  BP: 135/77  Pulse: 74  SpO2: 97%   Vitals:   01/25/18 1136    Weight: 249 lb 6.4 oz (113.1 kg)  Height: 6' (1.829 m)    Gen: resting comfortably, no acute distress HEENT: no scleral icterus, pupils equal round and reactive, no palptable cervical adenopathy,  CV: RRR, no m/r/g no jvd Resp: Clear to auscultation bilaterally GI: abdomen is soft, non-tender, non-distended, normal bowel sounds, no hepatosplenomegaly MSK: extremities are warm, 1+ bilateral LE edema.  Skin: warm, no rash Neuro:  no focal deficits Psych: appropriate affect      Assessment and Plan  1. Acute on chronic diastolic HF - ongoing edema, we will change lasix to torsemide 5548m gdaily. Check BMET/Mg in 2 weeks - EKG in clinic today shows he is AV paced.    F/u 1 month       Antoine PocheJonathan F. Rajanee Schuelke, M.D.

## 2018-01-25 NOTE — Patient Instructions (Signed)
Your physician recommends that you schedule a follow-up appointment in: [facility]  Your physician has recommended you make the following change in your medication:   STOP LASIX   START TORSEMIDE 40 MG (2 TABLETS) DAILY  Your physician recommends that you return for lab work 2 WEEKS BMP/MG - ORDERS GIVEN TO YOU TODAY  Thank you for choosing [facility]!!

## 2018-01-25 NOTE — Telephone Encounter (Signed)
Spoke with [name] and asked that he send in a transmission pt stated that he would.

## 2018-01-26 ENCOUNTER — Ambulatory Visit (INDEPENDENT_AMBULATORY_CARE_PROVIDER_SITE_OTHER): Payer: Medicare Other | Admitting: *Deleted

## 2018-01-26 DIAGNOSIS — I110 Hypertensive heart disease with heart failure: Secondary | ICD-10-CM | POA: Diagnosis not present

## 2018-01-26 DIAGNOSIS — I495 Sick sinus syndrome: Secondary | ICD-10-CM

## 2018-01-26 DIAGNOSIS — S81802D Unspecified open wound, left lower leg, subsequent encounter: Secondary | ICD-10-CM | POA: Diagnosis not present

## 2018-01-26 DIAGNOSIS — S81801D Unspecified open wound, right lower leg, subsequent encounter: Secondary | ICD-10-CM | POA: Diagnosis not present

## 2018-01-26 DIAGNOSIS — E119 Type 2 diabetes mellitus without complications: Secondary | ICD-10-CM | POA: Diagnosis not present

## 2018-01-26 DIAGNOSIS — I5030 Unspecified diastolic (congestive) heart failure: Secondary | ICD-10-CM | POA: Diagnosis not present

## 2018-01-26 DIAGNOSIS — J449 Chronic obstructive pulmonary disease, unspecified: Secondary | ICD-10-CM | POA: Diagnosis not present

## 2018-01-26 NOTE — Progress Notes (Signed)
Remote pacemaker transmission.   

## 2018-01-29 ENCOUNTER — Encounter: Payer: Self-pay | Admitting: Cardiology

## 2018-01-30 DIAGNOSIS — S81802D Unspecified open wound, left lower leg, subsequent encounter: Secondary | ICD-10-CM | POA: Diagnosis not present

## 2018-01-30 DIAGNOSIS — I5030 Unspecified diastolic (congestive) heart failure: Secondary | ICD-10-CM | POA: Diagnosis not present

## 2018-01-30 DIAGNOSIS — J449 Chronic obstructive pulmonary disease, unspecified: Secondary | ICD-10-CM | POA: Diagnosis not present

## 2018-01-30 DIAGNOSIS — I110 Hypertensive heart disease with heart failure: Secondary | ICD-10-CM | POA: Diagnosis not present

## 2018-01-30 DIAGNOSIS — E119 Type 2 diabetes mellitus without complications: Secondary | ICD-10-CM | POA: Diagnosis not present

## 2018-01-30 DIAGNOSIS — S81801D Unspecified open wound, right lower leg, subsequent encounter: Secondary | ICD-10-CM | POA: Diagnosis not present

## 2018-02-01 ENCOUNTER — Encounter: Payer: Self-pay | Admitting: Cardiology

## 2018-02-02 DIAGNOSIS — S81802D Unspecified open wound, left lower leg, subsequent encounter: Secondary | ICD-10-CM | POA: Diagnosis not present

## 2018-02-02 DIAGNOSIS — E119 Type 2 diabetes mellitus without complications: Secondary | ICD-10-CM | POA: Diagnosis not present

## 2018-02-02 DIAGNOSIS — S81801D Unspecified open wound, right lower leg, subsequent encounter: Secondary | ICD-10-CM | POA: Diagnosis not present

## 2018-02-02 DIAGNOSIS — I110 Hypertensive heart disease with heart failure: Secondary | ICD-10-CM | POA: Diagnosis not present

## 2018-02-02 DIAGNOSIS — I5030 Unspecified diastolic (congestive) heart failure: Secondary | ICD-10-CM | POA: Diagnosis not present

## 2018-02-02 DIAGNOSIS — J449 Chronic obstructive pulmonary disease, unspecified: Secondary | ICD-10-CM | POA: Diagnosis not present

## 2018-02-06 DIAGNOSIS — J449 Chronic obstructive pulmonary disease, unspecified: Secondary | ICD-10-CM | POA: Diagnosis not present

## 2018-02-06 DIAGNOSIS — S81802D Unspecified open wound, left lower leg, subsequent encounter: Secondary | ICD-10-CM | POA: Diagnosis not present

## 2018-02-06 DIAGNOSIS — I110 Hypertensive heart disease with heart failure: Secondary | ICD-10-CM | POA: Diagnosis not present

## 2018-02-06 DIAGNOSIS — I5030 Unspecified diastolic (congestive) heart failure: Secondary | ICD-10-CM | POA: Diagnosis not present

## 2018-02-06 DIAGNOSIS — S81801D Unspecified open wound, right lower leg, subsequent encounter: Secondary | ICD-10-CM | POA: Diagnosis not present

## 2018-02-06 DIAGNOSIS — E119 Type 2 diabetes mellitus without complications: Secondary | ICD-10-CM | POA: Diagnosis not present

## 2018-02-09 DIAGNOSIS — E119 Type 2 diabetes mellitus without complications: Secondary | ICD-10-CM | POA: Diagnosis not present

## 2018-02-09 DIAGNOSIS — E1151 Type 2 diabetes mellitus with diabetic peripheral angiopathy without gangrene: Secondary | ICD-10-CM | POA: Diagnosis not present

## 2018-02-09 DIAGNOSIS — I5030 Unspecified diastolic (congestive) heart failure: Secondary | ICD-10-CM | POA: Diagnosis not present

## 2018-02-09 DIAGNOSIS — S81802D Unspecified open wound, left lower leg, subsequent encounter: Secondary | ICD-10-CM | POA: Diagnosis not present

## 2018-02-09 DIAGNOSIS — J449 Chronic obstructive pulmonary disease, unspecified: Secondary | ICD-10-CM | POA: Diagnosis not present

## 2018-02-09 DIAGNOSIS — I110 Hypertensive heart disease with heart failure: Secondary | ICD-10-CM | POA: Diagnosis not present

## 2018-02-09 DIAGNOSIS — I5022 Chronic systolic (congestive) heart failure: Secondary | ICD-10-CM | POA: Diagnosis not present

## 2018-02-09 DIAGNOSIS — S81801D Unspecified open wound, right lower leg, subsequent encounter: Secondary | ICD-10-CM | POA: Diagnosis not present

## 2018-02-13 DIAGNOSIS — S81802D Unspecified open wound, left lower leg, subsequent encounter: Secondary | ICD-10-CM | POA: Diagnosis not present

## 2018-02-13 DIAGNOSIS — I110 Hypertensive heart disease with heart failure: Secondary | ICD-10-CM | POA: Diagnosis not present

## 2018-02-13 DIAGNOSIS — J449 Chronic obstructive pulmonary disease, unspecified: Secondary | ICD-10-CM | POA: Diagnosis not present

## 2018-02-13 DIAGNOSIS — I5030 Unspecified diastolic (congestive) heart failure: Secondary | ICD-10-CM | POA: Diagnosis not present

## 2018-02-13 DIAGNOSIS — S81801D Unspecified open wound, right lower leg, subsequent encounter: Secondary | ICD-10-CM | POA: Diagnosis not present

## 2018-02-13 DIAGNOSIS — E119 Type 2 diabetes mellitus without complications: Secondary | ICD-10-CM | POA: Diagnosis not present

## 2018-02-16 ENCOUNTER — Encounter: Payer: Self-pay | Admitting: Cardiology

## 2018-02-16 ENCOUNTER — Ambulatory Visit (INDEPENDENT_AMBULATORY_CARE_PROVIDER_SITE_OTHER): Payer: Medicare Other | Admitting: Cardiology

## 2018-02-16 ENCOUNTER — Other Ambulatory Visit: Payer: Self-pay

## 2018-02-16 VITALS — BP 138/81 | HR 75 | Ht 72.0 in | Wt 247.0 lb

## 2018-02-16 DIAGNOSIS — I5033 Acute on chronic diastolic (congestive) heart failure: Secondary | ICD-10-CM | POA: Diagnosis not present

## 2018-02-16 MED ORDER — MECLIZINE HCL 25 MG PO TABS: 25.0000 mg | ORAL_TABLET | Freq: Three times a day (TID) | ORAL | 1 refills | Status: DC | PRN

## 2018-02-16 NOTE — Patient Instructions (Signed)
Your physician wants you to follow-up in: 4 MONTHS WITH DR [facility] You will receive a reminder letter in the mail two months in advance. If you don't receive a letter, please call our office to schedule the follow-up appointment.  Your physician has recommended you make the following change in your medication:   START MECLIZINE 25 MG EVERY 8 HOURS AS NEEDED Thank you for choosing [facility]!!

## 2018-02-16 NOTE — Progress Notes (Signed)
Clinical Summary [name] is a 84 y.o.male seen today for follow up of the following medical problems. This is a focused visit on recent leg swelling     1. Chronic LE edema - last visit we changed him to torsemide  daily.  - since that time swelling is improving. COntinues to have legs wrapped by home health, reports previous sores have resolved - weight down 2 lbs since last visit      Past Medical History:  Diagnosis Date  . Cardiac pacemaker in situ    for sick sinus syndrome-last placement was 09/2007 [facility]  . Diabetes [facility])   . GERD (gastroesophageal reflux disease)   . Mixed hyperlipidemia   . Obstructive lung disease (HCC)    non compliant with home O2  . Pulmonary hypertension (HCC)   . Sleep apnea   . Thrombocytopenia (HCC)   . Venous insufficiency (chronic) (peripheral)      Allergies  Allergen Reactions  . Advair Diskus [Fluticasone-Salmeterol] Other (See Comments)    Reaction:  Unknown   . Combivent [Ipratropium-Albuterol] Other (See Comments)    Reaction:  Unknown   . Lotensin Hct [Benazepril-Hydrochlorothiazide] Other (See Comments)    Reaction:  Unknown   . Penicillins Swelling and Other (See Comments)    Reaction:  Facial swelling Has patient had a PCN reaction causing immediate rash, facial/tongue/throat swelling, SOB or lightheadedness with hypotension: Yes Has patient had a PCN reaction causing severe rash involving mucus membranes or skin necrosis: No Has patient had a PCN reaction that required hospitalization No Has patient had a PCN reaction occurring within the last 10 years: No If all of the above answers are "NO", then may proceed with Cephalosporin use.  . Simvastatin Other (See Comments)    Reaction:  Muscle pain      Current Outpatient Medications  Medication Sig Dispense Refill  . aspirin EC 81 MG tablet Take 81 mg by mouth daily.    Marland Kitchen atenolol (TENORMIN) 25 MG tablet Take 1 tablet (25 mg total) by mouth  daily. 90 tablet 3  . insulin glargine (LANTUS) 100 UNIT/ML injection Inject 0.1 mLs (10 Units total) into the skin at bedtime. (Patient taking differently: Inject 12 Units into the skin at bedtime. ) 10 mL 11  . insulin lispro (HUMALOG) 100 UNIT/ML injection Inject 2-4 Units into the skin once a week. As needed    . Multiple Vitamin (MULTIVITAMIN WITH MINERALS) TABS tablet Take 1 tablet by mouth daily.    . Multiple Vitamins-Minerals (ICAPS AREDS 2) CAPS Take 1 capsule by mouth daily.    Marland Kitchen torsemide (DEMADEX) 20 MG tablet Take 2 tablets (40 mg total) by mouth 2 (two) times daily. 180 tablet 1   No current facility-administered medications for this visit.      Past Surgical History:  Procedure Laterality Date  . CATARACT EXTRACTION, BILATERAL Bilateral   . COLONOSCOPY N/A 04/24/2017   Procedure: COLONOSCOPY;  Surgeon: Corbin Ade, MD;  [facility];  Service: Endoscopy;  Laterality: N/A;  . ESOPHAGOGASTRODUODENOSCOPY N/A 04/24/2017   Procedure: ESOPHAGOGASTRODUODENOSCOPY (EGD);  Surgeon: Corbin Ade, MD;  [facility]: [facility];  Service: Endoscopy;  Laterality: N/A;  . KIDNEY STONE SURGERY Left    laser ablation   . PACEMAKER IMPLANT    . PERCUTANEOUS PINNING Right 04/20/2016   Procedure: IRRIGATION AND DEBRIDEMENT RIGHT FOOT, PERCUTANEOUS PINNING SMALL TOE;  Surgeon: Tarry Kos, MD;  Location: [facility] OR;  Service: Orthopedics;  Laterality:  Right;  Marland Kitchen REFRACTIVE SURGERY Right   . ROTATOR CUFF REPAIR Left      Allergies  Allergen Reactions  . Advair Diskus [Fluticasone-Salmeterol] Other (See Comments)    Reaction:  Unknown   . Combivent [Ipratropium-Albuterol] Other (See Comments)    Reaction:  Unknown   . Lotensin Hct [Benazepril-Hydrochlorothiazide] Other (See Comments)    Reaction:  Unknown   . Penicillins Swelling and Other (See Comments)    Reaction:  Facial swelling Has patient had a PCN reaction causing immediate rash, facial/tongue/throat swelling, SOB or  lightheadedness with hypotension: Yes Has patient had a PCN reaction causing severe rash involving mucus membranes or skin necrosis: No Has patient had a PCN reaction that required hospitalization No Has patient had a PCN reaction occurring within the last 10 years: No If all of the above answers are "NO", then may proceed with Cephalosporin use.  . Simvastatin Other (See Comments)    Reaction:  Muscle pain       Family History  Problem Relation Age of Onset  . Diabetes Mother   . Colon cancer Neg Hx   . Colon polyps Neg Hx      Social History [name] reports that he quit smoking about 39 years ago. His smoking use included cigarettes. He started smoking about 69 years ago. He has a 15.00 pack-year smoking history. He has never used smokeless tobacco. [name] reports that he does not drink alcohol.   Review of Systems CONSTITUTIONAL: No weight loss, fever, chills, weakness or fatigue.  HEENT: Eyes: No visual loss, blurred vision, double vision or yellow sclerae.No hearing loss, sneezing, congestion, runny nose or sore throat.  SKIN: No rash or itching.  CARDIOVASCULAR: no chest pain, no palpitaitons.  RESPIRATORY: No shortness of breath, cough or sputum.  GASTROINTESTINAL: No anorexia, nausea, vomiting or diarrhea. No abdominal pain or blood.  GENITOURINARY: No burning on urination, no polyuria NEUROLOGICAL: +vertigo MUSCULOSKELETAL: No muscle, back pain, joint pain or stiffness.  LYMPHATICS: No enlarged nodes. No history of splenectomy.  PSYCHIATRIC: No history of depression or anxiety.  ENDOCRINOLOGIC: No reports of sweating, cold or heat intolerance. No polyuria or polydipsia.  Marland Kitchen   Physical Examination Vitals:   02/16/18 1136  BP: 138/81  Pulse: 75  SpO2: 94%   Vitals:   02/16/18 1136  Weight: 247 lb (112 kg)  Height: 6' (1.829 m)    Gen: resting comfortably, no acute distress HEENT: no scleral icterus, pupils equal round and reactive, no palptable cervical  adenopathy,  CV: RRR, no m/r/g,no jvd Resp: Clear to auscultation bilaterally GI: abdomen is soft, non-tender, non-distended, normal bowel sounds, no hepatosplenomegaly MSK: extremities are warm, trace bilateral edema Skin: warm, no rash Neuro:  no focal deficits Psych: appropriate affect   Diagnostic Studies     Assessment and Plan   1. Acute on chronic diastolic HF - improved with change to torsemide, continue current diuretic and follow repeat labs - leg wounds resolved, home health wrapping no longer required - recommended wearing home compressoin stockings, at least 5 days a week  2. Vertigo - prior history, recent symptoms - start meclizine  prn  F/u 4 months  Antoine Poche, M.D.

## 2018-02-17 DIAGNOSIS — E119 Type 2 diabetes mellitus without complications: Secondary | ICD-10-CM | POA: Diagnosis not present

## 2018-02-17 DIAGNOSIS — I110 Hypertensive heart disease with heart failure: Secondary | ICD-10-CM | POA: Diagnosis not present

## 2018-02-17 DIAGNOSIS — S81802D Unspecified open wound, left lower leg, subsequent encounter: Secondary | ICD-10-CM | POA: Diagnosis not present

## 2018-02-17 DIAGNOSIS — I5030 Unspecified diastolic (congestive) heart failure: Secondary | ICD-10-CM | POA: Diagnosis not present

## 2018-02-17 DIAGNOSIS — J449 Chronic obstructive pulmonary disease, unspecified: Secondary | ICD-10-CM | POA: Diagnosis not present

## 2018-02-17 DIAGNOSIS — S81801D Unspecified open wound, right lower leg, subsequent encounter: Secondary | ICD-10-CM | POA: Diagnosis not present

## 2018-02-21 DIAGNOSIS — E119 Type 2 diabetes mellitus without complications: Secondary | ICD-10-CM | POA: Diagnosis not present

## 2018-02-21 DIAGNOSIS — R42 Dizziness and giddiness: Secondary | ICD-10-CM | POA: Diagnosis not present

## 2018-02-21 DIAGNOSIS — M25611 Stiffness of right shoulder, not elsewhere classified: Secondary | ICD-10-CM | POA: Diagnosis not present

## 2018-02-21 DIAGNOSIS — Z950 Presence of cardiac pacemaker: Secondary | ICD-10-CM | POA: Diagnosis not present

## 2018-02-21 DIAGNOSIS — Z6833 Body mass index (BMI) 33.0-33.9, adult: Secondary | ICD-10-CM | POA: Diagnosis not present

## 2018-02-21 DIAGNOSIS — E1140 Type 2 diabetes mellitus with diabetic neuropathy, unspecified: Secondary | ICD-10-CM | POA: Diagnosis not present

## 2018-02-21 DIAGNOSIS — S81801D Unspecified open wound, right lower leg, subsequent encounter: Secondary | ICD-10-CM | POA: Diagnosis not present

## 2018-02-21 DIAGNOSIS — S81802D Unspecified open wound, left lower leg, subsequent encounter: Secondary | ICD-10-CM | POA: Diagnosis not present

## 2018-02-21 DIAGNOSIS — E782 Mixed hyperlipidemia: Secondary | ICD-10-CM | POA: Diagnosis not present

## 2018-02-21 DIAGNOSIS — I5030 Unspecified diastolic (congestive) heart failure: Secondary | ICD-10-CM | POA: Diagnosis not present

## 2018-02-21 DIAGNOSIS — M25612 Stiffness of left shoulder, not elsewhere classified: Secondary | ICD-10-CM | POA: Diagnosis not present

## 2018-02-21 DIAGNOSIS — I10 Essential (primary) hypertension: Secondary | ICD-10-CM | POA: Diagnosis not present

## 2018-02-21 DIAGNOSIS — I110 Hypertensive heart disease with heart failure: Secondary | ICD-10-CM | POA: Diagnosis not present

## 2018-02-21 DIAGNOSIS — J449 Chronic obstructive pulmonary disease, unspecified: Secondary | ICD-10-CM | POA: Diagnosis not present

## 2018-02-23 DIAGNOSIS — S81802D Unspecified open wound, left lower leg, subsequent encounter: Secondary | ICD-10-CM | POA: Diagnosis not present

## 2018-02-23 DIAGNOSIS — S81801D Unspecified open wound, right lower leg, subsequent encounter: Secondary | ICD-10-CM | POA: Diagnosis not present

## 2018-02-23 DIAGNOSIS — J449 Chronic obstructive pulmonary disease, unspecified: Secondary | ICD-10-CM | POA: Diagnosis not present

## 2018-02-23 DIAGNOSIS — I110 Hypertensive heart disease with heart failure: Secondary | ICD-10-CM | POA: Diagnosis not present

## 2018-02-23 DIAGNOSIS — I5030 Unspecified diastolic (congestive) heart failure: Secondary | ICD-10-CM | POA: Diagnosis not present

## 2018-02-23 DIAGNOSIS — E119 Type 2 diabetes mellitus without complications: Secondary | ICD-10-CM | POA: Diagnosis not present

## 2018-02-25 ENCOUNTER — Encounter: Payer: Self-pay | Admitting: Cardiology

## 2018-02-27 DIAGNOSIS — I5030 Unspecified diastolic (congestive) heart failure: Secondary | ICD-10-CM | POA: Diagnosis not present

## 2018-02-27 DIAGNOSIS — E119 Type 2 diabetes mellitus without complications: Secondary | ICD-10-CM | POA: Diagnosis not present

## 2018-02-27 DIAGNOSIS — J449 Chronic obstructive pulmonary disease, unspecified: Secondary | ICD-10-CM | POA: Diagnosis not present

## 2018-02-27 DIAGNOSIS — S81801D Unspecified open wound, right lower leg, subsequent encounter: Secondary | ICD-10-CM | POA: Diagnosis not present

## 2018-02-27 DIAGNOSIS — I110 Hypertensive heart disease with heart failure: Secondary | ICD-10-CM | POA: Diagnosis not present

## 2018-02-27 DIAGNOSIS — S81802D Unspecified open wound, left lower leg, subsequent encounter: Secondary | ICD-10-CM | POA: Diagnosis not present

## 2018-03-02 DIAGNOSIS — S81801D Unspecified open wound, right lower leg, subsequent encounter: Secondary | ICD-10-CM | POA: Diagnosis not present

## 2018-03-02 DIAGNOSIS — I110 Hypertensive heart disease with heart failure: Secondary | ICD-10-CM | POA: Diagnosis not present

## 2018-03-02 DIAGNOSIS — S81802D Unspecified open wound, left lower leg, subsequent encounter: Secondary | ICD-10-CM | POA: Diagnosis not present

## 2018-03-02 DIAGNOSIS — J449 Chronic obstructive pulmonary disease, unspecified: Secondary | ICD-10-CM | POA: Diagnosis not present

## 2018-03-02 DIAGNOSIS — I5030 Unspecified diastolic (congestive) heart failure: Secondary | ICD-10-CM | POA: Diagnosis not present

## 2018-03-02 DIAGNOSIS — E119 Type 2 diabetes mellitus without complications: Secondary | ICD-10-CM | POA: Diagnosis not present

## 2018-03-06 DIAGNOSIS — S81801D Unspecified open wound, right lower leg, subsequent encounter: Secondary | ICD-10-CM | POA: Diagnosis not present

## 2018-03-06 DIAGNOSIS — S81802D Unspecified open wound, left lower leg, subsequent encounter: Secondary | ICD-10-CM | POA: Diagnosis not present

## 2018-03-06 DIAGNOSIS — J449 Chronic obstructive pulmonary disease, unspecified: Secondary | ICD-10-CM | POA: Diagnosis not present

## 2018-03-06 DIAGNOSIS — E119 Type 2 diabetes mellitus without complications: Secondary | ICD-10-CM | POA: Diagnosis not present

## 2018-03-06 DIAGNOSIS — I110 Hypertensive heart disease with heart failure: Secondary | ICD-10-CM | POA: Diagnosis not present

## 2018-03-06 DIAGNOSIS — I5030 Unspecified diastolic (congestive) heart failure: Secondary | ICD-10-CM | POA: Diagnosis not present

## 2018-03-06 LAB — CUP PACEART REMOTE DEVICE CHECK
Battery Voltage: 2.72 V
Brady Statistic AP VS Percent: 0 %
Brady Statistic AS VP Percent: 30 %
Brady Statistic AS VS Percent: 0 %
Date Time Interrogation Session: 20190410185611
Implantable Lead Implant Date: 19960418
Implantable Lead Location: 753860
Lead Channel Impedance Value: 963 Ohm
Lead Channel Pacing Threshold Amplitude: 1.5 V
Lead Channel Pacing Threshold Pulse Width: 0.4 ms
Lead Channel Pacing Threshold Pulse Width: 0.4 ms
Lead Channel Setting Pacing Amplitude: 2.5 V
Lead Channel Setting Pacing Pulse Width: 0.76 ms
MDC IDC LEAD IMPLANT DT: 19960418
MDC IDC LEAD LOCATION: 753859
MDC IDC MSMT BATTERY IMPEDANCE: 2994 Ohm
MDC IDC MSMT BATTERY REMAINING LONGEVITY: 19 mo
MDC IDC MSMT LEADCHNL RA PACING THRESHOLD AMPLITUDE: 0.625 V
MDC IDC MSMT LEADCHNL RV IMPEDANCE VALUE: 1177 Ohm
MDC IDC PG IMPLANT DT: 20081211
MDC IDC SET LEADCHNL RA PACING AMPLITUDE: 2 V
MDC IDC SET LEADCHNL RV SENSING SENSITIVITY: 2 mV
MDC IDC STAT BRADY AP VP PERCENT: 69 %

## 2018-03-10 DIAGNOSIS — J449 Chronic obstructive pulmonary disease, unspecified: Secondary | ICD-10-CM | POA: Diagnosis not present

## 2018-03-10 DIAGNOSIS — I5030 Unspecified diastolic (congestive) heart failure: Secondary | ICD-10-CM | POA: Diagnosis not present

## 2018-03-10 DIAGNOSIS — S81802D Unspecified open wound, left lower leg, subsequent encounter: Secondary | ICD-10-CM | POA: Diagnosis not present

## 2018-03-10 DIAGNOSIS — E119 Type 2 diabetes mellitus without complications: Secondary | ICD-10-CM | POA: Diagnosis not present

## 2018-03-10 DIAGNOSIS — S81801D Unspecified open wound, right lower leg, subsequent encounter: Secondary | ICD-10-CM | POA: Diagnosis not present

## 2018-03-10 DIAGNOSIS — I110 Hypertensive heart disease with heart failure: Secondary | ICD-10-CM | POA: Diagnosis not present

## 2018-03-13 DIAGNOSIS — E119 Type 2 diabetes mellitus without complications: Secondary | ICD-10-CM | POA: Diagnosis not present

## 2018-03-13 DIAGNOSIS — I5030 Unspecified diastolic (congestive) heart failure: Secondary | ICD-10-CM | POA: Diagnosis not present

## 2018-03-13 DIAGNOSIS — J449 Chronic obstructive pulmonary disease, unspecified: Secondary | ICD-10-CM | POA: Diagnosis not present

## 2018-03-13 DIAGNOSIS — S81802D Unspecified open wound, left lower leg, subsequent encounter: Secondary | ICD-10-CM | POA: Diagnosis not present

## 2018-03-13 DIAGNOSIS — S81801D Unspecified open wound, right lower leg, subsequent encounter: Secondary | ICD-10-CM | POA: Diagnosis not present

## 2018-03-13 DIAGNOSIS — I110 Hypertensive heart disease with heart failure: Secondary | ICD-10-CM | POA: Diagnosis not present

## 2018-03-16 DIAGNOSIS — I5030 Unspecified diastolic (congestive) heart failure: Secondary | ICD-10-CM | POA: Diagnosis not present

## 2018-03-16 DIAGNOSIS — I110 Hypertensive heart disease with heart failure: Secondary | ICD-10-CM | POA: Diagnosis not present

## 2018-03-16 DIAGNOSIS — S81802D Unspecified open wound, left lower leg, subsequent encounter: Secondary | ICD-10-CM | POA: Diagnosis not present

## 2018-03-16 DIAGNOSIS — J449 Chronic obstructive pulmonary disease, unspecified: Secondary | ICD-10-CM | POA: Diagnosis not present

## 2018-03-16 DIAGNOSIS — S81801D Unspecified open wound, right lower leg, subsequent encounter: Secondary | ICD-10-CM | POA: Diagnosis not present

## 2018-03-16 DIAGNOSIS — E119 Type 2 diabetes mellitus without complications: Secondary | ICD-10-CM | POA: Diagnosis not present

## 2018-03-20 DIAGNOSIS — S81802D Unspecified open wound, left lower leg, subsequent encounter: Secondary | ICD-10-CM | POA: Diagnosis not present

## 2018-03-20 DIAGNOSIS — I110 Hypertensive heart disease with heart failure: Secondary | ICD-10-CM | POA: Diagnosis not present

## 2018-03-20 DIAGNOSIS — I5030 Unspecified diastolic (congestive) heart failure: Secondary | ICD-10-CM | POA: Diagnosis not present

## 2018-03-20 DIAGNOSIS — J449 Chronic obstructive pulmonary disease, unspecified: Secondary | ICD-10-CM | POA: Diagnosis not present

## 2018-03-20 DIAGNOSIS — S81801D Unspecified open wound, right lower leg, subsequent encounter: Secondary | ICD-10-CM | POA: Diagnosis not present

## 2018-03-20 DIAGNOSIS — E119 Type 2 diabetes mellitus without complications: Secondary | ICD-10-CM | POA: Diagnosis not present

## 2018-03-21 DIAGNOSIS — E1121 Type 2 diabetes mellitus with diabetic nephropathy: Secondary | ICD-10-CM | POA: Diagnosis not present

## 2018-03-21 DIAGNOSIS — Z6834 Body mass index (BMI) 34.0-34.9, adult: Secondary | ICD-10-CM | POA: Diagnosis not present

## 2018-03-23 DIAGNOSIS — Z87891 Personal history of nicotine dependence: Secondary | ICD-10-CM | POA: Diagnosis not present

## 2018-03-23 DIAGNOSIS — S81801D Unspecified open wound, right lower leg, subsequent encounter: Secondary | ICD-10-CM | POA: Diagnosis not present

## 2018-03-23 DIAGNOSIS — R238 Other skin changes: Secondary | ICD-10-CM | POA: Diagnosis not present

## 2018-03-23 DIAGNOSIS — D6489 Other specified anemias: Secondary | ICD-10-CM | POA: Diagnosis not present

## 2018-03-23 DIAGNOSIS — D530 Protein deficiency anemia: Secondary | ICD-10-CM | POA: Diagnosis not present

## 2018-03-23 DIAGNOSIS — E119 Type 2 diabetes mellitus without complications: Secondary | ICD-10-CM | POA: Diagnosis not present

## 2018-03-23 DIAGNOSIS — Z950 Presence of cardiac pacemaker: Secondary | ICD-10-CM | POA: Diagnosis not present

## 2018-03-23 DIAGNOSIS — Z794 Long term (current) use of insulin: Secondary | ICD-10-CM | POA: Diagnosis not present

## 2018-03-23 DIAGNOSIS — I110 Hypertensive heart disease with heart failure: Secondary | ICD-10-CM | POA: Diagnosis not present

## 2018-03-23 DIAGNOSIS — Z742 Need for assistance at home and no other household member able to render care: Secondary | ICD-10-CM | POA: Diagnosis not present

## 2018-03-23 DIAGNOSIS — I5030 Unspecified diastolic (congestive) heart failure: Secondary | ICD-10-CM | POA: Diagnosis not present

## 2018-03-23 DIAGNOSIS — Z4800 Encounter for change or removal of nonsurgical wound dressing: Secondary | ICD-10-CM | POA: Diagnosis not present

## 2018-03-23 DIAGNOSIS — Z5181 Encounter for therapeutic drug level monitoring: Secondary | ICD-10-CM | POA: Diagnosis not present

## 2018-03-23 DIAGNOSIS — J449 Chronic obstructive pulmonary disease, unspecified: Secondary | ICD-10-CM | POA: Diagnosis not present

## 2018-03-23 DIAGNOSIS — M48061 Spinal stenosis, lumbar region without neurogenic claudication: Secondary | ICD-10-CM | POA: Diagnosis not present

## 2018-03-23 DIAGNOSIS — G4733 Obstructive sleep apnea (adult) (pediatric): Secondary | ICD-10-CM | POA: Diagnosis not present

## 2018-03-23 DIAGNOSIS — S81802D Unspecified open wound, left lower leg, subsequent encounter: Secondary | ICD-10-CM | POA: Diagnosis not present

## 2018-03-28 DIAGNOSIS — S81801D Unspecified open wound, right lower leg, subsequent encounter: Secondary | ICD-10-CM | POA: Diagnosis not present

## 2018-03-28 DIAGNOSIS — R238 Other skin changes: Secondary | ICD-10-CM | POA: Diagnosis not present

## 2018-03-28 DIAGNOSIS — S81802D Unspecified open wound, left lower leg, subsequent encounter: Secondary | ICD-10-CM | POA: Diagnosis not present

## 2018-03-28 DIAGNOSIS — E119 Type 2 diabetes mellitus without complications: Secondary | ICD-10-CM | POA: Diagnosis not present

## 2018-03-28 DIAGNOSIS — I110 Hypertensive heart disease with heart failure: Secondary | ICD-10-CM | POA: Diagnosis not present

## 2018-03-28 DIAGNOSIS — I5030 Unspecified diastolic (congestive) heart failure: Secondary | ICD-10-CM | POA: Diagnosis not present

## 2018-04-04 DIAGNOSIS — E119 Type 2 diabetes mellitus without complications: Secondary | ICD-10-CM | POA: Diagnosis not present

## 2018-04-04 DIAGNOSIS — I5030 Unspecified diastolic (congestive) heart failure: Secondary | ICD-10-CM | POA: Diagnosis not present

## 2018-04-04 DIAGNOSIS — I110 Hypertensive heart disease with heart failure: Secondary | ICD-10-CM | POA: Diagnosis not present

## 2018-04-04 DIAGNOSIS — R238 Other skin changes: Secondary | ICD-10-CM | POA: Diagnosis not present

## 2018-04-04 DIAGNOSIS — S81802D Unspecified open wound, left lower leg, subsequent encounter: Secondary | ICD-10-CM | POA: Diagnosis not present

## 2018-04-04 DIAGNOSIS — S81801D Unspecified open wound, right lower leg, subsequent encounter: Secondary | ICD-10-CM | POA: Diagnosis not present

## 2018-04-11 DIAGNOSIS — E119 Type 2 diabetes mellitus without complications: Secondary | ICD-10-CM | POA: Diagnosis not present

## 2018-04-11 DIAGNOSIS — R238 Other skin changes: Secondary | ICD-10-CM | POA: Diagnosis not present

## 2018-04-11 DIAGNOSIS — S81802D Unspecified open wound, left lower leg, subsequent encounter: Secondary | ICD-10-CM | POA: Diagnosis not present

## 2018-04-11 DIAGNOSIS — I110 Hypertensive heart disease with heart failure: Secondary | ICD-10-CM | POA: Diagnosis not present

## 2018-04-11 DIAGNOSIS — S81801D Unspecified open wound, right lower leg, subsequent encounter: Secondary | ICD-10-CM | POA: Diagnosis not present

## 2018-04-11 DIAGNOSIS — I5030 Unspecified diastolic (congestive) heart failure: Secondary | ICD-10-CM | POA: Diagnosis not present

## 2018-04-12 DIAGNOSIS — I110 Hypertensive heart disease with heart failure: Secondary | ICD-10-CM | POA: Diagnosis not present

## 2018-04-12 DIAGNOSIS — S81802D Unspecified open wound, left lower leg, subsequent encounter: Secondary | ICD-10-CM | POA: Diagnosis not present

## 2018-04-12 DIAGNOSIS — I5030 Unspecified diastolic (congestive) heart failure: Secondary | ICD-10-CM | POA: Diagnosis not present

## 2018-04-12 DIAGNOSIS — E119 Type 2 diabetes mellitus without complications: Secondary | ICD-10-CM | POA: Diagnosis not present

## 2018-04-12 DIAGNOSIS — R238 Other skin changes: Secondary | ICD-10-CM | POA: Diagnosis not present

## 2018-04-12 DIAGNOSIS — S81801D Unspecified open wound, right lower leg, subsequent encounter: Secondary | ICD-10-CM | POA: Diagnosis not present

## 2018-04-18 DIAGNOSIS — I5030 Unspecified diastolic (congestive) heart failure: Secondary | ICD-10-CM | POA: Diagnosis not present

## 2018-04-18 DIAGNOSIS — I110 Hypertensive heart disease with heart failure: Secondary | ICD-10-CM | POA: Diagnosis not present

## 2018-04-18 DIAGNOSIS — S81801D Unspecified open wound, right lower leg, subsequent encounter: Secondary | ICD-10-CM | POA: Diagnosis not present

## 2018-04-18 DIAGNOSIS — S81802D Unspecified open wound, left lower leg, subsequent encounter: Secondary | ICD-10-CM | POA: Diagnosis not present

## 2018-04-18 DIAGNOSIS — R238 Other skin changes: Secondary | ICD-10-CM | POA: Diagnosis not present

## 2018-04-18 DIAGNOSIS — E119 Type 2 diabetes mellitus without complications: Secondary | ICD-10-CM | POA: Diagnosis not present

## 2018-04-25 DIAGNOSIS — S81801D Unspecified open wound, right lower leg, subsequent encounter: Secondary | ICD-10-CM | POA: Diagnosis not present

## 2018-04-25 DIAGNOSIS — S81802D Unspecified open wound, left lower leg, subsequent encounter: Secondary | ICD-10-CM | POA: Diagnosis not present

## 2018-04-25 DIAGNOSIS — R238 Other skin changes: Secondary | ICD-10-CM | POA: Diagnosis not present

## 2018-04-25 DIAGNOSIS — E119 Type 2 diabetes mellitus without complications: Secondary | ICD-10-CM | POA: Diagnosis not present

## 2018-04-25 DIAGNOSIS — I5030 Unspecified diastolic (congestive) heart failure: Secondary | ICD-10-CM | POA: Diagnosis not present

## 2018-04-25 DIAGNOSIS — I110 Hypertensive heart disease with heart failure: Secondary | ICD-10-CM | POA: Diagnosis not present

## 2018-04-27 ENCOUNTER — Ambulatory Visit (INDEPENDENT_AMBULATORY_CARE_PROVIDER_SITE_OTHER): Payer: Medicare Other | Admitting: *Deleted

## 2018-04-27 DIAGNOSIS — I495 Sick sinus syndrome: Secondary | ICD-10-CM

## 2018-04-27 NOTE — Progress Notes (Signed)
Remote pacemaker transmission.   

## 2018-05-02 ENCOUNTER — Telehealth: Payer: Self-pay | Admitting: *Deleted

## 2018-05-02 DIAGNOSIS — S81802D Unspecified open wound, left lower leg, subsequent encounter: Secondary | ICD-10-CM | POA: Diagnosis not present

## 2018-05-02 DIAGNOSIS — I5030 Unspecified diastolic (congestive) heart failure: Secondary | ICD-10-CM | POA: Diagnosis not present

## 2018-05-02 DIAGNOSIS — I110 Hypertensive heart disease with heart failure: Secondary | ICD-10-CM | POA: Diagnosis not present

## 2018-05-02 DIAGNOSIS — S81801D Unspecified open wound, right lower leg, subsequent encounter: Secondary | ICD-10-CM | POA: Diagnosis not present

## 2018-05-02 DIAGNOSIS — E119 Type 2 diabetes mellitus without complications: Secondary | ICD-10-CM | POA: Diagnosis not present

## 2018-05-02 DIAGNOSIS — R238 Other skin changes: Secondary | ICD-10-CM | POA: Diagnosis not present

## 2018-05-02 NOTE — Telephone Encounter (Signed)
HH nurse wanting to know if pt can wear the compressions stockings for the 5 days weekly and leave them on at night time - has a hard time getting them on and off every night

## 2018-05-02 NOTE — Telephone Encounter (Signed)
If he wears during the day I would recommend taking them off at night to avoid skin breakdown   Dominga FerryJ Corita Allinson MD

## 2018-05-02 NOTE — Telephone Encounter (Signed)
HH nurse Tammy made aware and was with pt

## 2018-05-09 DIAGNOSIS — E119 Type 2 diabetes mellitus without complications: Secondary | ICD-10-CM | POA: Diagnosis not present

## 2018-05-09 DIAGNOSIS — R238 Other skin changes: Secondary | ICD-10-CM | POA: Diagnosis not present

## 2018-05-09 DIAGNOSIS — I5030 Unspecified diastolic (congestive) heart failure: Secondary | ICD-10-CM | POA: Diagnosis not present

## 2018-05-09 DIAGNOSIS — I110 Hypertensive heart disease with heart failure: Secondary | ICD-10-CM | POA: Diagnosis not present

## 2018-05-09 DIAGNOSIS — S81802D Unspecified open wound, left lower leg, subsequent encounter: Secondary | ICD-10-CM | POA: Diagnosis not present

## 2018-05-09 DIAGNOSIS — S81801D Unspecified open wound, right lower leg, subsequent encounter: Secondary | ICD-10-CM | POA: Diagnosis not present

## 2018-05-17 DIAGNOSIS — E119 Type 2 diabetes mellitus without complications: Secondary | ICD-10-CM | POA: Diagnosis not present

## 2018-05-17 DIAGNOSIS — I5030 Unspecified diastolic (congestive) heart failure: Secondary | ICD-10-CM | POA: Diagnosis not present

## 2018-05-17 DIAGNOSIS — S81801D Unspecified open wound, right lower leg, subsequent encounter: Secondary | ICD-10-CM | POA: Diagnosis not present

## 2018-05-17 DIAGNOSIS — I110 Hypertensive heart disease with heart failure: Secondary | ICD-10-CM | POA: Diagnosis not present

## 2018-05-17 DIAGNOSIS — S81802D Unspecified open wound, left lower leg, subsequent encounter: Secondary | ICD-10-CM | POA: Diagnosis not present

## 2018-05-17 DIAGNOSIS — R238 Other skin changes: Secondary | ICD-10-CM | POA: Diagnosis not present

## 2018-05-22 DIAGNOSIS — Z950 Presence of cardiac pacemaker: Secondary | ICD-10-CM | POA: Diagnosis not present

## 2018-05-22 DIAGNOSIS — Z4800 Encounter for change or removal of nonsurgical wound dressing: Secondary | ICD-10-CM | POA: Diagnosis not present

## 2018-05-22 DIAGNOSIS — Z87891 Personal history of nicotine dependence: Secondary | ICD-10-CM | POA: Diagnosis not present

## 2018-05-22 DIAGNOSIS — E119 Type 2 diabetes mellitus without complications: Secondary | ICD-10-CM | POA: Diagnosis not present

## 2018-05-22 DIAGNOSIS — S81801D Unspecified open wound, right lower leg, subsequent encounter: Secondary | ICD-10-CM | POA: Diagnosis not present

## 2018-05-22 DIAGNOSIS — D6489 Other specified anemias: Secondary | ICD-10-CM | POA: Diagnosis not present

## 2018-05-22 DIAGNOSIS — I5030 Unspecified diastolic (congestive) heart failure: Secondary | ICD-10-CM | POA: Diagnosis not present

## 2018-05-22 DIAGNOSIS — S81802D Unspecified open wound, left lower leg, subsequent encounter: Secondary | ICD-10-CM | POA: Diagnosis not present

## 2018-05-22 DIAGNOSIS — D530 Protein deficiency anemia: Secondary | ICD-10-CM | POA: Diagnosis not present

## 2018-05-22 DIAGNOSIS — R238 Other skin changes: Secondary | ICD-10-CM | POA: Diagnosis not present

## 2018-05-22 DIAGNOSIS — Z794 Long term (current) use of insulin: Secondary | ICD-10-CM | POA: Diagnosis not present

## 2018-05-22 DIAGNOSIS — M48061 Spinal stenosis, lumbar region without neurogenic claudication: Secondary | ICD-10-CM | POA: Diagnosis not present

## 2018-05-22 DIAGNOSIS — G4733 Obstructive sleep apnea (adult) (pediatric): Secondary | ICD-10-CM | POA: Diagnosis not present

## 2018-05-22 DIAGNOSIS — J449 Chronic obstructive pulmonary disease, unspecified: Secondary | ICD-10-CM | POA: Diagnosis not present

## 2018-05-22 DIAGNOSIS — I110 Hypertensive heart disease with heart failure: Secondary | ICD-10-CM | POA: Diagnosis not present

## 2018-05-22 DIAGNOSIS — Z5181 Encounter for therapeutic drug level monitoring: Secondary | ICD-10-CM | POA: Diagnosis not present

## 2018-05-22 DIAGNOSIS — Z742 Need for assistance at home and no other household member able to render care: Secondary | ICD-10-CM | POA: Diagnosis not present

## 2018-05-23 DIAGNOSIS — S81801D Unspecified open wound, right lower leg, subsequent encounter: Secondary | ICD-10-CM | POA: Diagnosis not present

## 2018-05-23 DIAGNOSIS — R238 Other skin changes: Secondary | ICD-10-CM | POA: Diagnosis not present

## 2018-05-23 DIAGNOSIS — I110 Hypertensive heart disease with heart failure: Secondary | ICD-10-CM | POA: Diagnosis not present

## 2018-05-23 DIAGNOSIS — I5030 Unspecified diastolic (congestive) heart failure: Secondary | ICD-10-CM | POA: Diagnosis not present

## 2018-05-23 DIAGNOSIS — S81802D Unspecified open wound, left lower leg, subsequent encounter: Secondary | ICD-10-CM | POA: Diagnosis not present

## 2018-05-23 DIAGNOSIS — E119 Type 2 diabetes mellitus without complications: Secondary | ICD-10-CM | POA: Diagnosis not present

## 2018-05-27 DIAGNOSIS — R5381 Other malaise: Secondary | ICD-10-CM | POA: Diagnosis not present

## 2018-05-27 DIAGNOSIS — R69 Illness, unspecified: Secondary | ICD-10-CM | POA: Diagnosis not present

## 2018-05-28 LAB — CUP PACEART REMOTE DEVICE CHECK
Battery Remaining Longevity: 18 mo
Brady Statistic AP VP Percent: 82 %
Brady Statistic AP VS Percent: 0 %
Brady Statistic AS VP Percent: 18 %
Brady Statistic AS VS Percent: 0 %
Implantable Lead Implant Date: 19960418
Implantable Lead Implant Date: 19960418
Implantable Lead Location: 753859
Implantable Lead Model: 5534
Lead Channel Impedance Value: 1423 Ohm
Lead Channel Impedance Value: 999 Ohm
Lead Channel Pacing Threshold Amplitude: 0.625 V
Lead Channel Pacing Threshold Amplitude: 1.75 V
Lead Channel Pacing Threshold Pulse Width: 0.4 ms
Lead Channel Pacing Threshold Pulse Width: 0.4 ms
Lead Channel Setting Pacing Amplitude: 2 V
MDC IDC LEAD LOCATION: 753860
MDC IDC MSMT BATTERY IMPEDANCE: 3227 Ohm
MDC IDC MSMT BATTERY VOLTAGE: 2.72 V
MDC IDC PG IMPLANT DT: 20081211
MDC IDC SESS DTM: 20190709181300
MDC IDC SET LEADCHNL RV PACING AMPLITUDE: 2.5 V
MDC IDC SET LEADCHNL RV PACING PULSEWIDTH: 0.76 ms
MDC IDC SET LEADCHNL RV SENSING SENSITIVITY: 2 mV

## 2018-05-31 DIAGNOSIS — S81802D Unspecified open wound, left lower leg, subsequent encounter: Secondary | ICD-10-CM | POA: Diagnosis not present

## 2018-05-31 DIAGNOSIS — I110 Hypertensive heart disease with heart failure: Secondary | ICD-10-CM | POA: Diagnosis not present

## 2018-05-31 DIAGNOSIS — I5030 Unspecified diastolic (congestive) heart failure: Secondary | ICD-10-CM | POA: Diagnosis not present

## 2018-05-31 DIAGNOSIS — E119 Type 2 diabetes mellitus without complications: Secondary | ICD-10-CM | POA: Diagnosis not present

## 2018-05-31 DIAGNOSIS — R238 Other skin changes: Secondary | ICD-10-CM | POA: Diagnosis not present

## 2018-05-31 DIAGNOSIS — S81801D Unspecified open wound, right lower leg, subsequent encounter: Secondary | ICD-10-CM | POA: Diagnosis not present

## 2018-06-01 DIAGNOSIS — I110 Hypertensive heart disease with heart failure: Secondary | ICD-10-CM | POA: Diagnosis not present

## 2018-06-01 DIAGNOSIS — S81801D Unspecified open wound, right lower leg, subsequent encounter: Secondary | ICD-10-CM | POA: Diagnosis not present

## 2018-06-01 DIAGNOSIS — I5030 Unspecified diastolic (congestive) heart failure: Secondary | ICD-10-CM | POA: Diagnosis not present

## 2018-06-01 DIAGNOSIS — E119 Type 2 diabetes mellitus without complications: Secondary | ICD-10-CM | POA: Diagnosis not present

## 2018-06-01 DIAGNOSIS — S81802D Unspecified open wound, left lower leg, subsequent encounter: Secondary | ICD-10-CM | POA: Diagnosis not present

## 2018-06-01 DIAGNOSIS — R238 Other skin changes: Secondary | ICD-10-CM | POA: Diagnosis not present

## 2018-06-06 DIAGNOSIS — E119 Type 2 diabetes mellitus without complications: Secondary | ICD-10-CM | POA: Diagnosis not present

## 2018-06-06 DIAGNOSIS — I110 Hypertensive heart disease with heart failure: Secondary | ICD-10-CM | POA: Diagnosis not present

## 2018-06-06 DIAGNOSIS — R238 Other skin changes: Secondary | ICD-10-CM | POA: Diagnosis not present

## 2018-06-06 DIAGNOSIS — S81802D Unspecified open wound, left lower leg, subsequent encounter: Secondary | ICD-10-CM | POA: Diagnosis not present

## 2018-06-06 DIAGNOSIS — I5030 Unspecified diastolic (congestive) heart failure: Secondary | ICD-10-CM | POA: Diagnosis not present

## 2018-06-06 DIAGNOSIS — S81801D Unspecified open wound, right lower leg, subsequent encounter: Secondary | ICD-10-CM | POA: Diagnosis not present

## 2018-06-13 DIAGNOSIS — I110 Hypertensive heart disease with heart failure: Secondary | ICD-10-CM | POA: Diagnosis not present

## 2018-06-13 DIAGNOSIS — S81802D Unspecified open wound, left lower leg, subsequent encounter: Secondary | ICD-10-CM | POA: Diagnosis not present

## 2018-06-13 DIAGNOSIS — S81801D Unspecified open wound, right lower leg, subsequent encounter: Secondary | ICD-10-CM | POA: Diagnosis not present

## 2018-06-13 DIAGNOSIS — R238 Other skin changes: Secondary | ICD-10-CM | POA: Diagnosis not present

## 2018-06-13 DIAGNOSIS — I5030 Unspecified diastolic (congestive) heart failure: Secondary | ICD-10-CM | POA: Diagnosis not present

## 2018-06-13 DIAGNOSIS — E119 Type 2 diabetes mellitus without complications: Secondary | ICD-10-CM | POA: Diagnosis not present

## 2018-06-20 ENCOUNTER — Ambulatory Visit (INDEPENDENT_AMBULATORY_CARE_PROVIDER_SITE_OTHER): Payer: Medicare Other | Admitting: Cardiology

## 2018-06-20 ENCOUNTER — Encounter: Payer: Self-pay | Admitting: Cardiology

## 2018-06-20 VITALS — BP 132/74 | HR 75 | Ht 72.0 in | Wt 264.6 lb

## 2018-06-20 DIAGNOSIS — I5033 Acute on chronic diastolic (congestive) heart failure: Secondary | ICD-10-CM

## 2018-06-20 DIAGNOSIS — R238 Other skin changes: Secondary | ICD-10-CM | POA: Diagnosis not present

## 2018-06-20 DIAGNOSIS — S81801D Unspecified open wound, right lower leg, subsequent encounter: Secondary | ICD-10-CM | POA: Diagnosis not present

## 2018-06-20 DIAGNOSIS — I10 Essential (primary) hypertension: Secondary | ICD-10-CM

## 2018-06-20 DIAGNOSIS — S81802D Unspecified open wound, left lower leg, subsequent encounter: Secondary | ICD-10-CM | POA: Diagnosis not present

## 2018-06-20 DIAGNOSIS — I4892 Unspecified atrial flutter: Secondary | ICD-10-CM | POA: Diagnosis not present

## 2018-06-20 DIAGNOSIS — I110 Hypertensive heart disease with heart failure: Secondary | ICD-10-CM | POA: Diagnosis not present

## 2018-06-20 DIAGNOSIS — I5030 Unspecified diastolic (congestive) heart failure: Secondary | ICD-10-CM | POA: Diagnosis not present

## 2018-06-20 DIAGNOSIS — E119 Type 2 diabetes mellitus without complications: Secondary | ICD-10-CM | POA: Diagnosis not present

## 2018-06-20 DIAGNOSIS — I495 Sick sinus syndrome: Secondary | ICD-10-CM

## 2018-06-20 MED ORDER — TORSEMIDE 20 MG PO TABS: 60.0000 mg | ORAL_TABLET | Freq: Every day | ORAL | 3 refills | Status: DC

## 2018-06-20 NOTE — Patient Instructions (Signed)
Your physician recommends that you schedule a follow-up appointment in: 2 MONTHS WITH DR [facility] AND 1 WEEK FOR WEIGHT AND VITALS CHECK WITH THE NURSE   Your physician has recommended you make the following change in your medication:   INCREASE TORSEMIDE 60 MG (3 TABLETS DAILY)   Your physician recommends that you return for lab work in: 2 WEEKS BMP/MG  Thank you for choosing  [facility]!!

## 2018-06-20 NOTE — Progress Notes (Signed)
Clinical Summary Alan Briggs is a 84 y.o.male seen today for follow up of the following medical problems.   1. /Acute on chronic diastolic HF - compliant with diuretics.  - advanced home care with intermittent leg wrapping, also intermittent compression stockins which seem to make his legs weep - receint in crease in swelling.    2.Sick sinus syndrome - history of pacemaker placement in 1996. Replaced in 2008 - previously followed at [facility]. From there notes he has a MDT Adapta DR device placed in 2008.    - normal device check normal function. Followed by Dr Ladona Ridgel.    3. HTN -compliant with meds  4. OSA - history of OSA, has not wanted to wear CPAP  6. Aflutter - noted on prior device checks - frequent falls, has not been started on anticoag.Also history of GI bleed  - denies any symptoms.    7. History of GI bleed - admit 04/2017 with GI bleed  Past Medical History:  Diagnosis Date  . Cardiac pacemaker in situ    for sick sinus syndrome-last placement was 09/2007 [facility]  . Diabetes [facility])   . GERD (gastroesophageal reflux disease)   . Mixed hyperlipidemia   . Obstructive lung disease (HCC)    non compliant with home O2  . Pulmonary hypertension (HCC)   . Sleep apnea   . Thrombocytopenia (HCC)   . Venous insufficiency (chronic) (peripheral)      Allergies  Allergen Reactions  . Advair Diskus [Fluticasone-Salmeterol] Other (See Comments)    Reaction:  Unknown   . Combivent [Ipratropium-Albuterol] Other (See Comments)    Reaction:  Unknown   . Lotensin Hct [Benazepril-Hydrochlorothiazide] Other (See Comments)    Reaction:  Unknown   . Penicillins Swelling and Other (See Comments)    Reaction:  Facial swelling Has patient had a PCN reaction causing immediate rash, facial/tongue/throat swelling, SOB or lightheadedness with hypotension: Yes Has patient had a PCN reaction causing severe rash involving mucus membranes or skin necrosis:  No Has patient had a PCN reaction that required hospitalization No Has patient had a PCN reaction occurring within the last 10 years: No If all of the above answers are "NO", then may proceed with Cephalosporin use.  . Simvastatin Other (See Comments)    Reaction:  Muscle pain      Current Outpatient Medications  Medication Sig Dispense Refill  . aspirin EC 81 MG tablet Take 81 mg by mouth daily.    Marland Kitchen atenolol (TENORMIN) 25 MG tablet Take 1 tablet (25 mg total) by mouth daily. 90 tablet 3  . insulin glargine (LANTUS) 100 UNIT/ML injection Inject 0.1 mLs (10 Units total) into the skin at bedtime. (Patient taking differently: Inject 12 Units into the skin at bedtime. ) 10 mL 11  . meclizine (ANTIVERT) 25 MG tablet Take 1 tablet (25 mg total) by mouth every 8 (eight) hours as needed for dizziness. 90 tablet 1  . Multiple Vitamin (MULTIVITAMIN WITH MINERALS) TABS tablet Take 1 tablet by mouth daily.    . Multiple Vitamins-Minerals (ICAPS AREDS 2) CAPS Take 1 capsule by mouth daily.    Marland Kitchen torsemide (DEMADEX) 20 MG tablet Take 40 mg by mouth daily.     No current facility-administered medications for this visit.      Past Surgical History:  Procedure Laterality Date  . CATARACT EXTRACTION, BILATERAL Bilateral   . COLONOSCOPY N/A 04/24/2017   Procedure: COLONOSCOPY;  Surgeon: Corbin Ade, MD;  [facility];  Service: Endoscopy;  Laterality: N/A;  . ESOPHAGOGASTRODUODENOSCOPY N/A 04/24/2017   Procedure: ESOPHAGOGASTRODUODENOSCOPY (EGD);  Surgeon: Corbin Ade, MD;  [facility];  Service: Endoscopy;  Laterality: N/A;  . KIDNEY STONE SURGERY Left    laser ablation   . PACEMAKER IMPLANT    . PERCUTANEOUS PINNING Right 04/20/2016   Procedure: IRRIGATION AND DEBRIDEMENT RIGHT FOOT, PERCUTANEOUS PINNING SMALL TOE;  Surgeon: Tarry Kos, MD;  Location: [facility] OR;  Service: Orthopedics;  Laterality: Right;  . REFRACTIVE SURGERY Right   . ROTATOR CUFF REPAIR Left       Allergies  Allergen Reactions  . Advair Diskus [Fluticasone-Salmeterol] Other (See Comments)    Reaction:  Unknown   . Combivent [Ipratropium-Albuterol] Other (See Comments)    Reaction:  Unknown   . Lotensin Hct [Benazepril-Hydrochlorothiazide] Other (See Comments)    Reaction:  Unknown   . Penicillins Swelling and Other (See Comments)    Reaction:  Facial swelling Has patient had a PCN reaction causing immediate rash, facial/tongue/throat swelling, SOB or lightheadedness with hypotension: Yes Has patient had a PCN reaction causing severe rash involving mucus membranes or skin necrosis: No Has patient had a PCN reaction that required hospitalization No Has patient had a PCN reaction occurring within the last 10 years: No If all of the above answers are "NO", then may proceed with Cephalosporin use.  . Simvastatin Other (See Comments)    Reaction:  Muscle pain       Family History  Problem Relation Age of Onset  . Diabetes Mother   . Colon cancer Neg Hx   . Colon polyps Neg Hx      Social History Alan Briggs reports that he quit smoking about 39 years ago. His smoking use included cigarettes. He started smoking about 69 years ago. He has a 15.00 pack-year smoking history. He has never used smokeless tobacco. Alan Briggs reports that he does not drink alcohol.   Review of Systems CONSTITUTIONAL: No weight loss, fever, chills, weakness or fatigue.  HEENT: Eyes: No visual loss, blurred vision, double vision or yellow sclerae.No hearing loss, sneezing, congestion, runny nose or sore throat.  SKIN: No rash or itching.  CARDIOVASCULAR: per hpi RESPIRATORY: No shortness of breath, cough or sputum.  GASTROINTESTINAL: No anorexia, nausea, vomiting or diarrhea. No abdominal pain or blood.  GENITOURINARY: No burning on urination, no polyuria NEUROLOGICAL: No headache, dizziness, syncope, paralysis, ataxia, numbness or tingling in the extremities. No change in bowel or bladder  control.  MUSCULOSKELETAL: No muscle, back pain, joint pain or stiffness.  LYMPHATICS: No enlarged nodes. No history of splenectomy.  PSYCHIATRIC: No history of depression or anxiety.  ENDOCRINOLOGIC: No reports of sweating, cold or heat intolerance. No polyuria or polydipsia.  Marland Kitchen   Physical Examination Vitals:   06/20/18 1348  BP: 132/74  Pulse: 75  SpO2: 95%   Vitals:   06/20/18 1348  Weight: 264 lb 9.6 oz (120 kg)  Height: 6' (1.829 m)    Gen: resting comfortably, no acute distress HEENT: no scleral icterus, pupils equal round and reactive, no palptable cervical adenopathy,  CV: RRR, no m/r/g, no jvd Resp: Clear to auscultation bilaterally GI: abdomen is soft, non-tender, non-distended, normal bowel sounds, no hepatosplenomegaly MSK: extremities are warm, 2+ bilateral LE edema Skin: warm, no rash Neuro:  no focal deficits Psych: appropriate affect    Assessment and Plan  1. Acute on chronic diastolic HF -worsening edema, significant weight gain - increase torsemide to 60mg  daily.  Check BMET/Mg in 2 weeks.    2. Sick sinus syndrome - continue to follow in device clinic.    3. HTN -at goal, continue current meds  4. Aflutter - no recent symptoms - not on anticoag due to history of frequent falls as well as recent GI bleed - continue to monitor.      Antoine Poche, M.D.

## 2018-06-27 ENCOUNTER — Telehealth: Payer: Self-pay | Admitting: Cardiology

## 2018-06-27 ENCOUNTER — Encounter: Payer: Self-pay | Admitting: *Deleted

## 2018-06-27 ENCOUNTER — Ambulatory Visit (INDEPENDENT_AMBULATORY_CARE_PROVIDER_SITE_OTHER): Payer: Medicare Other | Admitting: *Deleted

## 2018-06-27 VITALS — BP 119/75 | HR 70 | Ht 72.0 in | Wt 256.0 lb

## 2018-06-27 DIAGNOSIS — I5033 Acute on chronic diastolic (congestive) heart failure: Secondary | ICD-10-CM | POA: Diagnosis not present

## 2018-06-27 DIAGNOSIS — I5030 Unspecified diastolic (congestive) heart failure: Secondary | ICD-10-CM | POA: Diagnosis not present

## 2018-06-27 DIAGNOSIS — R238 Other skin changes: Secondary | ICD-10-CM | POA: Diagnosis not present

## 2018-06-27 DIAGNOSIS — S81801D Unspecified open wound, right lower leg, subsequent encounter: Secondary | ICD-10-CM | POA: Diagnosis not present

## 2018-06-27 DIAGNOSIS — I110 Hypertensive heart disease with heart failure: Secondary | ICD-10-CM | POA: Diagnosis not present

## 2018-06-27 DIAGNOSIS — E119 Type 2 diabetes mellitus without complications: Secondary | ICD-10-CM | POA: Diagnosis not present

## 2018-06-27 DIAGNOSIS — S81802D Unspecified open wound, left lower leg, subsequent encounter: Secondary | ICD-10-CM | POA: Diagnosis not present

## 2018-06-27 NOTE — Telephone Encounter (Signed)
Pt son wanting Dr Wyline Mood to be aware of pt eating habits that have changed - says he is eating mainly white bread/jellie/jams with pies and cakes - says pts neighbors and family have been buying him what he wants (says pt is stubborn and wants to eat what he likes) says pt has gained weight and thinks his eating habits are to blame and not fluid - pt is coming this afternoon for nurse visit and will forward to provider as Alan Briggs

## 2018-06-27 NOTE — Progress Notes (Signed)
Left message for patient to call office.  

## 2018-06-27 NOTE — Patient Instructions (Signed)
Medication Instructions:   Your physician recommends that you continue on your current medications as directed. Please refer to the Current Medication list given to you today.  Labwork:  Get requested lab work next week with home health or your primary care doctor.  Testing/Procedures:  NONE  Follow-Up:  Keep already scheduled follow up visit.  Any Other Special Instructions Will Be Listed Below (If Applicable).  If you need a refill on your cardiac medications before your next appointment, please call your pharmacy.

## 2018-06-27 NOTE — Progress Notes (Signed)
Present today for weight and vital sign check per last office visit. Patient said he has taken all doses of medications as prescribed. No side effects noted. No c/o dizziness, chest pain or sob. Medications reconciled during visit.

## 2018-06-27 NOTE — Telephone Encounter (Signed)
Spoke with son and informed him that lab work should be done this week instead of next week. Awaiting call back from Nonah Mattes to inform him.

## 2018-06-27 NOTE — Telephone Encounter (Signed)
Shon Baton (son) called requesting to speak with Dr. Verna Czech nurse in regards to his Nurse visit this afternoon.  [phone]

## 2018-06-27 NOTE — Progress Notes (Signed)
Son Laurie Dealmeida informed and lab orders faxed to [facility].

## 2018-06-27 NOTE — Telephone Encounter (Signed)
Patient returning Lydia's call

## 2018-06-27 NOTE — Progress Notes (Signed)
Weights are moving in the right direction, down 8 lbs since I saw him. Continue current dose of torsemide, can he go ahead and have his labs done   Dominga Ferry MD

## 2018-06-29 DIAGNOSIS — E119 Type 2 diabetes mellitus without complications: Secondary | ICD-10-CM | POA: Diagnosis not present

## 2018-06-29 DIAGNOSIS — I5030 Unspecified diastolic (congestive) heart failure: Secondary | ICD-10-CM | POA: Diagnosis not present

## 2018-06-29 DIAGNOSIS — Z5181 Encounter for therapeutic drug level monitoring: Secondary | ICD-10-CM | POA: Diagnosis not present

## 2018-06-29 DIAGNOSIS — R238 Other skin changes: Secondary | ICD-10-CM | POA: Diagnosis not present

## 2018-06-29 DIAGNOSIS — S81801D Unspecified open wound, right lower leg, subsequent encounter: Secondary | ICD-10-CM | POA: Diagnosis not present

## 2018-06-29 DIAGNOSIS — I110 Hypertensive heart disease with heart failure: Secondary | ICD-10-CM | POA: Diagnosis not present

## 2018-06-29 DIAGNOSIS — S81802D Unspecified open wound, left lower leg, subsequent encounter: Secondary | ICD-10-CM | POA: Diagnosis not present

## 2018-07-03 DIAGNOSIS — S81801D Unspecified open wound, right lower leg, subsequent encounter: Secondary | ICD-10-CM | POA: Diagnosis not present

## 2018-07-03 DIAGNOSIS — I5030 Unspecified diastolic (congestive) heart failure: Secondary | ICD-10-CM | POA: Diagnosis not present

## 2018-07-03 DIAGNOSIS — I110 Hypertensive heart disease with heart failure: Secondary | ICD-10-CM | POA: Diagnosis not present

## 2018-07-03 DIAGNOSIS — E119 Type 2 diabetes mellitus without complications: Secondary | ICD-10-CM | POA: Diagnosis not present

## 2018-07-03 DIAGNOSIS — S81802D Unspecified open wound, left lower leg, subsequent encounter: Secondary | ICD-10-CM | POA: Diagnosis not present

## 2018-07-03 DIAGNOSIS — R238 Other skin changes: Secondary | ICD-10-CM | POA: Diagnosis not present

## 2018-07-05 ENCOUNTER — Telehealth: Payer: Self-pay | Admitting: *Deleted

## 2018-07-05 MED ORDER — POTASSIUM CHLORIDE CRYS ER 20 MEQ PO TBCR: EXTENDED_RELEASE_TABLET | ORAL | 3 refills | Status: DC

## 2018-07-05 NOTE — Telephone Encounter (Signed)
-----   Message from Antoine PocheJonathan F Branch, MD sent at 07/03/2018 10:32 AM EDT ----- Labs show potassium is a little low,needs to take KCl 40mEq daily x 2 days, then 20mEq daily. How is his swelling doing?  Dominga FerryJ Branch MD

## 2018-07-05 NOTE — Telephone Encounter (Signed)
Pt says he doesn't have much swelling at this time - voiced understanding of potassium - [facility] [place] [facility]. Routed to pcp

## 2018-07-11 DIAGNOSIS — R238 Other skin changes: Secondary | ICD-10-CM | POA: Diagnosis not present

## 2018-07-11 DIAGNOSIS — S81801D Unspecified open wound, right lower leg, subsequent encounter: Secondary | ICD-10-CM | POA: Diagnosis not present

## 2018-07-11 DIAGNOSIS — S81802D Unspecified open wound, left lower leg, subsequent encounter: Secondary | ICD-10-CM | POA: Diagnosis not present

## 2018-07-11 DIAGNOSIS — E119 Type 2 diabetes mellitus without complications: Secondary | ICD-10-CM | POA: Diagnosis not present

## 2018-07-11 DIAGNOSIS — I5030 Unspecified diastolic (congestive) heart failure: Secondary | ICD-10-CM | POA: Diagnosis not present

## 2018-07-11 DIAGNOSIS — I110 Hypertensive heart disease with heart failure: Secondary | ICD-10-CM | POA: Diagnosis not present

## 2018-07-18 DIAGNOSIS — R238 Other skin changes: Secondary | ICD-10-CM | POA: Diagnosis not present

## 2018-07-18 DIAGNOSIS — E119 Type 2 diabetes mellitus without complications: Secondary | ICD-10-CM | POA: Diagnosis not present

## 2018-07-18 DIAGNOSIS — I5030 Unspecified diastolic (congestive) heart failure: Secondary | ICD-10-CM | POA: Diagnosis not present

## 2018-07-18 DIAGNOSIS — S81801D Unspecified open wound, right lower leg, subsequent encounter: Secondary | ICD-10-CM | POA: Diagnosis not present

## 2018-07-18 DIAGNOSIS — I110 Hypertensive heart disease with heart failure: Secondary | ICD-10-CM | POA: Diagnosis not present

## 2018-07-18 DIAGNOSIS — S81802D Unspecified open wound, left lower leg, subsequent encounter: Secondary | ICD-10-CM | POA: Diagnosis not present

## 2018-07-21 DIAGNOSIS — Z87891 Personal history of nicotine dependence: Secondary | ICD-10-CM | POA: Diagnosis not present

## 2018-07-21 DIAGNOSIS — E119 Type 2 diabetes mellitus without complications: Secondary | ICD-10-CM | POA: Diagnosis not present

## 2018-07-21 DIAGNOSIS — D6489 Other specified anemias: Secondary | ICD-10-CM | POA: Diagnosis not present

## 2018-07-21 DIAGNOSIS — Z742 Need for assistance at home and no other household member able to render care: Secondary | ICD-10-CM | POA: Diagnosis not present

## 2018-07-21 DIAGNOSIS — I5030 Unspecified diastolic (congestive) heart failure: Secondary | ICD-10-CM | POA: Diagnosis not present

## 2018-07-21 DIAGNOSIS — S81802D Unspecified open wound, left lower leg, subsequent encounter: Secondary | ICD-10-CM | POA: Diagnosis not present

## 2018-07-21 DIAGNOSIS — Z794 Long term (current) use of insulin: Secondary | ICD-10-CM | POA: Diagnosis not present

## 2018-07-21 DIAGNOSIS — I110 Hypertensive heart disease with heart failure: Secondary | ICD-10-CM | POA: Diagnosis not present

## 2018-07-21 DIAGNOSIS — R238 Other skin changes: Secondary | ICD-10-CM | POA: Diagnosis not present

## 2018-07-21 DIAGNOSIS — Z950 Presence of cardiac pacemaker: Secondary | ICD-10-CM | POA: Diagnosis not present

## 2018-07-21 DIAGNOSIS — D530 Protein deficiency anemia: Secondary | ICD-10-CM | POA: Diagnosis not present

## 2018-07-21 DIAGNOSIS — M48061 Spinal stenosis, lumbar region without neurogenic claudication: Secondary | ICD-10-CM | POA: Diagnosis not present

## 2018-07-21 DIAGNOSIS — G4733 Obstructive sleep apnea (adult) (pediatric): Secondary | ICD-10-CM | POA: Diagnosis not present

## 2018-07-21 DIAGNOSIS — Z5181 Encounter for therapeutic drug level monitoring: Secondary | ICD-10-CM | POA: Diagnosis not present

## 2018-07-21 DIAGNOSIS — S81801D Unspecified open wound, right lower leg, subsequent encounter: Secondary | ICD-10-CM | POA: Diagnosis not present

## 2018-07-21 DIAGNOSIS — Z4800 Encounter for change or removal of nonsurgical wound dressing: Secondary | ICD-10-CM | POA: Diagnosis not present

## 2018-07-21 DIAGNOSIS — J449 Chronic obstructive pulmonary disease, unspecified: Secondary | ICD-10-CM | POA: Diagnosis not present

## 2018-07-25 DIAGNOSIS — I5030 Unspecified diastolic (congestive) heart failure: Secondary | ICD-10-CM | POA: Diagnosis not present

## 2018-07-25 DIAGNOSIS — R238 Other skin changes: Secondary | ICD-10-CM | POA: Diagnosis not present

## 2018-07-25 DIAGNOSIS — S81802D Unspecified open wound, left lower leg, subsequent encounter: Secondary | ICD-10-CM | POA: Diagnosis not present

## 2018-07-25 DIAGNOSIS — I110 Hypertensive heart disease with heart failure: Secondary | ICD-10-CM | POA: Diagnosis not present

## 2018-07-25 DIAGNOSIS — S81801D Unspecified open wound, right lower leg, subsequent encounter: Secondary | ICD-10-CM | POA: Diagnosis not present

## 2018-07-25 DIAGNOSIS — E119 Type 2 diabetes mellitus without complications: Secondary | ICD-10-CM | POA: Diagnosis not present

## 2018-07-26 DIAGNOSIS — E119 Type 2 diabetes mellitus without complications: Secondary | ICD-10-CM | POA: Diagnosis not present

## 2018-07-26 DIAGNOSIS — D530 Protein deficiency anemia: Secondary | ICD-10-CM | POA: Diagnosis not present

## 2018-07-26 DIAGNOSIS — D6489 Other specified anemias: Secondary | ICD-10-CM | POA: Diagnosis not present

## 2018-07-26 DIAGNOSIS — M48061 Spinal stenosis, lumbar region without neurogenic claudication: Secondary | ICD-10-CM | POA: Diagnosis not present

## 2018-07-26 DIAGNOSIS — R238 Other skin changes: Secondary | ICD-10-CM | POA: Diagnosis not present

## 2018-07-26 DIAGNOSIS — S81801D Unspecified open wound, right lower leg, subsequent encounter: Secondary | ICD-10-CM | POA: Diagnosis not present

## 2018-07-26 DIAGNOSIS — J449 Chronic obstructive pulmonary disease, unspecified: Secondary | ICD-10-CM | POA: Diagnosis not present

## 2018-07-26 DIAGNOSIS — S81802D Unspecified open wound, left lower leg, subsequent encounter: Secondary | ICD-10-CM | POA: Diagnosis not present

## 2018-07-26 DIAGNOSIS — I5030 Unspecified diastolic (congestive) heart failure: Secondary | ICD-10-CM | POA: Diagnosis not present

## 2018-07-26 DIAGNOSIS — Z794 Long term (current) use of insulin: Secondary | ICD-10-CM | POA: Diagnosis not present

## 2018-07-26 DIAGNOSIS — G4733 Obstructive sleep apnea (adult) (pediatric): Secondary | ICD-10-CM | POA: Diagnosis not present

## 2018-07-26 DIAGNOSIS — I110 Hypertensive heart disease with heart failure: Secondary | ICD-10-CM | POA: Diagnosis not present

## 2018-07-27 ENCOUNTER — Ambulatory Visit (INDEPENDENT_AMBULATORY_CARE_PROVIDER_SITE_OTHER): Payer: Medicare Other | Admitting: *Deleted

## 2018-07-27 DIAGNOSIS — I495 Sick sinus syndrome: Secondary | ICD-10-CM

## 2018-07-27 NOTE — Progress Notes (Signed)
Remote pacemaker transmission.   

## 2018-07-31 DIAGNOSIS — S81802D Unspecified open wound, left lower leg, subsequent encounter: Secondary | ICD-10-CM | POA: Diagnosis not present

## 2018-07-31 DIAGNOSIS — I5030 Unspecified diastolic (congestive) heart failure: Secondary | ICD-10-CM | POA: Diagnosis not present

## 2018-07-31 DIAGNOSIS — S81801D Unspecified open wound, right lower leg, subsequent encounter: Secondary | ICD-10-CM | POA: Diagnosis not present

## 2018-07-31 DIAGNOSIS — R238 Other skin changes: Secondary | ICD-10-CM | POA: Diagnosis not present

## 2018-07-31 DIAGNOSIS — E119 Type 2 diabetes mellitus without complications: Secondary | ICD-10-CM | POA: Diagnosis not present

## 2018-07-31 DIAGNOSIS — I110 Hypertensive heart disease with heart failure: Secondary | ICD-10-CM | POA: Diagnosis not present

## 2018-08-01 DIAGNOSIS — E782 Mixed hyperlipidemia: Secondary | ICD-10-CM | POA: Diagnosis not present

## 2018-08-01 DIAGNOSIS — E1121 Type 2 diabetes mellitus with diabetic nephropathy: Secondary | ICD-10-CM | POA: Diagnosis not present

## 2018-08-01 DIAGNOSIS — I10 Essential (primary) hypertension: Secondary | ICD-10-CM | POA: Diagnosis not present

## 2018-08-01 DIAGNOSIS — I5030 Unspecified diastolic (congestive) heart failure: Secondary | ICD-10-CM | POA: Diagnosis not present

## 2018-08-01 DIAGNOSIS — L97909 Non-pressure chronic ulcer of unspecified part of unspecified lower leg with unspecified severity: Secondary | ICD-10-CM | POA: Diagnosis not present

## 2018-08-01 DIAGNOSIS — I5020 Unspecified systolic (congestive) heart failure: Secondary | ICD-10-CM | POA: Diagnosis not present

## 2018-08-01 DIAGNOSIS — I110 Hypertensive heart disease with heart failure: Secondary | ICD-10-CM | POA: Diagnosis not present

## 2018-08-01 DIAGNOSIS — Z0000 Encounter for general adult medical examination without abnormal findings: Secondary | ICD-10-CM | POA: Diagnosis not present

## 2018-08-01 DIAGNOSIS — E876 Hypokalemia: Secondary | ICD-10-CM | POA: Diagnosis not present

## 2018-08-01 DIAGNOSIS — G9009 Other idiopathic peripheral autonomic neuropathy: Secondary | ICD-10-CM | POA: Diagnosis not present

## 2018-08-01 DIAGNOSIS — E1140 Type 2 diabetes mellitus with diabetic neuropathy, unspecified: Secondary | ICD-10-CM | POA: Diagnosis not present

## 2018-08-01 DIAGNOSIS — Z6835 Body mass index (BMI) 35.0-35.9, adult: Secondary | ICD-10-CM | POA: Diagnosis not present

## 2018-08-03 ENCOUNTER — Encounter: Payer: Self-pay | Admitting: Cardiology

## 2018-08-08 DIAGNOSIS — R238 Other skin changes: Secondary | ICD-10-CM | POA: Diagnosis not present

## 2018-08-08 DIAGNOSIS — E119 Type 2 diabetes mellitus without complications: Secondary | ICD-10-CM | POA: Diagnosis not present

## 2018-08-08 DIAGNOSIS — I110 Hypertensive heart disease with heart failure: Secondary | ICD-10-CM | POA: Diagnosis not present

## 2018-08-08 DIAGNOSIS — S81802D Unspecified open wound, left lower leg, subsequent encounter: Secondary | ICD-10-CM | POA: Diagnosis not present

## 2018-08-08 DIAGNOSIS — I5030 Unspecified diastolic (congestive) heart failure: Secondary | ICD-10-CM | POA: Diagnosis not present

## 2018-08-08 DIAGNOSIS — S81801D Unspecified open wound, right lower leg, subsequent encounter: Secondary | ICD-10-CM | POA: Diagnosis not present

## 2018-08-15 DIAGNOSIS — I5030 Unspecified diastolic (congestive) heart failure: Secondary | ICD-10-CM | POA: Diagnosis not present

## 2018-08-15 DIAGNOSIS — I110 Hypertensive heart disease with heart failure: Secondary | ICD-10-CM | POA: Diagnosis not present

## 2018-08-15 DIAGNOSIS — S81801D Unspecified open wound, right lower leg, subsequent encounter: Secondary | ICD-10-CM | POA: Diagnosis not present

## 2018-08-15 DIAGNOSIS — S81802D Unspecified open wound, left lower leg, subsequent encounter: Secondary | ICD-10-CM | POA: Diagnosis not present

## 2018-08-15 DIAGNOSIS — R238 Other skin changes: Secondary | ICD-10-CM | POA: Diagnosis not present

## 2018-08-15 DIAGNOSIS — E119 Type 2 diabetes mellitus without complications: Secondary | ICD-10-CM | POA: Diagnosis not present

## 2018-08-22 ENCOUNTER — Ambulatory Visit (INDEPENDENT_AMBULATORY_CARE_PROVIDER_SITE_OTHER): Payer: Medicare Other | Admitting: Cardiology

## 2018-08-22 ENCOUNTER — Encounter: Payer: Self-pay | Admitting: Cardiology

## 2018-08-22 VITALS — BP 128/70 | HR 78 | Ht 72.0 in | Wt 265.0 lb

## 2018-08-22 DIAGNOSIS — I5030 Unspecified diastolic (congestive) heart failure: Secondary | ICD-10-CM | POA: Diagnosis not present

## 2018-08-22 DIAGNOSIS — E119 Type 2 diabetes mellitus without complications: Secondary | ICD-10-CM | POA: Diagnosis not present

## 2018-08-22 DIAGNOSIS — S81801D Unspecified open wound, right lower leg, subsequent encounter: Secondary | ICD-10-CM | POA: Diagnosis not present

## 2018-08-22 DIAGNOSIS — I4892 Unspecified atrial flutter: Secondary | ICD-10-CM

## 2018-08-22 DIAGNOSIS — I10 Essential (primary) hypertension: Secondary | ICD-10-CM | POA: Diagnosis not present

## 2018-08-22 DIAGNOSIS — S81802D Unspecified open wound, left lower leg, subsequent encounter: Secondary | ICD-10-CM | POA: Diagnosis not present

## 2018-08-22 DIAGNOSIS — R238 Other skin changes: Secondary | ICD-10-CM | POA: Diagnosis not present

## 2018-08-22 DIAGNOSIS — I110 Hypertensive heart disease with heart failure: Secondary | ICD-10-CM | POA: Diagnosis not present

## 2018-08-22 DIAGNOSIS — I5033 Acute on chronic diastolic (congestive) heart failure: Secondary | ICD-10-CM

## 2018-08-22 MED ORDER — TORSEMIDE 20 MG PO TABS: 80.0000 mg | ORAL_TABLET | Freq: Every day | ORAL | 3 refills | Status: DC

## 2018-08-22 NOTE — Patient Instructions (Signed)
Your physician recommends that you schedule a follow-up appointment PENDING WITH DR [facility] AND 1 WEEK FOR NURSING APPT VITALS AND WEIGHT  Your physician has recommended you make the following change in your medication:   INCREASE TORSEMIDE 80 MG DAILY (4 TABLETS)  Your physician recommends that you return for lab work in: 1 WEEK - WE HAVE GIVEN YOU ORDERS FOR HOME HEALTH TO DRAW THESE.  Thank you for choosing [facility]!!

## 2018-08-22 NOTE — Progress Notes (Signed)
Clinical Summary Alan Briggs is a 84 y.o.maleseen today for follow up of the following medical problems.   1. Acute on chronic diastolic HF - last visit we increased torsemide to 60mg  daily on 06/20/18 - repeat labs showed stable renal funciton, K was 3.4  - at nursing visit 9/10 weight was down 8 lbs - since then weight back up 9 lbs to 265 lbs. Has had some increased LE edema. Family has some question about his diuretic compliance but patient is adamant about compliance with his torsemide.    2.Sick sinus syndrome - history of pacemaker placement in 1996. Replaced in 2008 - previously followed at [facility]. From there notes he has a MDT Adapta DR device placed in 2008.   - followed in device clinic, no recent symptoms.    3. HTN -he is compliant with meds  4. OSA - history of OSA, has not wanted to wear CPAP  6. Aflutter - noted onpriordevice checks - frequent falls, has not been started on anticoag.Also history of GI bleed  - no recent symptoms.    7. History of GI bleed - admit 04/2017 with GI bleed Past Medical History:  Diagnosis Date  . Cardiac pacemaker in situ    for sick sinus syndrome-last placement was 09/2007 [facility]  . Diabetes [facility])   . GERD (gastroesophageal reflux disease)   . Mixed hyperlipidemia   . Obstructive lung disease (HCC)    non compliant with home O2  . Pulmonary hypertension (HCC)   . Sleep apnea   . Thrombocytopenia (HCC)   . Venous insufficiency (chronic) (peripheral)      Allergies  Allergen Reactions  . Advair Diskus [Fluticasone-Salmeterol] Other (See Comments)    Reaction:  Unknown   . Combivent [Ipratropium-Albuterol] Other (See Comments)    Reaction:  Unknown   . Lotensin Hct [Benazepril-Hydrochlorothiazide] Other (See Comments)    Reaction:  Unknown   . Penicillins Swelling and Other (See Comments)    Reaction:  Facial swelling Has patient had a PCN reaction causing immediate rash,  facial/tongue/throat swelling, SOB or lightheadedness with hypotension: Yes Has patient had a PCN reaction causing severe rash involving mucus membranes or skin necrosis: No Has patient had a PCN reaction that required hospitalization No Has patient had a PCN reaction occurring within the last 10 years: No If all of the above answers are "NO", then may proceed with Cephalosporin use.  . Simvastatin Other (See Comments)    Reaction:  Muscle pain      Current Outpatient Medications  Medication Sig Dispense Refill  . aspirin EC 81 MG tablet Take 81 mg by mouth daily.    Marland Kitchen atenolol (TENORMIN) 25 MG tablet Take 1 tablet (25 mg total) by mouth daily. 90 tablet 3  . insulin glargine (LANTUS) 100 UNIT/ML injection Inject 0.1 mLs (10 Units total) into the skin at bedtime. (Patient taking differently: Inject 12 Units into the skin at bedtime. ) 10 mL 11  . meclizine (ANTIVERT) 25 MG tablet Take 1 tablet (25 mg total) by mouth every 8 (eight) hours as needed for dizziness. 90 tablet 1  . Multiple Vitamin (MULTIVITAMIN WITH MINERALS) TABS tablet Take 1 tablet by mouth daily.    . Multiple Vitamins-Minerals (ICAPS AREDS 2) CAPS Take 1 capsule by mouth daily.    . potassium chloride SA (K-DUR,KLOR-CON) 20 MEQ tablet TAKE 2 TABLETS FOR 2 DAYS THEN TAKE 1 TABLET DAILY 90 tablet 3  . torsemide (DEMADEX) 20 MG tablet Take  3 tablets (60 mg total) by mouth daily. 90 tablet 3   No current facility-administered medications for this visit.      Past Surgical History:  Procedure Laterality Date  . CATARACT EXTRACTION, BILATERAL Bilateral   . COLONOSCOPY N/A 04/24/2017   Procedure: COLONOSCOPY;  Surgeon: Corbin Ade, MD;  [facility];  Service: Endoscopy;  Laterality: N/A;  . ESOPHAGOGASTRODUODENOSCOPY N/A 04/24/2017   Procedure: ESOPHAGOGASTRODUODENOSCOPY (EGD);  Surgeon: Corbin Ade, MD;  [facility];  Service: Endoscopy;  Laterality: N/A;  . KIDNEY STONE SURGERY Left    laser  ablation   . PACEMAKER IMPLANT    . PERCUTANEOUS PINNING Right 04/20/2016   Procedure: IRRIGATION AND DEBRIDEMENT RIGHT FOOT, PERCUTANEOUS PINNING SMALL TOE;  Surgeon: Tarry Kos, MD;  Location: [facility];  Service: Orthopedics;  Laterality: Right;  . REFRACTIVE SURGERY Right   . ROTATOR CUFF REPAIR Left      Allergies  Allergen Reactions  . Advair Diskus [Fluticasone-Salmeterol] Other (See Comments)    Reaction:  Unknown   . Combivent [Ipratropium-Albuterol] Other (See Comments)    Reaction:  Unknown   . Lotensin Hct [Benazepril-Hydrochlorothiazide] Other (See Comments)    Reaction:  Unknown   . Penicillins Swelling and Other (See Comments)    Reaction:  Facial swelling Has patient had a PCN reaction causing immediate rash, facial/tongue/throat swelling, SOB or lightheadedness with hypotension: Yes Has patient had a PCN reaction causing severe rash involving mucus membranes or skin necrosis: No Has patient had a PCN reaction that required hospitalization No Has patient had a PCN reaction occurring within the last 10 years: No If all of the above answers are "NO", then may proceed with Cephalosporin use.  . Simvastatin Other (See Comments)    Reaction:  Muscle pain       Family History  Problem Relation Age of Onset  . Diabetes Mother   . Colon cancer Neg Hx   . Colon polyps Neg Hx      Social History Alan Briggs reports that he quit smoking about 39 years ago. His smoking use included cigarettes. He started smoking about 69 years ago. He has a 15.00 pack-year smoking history. He has never used smokeless tobacco. Alan Briggs reports that he does not drink alcohol.   Review of Systems CONSTITUTIONAL: No weight loss, fever, chills, weakness or fatigue.  HEENT: Eyes: No visual loss, blurred vision, double vision or yellow sclerae.No hearing loss, sneezing, congestion, runny nose or sore throat.  SKIN: No rash or itching.  CARDIOVASCULAR: per hpi RESPIRATORY: No shortness of breath,  cough or sputum.  GASTROINTESTINAL: No anorexia, nausea, vomiting or diarrhea. No abdominal pain or blood.  GENITOURINARY: No burning on urination, no polyuria NEUROLOGICAL: No headache, dizziness, syncope, paralysis, ataxia, numbness or tingling in the extremities. No change in bowel or bladder control.  MUSCULOSKELETAL: No muscle, back pain, joint pain or stiffness.  LYMPHATICS: No enlarged nodes. No history of splenectomy.  PSYCHIATRIC: No history of depression or anxiety.  ENDOCRINOLOGIC: No reports of sweating, cold or heat intolerance. No polyuria or polydipsia.  Marland Kitchen   Physical Examination Vitals:   08/22/18 1414  BP: 128/70  Pulse: 78  SpO2: 94%   Vitals:   08/22/18 1414  Weight: 265 lb (120.2 kg)  Height: 6' (1.829 m)    Gen: resting comfortably, no acute distress HEENT: no scleral icterus, pupils equal round and reactive, no palptable cervical adenopathy,  CV: RRR, no m/r/g. Elevated JVD Resp: Clear to auscultation  bilaterally GI: abdomen is soft, non-tender, non-distended, normal bowel sounds, no hepatosplenomegaly MSK: extremities are warm, 1+bilateral LE edema Skin: warm, no rash Neuro:  no focal deficits Psych: appropriate affect   Diagnostic Studies     Assessment and Plan  1. Acute on chronic diastolic HF -weight trending back up, we will change torsemide from 60mg  to 80mg  daily. Nursing visit in 1 week for weight and bp check, BMET/Mg next week through his home health    2. HTN - he is at goal, continue current meds  3. Aflutter - not on anticoag due to history of frequent falls as well as recent GI bleed - no symptoms, continue current meds   Nursing visit 1 week for vitals and weight check. F/u with me pending that evaluation      Antoine Poche, M.D..

## 2018-08-29 ENCOUNTER — Encounter: Payer: Self-pay | Admitting: *Deleted

## 2018-08-29 ENCOUNTER — Ambulatory Visit (INDEPENDENT_AMBULATORY_CARE_PROVIDER_SITE_OTHER): Payer: Medicare Other | Admitting: *Deleted

## 2018-08-29 VITALS — BP 113/58 | HR 70 | Ht 72.0 in | Wt 264.2 lb

## 2018-08-29 DIAGNOSIS — E875 Hyperkalemia: Secondary | ICD-10-CM

## 2018-08-29 DIAGNOSIS — I5030 Unspecified diastolic (congestive) heart failure: Secondary | ICD-10-CM | POA: Diagnosis not present

## 2018-08-29 DIAGNOSIS — I5033 Acute on chronic diastolic (congestive) heart failure: Secondary | ICD-10-CM | POA: Diagnosis not present

## 2018-08-29 DIAGNOSIS — S81802D Unspecified open wound, left lower leg, subsequent encounter: Secondary | ICD-10-CM | POA: Diagnosis not present

## 2018-08-29 DIAGNOSIS — I10 Essential (primary) hypertension: Secondary | ICD-10-CM | POA: Diagnosis not present

## 2018-08-29 DIAGNOSIS — I5032 Chronic diastolic (congestive) heart failure: Secondary | ICD-10-CM | POA: Diagnosis not present

## 2018-08-29 DIAGNOSIS — I110 Hypertensive heart disease with heart failure: Secondary | ICD-10-CM | POA: Diagnosis not present

## 2018-08-29 DIAGNOSIS — S81801D Unspecified open wound, right lower leg, subsequent encounter: Secondary | ICD-10-CM | POA: Diagnosis not present

## 2018-08-29 DIAGNOSIS — E119 Type 2 diabetes mellitus without complications: Secondary | ICD-10-CM | POA: Diagnosis not present

## 2018-08-29 DIAGNOSIS — R238 Other skin changes: Secondary | ICD-10-CM | POA: Diagnosis not present

## 2018-08-29 NOTE — Patient Instructions (Signed)
Continue same plan for now We will contact you after your provider reviews your results from today.

## 2018-08-29 NOTE — Progress Notes (Signed)
Presents for nurse visit to check vital signs and weight per last office visit. Medications reconciled. Patient has taken all doses of medications without side effects. No c/o dizziness, chest pain or sob. Admits to eating a quart and a half of brunswick stew over a weeks time and says it had a lot of salt in it. Patient says that his home health nurse obtained a blood specimen this morning.

## 2018-08-31 MED ORDER — POTASSIUM CHLORIDE CRYS ER 20 MEQ PO TBCR: EXTENDED_RELEASE_TABLET | ORAL | 3 refills | Status: DC

## 2018-08-31 NOTE — Addendum Note (Signed)
Addended by: Eustace MooreANDERSON, Rogue Rafalski M on: 08/31/2018 08:46 AM   Modules accepted: Orders

## 2018-08-31 NOTE — Progress Notes (Signed)
Labs show his potassium is a little elevated. Verify he is taking KCl 20mEq daily and if so hold for 2 days then start taking every other day. Mild change in his weight, perhaps due to his high salt intake from the soup, would continue torsemide as currently prescribed. Nursing visit 2 weeks for weight and vitals check and needs repeat BMET/Mg at that time      Dominga FerryJ Branch MD

## 2018-08-31 NOTE — Progress Notes (Addendum)
Son, Alan Briggs informed and confirmed that patient is taking potassium 20 meq daily. Alan Briggs informed of changes and verbalized understanding of plan. Lab order faxed to [facility].

## 2018-09-01 ENCOUNTER — Telehealth: Payer: Self-pay | Admitting: Cardiology

## 2018-09-01 NOTE — Telephone Encounter (Signed)
Patient called wanting to speak to a nurse in regards to his medications.  He requested to be called at his home .

## 2018-09-01 NOTE — Telephone Encounter (Signed)
Patient informed of recent med change and plan and verbalized understanding. Patient request to be called directly with any changes or plan.

## 2018-09-05 DIAGNOSIS — R238 Other skin changes: Secondary | ICD-10-CM | POA: Diagnosis not present

## 2018-09-05 DIAGNOSIS — I110 Hypertensive heart disease with heart failure: Secondary | ICD-10-CM | POA: Diagnosis not present

## 2018-09-05 DIAGNOSIS — S81801D Unspecified open wound, right lower leg, subsequent encounter: Secondary | ICD-10-CM | POA: Diagnosis not present

## 2018-09-05 DIAGNOSIS — E119 Type 2 diabetes mellitus without complications: Secondary | ICD-10-CM | POA: Diagnosis not present

## 2018-09-05 DIAGNOSIS — I5030 Unspecified diastolic (congestive) heart failure: Secondary | ICD-10-CM | POA: Diagnosis not present

## 2018-09-05 DIAGNOSIS — S81802D Unspecified open wound, left lower leg, subsequent encounter: Secondary | ICD-10-CM | POA: Diagnosis not present

## 2018-09-05 LAB — CUP PACEART REMOTE DEVICE CHECK
Battery Impedance: 3311 Ohm
Brady Statistic AP VP Percent: 85 %
Brady Statistic AP VS Percent: 0 %
Brady Statistic AS VP Percent: 15 %
Brady Statistic AS VS Percent: 0 %
Implantable Lead Implant Date: 19960418
Implantable Lead Location: 753860
Implantable Lead Model: 5034
Implantable Lead Model: 5534
Lead Channel Impedance Value: 1043 Ohm
Lead Channel Impedance Value: 1410 Ohm
Lead Channel Pacing Threshold Amplitude: 0.625 V
Lead Channel Pacing Threshold Amplitude: 1.5 V
Lead Channel Setting Pacing Amplitude: 2.5 V
Lead Channel Setting Sensing Sensitivity: 2 mV
MDC IDC LEAD IMPLANT DT: 19960418
MDC IDC LEAD LOCATION: 753859
MDC IDC MSMT BATTERY REMAINING LONGEVITY: 18 mo
MDC IDC MSMT BATTERY VOLTAGE: 2.72 V
MDC IDC MSMT LEADCHNL RA PACING THRESHOLD PULSEWIDTH: 0.4 ms
MDC IDC MSMT LEADCHNL RV PACING THRESHOLD PULSEWIDTH: 0.4 ms
MDC IDC PG IMPLANT DT: 20081211
MDC IDC SESS DTM: 20191008193316
MDC IDC SET LEADCHNL RA PACING AMPLITUDE: 2 V
MDC IDC SET LEADCHNL RV PACING PULSEWIDTH: 0.76 ms

## 2018-09-12 DIAGNOSIS — I110 Hypertensive heart disease with heart failure: Secondary | ICD-10-CM | POA: Diagnosis not present

## 2018-09-12 DIAGNOSIS — S81801D Unspecified open wound, right lower leg, subsequent encounter: Secondary | ICD-10-CM | POA: Diagnosis not present

## 2018-09-12 DIAGNOSIS — E119 Type 2 diabetes mellitus without complications: Secondary | ICD-10-CM | POA: Diagnosis not present

## 2018-09-12 DIAGNOSIS — I5030 Unspecified diastolic (congestive) heart failure: Secondary | ICD-10-CM | POA: Diagnosis not present

## 2018-09-12 DIAGNOSIS — S81802D Unspecified open wound, left lower leg, subsequent encounter: Secondary | ICD-10-CM | POA: Diagnosis not present

## 2018-09-12 DIAGNOSIS — R238 Other skin changes: Secondary | ICD-10-CM | POA: Diagnosis not present

## 2018-09-18 DIAGNOSIS — S81802D Unspecified open wound, left lower leg, subsequent encounter: Secondary | ICD-10-CM | POA: Diagnosis not present

## 2018-09-18 DIAGNOSIS — I5030 Unspecified diastolic (congestive) heart failure: Secondary | ICD-10-CM | POA: Diagnosis not present

## 2018-09-18 DIAGNOSIS — E119 Type 2 diabetes mellitus without complications: Secondary | ICD-10-CM | POA: Diagnosis not present

## 2018-09-18 DIAGNOSIS — I110 Hypertensive heart disease with heart failure: Secondary | ICD-10-CM | POA: Diagnosis not present

## 2018-09-18 DIAGNOSIS — S81801D Unspecified open wound, right lower leg, subsequent encounter: Secondary | ICD-10-CM | POA: Diagnosis not present

## 2018-09-18 DIAGNOSIS — R238 Other skin changes: Secondary | ICD-10-CM | POA: Diagnosis not present

## 2018-09-19 ENCOUNTER — Ambulatory Visit (INDEPENDENT_AMBULATORY_CARE_PROVIDER_SITE_OTHER): Payer: Medicare Other | Admitting: *Deleted

## 2018-09-19 VITALS — BP 134/73 | HR 75 | Ht 72.0 in | Wt 266.0 lb

## 2018-09-19 DIAGNOSIS — R238 Other skin changes: Secondary | ICD-10-CM | POA: Diagnosis not present

## 2018-09-19 DIAGNOSIS — D530 Protein deficiency anemia: Secondary | ICD-10-CM | POA: Diagnosis not present

## 2018-09-19 DIAGNOSIS — I5030 Unspecified diastolic (congestive) heart failure: Secondary | ICD-10-CM | POA: Diagnosis not present

## 2018-09-19 DIAGNOSIS — Z794 Long term (current) use of insulin: Secondary | ICD-10-CM | POA: Diagnosis not present

## 2018-09-19 DIAGNOSIS — D6489 Other specified anemias: Secondary | ICD-10-CM | POA: Diagnosis not present

## 2018-09-19 DIAGNOSIS — Z5181 Encounter for therapeutic drug level monitoring: Secondary | ICD-10-CM | POA: Diagnosis not present

## 2018-09-19 DIAGNOSIS — S81802D Unspecified open wound, left lower leg, subsequent encounter: Secondary | ICD-10-CM | POA: Diagnosis not present

## 2018-09-19 DIAGNOSIS — Z4800 Encounter for change or removal of nonsurgical wound dressing: Secondary | ICD-10-CM | POA: Diagnosis not present

## 2018-09-19 DIAGNOSIS — S81801D Unspecified open wound, right lower leg, subsequent encounter: Secondary | ICD-10-CM | POA: Diagnosis not present

## 2018-09-19 DIAGNOSIS — E119 Type 2 diabetes mellitus without complications: Secondary | ICD-10-CM | POA: Diagnosis not present

## 2018-09-19 DIAGNOSIS — I5033 Acute on chronic diastolic (congestive) heart failure: Secondary | ICD-10-CM

## 2018-09-19 DIAGNOSIS — J449 Chronic obstructive pulmonary disease, unspecified: Secondary | ICD-10-CM | POA: Diagnosis not present

## 2018-09-19 DIAGNOSIS — Z87891 Personal history of nicotine dependence: Secondary | ICD-10-CM | POA: Diagnosis not present

## 2018-09-19 DIAGNOSIS — G4733 Obstructive sleep apnea (adult) (pediatric): Secondary | ICD-10-CM | POA: Diagnosis not present

## 2018-09-19 DIAGNOSIS — Z950 Presence of cardiac pacemaker: Secondary | ICD-10-CM | POA: Diagnosis not present

## 2018-09-19 DIAGNOSIS — I110 Hypertensive heart disease with heart failure: Secondary | ICD-10-CM | POA: Diagnosis not present

## 2018-09-19 DIAGNOSIS — Z742 Need for assistance at home and no other household member able to render care: Secondary | ICD-10-CM | POA: Diagnosis not present

## 2018-09-19 DIAGNOSIS — M48061 Spinal stenosis, lumbar region without neurogenic claudication: Secondary | ICD-10-CM | POA: Diagnosis not present

## 2018-09-19 NOTE — Patient Instructions (Signed)
Advised that he will be contacted with his providers response once visit reviewed.

## 2018-09-19 NOTE — Progress Notes (Signed)
Presented to office for nurse vital sign and weight check per last nurse visit. Patient has taken all doses of medications as prescribed without side effects. Denies dizziness, chest pain. Admit to having some sob with walking only.Medications reconciled. Currently has home health nurse coming out whom patient said wrapped legs bilaterally this morning. No planned follow up with Dr. Wyline MoodBranch.

## 2018-09-20 ENCOUNTER — Encounter: Payer: Self-pay | Admitting: *Deleted

## 2018-09-20 NOTE — Progress Notes (Signed)
Antoine PocheBranch, Jonathan F, MD  Eustace MooreAnderson, Noelle Sease M, RN        Ok thx, if we could have home health update us on his swelling and weight next week. F/u with me 2.5 months    J BrancH MD   Previous Messages    ----- Message -----  From: Eustace MooreAnderson, Stanislawa Gaffin M, RN  Sent: 09/20/2018  1:03 PM EST  To: Antoine PocheJonathan F Branch, MD  Subject: swelling in extremities              Hey Dr. Wyline MoodBranch.  When he came in, his legs did not look as swollen as they have in the past. He did have improvement in them. His legs were wrapped of course. I will contact his home health nurse to see what her assessment has been as far as his swelling. Also, his home health nurse could check his BP and weights at home if you'd like. I think she goes out to see him weekly. Also, he doesn't have any planned follow up with you.

## 2018-09-20 NOTE — Progress Notes (Signed)
Weights have not come down much. Is his swelling any better since we changed his torsemide?   Dominga FerryJ Gilman Olazabal MD

## 2018-09-20 NOTE — Progress Notes (Signed)
Message routed to [facility] via fax. Patient informed and verbalized understanding of plan.

## 2018-09-26 ENCOUNTER — Telehealth: Payer: Self-pay | Admitting: Cardiology

## 2018-09-26 DIAGNOSIS — E119 Type 2 diabetes mellitus without complications: Secondary | ICD-10-CM | POA: Diagnosis not present

## 2018-09-26 DIAGNOSIS — S81801D Unspecified open wound, right lower leg, subsequent encounter: Secondary | ICD-10-CM | POA: Diagnosis not present

## 2018-09-26 DIAGNOSIS — I5030 Unspecified diastolic (congestive) heart failure: Secondary | ICD-10-CM | POA: Diagnosis not present

## 2018-09-26 DIAGNOSIS — R238 Other skin changes: Secondary | ICD-10-CM | POA: Diagnosis not present

## 2018-09-26 DIAGNOSIS — I110 Hypertensive heart disease with heart failure: Secondary | ICD-10-CM | POA: Diagnosis not present

## 2018-09-26 DIAGNOSIS — S81802D Unspecified open wound, left lower leg, subsequent encounter: Secondary | ICD-10-CM | POA: Diagnosis not present

## 2018-09-26 NOTE — Telephone Encounter (Signed)
Please give Alan CornfieldStephanie [facility] concerning [facility]'s weight and vitals

## 2018-09-26 NOTE — Telephone Encounter (Signed)
Called [name] with [facility]. No answer. Left message to call back.

## 2018-09-27 DIAGNOSIS — R238 Other skin changes: Secondary | ICD-10-CM | POA: Diagnosis not present

## 2018-09-27 DIAGNOSIS — I5030 Unspecified diastolic (congestive) heart failure: Secondary | ICD-10-CM | POA: Diagnosis not present

## 2018-09-27 DIAGNOSIS — E119 Type 2 diabetes mellitus without complications: Secondary | ICD-10-CM | POA: Diagnosis not present

## 2018-09-27 DIAGNOSIS — S81802D Unspecified open wound, left lower leg, subsequent encounter: Secondary | ICD-10-CM | POA: Diagnosis not present

## 2018-09-27 DIAGNOSIS — S81801D Unspecified open wound, right lower leg, subsequent encounter: Secondary | ICD-10-CM | POA: Diagnosis not present

## 2018-09-27 DIAGNOSIS — I110 Hypertensive heart disease with heart failure: Secondary | ICD-10-CM | POA: Diagnosis not present

## 2018-09-27 NOTE — Telephone Encounter (Signed)
Called JayStephanie, no answer. Voicemail full.

## 2018-09-28 ENCOUNTER — Encounter: Payer: Self-pay | Admitting: Internal Medicine

## 2018-09-28 ENCOUNTER — Encounter

## 2018-09-28 ENCOUNTER — Ambulatory Visit (INDEPENDENT_AMBULATORY_CARE_PROVIDER_SITE_OTHER): Payer: Medicare Other | Admitting: Internal Medicine

## 2018-09-28 VITALS — BP 118/64 | HR 65 | Ht 72.0 in | Wt 265.0 lb

## 2018-09-28 DIAGNOSIS — I495 Sick sinus syndrome: Secondary | ICD-10-CM

## 2018-09-28 DIAGNOSIS — I5032 Chronic diastolic (congestive) heart failure: Secondary | ICD-10-CM

## 2018-09-28 NOTE — Progress Notes (Signed)
HPI  Allergies  Allergen Reactions  . Advair Diskus [Fluticasone-Salmeterol] Other (See Comments)    Reaction:  Unknown   . Combivent [Ipratropium-Albuterol] Other (See Comments)    Reaction:  Unknown   . Lotensin Hct [Benazepril-Hydrochlorothiazide] Other (See Comments)    Reaction:  Unknown   . Penicillins Swelling and Other (See Comments)    Reaction:  Facial swelling Has patient had a PCN reaction causing immediate rash, facial/tongue/throat swelling, SOB or lightheadedness with hypotension: Yes Has patient had a PCN reaction causing severe rash involving mucus membranes or skin necrosis: No Has patient had a PCN reaction that required hospitalization No Has patient had a PCN reaction occurring within the last 10 years: No If all of the above answers are "NO", then may proceed with Cephalosporin use.  . Simvastatin Other (See Comments)    Reaction:  Muscle pain      Current Outpatient Medications  Medication Sig Dispense Refill  . aspirin EC 81 MG tablet Take 81 mg by mouth daily.    Marland Kitchen atenolol (TENORMIN) 25 MG tablet Take 1 tablet (25 mg total) by mouth daily. 90 tablet 3  . HUMALOG KWIKPEN 100 UNIT/ML KwikPen as needed. Sliding scale  0  . insulin glargine (LANTUS) 100 UNIT/ML injection Inject 0.1 mLs (10 Units total) into the skin at bedtime. (Patient taking differently: Inject 30 Units into the skin at bedtime. ) 10 mL 11  . meclizine (ANTIVERT) 25 MG tablet Take 1 tablet (25 mg total) by mouth every 8 (eight) hours as needed for dizziness. 90 tablet 1  . Multiple Vitamin (MULTIVITAMIN WITH MINERALS) TABS tablet Take 1 tablet by mouth daily.    . Multiple Vitamins-Minerals (ICAPS AREDS 2) CAPS Take 1 capsule by mouth daily.    . potassium chloride SA (K-DUR,KLOR-CON) 20 MEQ tablet HOLD FOR 2 DAYS; THEN, TAKE 1 TABLETS EVERY OTHER DAY (Patient taking differently: THEN, TAKE 1 TABLETS EVERY OTHER DAY) 45 tablet 3  . torsemide (DEMADEX) 20 MG tablet Take 4 tablets (80  mg total) by mouth daily. 90 tablet 3   No current facility-administered medications for this visit.      Past Medical History:  Diagnosis Date  . Cardiac pacemaker in situ    for sick sinus syndrome-last placement was 09/2007 [facility]  . Diabetes [facility])   . GERD (gastroesophageal reflux disease)   . Mixed hyperlipidemia   . Obstructive lung disease (HCC)    non compliant with home O2  . Pulmonary hypertension (HCC)   . Sleep apnea   . Thrombocytopenia (HCC)   . Venous insufficiency (chronic) (peripheral)     ROS:   All systems reviewed and negative except as noted in the HPI.   Past Surgical History:  Procedure Laterality Date  . CATARACT EXTRACTION, BILATERAL Bilateral   . COLONOSCOPY N/A 04/24/2017   Procedure: COLONOSCOPY;  Surgeon: Corbin Ade, MD;  [facility];  Service: Endoscopy;  Laterality: N/A;  . ESOPHAGOGASTRODUODENOSCOPY N/A 04/24/2017   Procedure: ESOPHAGOGASTRODUODENOSCOPY (EGD);  Surgeon: Corbin Ade, MD;  [facility];  Service: Endoscopy;  Laterality: N/A;  . KIDNEY STONE SURGERY Left    laser ablation   . PACEMAKER IMPLANT    . PERCUTANEOUS PINNING Right 04/20/2016   Procedure: IRRIGATION AND DEBRIDEMENT RIGHT FOOT, PERCUTANEOUS PINNING SMALL TOE;  Surgeon: Tarry Kos, MD;  Location: [facility] OR;  Service: Orthopedics;  Laterality: Right;  . REFRACTIVE SURGERY Right   . ROTATOR CUFF REPAIR Left  Family History  Problem Relation Age of Onset  . Diabetes Mother   . Colon cancer Neg Hx   . Colon polyps Neg Hx      Social History   Socioeconomic History  . Marital status: Married    Spouse name: Not on file  . Number of children: Not on file  . Years of education: Not on file  . Highest education level: Not on file  Occupational History  . Not on file  Social Needs  . Financial resource strain: Not on file  . Food insecurity:    Worry: Not on file    Inability: Not on file  . Transportation needs:     Medical: Not on file    Non-medical: Not on file  Tobacco Use  . Smoking status: Former Smoker    Packs/day: 0.50    Years: 30.00    Pack years: 15.00    Types: Cigarettes    Start date: 10/18/1948    Last attempt to quit: 10/18/1978    Years since quitting: 39.9  . Smokeless tobacco: Never Used  Substance and Sexual Activity  . Alcohol use: No    Alcohol/week: 0.0 standard drinks  . Drug use: No  . Sexual activity: Never  Lifestyle  . Physical activity:    Days per week: Not on file    Minutes per session: Not on file  . Stress: Not on file  Relationships  . Social connections:    Talks on phone: Not on file    Gets together: Not on file    Attends religious service: Not on file    Active member of club or organization: Not on file    Attends meetings of clubs or organizations: Not on file    Relationship status: Not on file  . Intimate partner violence:    Fear of current or ex partner: Not on file    Emotionally abused: Not on file    Physically abused: Not on file    Forced sexual activity: Not on file  Other Topics Concern  . Not on file  Social History Narrative  . Not on file     BP 118/64 (BP Location: Right Arm)   Pulse 65   Ht 6' (1.829 m)   Wt 265 lb (120.2 kg)   SpO2 92%   BMI 35.94 kg/m   Physical Exam:  Well appearing elderly man, NAD HEENT: Unremarkable Neck:  No JVD, no thyromegally Lymphatics:  No adenopathy Back:  No CVA tenderness Lungs:  Clear with no wheezes HEART:  Regular rate rhythm, no murmurs, no rubs, no clicks Abd:  soft, positive bowel sounds, no organomegally, no rebound, no guarding Ext:  2 plus pulses, no edema, no cyanosis, no clubbing Skin:  No rashes no nodules Neuro:  CN II through XII intact, motor grossly intact   DEVICE  Normal device function.  See PaceArt for details.   Assess/Plan: 1. Chronic atrial fib - his ventricular rates are well controlled. He is pacing 99%. 2. PPM - his Medtronic DDD PM is working  normally. He has just over a year of battery longevity. 3. Chronic diastolic heart failure - he has become more sedentary. He is asymptomatic.  Alan ReevesGregg Ronica Vivian,M.D.

## 2018-09-28 NOTE — Patient Instructions (Signed)
Medication Instructions:  Your physician recommends that you continue on your current medications as directed. Please refer to the Current Medication list given to you today.  If you need a refill on your cardiac medications before your next appointment, please call your pharmacy.   Lab work: NONE   If you have labs (blood work) drawn today and your tests are completely normal, you will receive your results only by: . MyChart Message (if you have MyChart) OR . A paper copy in the mail If you have any lab test that is abnormal or we need to change your treatment, we will call you to review the results.  Testing/Procedures: NONE   Follow-Up: At [facility], you and your health needs are our priority.  As part of our continuing mission to provide you with exceptional heart care, we have created designated Provider Care Teams.  These Care Teams include your primary Cardiologist (physician) and Advanced Practice Providers (APPs -  Physician Assistants and Nurse Practitioners) who all work together to provide you with the care you need, when you need it. You will need a follow up appointment in 1 years.  Please call our office 2 months in advance to schedule this appointment.  You may see Gregg Taylor, MD or one of the following Advanced Practice Providers on your designated Care Team:   Amber Seiler, NP . Renee Ursuy, PA-C  Any Other Special Instructions Will Be Listed Below (If Applicable). Thank you for choosing [facility]!     

## 2018-10-03 DIAGNOSIS — I110 Hypertensive heart disease with heart failure: Secondary | ICD-10-CM | POA: Diagnosis not present

## 2018-10-03 DIAGNOSIS — E119 Type 2 diabetes mellitus without complications: Secondary | ICD-10-CM | POA: Diagnosis not present

## 2018-10-03 DIAGNOSIS — R238 Other skin changes: Secondary | ICD-10-CM | POA: Diagnosis not present

## 2018-10-03 DIAGNOSIS — S81802D Unspecified open wound, left lower leg, subsequent encounter: Secondary | ICD-10-CM | POA: Diagnosis not present

## 2018-10-03 DIAGNOSIS — I5030 Unspecified diastolic (congestive) heart failure: Secondary | ICD-10-CM | POA: Diagnosis not present

## 2018-10-03 DIAGNOSIS — S81801D Unspecified open wound, right lower leg, subsequent encounter: Secondary | ICD-10-CM | POA: Diagnosis not present

## 2018-10-10 DIAGNOSIS — S81802D Unspecified open wound, left lower leg, subsequent encounter: Secondary | ICD-10-CM | POA: Diagnosis not present

## 2018-10-10 DIAGNOSIS — R238 Other skin changes: Secondary | ICD-10-CM | POA: Diagnosis not present

## 2018-10-10 DIAGNOSIS — I5030 Unspecified diastolic (congestive) heart failure: Secondary | ICD-10-CM | POA: Diagnosis not present

## 2018-10-10 DIAGNOSIS — E119 Type 2 diabetes mellitus without complications: Secondary | ICD-10-CM | POA: Diagnosis not present

## 2018-10-10 DIAGNOSIS — S81801D Unspecified open wound, right lower leg, subsequent encounter: Secondary | ICD-10-CM | POA: Diagnosis not present

## 2018-10-10 DIAGNOSIS — I110 Hypertensive heart disease with heart failure: Secondary | ICD-10-CM | POA: Diagnosis not present

## 2018-10-17 DIAGNOSIS — S81802D Unspecified open wound, left lower leg, subsequent encounter: Secondary | ICD-10-CM | POA: Diagnosis not present

## 2018-10-17 DIAGNOSIS — I5030 Unspecified diastolic (congestive) heart failure: Secondary | ICD-10-CM | POA: Diagnosis not present

## 2018-10-17 DIAGNOSIS — E119 Type 2 diabetes mellitus without complications: Secondary | ICD-10-CM | POA: Diagnosis not present

## 2018-10-17 DIAGNOSIS — S81801D Unspecified open wound, right lower leg, subsequent encounter: Secondary | ICD-10-CM | POA: Diagnosis not present

## 2018-10-17 DIAGNOSIS — R238 Other skin changes: Secondary | ICD-10-CM | POA: Diagnosis not present

## 2018-10-17 DIAGNOSIS — I110 Hypertensive heart disease with heart failure: Secondary | ICD-10-CM | POA: Diagnosis not present

## 2018-10-24 DIAGNOSIS — S81801D Unspecified open wound, right lower leg, subsequent encounter: Secondary | ICD-10-CM | POA: Diagnosis not present

## 2018-10-24 DIAGNOSIS — I110 Hypertensive heart disease with heart failure: Secondary | ICD-10-CM | POA: Diagnosis not present

## 2018-10-24 DIAGNOSIS — S81802D Unspecified open wound, left lower leg, subsequent encounter: Secondary | ICD-10-CM | POA: Diagnosis not present

## 2018-10-24 DIAGNOSIS — E119 Type 2 diabetes mellitus without complications: Secondary | ICD-10-CM | POA: Diagnosis not present

## 2018-10-24 DIAGNOSIS — R238 Other skin changes: Secondary | ICD-10-CM | POA: Diagnosis not present

## 2018-10-24 DIAGNOSIS — I5030 Unspecified diastolic (congestive) heart failure: Secondary | ICD-10-CM | POA: Diagnosis not present

## 2018-10-26 ENCOUNTER — Encounter: Payer: Self-pay | Admitting: Cardiology

## 2018-10-31 ENCOUNTER — Other Ambulatory Visit: Payer: Self-pay | Admitting: Cardiology

## 2018-10-31 ENCOUNTER — Ambulatory Visit (INDEPENDENT_AMBULATORY_CARE_PROVIDER_SITE_OTHER): Payer: Medicare Other

## 2018-10-31 DIAGNOSIS — I495 Sick sinus syndrome: Secondary | ICD-10-CM | POA: Diagnosis not present

## 2018-10-31 DIAGNOSIS — E119 Type 2 diabetes mellitus without complications: Secondary | ICD-10-CM | POA: Diagnosis not present

## 2018-10-31 DIAGNOSIS — I5030 Unspecified diastolic (congestive) heart failure: Secondary | ICD-10-CM | POA: Diagnosis not present

## 2018-10-31 DIAGNOSIS — S81802D Unspecified open wound, left lower leg, subsequent encounter: Secondary | ICD-10-CM | POA: Diagnosis not present

## 2018-10-31 DIAGNOSIS — S81801D Unspecified open wound, right lower leg, subsequent encounter: Secondary | ICD-10-CM | POA: Diagnosis not present

## 2018-10-31 DIAGNOSIS — I110 Hypertensive heart disease with heart failure: Secondary | ICD-10-CM | POA: Diagnosis not present

## 2018-10-31 DIAGNOSIS — I5032 Chronic diastolic (congestive) heart failure: Secondary | ICD-10-CM

## 2018-10-31 DIAGNOSIS — R238 Other skin changes: Secondary | ICD-10-CM | POA: Diagnosis not present

## 2018-10-31 MED ORDER — POTASSIUM CHLORIDE CRYS ER 20 MEQ PO TBCR: EXTENDED_RELEASE_TABLET | ORAL | 1 refills | Status: DC

## 2018-10-31 NOTE — Telephone Encounter (Signed)
Medication sent to pharmacy  

## 2018-10-31 NOTE — Telephone Encounter (Signed)
° ° °  1. Which medications need to be refilled? (please list name of each medication and dose if known) * potassium chloride SA (K-DUR,KLOR-CON) 20 MEQ tablet    2. Which pharmacy/location (including street and city if local pharmacy) is medication to be sent to?   [facility] [place]   3. Do they need a 30 day or 90 day supply? 30

## 2018-11-01 NOTE — Progress Notes (Signed)
Remote pacemaker transmission.   

## 2018-11-04 LAB — CUP PACEART REMOTE DEVICE CHECK
Battery Impedance: 3706 Ohm
Battery Remaining Longevity: 15 mo
Battery Voltage: 2.71 V
Brady Statistic AS VS Percent: 0 %
Implantable Lead Implant Date: 19960418
Implantable Lead Location: 753859
Implantable Lead Model: 5034
Implantable Lead Model: 5534
Implantable Pulse Generator Implant Date: 20081211
Lead Channel Impedance Value: 1025 Ohm
Lead Channel Pacing Threshold Amplitude: 0.5 V
Lead Channel Pacing Threshold Pulse Width: 0.4 ms
Lead Channel Pacing Threshold Pulse Width: 0.4 ms
Lead Channel Setting Pacing Amplitude: 2 V
Lead Channel Setting Pacing Pulse Width: 0.76 ms
MDC IDC LEAD IMPLANT DT: 19960418
MDC IDC LEAD LOCATION: 753860
MDC IDC MSMT LEADCHNL RV IMPEDANCE VALUE: 1459 Ohm
MDC IDC MSMT LEADCHNL RV PACING THRESHOLD AMPLITUDE: 1.75 V
MDC IDC SESS DTM: 20200114204310
MDC IDC SET LEADCHNL RV PACING AMPLITUDE: 2.5 V
MDC IDC SET LEADCHNL RV SENSING SENSITIVITY: 2 mV
MDC IDC STAT BRADY AP VP PERCENT: 89 %
MDC IDC STAT BRADY AP VS PERCENT: 0 %
MDC IDC STAT BRADY AS VP PERCENT: 11 %

## 2018-11-07 DIAGNOSIS — R238 Other skin changes: Secondary | ICD-10-CM | POA: Diagnosis not present

## 2018-11-07 DIAGNOSIS — I5030 Unspecified diastolic (congestive) heart failure: Secondary | ICD-10-CM | POA: Diagnosis not present

## 2018-11-07 DIAGNOSIS — E119 Type 2 diabetes mellitus without complications: Secondary | ICD-10-CM | POA: Diagnosis not present

## 2018-11-07 DIAGNOSIS — S81802D Unspecified open wound, left lower leg, subsequent encounter: Secondary | ICD-10-CM | POA: Diagnosis not present

## 2018-11-07 DIAGNOSIS — I110 Hypertensive heart disease with heart failure: Secondary | ICD-10-CM | POA: Diagnosis not present

## 2018-11-07 DIAGNOSIS — S81801D Unspecified open wound, right lower leg, subsequent encounter: Secondary | ICD-10-CM | POA: Diagnosis not present

## 2018-11-14 ENCOUNTER — Other Ambulatory Visit: Payer: Self-pay | Admitting: Cardiology

## 2018-11-14 ENCOUNTER — Other Ambulatory Visit: Payer: Self-pay

## 2018-11-14 DIAGNOSIS — S81801D Unspecified open wound, right lower leg, subsequent encounter: Secondary | ICD-10-CM | POA: Diagnosis not present

## 2018-11-14 DIAGNOSIS — S81802D Unspecified open wound, left lower leg, subsequent encounter: Secondary | ICD-10-CM | POA: Diagnosis not present

## 2018-11-14 DIAGNOSIS — R238 Other skin changes: Secondary | ICD-10-CM | POA: Diagnosis not present

## 2018-11-14 DIAGNOSIS — E119 Type 2 diabetes mellitus without complications: Secondary | ICD-10-CM | POA: Diagnosis not present

## 2018-11-14 DIAGNOSIS — I110 Hypertensive heart disease with heart failure: Secondary | ICD-10-CM | POA: Diagnosis not present

## 2018-11-14 DIAGNOSIS — I5030 Unspecified diastolic (congestive) heart failure: Secondary | ICD-10-CM | POA: Diagnosis not present

## 2018-11-14 MED ORDER — TORSEMIDE 20 MG PO TABS: 80.0000 mg | ORAL_TABLET | Freq: Every day | ORAL | 6 refills | Status: DC

## 2018-11-14 NOTE — Telephone Encounter (Signed)
Refilled torsemide 80 mg daily (#120) to [facility] in [place]

## 2018-11-18 DIAGNOSIS — G4733 Obstructive sleep apnea (adult) (pediatric): Secondary | ICD-10-CM | POA: Diagnosis not present

## 2018-11-18 DIAGNOSIS — M48061 Spinal stenosis, lumbar region without neurogenic claudication: Secondary | ICD-10-CM | POA: Diagnosis not present

## 2018-11-18 DIAGNOSIS — Z794 Long term (current) use of insulin: Secondary | ICD-10-CM | POA: Diagnosis not present

## 2018-11-18 DIAGNOSIS — Z742 Need for assistance at home and no other household member able to render care: Secondary | ICD-10-CM | POA: Diagnosis not present

## 2018-11-18 DIAGNOSIS — S81802D Unspecified open wound, left lower leg, subsequent encounter: Secondary | ICD-10-CM | POA: Diagnosis not present

## 2018-11-18 DIAGNOSIS — R238 Other skin changes: Secondary | ICD-10-CM | POA: Diagnosis not present

## 2018-11-18 DIAGNOSIS — Z5181 Encounter for therapeutic drug level monitoring: Secondary | ICD-10-CM | POA: Diagnosis not present

## 2018-11-18 DIAGNOSIS — J449 Chronic obstructive pulmonary disease, unspecified: Secondary | ICD-10-CM | POA: Diagnosis not present

## 2018-11-18 DIAGNOSIS — Z950 Presence of cardiac pacemaker: Secondary | ICD-10-CM | POA: Diagnosis not present

## 2018-11-18 DIAGNOSIS — S81801D Unspecified open wound, right lower leg, subsequent encounter: Secondary | ICD-10-CM | POA: Diagnosis not present

## 2018-11-18 DIAGNOSIS — I5030 Unspecified diastolic (congestive) heart failure: Secondary | ICD-10-CM | POA: Diagnosis not present

## 2018-11-18 DIAGNOSIS — I110 Hypertensive heart disease with heart failure: Secondary | ICD-10-CM | POA: Diagnosis not present

## 2018-11-18 DIAGNOSIS — Z87891 Personal history of nicotine dependence: Secondary | ICD-10-CM | POA: Diagnosis not present

## 2018-11-18 DIAGNOSIS — E119 Type 2 diabetes mellitus without complications: Secondary | ICD-10-CM | POA: Diagnosis not present

## 2018-11-18 DIAGNOSIS — D6489 Other specified anemias: Secondary | ICD-10-CM | POA: Diagnosis not present

## 2018-11-18 DIAGNOSIS — D530 Protein deficiency anemia: Secondary | ICD-10-CM | POA: Diagnosis not present

## 2018-11-18 DIAGNOSIS — Z4800 Encounter for change or removal of nonsurgical wound dressing: Secondary | ICD-10-CM | POA: Diagnosis not present

## 2018-11-20 DIAGNOSIS — E119 Type 2 diabetes mellitus without complications: Secondary | ICD-10-CM | POA: Diagnosis not present

## 2018-11-20 DIAGNOSIS — S81801D Unspecified open wound, right lower leg, subsequent encounter: Secondary | ICD-10-CM | POA: Diagnosis not present

## 2018-11-20 DIAGNOSIS — S81802D Unspecified open wound, left lower leg, subsequent encounter: Secondary | ICD-10-CM | POA: Diagnosis not present

## 2018-11-20 DIAGNOSIS — R238 Other skin changes: Secondary | ICD-10-CM | POA: Diagnosis not present

## 2018-11-20 DIAGNOSIS — I110 Hypertensive heart disease with heart failure: Secondary | ICD-10-CM | POA: Diagnosis not present

## 2018-11-20 DIAGNOSIS — I5030 Unspecified diastolic (congestive) heart failure: Secondary | ICD-10-CM | POA: Diagnosis not present

## 2018-11-21 ENCOUNTER — Encounter: Payer: Self-pay | Admitting: Cardiology

## 2018-11-21 ENCOUNTER — Ambulatory Visit (INDEPENDENT_AMBULATORY_CARE_PROVIDER_SITE_OTHER): Payer: Medicare Other | Admitting: Cardiology

## 2018-11-21 VITALS — BP 114/72 | HR 72 | Ht 72.0 in | Wt 267.2 lb

## 2018-11-21 DIAGNOSIS — S81801D Unspecified open wound, right lower leg, subsequent encounter: Secondary | ICD-10-CM | POA: Diagnosis not present

## 2018-11-21 DIAGNOSIS — I5033 Acute on chronic diastolic (congestive) heart failure: Secondary | ICD-10-CM

## 2018-11-21 DIAGNOSIS — R238 Other skin changes: Secondary | ICD-10-CM | POA: Diagnosis not present

## 2018-11-21 DIAGNOSIS — I5030 Unspecified diastolic (congestive) heart failure: Secondary | ICD-10-CM | POA: Diagnosis not present

## 2018-11-21 DIAGNOSIS — I110 Hypertensive heart disease with heart failure: Secondary | ICD-10-CM | POA: Diagnosis not present

## 2018-11-21 DIAGNOSIS — E119 Type 2 diabetes mellitus without complications: Secondary | ICD-10-CM | POA: Diagnosis not present

## 2018-11-21 DIAGNOSIS — S81802D Unspecified open wound, left lower leg, subsequent encounter: Secondary | ICD-10-CM | POA: Diagnosis not present

## 2018-11-21 MED ORDER — TORSEMIDE 20 MG PO TABS: ORAL_TABLET | ORAL | 1 refills | Status: DC

## 2018-11-21 NOTE — Patient Instructions (Signed)
Your physician recommends that you schedule a follow-up appointment in: 4 MONTHS WITH DR [facility]  Your physician has recommended you make the following change in your medication:   TAKE TORSEMIDE 80 MG (4 TABLETS) IN THE MORNING AND 40 MG ( 2 TABLETS) IN THE EVENING FOR 4 DAYS THEN RESUME 80 MG DAILY  Your physician recommends that you return for lab work BMP/MG - ORDERS GIVEN   Thank you for choosing [facility]!!

## 2018-11-21 NOTE — Progress Notes (Signed)
Clinical Summary Alan Briggs is a 85 y.o.male seen today for follow up of the following medical problems.This is a focused visit on his history of chronic diastolic HF.   1.Chronic diastolic HF - home weights 258 lbs and stable at home - recent issues with cellultis, started on abx.  - continues to require home health compression wrappings for swelling.    Past Medical History:  Diagnosis Date  . Cardiac pacemaker in situ    for sick sinus syndrome-[facility]  . Diabetes [facility]HCC)   . GERD (gastroesophageal reflux disease)   . Mixed hyperlipidemia   . Obstructive lung disease (HCC)    non compliant with home O2  . Pulmonary hypertension (HCC)   . Sleep apnea   . Thrombocytopenia (HCC)   . Venous insufficiency (chronic) (peripheral)      Allergies  Allergen Reactions  . Advair Diskus [Fluticasone-Salmeterol] Other (See Comments)    Reaction:  Unknown   . Combivent [Ipratropium-Albuterol] Other (See Comments)    Reaction:  Unknown   . Lotensin Hct [Benazepril-Hydrochlorothiazide] Other (See Comments)    Reaction:  Unknown   . Penicillins Swelling and Other (See Comments)    Reaction:  Facial swelling Has patient had a PCN reaction causing immediate rash, facial/tongue/throat swelling, SOB or lightheadedness with hypotension: Yes Has patient had a PCN reaction causing severe rash involving mucus membranes or skin necrosis: No Has patient had a PCN reaction that required hospitalization No Has patient had a PCN reaction occurring within the last 10 years: No If all of the above answers are "NO", then may proceed with Cephalosporin use.  . Simvastatin Other (See Comments)    Reaction:  Muscle pain      Current Outpatient Medications  Medication Sig Dispense Refill  . aspirin EC 81 MG tablet Take 81 mg by mouth daily.    Marland Kitchen. atenolol (TENORMIN) 25 MG tablet Take 1 tablet (25 mg total) by mouth daily. 90 tablet 3  . HUMALOG KWIKPEN 100  UNIT/ML KwikPen as needed. Sliding scale  0  . insulin glargine (LANTUS) 100 UNIT/ML injection Inject 0.1 mLs (10 Units total) into the skin at bedtime. (Patient taking differently: Inject 30 Units into the skin at bedtime. ) 10 mL 11  . meclizine (ANTIVERT) 25 MG tablet Take 1 tablet (25 mg total) by mouth every 8 (eight) hours as needed for dizziness. 90 tablet 1  . Multiple Vitamin (MULTIVITAMIN WITH MINERALS) TABS tablet Take 1 tablet by mouth daily.    . Multiple Vitamins-Minerals (ICAPS AREDS 2) CAPS Take 1 capsule by mouth daily.    . potassium chloride SA (K-DUR,KLOR-CON) 20 MEQ tablet THEN, TAKE 1 TABLETS EVERY OTHER DAY 45 tablet 1  . torsemide (DEMADEX) 20 MG tablet Take 4 tablets (80 mg total) by mouth daily. 120 tablet 6   No current facility-administered medications for this visit.      Past Surgical History:  Procedure Laterality Date  . CATARACT EXTRACTION, BILATERAL Bilateral   . COLONOSCOPY N/A 04/24/2017   Procedure: COLONOSCOPY;  Surgeon: Corbin Adeourk, Beckham M, MD;  [facility];  Service: Endoscopy;  Laterality: N/A;  . ESOPHAGOGASTRODUODENOSCOPY N/A 04/24/2017   Procedure: ESOPHAGOGASTRODUODENOSCOPY (EGD);  Surgeon: Corbin Adeourk, Apollo M, MD;  [facility];  Service: Endoscopy;  Laterality: N/A;  . KIDNEY STONE SURGERY Left    laser ablation   . PACEMAKER IMPLANT    . PERCUTANEOUS PINNING Right 04/20/2016   Procedure: IRRIGATION AND DEBRIDEMENT RIGHT  FOOT, PERCUTANEOUS PINNING SMALL TOE;  Surgeon: Tarry Kos, MD;  Location: [facility];  Service: Orthopedics;  Laterality: Right;  . REFRACTIVE SURGERY Right   . ROTATOR CUFF REPAIR Left      Allergies  Allergen Reactions  . Advair Diskus [Fluticasone-Salmeterol] Other (See Comments)    Reaction:  Unknown   . Combivent [Ipratropium-Albuterol] Other (See Comments)    Reaction:  Unknown   . Lotensin Hct [Benazepril-Hydrochlorothiazide] Other (See Comments)    Reaction:  Unknown   . Penicillins Swelling and Other  (See Comments)    Reaction:  Facial swelling Has patient had a PCN reaction causing immediate rash, facial/tongue/throat swelling, SOB or lightheadedness with hypotension: Yes Has patient had a PCN reaction causing severe rash involving mucus membranes or skin necrosis: No Has patient had a PCN reaction that required hospitalization No Has patient had a PCN reaction occurring within the last 10 years: No If all of the above answers are "NO", then may proceed with Cephalosporin use.  . Simvastatin Other (See Comments)    Reaction:  Muscle pain       Family History  Problem Relation Age of Onset  . Diabetes Mother   . Colon cancer Neg Hx   . Colon polyps Neg Hx      Social History Mr. Turvey reports that he quit smoking about 40 years ago. His smoking use included cigarettes. He started smoking about 70 years ago. He has a 15.00 pack-year smoking history. He has never used smokeless tobacco. Mr. Basista reports no history of alcohol use.   Review of Systems CONSTITUTIONAL: No weight loss, fever, chills, weakness or fatigue.  HEENT: Eyes: No visual loss, blurred vision, double vision or yellow sclerae.No hearing loss, sneezing, congestion, runny nose or sore throat.  SKIN: No rash or itching.  CARDIOVASCULAR: no chest pain, no palpitatoins RESPIRATORY: No shortness of breath, cough or sputum.  GASTROINTESTINAL: No anorexia, nausea, vomiting or diarrhea. No abdominal pain or blood.  GENITOURINARY: No burning on urination, no polyuria NEUROLOGICAL: No headache, dizziness, syncope, paralysis, ataxia, numbness or tingling in the extremities. No change in bowel or bladder control.  MUSCULOSKELETAL: No muscle, back pain, joint pain or stiffness.  LYMPHATICS: No enlarged nodes. No history of splenectomy.  PSYCHIATRIC: No history of depression or anxiety.  ENDOCRINOLOGIC: No reports of sweating, cold or heat intolerance. No polyuria or polydipsia.  Marland Kitchen   Physical Examination Vitals:    11/21/18 1430  BP: 114/72  Pulse: 72  SpO2: 93%   Vitals:   11/21/18 1430  Weight: 267 lb 3.2 oz (121.2 kg)  Height: 6' (1.829 m)    Gen: resting comfortably, no acute distress HEENT: no scleral icterus, pupils equal round and reactive, no palptable cervical adenopathy,  CV: RRR, no m/r/g, no jvd Resp: Clear to auscultation bilaterally GI: abdomen is soft, non-tender, non-distended, normal bowel sounds, no hepatosplenomegaly MSK: extremities are warm, 1+ bilateral LE edema Skin: warm, no rash Neuro:  no focal deficits Psych: appropriate affect   Diagnostic Studies     Assessment and Plan   1. Acute on chronic diastolic HF - ongoing edema, he will take torsemide 80mg  in AM and 40mg  in PM x 4 days, then resume his 80mg  daily. He is hesitant to take more regular toresmide due to frequent urination - repeat labs next week - continue compression wrapping with home health   F/u 4 monhts   Antoine Poche, M.D.

## 2018-11-27 DIAGNOSIS — I5030 Unspecified diastolic (congestive) heart failure: Secondary | ICD-10-CM | POA: Diagnosis not present

## 2018-11-27 DIAGNOSIS — S81802D Unspecified open wound, left lower leg, subsequent encounter: Secondary | ICD-10-CM | POA: Diagnosis not present

## 2018-11-27 DIAGNOSIS — R238 Other skin changes: Secondary | ICD-10-CM | POA: Diagnosis not present

## 2018-11-27 DIAGNOSIS — I110 Hypertensive heart disease with heart failure: Secondary | ICD-10-CM | POA: Diagnosis not present

## 2018-11-27 DIAGNOSIS — E119 Type 2 diabetes mellitus without complications: Secondary | ICD-10-CM | POA: Diagnosis not present

## 2018-11-27 DIAGNOSIS — S81801D Unspecified open wound, right lower leg, subsequent encounter: Secondary | ICD-10-CM | POA: Diagnosis not present

## 2018-11-28 ENCOUNTER — Telehealth: Payer: Self-pay | Admitting: Cardiology

## 2018-11-28 DIAGNOSIS — R238 Other skin changes: Secondary | ICD-10-CM | POA: Diagnosis not present

## 2018-11-28 DIAGNOSIS — I110 Hypertensive heart disease with heart failure: Secondary | ICD-10-CM | POA: Diagnosis not present

## 2018-11-28 DIAGNOSIS — S81801D Unspecified open wound, right lower leg, subsequent encounter: Secondary | ICD-10-CM | POA: Diagnosis not present

## 2018-11-28 DIAGNOSIS — I5030 Unspecified diastolic (congestive) heart failure: Secondary | ICD-10-CM | POA: Diagnosis not present

## 2018-11-28 DIAGNOSIS — S81802D Unspecified open wound, left lower leg, subsequent encounter: Secondary | ICD-10-CM | POA: Diagnosis not present

## 2018-11-28 DIAGNOSIS — E119 Type 2 diabetes mellitus without complications: Secondary | ICD-10-CM | POA: Diagnosis not present

## 2018-11-28 MED ORDER — TORSEMIDE 20 MG PO TABS: ORAL_TABLET | ORAL | 1 refills | Status: DC

## 2018-11-28 NOTE — Telephone Encounter (Signed)
Patient informed and verbalized understanding of plan. 

## 2018-11-28 NOTE — Telephone Encounter (Signed)
Patient's son walk in  Wanted to let Dr Wyline Mood know that nurse can not draw his blood.  Has dried for the past two days.  Patient is wanting to reduce his torsemide (DEMADEX) 20 MG tablet   Because he is unable to do blood work

## 2018-11-28 NOTE — Telephone Encounter (Signed)
I dont' think we have to cut back to 20mg  of torsemide. He had been taking 80mg  daily, then took 80mg  in AM and 40mg  in PM for a few days. Can go to torsemide 40mg  daily. Try again for labs next week  Dominga Ferry MD

## 2018-11-28 NOTE — Telephone Encounter (Signed)
Spoke with son's about their concerns. Nurse made attempt to get lab specimen today and wasn't successful. Currently taking torsemide 80 mg daily. No c/o chest pain, dizziness, sob, n/v. Per son, patient's swelling is about the same. Per son, patient is eating but is not drinking enough fluids. Advised son that fluids should be increased and that patient can get labs done at the lab. Per son, patient refuses to go to the lab and will wait for the nurse to come back next week to attempt another blood draw.

## 2018-12-05 DIAGNOSIS — I10 Essential (primary) hypertension: Secondary | ICD-10-CM | POA: Diagnosis not present

## 2018-12-05 DIAGNOSIS — I110 Hypertensive heart disease with heart failure: Secondary | ICD-10-CM | POA: Diagnosis not present

## 2018-12-05 DIAGNOSIS — S81801D Unspecified open wound, right lower leg, subsequent encounter: Secondary | ICD-10-CM | POA: Diagnosis not present

## 2018-12-05 DIAGNOSIS — I5030 Unspecified diastolic (congestive) heart failure: Secondary | ICD-10-CM | POA: Diagnosis not present

## 2018-12-05 DIAGNOSIS — E119 Type 2 diabetes mellitus without complications: Secondary | ICD-10-CM | POA: Diagnosis not present

## 2018-12-05 DIAGNOSIS — R238 Other skin changes: Secondary | ICD-10-CM | POA: Diagnosis not present

## 2018-12-05 DIAGNOSIS — S81802D Unspecified open wound, left lower leg, subsequent encounter: Secondary | ICD-10-CM | POA: Diagnosis not present

## 2018-12-08 ENCOUNTER — Telehealth: Payer: Self-pay | Admitting: Cardiology

## 2018-12-08 NOTE — Telephone Encounter (Signed)
Returning some one's  Call he thinks it is about his lab work

## 2018-12-08 NOTE — Telephone Encounter (Signed)
Patient informed that lab results have not been received yet. Patient said that his lab was drawn on Tuesday by home health and sent to [facility]. Patient advised that once we receive them and they are reviewed by his provider, he would be contacted. Verbalized understanding.

## 2018-12-12 DIAGNOSIS — I5030 Unspecified diastolic (congestive) heart failure: Secondary | ICD-10-CM | POA: Diagnosis not present

## 2018-12-12 DIAGNOSIS — S81802D Unspecified open wound, left lower leg, subsequent encounter: Secondary | ICD-10-CM | POA: Diagnosis not present

## 2018-12-12 DIAGNOSIS — I110 Hypertensive heart disease with heart failure: Secondary | ICD-10-CM | POA: Diagnosis not present

## 2018-12-12 DIAGNOSIS — R238 Other skin changes: Secondary | ICD-10-CM | POA: Diagnosis not present

## 2018-12-12 DIAGNOSIS — S81801D Unspecified open wound, right lower leg, subsequent encounter: Secondary | ICD-10-CM | POA: Diagnosis not present

## 2018-12-12 DIAGNOSIS — E119 Type 2 diabetes mellitus without complications: Secondary | ICD-10-CM | POA: Diagnosis not present

## 2018-12-14 ENCOUNTER — Telehealth: Payer: Self-pay | Admitting: *Deleted

## 2018-12-14 NOTE — Telephone Encounter (Signed)
Patient informed and says the swelling in his legs has improved and his home weight has been steady at 258 lbs.

## 2018-12-14 NOTE — Telephone Encounter (Signed)
-----   Message from Antoine Poche, MD sent at 12/14/2018 12:10 PM EST ----- Regarding: Labs Labs received and look fine. How is his swelling and weights doing?    Dominga Ferry MD

## 2018-12-18 DIAGNOSIS — K5900 Constipation, unspecified: Secondary | ICD-10-CM | POA: Diagnosis not present

## 2018-12-18 DIAGNOSIS — R2689 Other abnormalities of gait and mobility: Secondary | ICD-10-CM | POA: Diagnosis not present

## 2018-12-18 DIAGNOSIS — M6281 Muscle weakness (generalized): Secondary | ICD-10-CM | POA: Diagnosis not present

## 2018-12-18 DIAGNOSIS — L219 Seborrheic dermatitis, unspecified: Secondary | ICD-10-CM | POA: Diagnosis not present

## 2018-12-18 DIAGNOSIS — E118 Type 2 diabetes mellitus with unspecified complications: Secondary | ICD-10-CM | POA: Diagnosis not present

## 2018-12-18 DIAGNOSIS — M48061 Spinal stenosis, lumbar region without neurogenic claudication: Secondary | ICD-10-CM | POA: Diagnosis not present

## 2018-12-18 DIAGNOSIS — R1312 Dysphagia, oropharyngeal phase: Secondary | ICD-10-CM | POA: Diagnosis not present

## 2018-12-18 DIAGNOSIS — I5022 Chronic systolic (congestive) heart failure: Secondary | ICD-10-CM | POA: Diagnosis not present

## 2018-12-19 DIAGNOSIS — M48061 Spinal stenosis, lumbar region without neurogenic claudication: Secondary | ICD-10-CM | POA: Diagnosis not present

## 2018-12-19 DIAGNOSIS — E119 Type 2 diabetes mellitus without complications: Secondary | ICD-10-CM | POA: Diagnosis not present

## 2018-12-19 DIAGNOSIS — M6281 Muscle weakness (generalized): Secondary | ICD-10-CM | POA: Diagnosis not present

## 2018-12-19 DIAGNOSIS — E039 Hypothyroidism, unspecified: Secondary | ICD-10-CM | POA: Diagnosis not present

## 2018-12-19 DIAGNOSIS — R2689 Other abnormalities of gait and mobility: Secondary | ICD-10-CM | POA: Diagnosis not present

## 2018-12-19 DIAGNOSIS — E118 Type 2 diabetes mellitus with unspecified complications: Secondary | ICD-10-CM | POA: Diagnosis not present

## 2018-12-19 DIAGNOSIS — I5032 Chronic diastolic (congestive) heart failure: Secondary | ICD-10-CM | POA: Diagnosis not present

## 2018-12-19 DIAGNOSIS — G4733 Obstructive sleep apnea (adult) (pediatric): Secondary | ICD-10-CM | POA: Diagnosis not present

## 2018-12-19 DIAGNOSIS — D649 Anemia, unspecified: Secondary | ICD-10-CM | POA: Diagnosis not present

## 2018-12-19 DIAGNOSIS — I5022 Chronic systolic (congestive) heart failure: Secondary | ICD-10-CM | POA: Diagnosis not present

## 2018-12-19 DIAGNOSIS — I10 Essential (primary) hypertension: Secondary | ICD-10-CM | POA: Diagnosis not present

## 2018-12-19 DIAGNOSIS — R1312 Dysphagia, oropharyngeal phase: Secondary | ICD-10-CM | POA: Diagnosis not present

## 2018-12-20 DIAGNOSIS — M6281 Muscle weakness (generalized): Secondary | ICD-10-CM | POA: Diagnosis not present

## 2018-12-20 DIAGNOSIS — R2689 Other abnormalities of gait and mobility: Secondary | ICD-10-CM | POA: Diagnosis not present

## 2018-12-20 DIAGNOSIS — R1312 Dysphagia, oropharyngeal phase: Secondary | ICD-10-CM | POA: Diagnosis not present

## 2018-12-20 DIAGNOSIS — M48061 Spinal stenosis, lumbar region without neurogenic claudication: Secondary | ICD-10-CM | POA: Diagnosis not present

## 2018-12-21 DIAGNOSIS — R2689 Other abnormalities of gait and mobility: Secondary | ICD-10-CM | POA: Diagnosis not present

## 2018-12-21 DIAGNOSIS — M6281 Muscle weakness (generalized): Secondary | ICD-10-CM | POA: Diagnosis not present

## 2018-12-21 DIAGNOSIS — E039 Hypothyroidism, unspecified: Secondary | ICD-10-CM | POA: Diagnosis not present

## 2018-12-21 DIAGNOSIS — M48061 Spinal stenosis, lumbar region without neurogenic claudication: Secondary | ICD-10-CM | POA: Diagnosis not present

## 2018-12-21 DIAGNOSIS — R1312 Dysphagia, oropharyngeal phase: Secondary | ICD-10-CM | POA: Diagnosis not present

## 2018-12-22 DIAGNOSIS — R1312 Dysphagia, oropharyngeal phase: Secondary | ICD-10-CM | POA: Diagnosis not present

## 2018-12-22 DIAGNOSIS — R2689 Other abnormalities of gait and mobility: Secondary | ICD-10-CM | POA: Diagnosis not present

## 2018-12-22 DIAGNOSIS — M6281 Muscle weakness (generalized): Secondary | ICD-10-CM | POA: Diagnosis not present

## 2018-12-22 DIAGNOSIS — M48061 Spinal stenosis, lumbar region without neurogenic claudication: Secondary | ICD-10-CM | POA: Diagnosis not present

## 2018-12-25 DIAGNOSIS — R609 Edema, unspecified: Secondary | ICD-10-CM | POA: Diagnosis not present

## 2018-12-25 DIAGNOSIS — M48061 Spinal stenosis, lumbar region without neurogenic claudication: Secondary | ICD-10-CM | POA: Diagnosis not present

## 2018-12-25 DIAGNOSIS — E118 Type 2 diabetes mellitus with unspecified complications: Secondary | ICD-10-CM | POA: Diagnosis not present

## 2018-12-25 DIAGNOSIS — M6281 Muscle weakness (generalized): Secondary | ICD-10-CM | POA: Diagnosis not present

## 2018-12-25 DIAGNOSIS — G629 Polyneuropathy, unspecified: Secondary | ICD-10-CM | POA: Diagnosis not present

## 2018-12-25 DIAGNOSIS — R2689 Other abnormalities of gait and mobility: Secondary | ICD-10-CM | POA: Diagnosis not present

## 2018-12-25 DIAGNOSIS — R1312 Dysphagia, oropharyngeal phase: Secondary | ICD-10-CM | POA: Diagnosis not present

## 2018-12-25 DIAGNOSIS — F329 Major depressive disorder, single episode, unspecified: Secondary | ICD-10-CM | POA: Diagnosis not present

## 2018-12-26 DIAGNOSIS — M48061 Spinal stenosis, lumbar region without neurogenic claudication: Secondary | ICD-10-CM | POA: Diagnosis not present

## 2018-12-26 DIAGNOSIS — R1312 Dysphagia, oropharyngeal phase: Secondary | ICD-10-CM | POA: Diagnosis not present

## 2018-12-26 DIAGNOSIS — M6281 Muscle weakness (generalized): Secondary | ICD-10-CM | POA: Diagnosis not present

## 2018-12-26 DIAGNOSIS — R2689 Other abnormalities of gait and mobility: Secondary | ICD-10-CM | POA: Diagnosis not present

## 2018-12-27 DIAGNOSIS — E118 Type 2 diabetes mellitus with unspecified complications: Secondary | ICD-10-CM | POA: Diagnosis not present

## 2018-12-27 DIAGNOSIS — R2689 Other abnormalities of gait and mobility: Secondary | ICD-10-CM | POA: Diagnosis not present

## 2018-12-27 DIAGNOSIS — J449 Chronic obstructive pulmonary disease, unspecified: Secondary | ICD-10-CM | POA: Diagnosis not present

## 2018-12-27 DIAGNOSIS — I5032 Chronic diastolic (congestive) heart failure: Secondary | ICD-10-CM | POA: Diagnosis not present

## 2018-12-27 DIAGNOSIS — I10 Essential (primary) hypertension: Secondary | ICD-10-CM | POA: Diagnosis not present

## 2018-12-27 DIAGNOSIS — M6281 Muscle weakness (generalized): Secondary | ICD-10-CM | POA: Diagnosis not present

## 2018-12-27 DIAGNOSIS — M48061 Spinal stenosis, lumbar region without neurogenic claudication: Secondary | ICD-10-CM | POA: Diagnosis not present

## 2018-12-27 DIAGNOSIS — R1312 Dysphagia, oropharyngeal phase: Secondary | ICD-10-CM | POA: Diagnosis not present

## 2018-12-28 DIAGNOSIS — R1312 Dysphagia, oropharyngeal phase: Secondary | ICD-10-CM | POA: Diagnosis not present

## 2018-12-28 DIAGNOSIS — M6281 Muscle weakness (generalized): Secondary | ICD-10-CM | POA: Diagnosis not present

## 2018-12-28 DIAGNOSIS — R2689 Other abnormalities of gait and mobility: Secondary | ICD-10-CM | POA: Diagnosis not present

## 2018-12-28 DIAGNOSIS — M48061 Spinal stenosis, lumbar region without neurogenic claudication: Secondary | ICD-10-CM | POA: Diagnosis not present

## 2018-12-28 IMAGING — DX DG CHEST 1V
1 series · 1 of 1 positions shown · non-contrast
Comparison: Radiographs 04/20/2016

CLINICAL DATA: Fall today in car port. Left upper extremity pain
most prominent at the shoulder with multiple skin tears.

EXAM:
CHEST 1 VIEW

[chest ap]
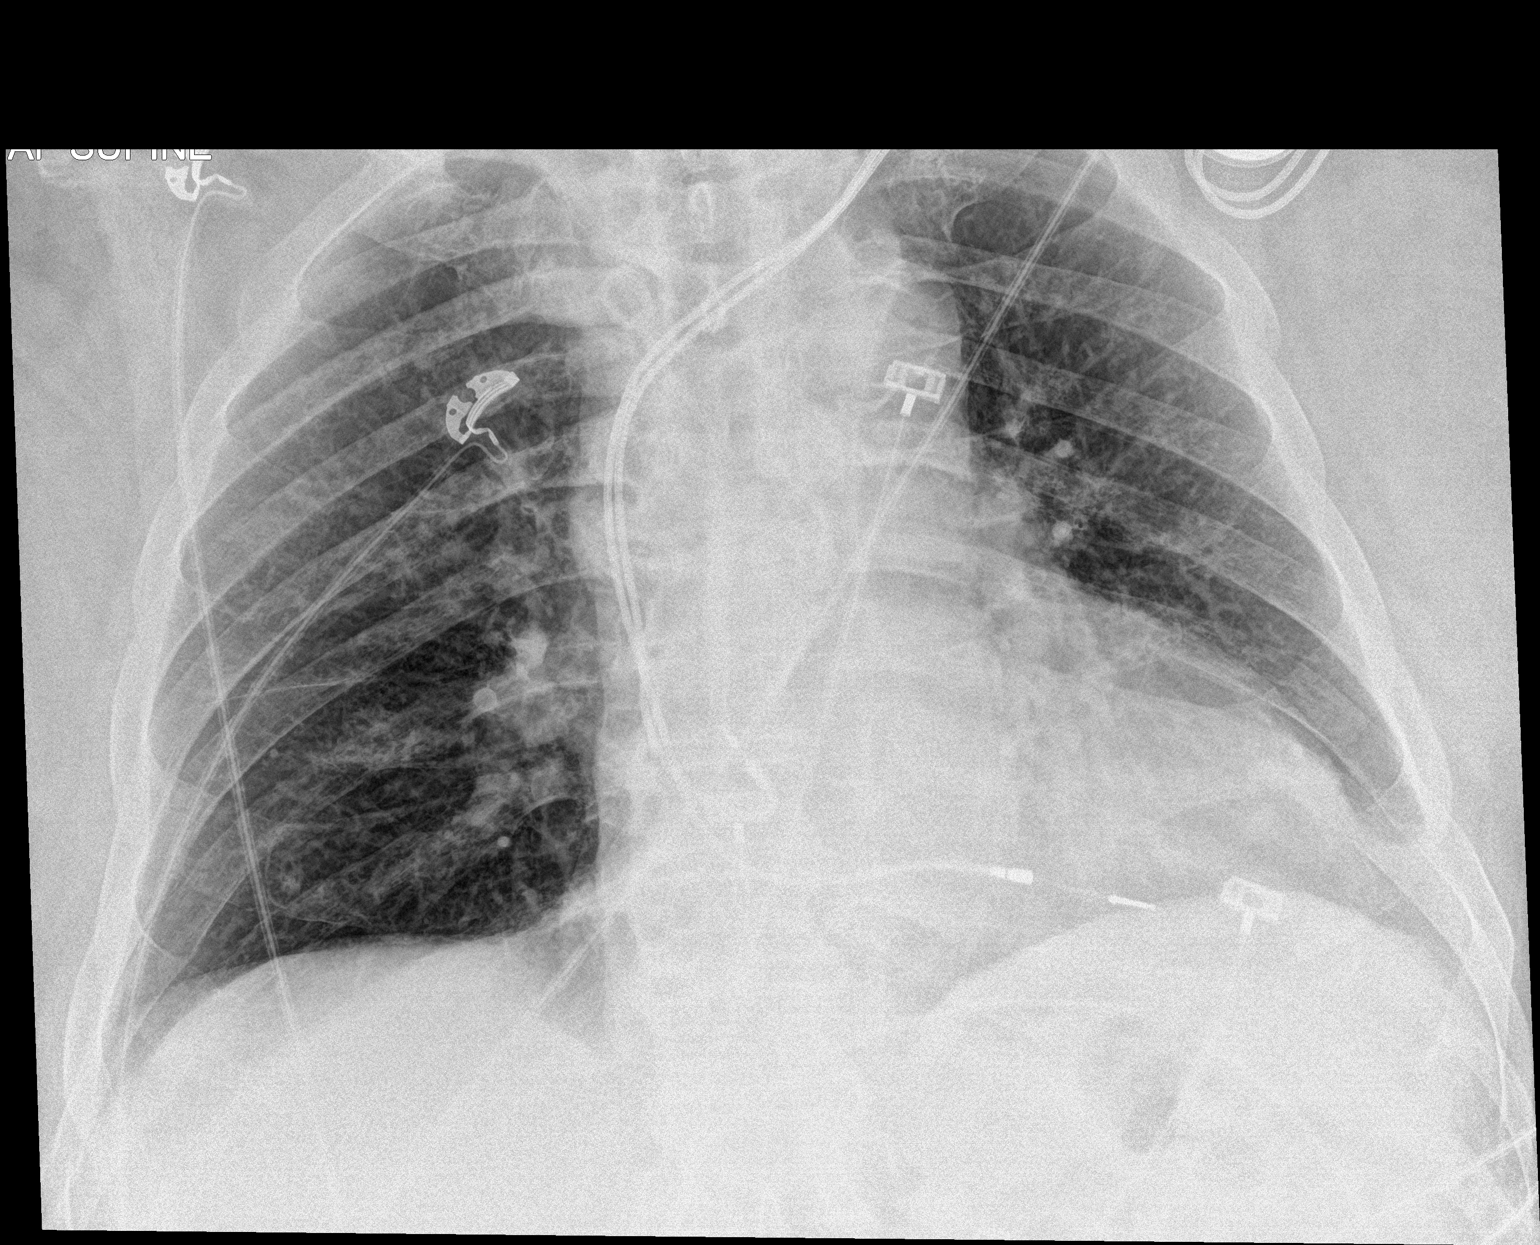

[1 of 1 positions shown; findings below may reference images not displayed]

FINDINGS: Left-sided pacemaker in place, leads projecting over the right
atrium and ventricle.The cardiomediastinal contours are unchanged
with stable cardiomegaly. Pulmonary vasculature is normal. No
consolidation, pleural effusion, or pneumothorax. No acute osseous
abnormalities are seen. Left proximal humerus fracture not well
visualized.
IMPRESSION: Stable cardiomegaly.  No evidence of acute or traumatic abnormality.

## 2018-12-28 IMAGING — CT CT HEAD W/O CM
3 series · 15 of 47 positions shown, 18 images · non-contrast
Comparison: None.

CLINICAL DATA: Fall today with head injury.

EXAM:
CT HEAD WITHOUT CONTRAST
TECHNIQUE: Contiguous axial images were obtained from the base of the skull
through the vertex without intravenous contrast.

[Series 3: head trauma wo · axial · 0.46mm/px · z∈[+2,+142]mm · 9 of 34 slices shown, 12 images]
[im 3/34  brain]
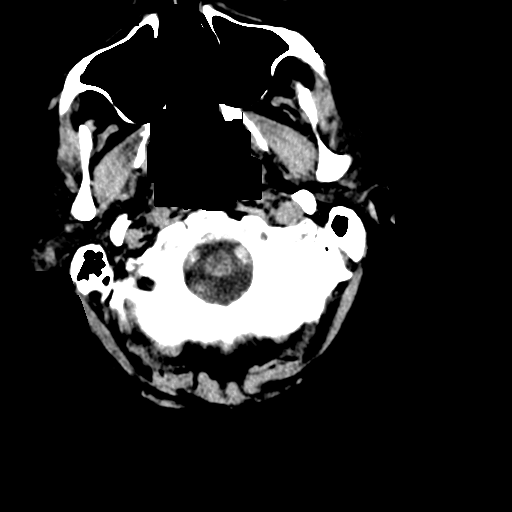
[im 3/34  bone]
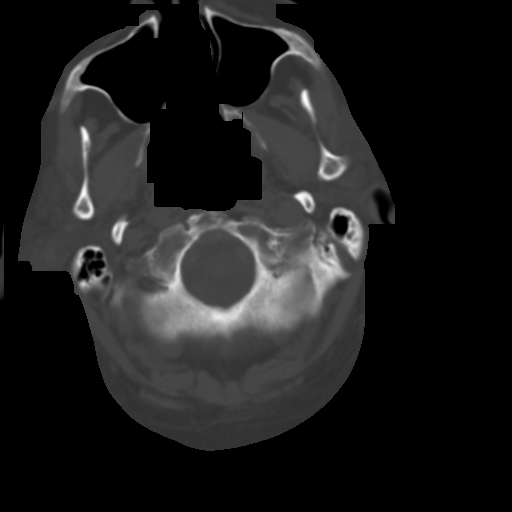
[im 6/34  brain]
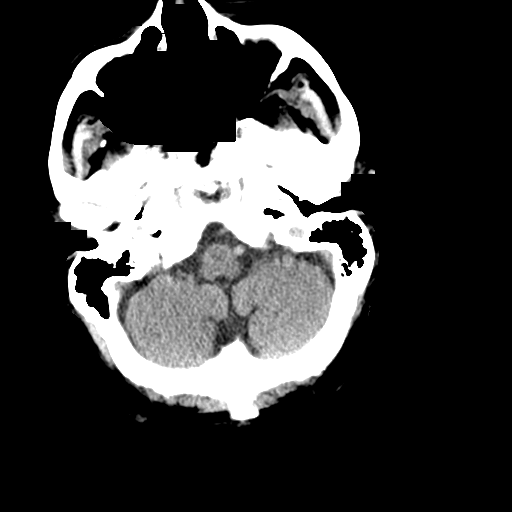
[im 10/34  brain]
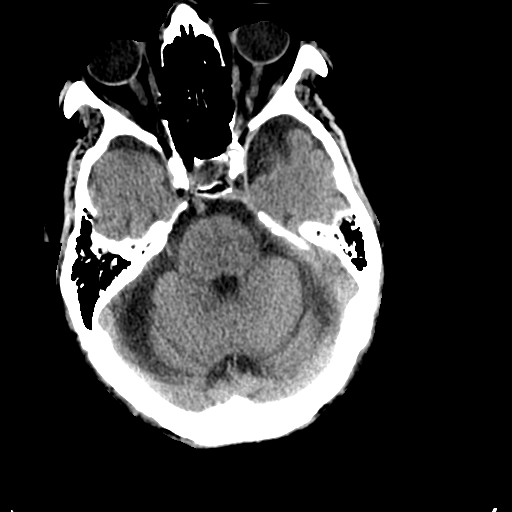
[im 13/34  brain]
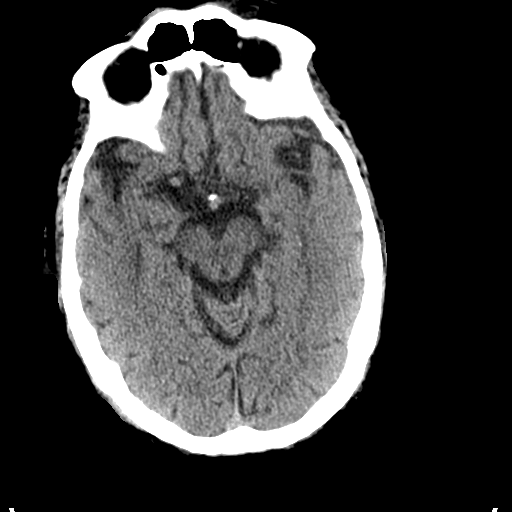
[im 18/34  brain]
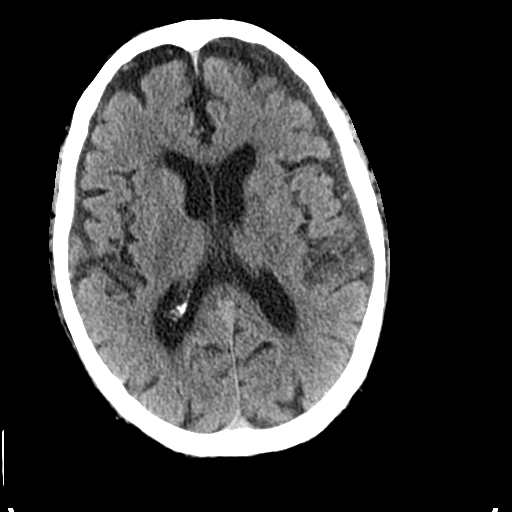
[im 18/34  bone]
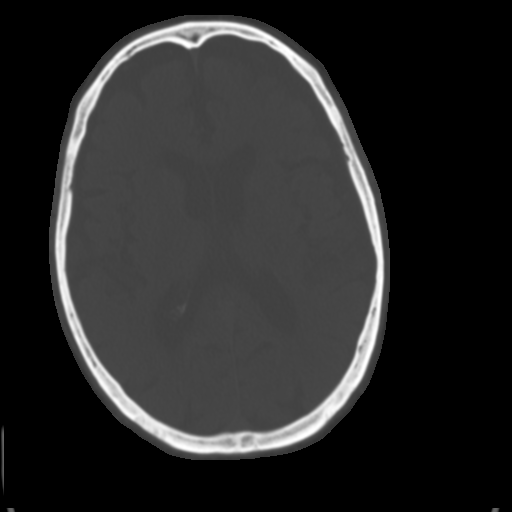
[im 21/34  brain]
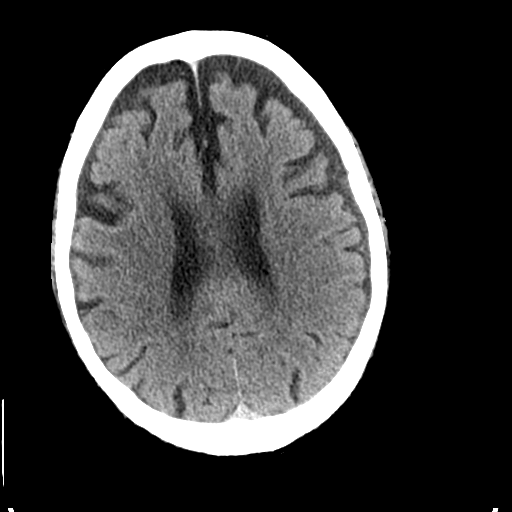
[im 24/34  brain]
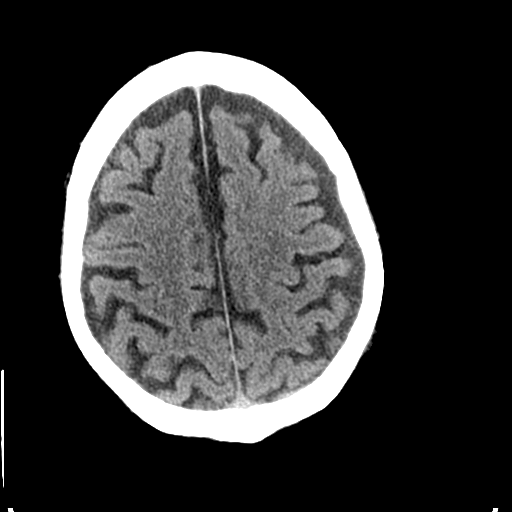
[im 28/34  brain]
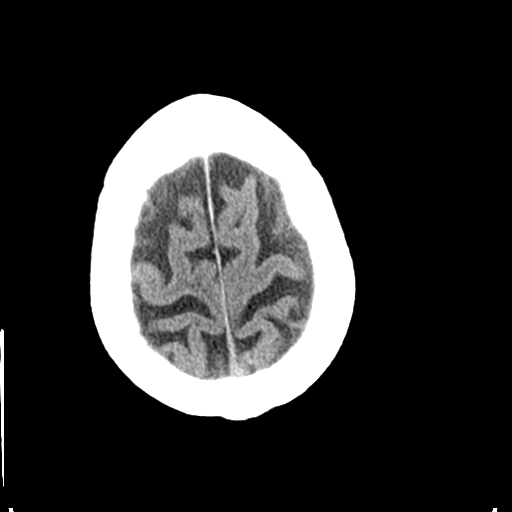
[im 31/34  brain]
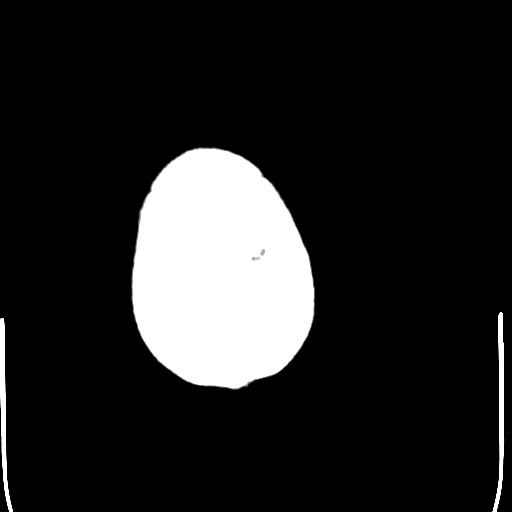
[im 31/34  bone]
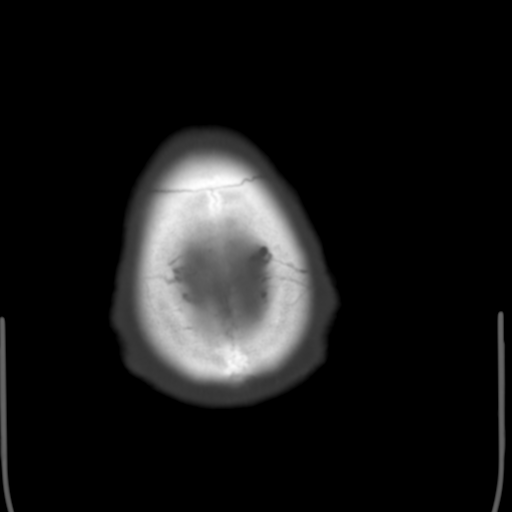

[Series 5: coronal soft tissue · coronal · 0.33mm/px · 3 of 83 slices shown]
[im 28/83  brain]
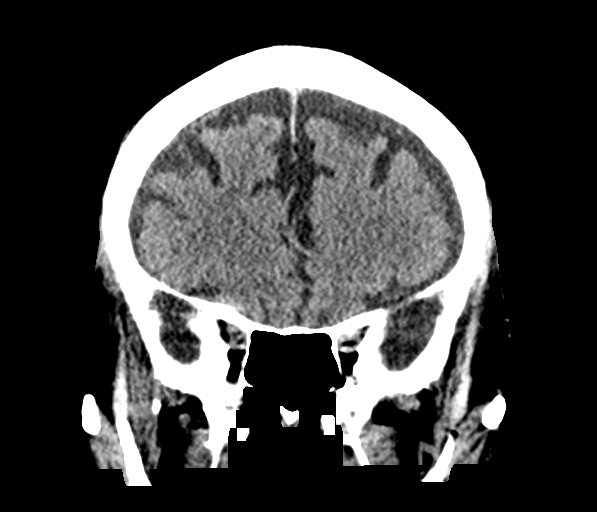
[im 37/83  brain]
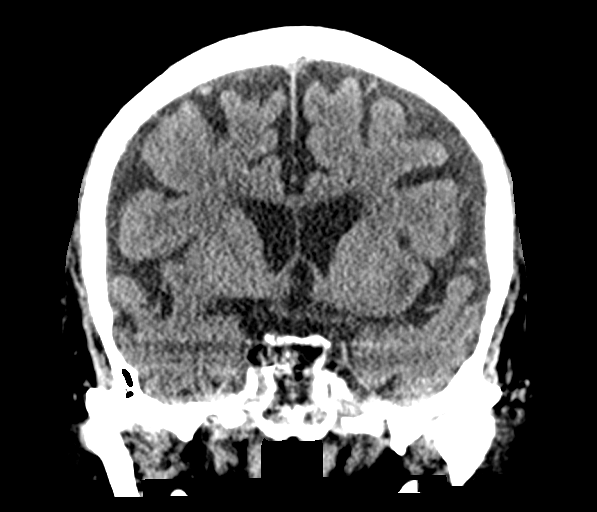
[im 46/83  brain]
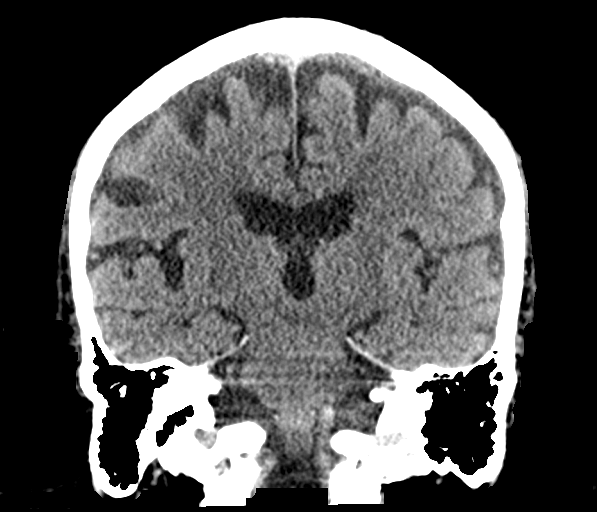

[Series 6: sagittal soft tissue · sagittal · 0.33mm/px · 3 of 66 slices shown]
[im 22/66  brain]
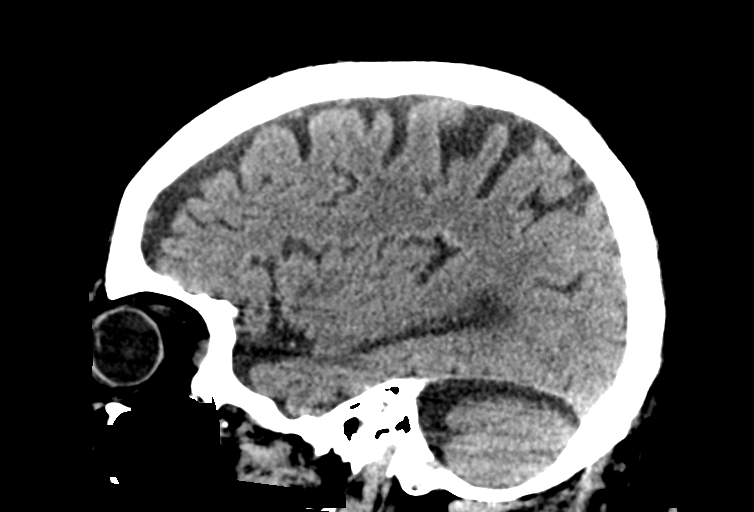
[im 33/66  brain]
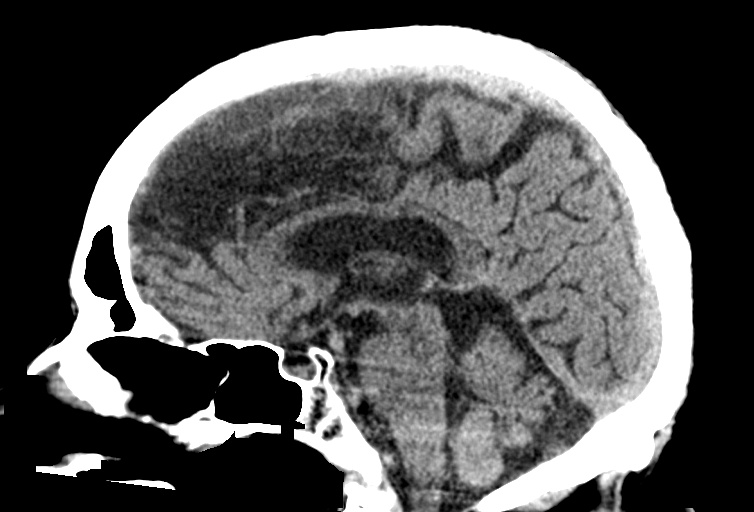
[im 44/66  brain]
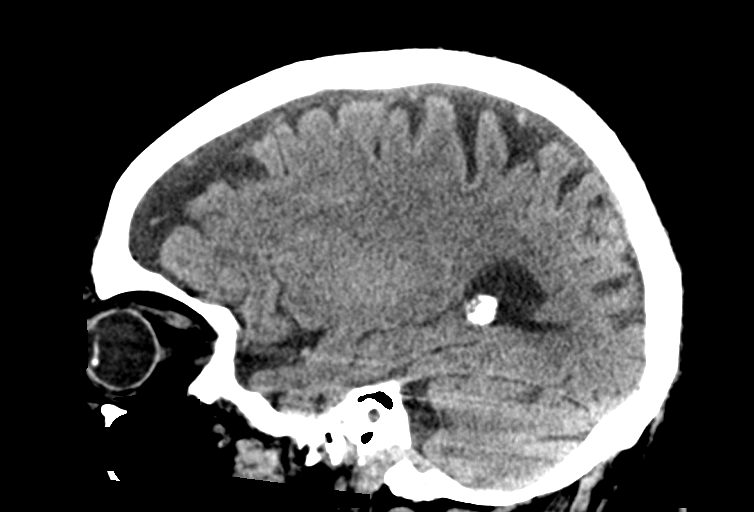

[15 of 47 positions shown; findings below may reference images not displayed]

FINDINGS: Brain: Generalized cerebral and cerebellar atrophy. Remote lacunar
infarct in the left caudate. Mild for age chronic small vessel
ischemic change. No intracranial hemorrhage, subdural or extra-axial
fluid collection. No mass effect or midline shift.

Vascular: Atherosclerosis of skullbase vasculature without
hyperdense vessel or abnormal calcification.

Skull: No skull fracture.  No focal lesion.

Sinuses/Orbits: Paranasal sinuses and mastoid air cells are clear.
The visualized orbits are unremarkable. Bilateral cataract
resection.

Other: None.
IMPRESSION: 1.  No acute intracranial abnormality.
2. Atrophy and chronic small vessel ischemia. Remote lacunar infarct
in the left caudate.

## 2018-12-29 DIAGNOSIS — M6281 Muscle weakness (generalized): Secondary | ICD-10-CM | POA: Diagnosis not present

## 2018-12-29 DIAGNOSIS — R1312 Dysphagia, oropharyngeal phase: Secondary | ICD-10-CM | POA: Diagnosis not present

## 2018-12-29 DIAGNOSIS — M48061 Spinal stenosis, lumbar region without neurogenic claudication: Secondary | ICD-10-CM | POA: Diagnosis not present

## 2018-12-29 DIAGNOSIS — R2689 Other abnormalities of gait and mobility: Secondary | ICD-10-CM | POA: Diagnosis not present

## 2018-12-29 IMAGING — CR DG SHOULDER 2+V*L*
1 series · 3 of 3 positions shown · non-contrast
Comparison: 03/05/2017

CLINICAL DATA: Fell yesterday, pt in left shoulder immobilizer with
proximal humerus fx, no Y-view performed on 03/05/17

EXAM:
LEFT SHOULDER - 2+ VIEW

[Series 1: ap · 0.17mm/px · 3 of 3 slices shown]
[im 1/3]
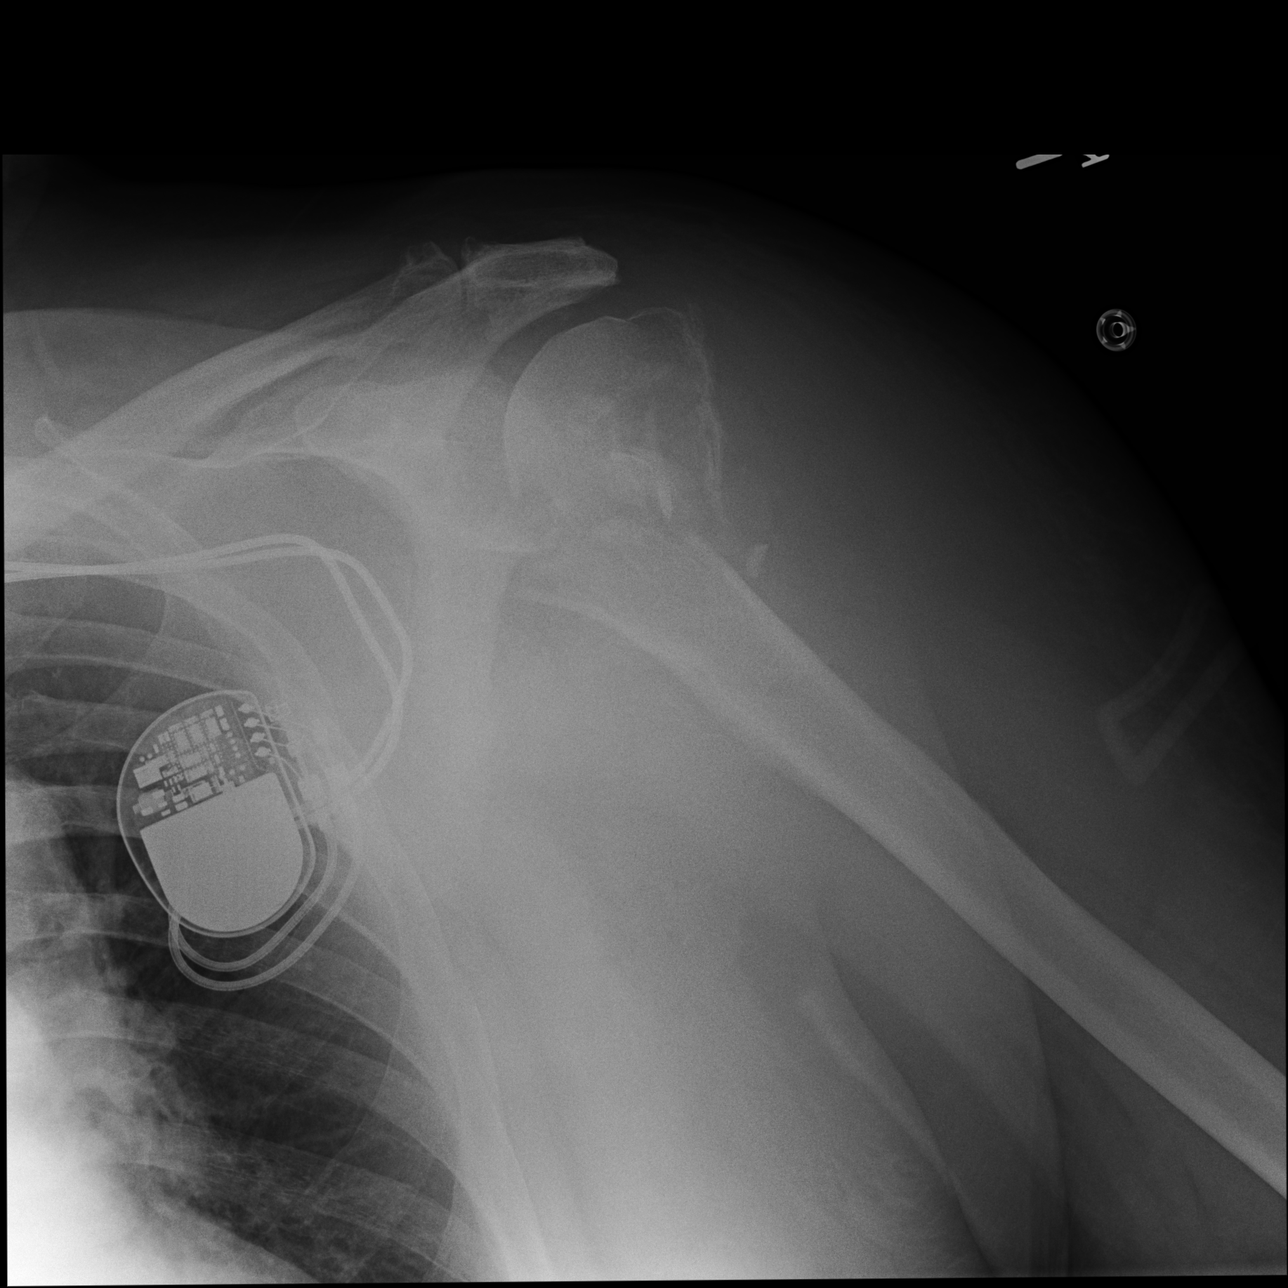
[im 2/3]
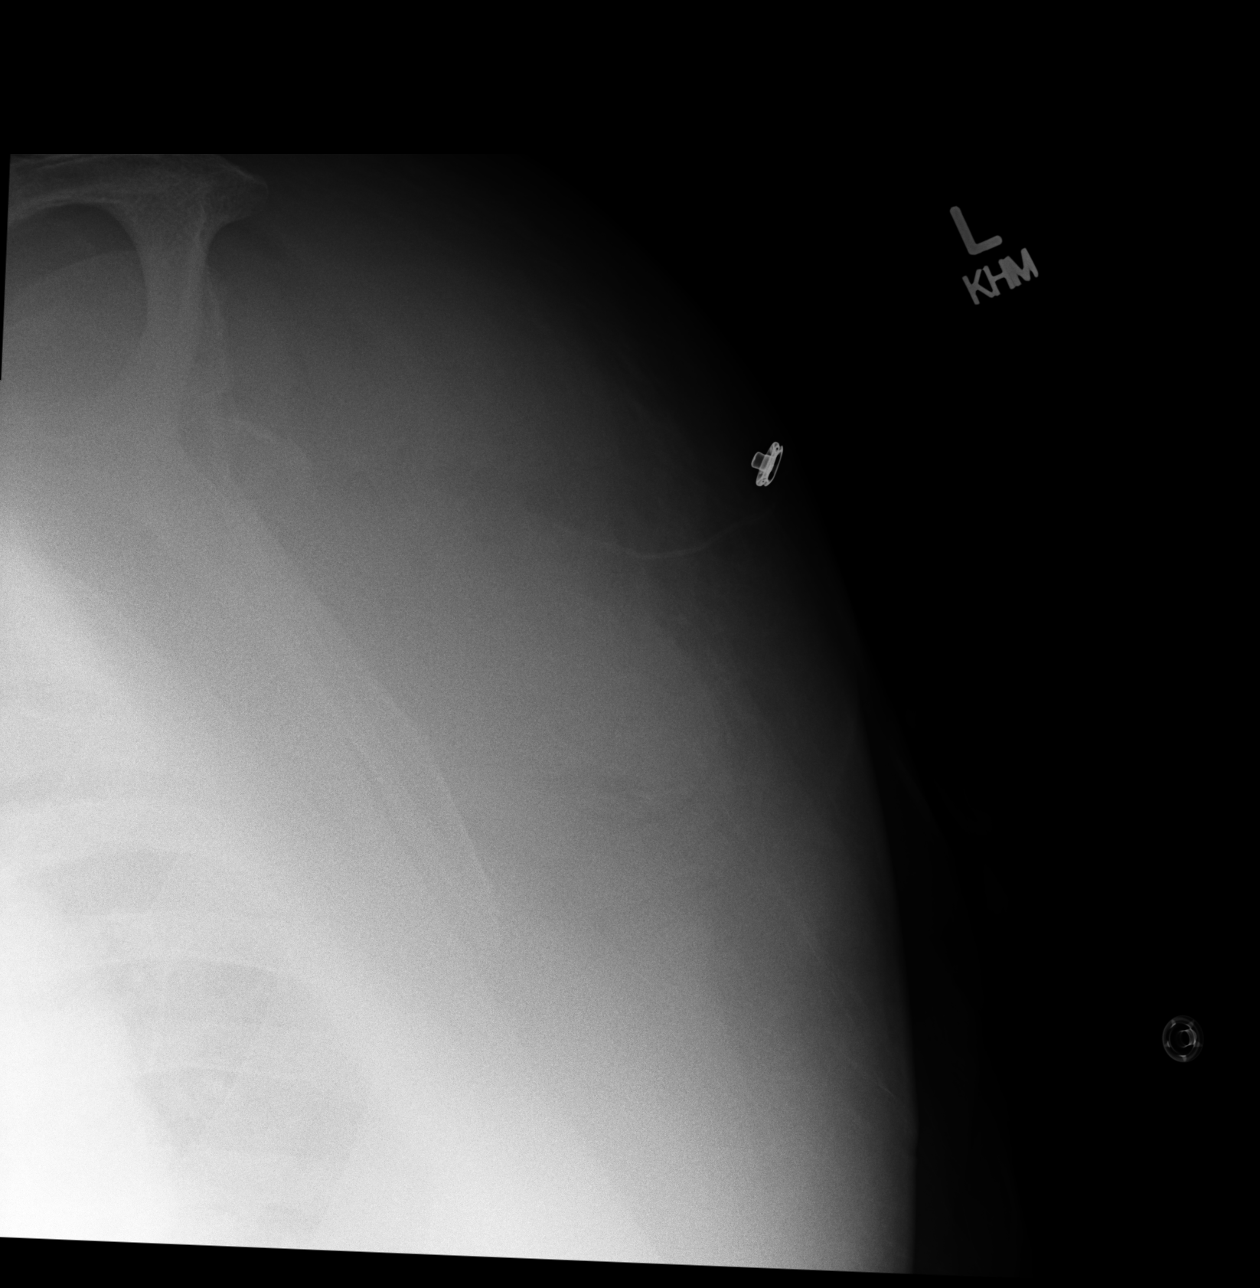
[im 3/3]
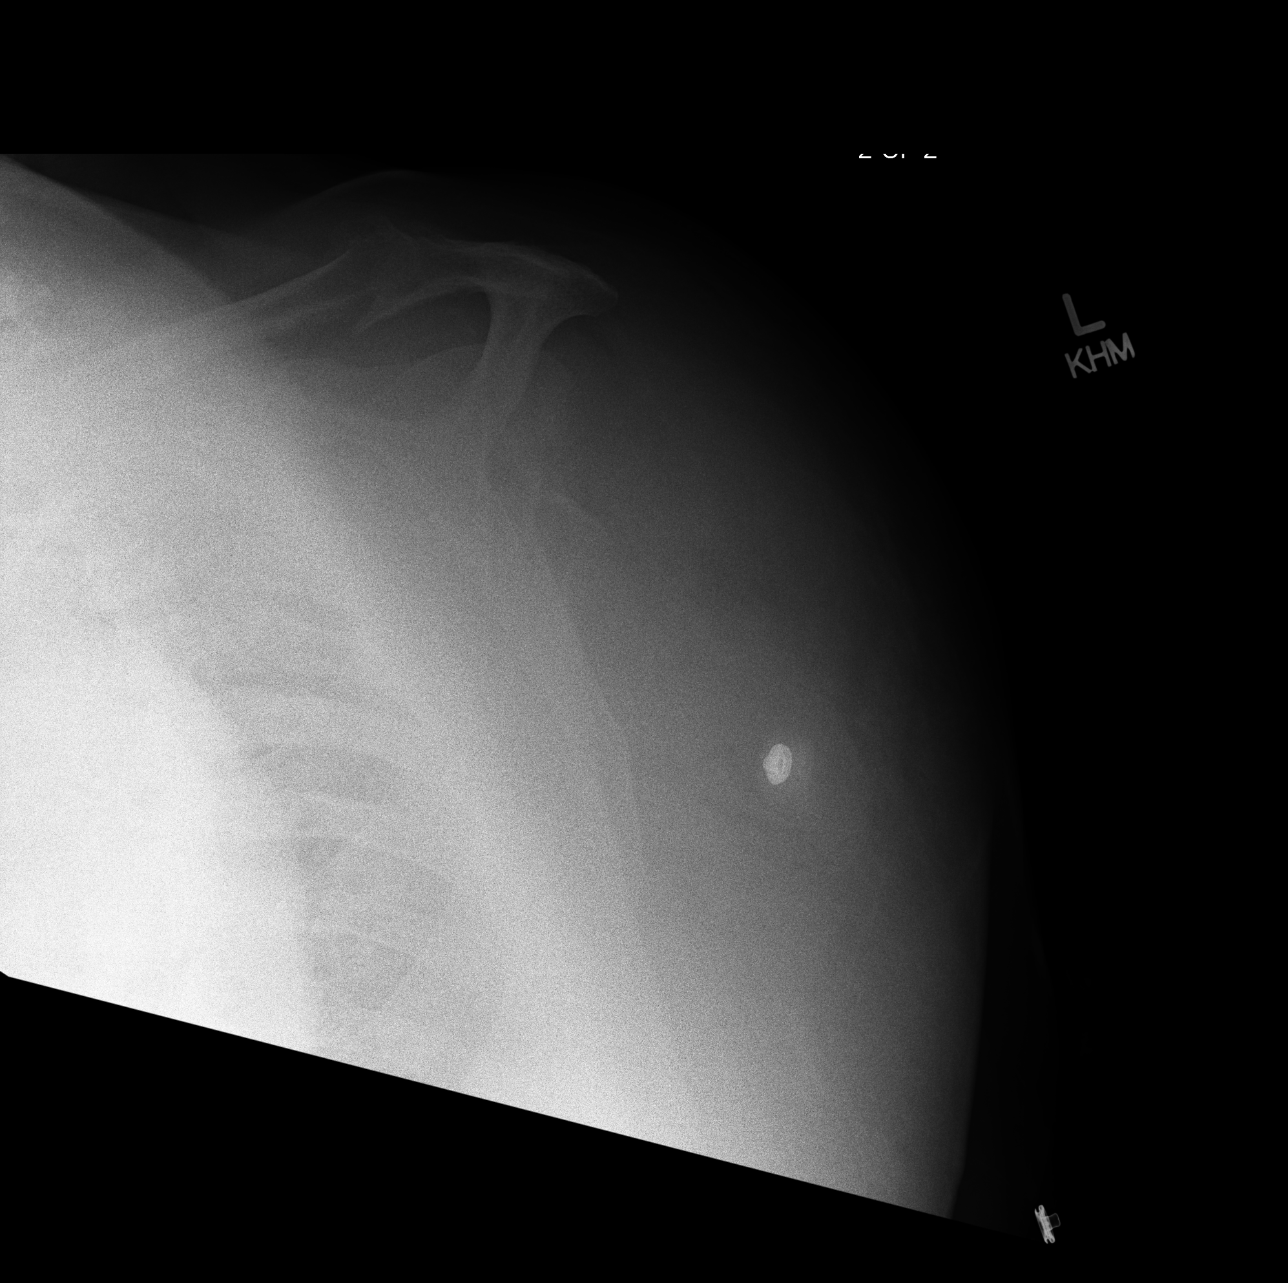

[3 of 3 positions shown; findings below may reference images not displayed]

FINDINGS: Comminuted fracture of the proximal left humerus is again noted. The
humeral head component remains normally aligned with the glenoid.
The shaft fracture component is displaced anteriorly and medially by
just over 2 cm.

There is surrounding soft tissue swelling.

Bones are diffusely demineralized.
IMPRESSION: 1. Comminuted proximal left humeral fracture appears less displaced
than on the previous day's study.
There is no dislocation. Humeral head is normally aligned with the
glenoid.

## 2018-12-31 IMAGING — CT CT SHOULDER*L* W/O CM
3 of 4 series · 11 of 33 positions shown, 13 images · non-contrast
Comparison: Radiographs 03/06/2017

CLINICAL DATA: Fall 03/05/2017. Evaluate proximal left humeral
fracture.

EXAM:
CT OF THE UPPER LEFT EXTREMITY WITHOUT CONTRAST
TECHNIQUE: Multidetector CT imaging of the left shoulder was performed
according to the standard protocol.

[Series 7: ax st · axial · 0.43mm/px · z∈[+1441,+1629]mm · 3 of 95 slices shown, 4 images]
[im 1/95  soft-tissue]
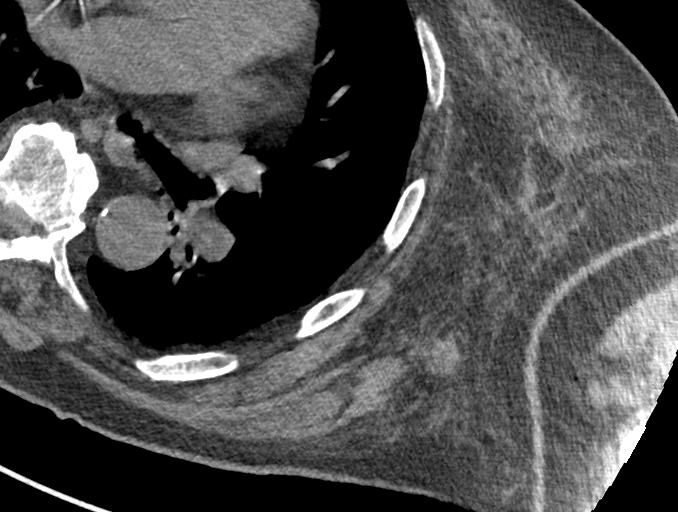
[im 1/95  bone]
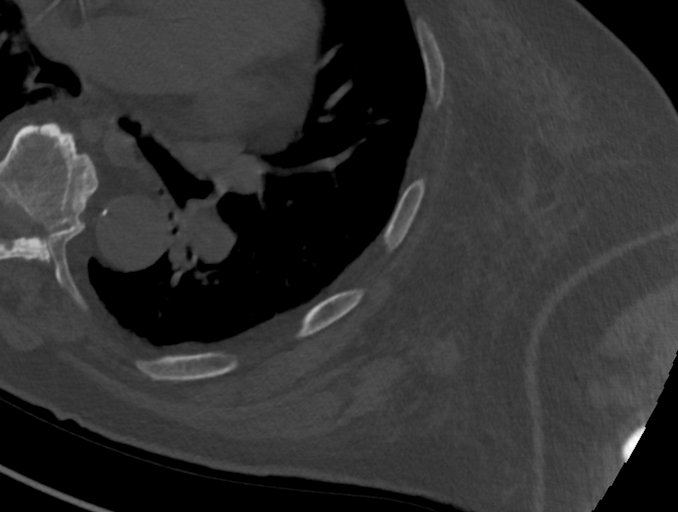
[im 48/95  bone]
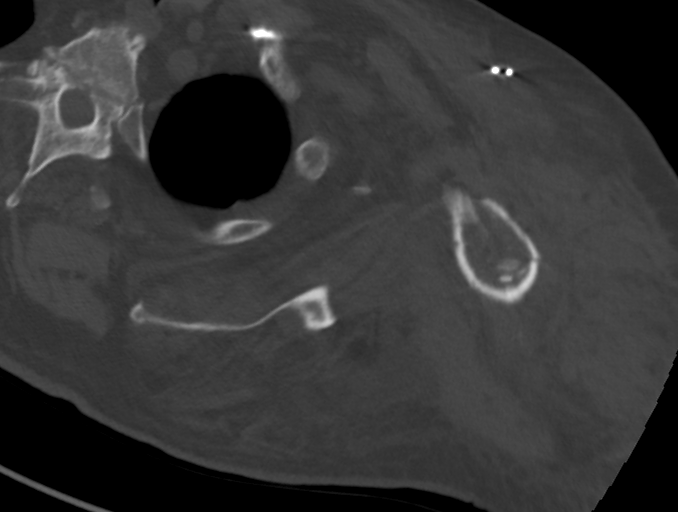
[im 95/95  bone]
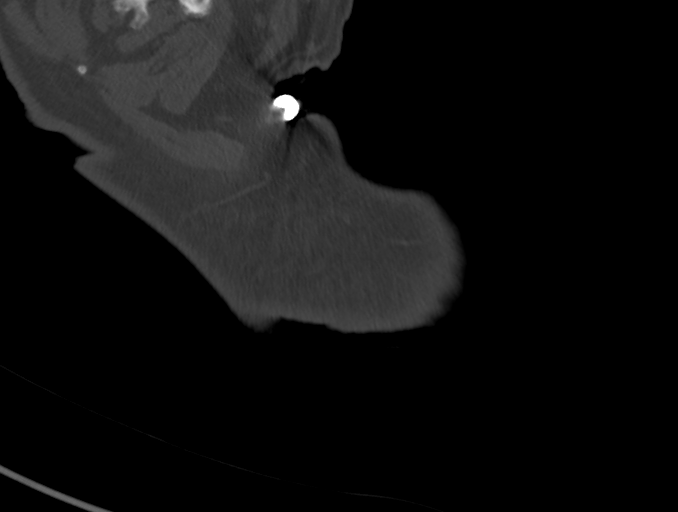

[Series 8: cor st · coronal · 0.38mm/px · 3 of 107 slices shown]
[im 22/107  bone]
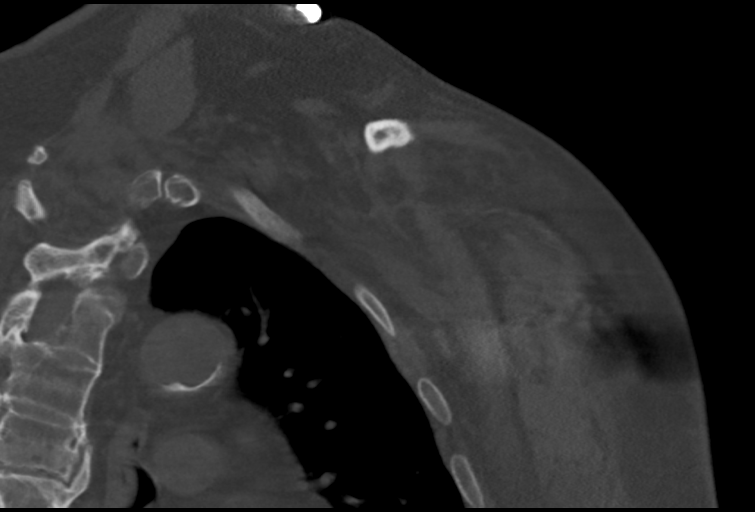
[im 43/107  bone]
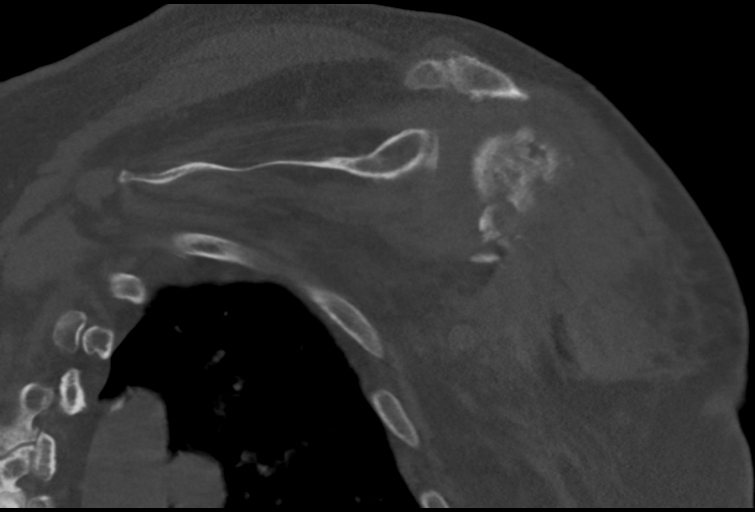
[im 64/107  bone]
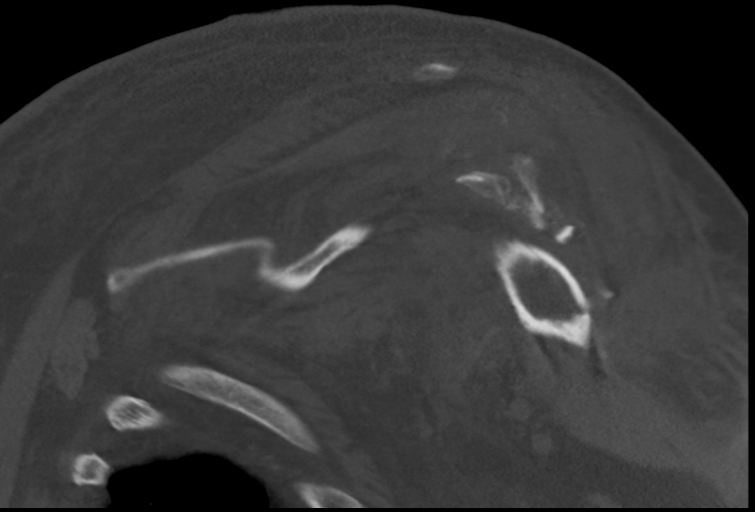

[Series 11: sag st · sagittal · 0.43mm/px · 5 of 135 slices shown, 6 images]
[im 45/135  bone]
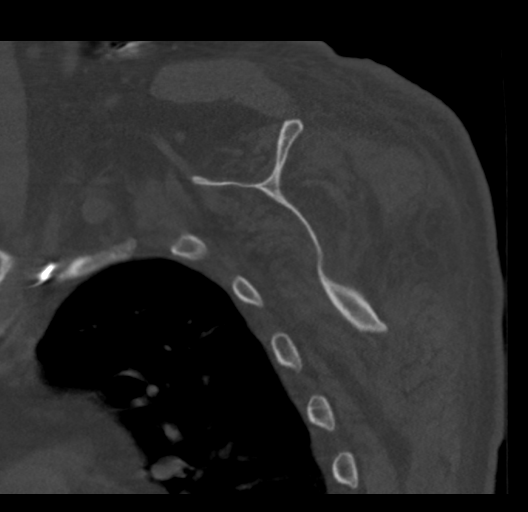
[im 56/135  bone]
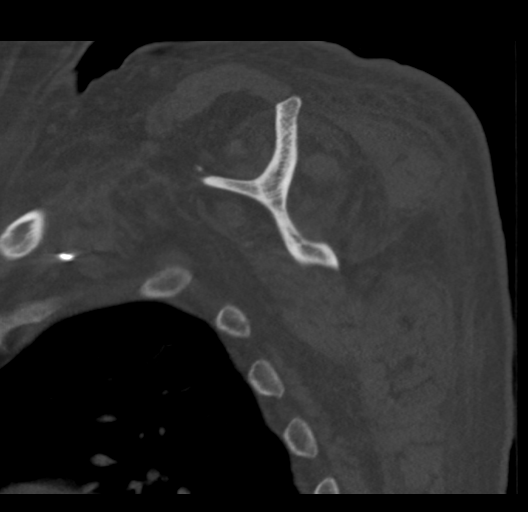
[im 68/135  soft-tissue]
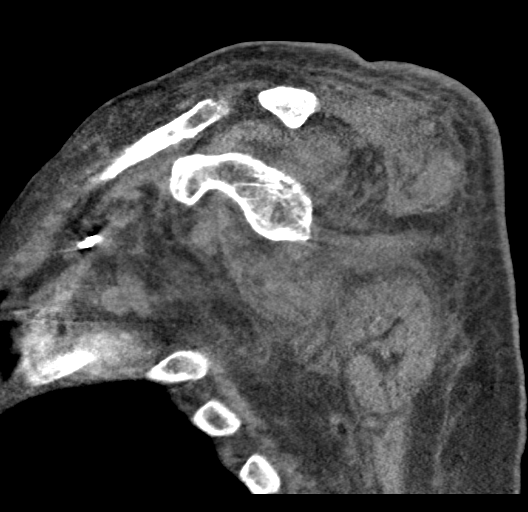
[im 68/135  bone]
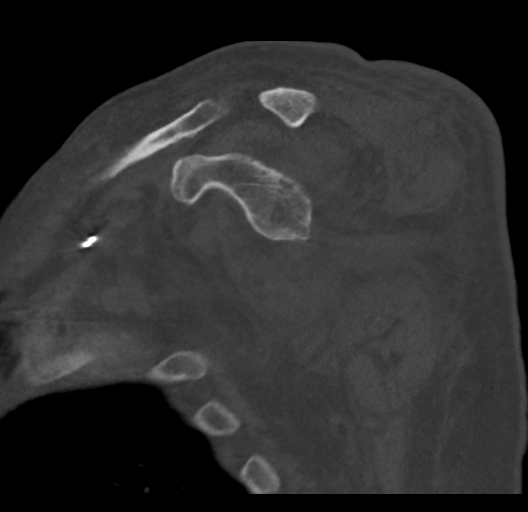
[im 79/135  bone]
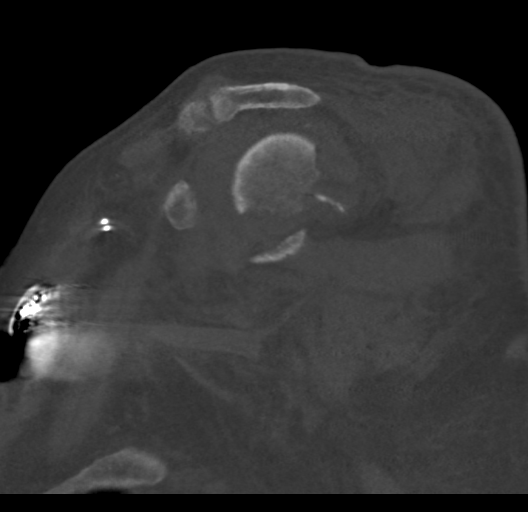
[im 90/135  bone]
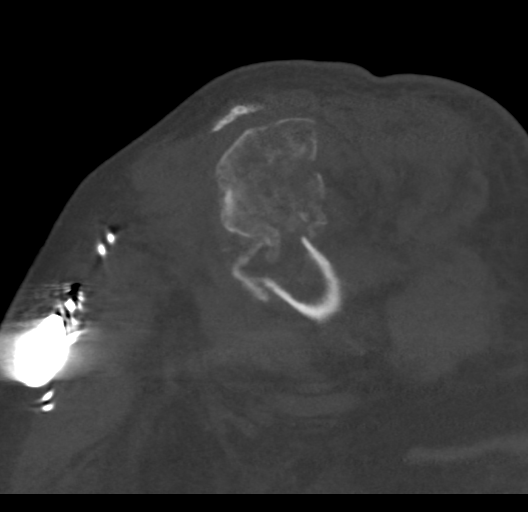

[11 of 33 positions shown; findings below may reference images not displayed]

FINDINGS: Bones/Joint/Cartilage

There is an extensively comminuted fracture of left humeral neck and
proximal diaphysis. This fracture extends into the tuberosities and
also involves the humeral head superiorly and inferiorly. The
inferior component is displaced medially by 14 mm, involving the
articular surface. The humeral head is located.

There is a nondisplaced fracture involving the coracoid process.
Glenohumeral degenerative changes are present with probable superior
labral ossification. No definite glenoid fracture. There are
moderate acromioclavicular degenerative changes with narrowing of
the subacromial space.

Degenerative changes are present throughout the cervicothoracic
spine with associated ankylosis

Ligaments

Not relevant for exam/indication.

Muscles and Tendons

There is advanced atrophy of the left shoulder rotator cuff
musculature, especially the subscapularis, supraspinatus and
infraspinatus muscles. Subacromial calcification is likely related
to a chronic rotator cuff tear.

Soft tissues

There is heterogeneous enlargement of the deltoid muscle laterally
consistent with edema/hemorrhage. There is mild overlying
subcutaneous edema. Patient has a left subclavian pacemaker.
IMPRESSION: 1. Comminuted and moderately displaced intra-articular fracture of
the left humeral neck. This fracture involves the inferior and
superior aspects of the humeral head articular surface.
2. Nondisplaced fracture of the coracoid process.
3. Evidence of underlying chronic rotator cuff tear with associated
muscular atrophy.

## 2019-01-01 DIAGNOSIS — M48061 Spinal stenosis, lumbar region without neurogenic claudication: Secondary | ICD-10-CM | POA: Diagnosis not present

## 2019-01-01 DIAGNOSIS — R1312 Dysphagia, oropharyngeal phase: Secondary | ICD-10-CM | POA: Diagnosis not present

## 2019-01-01 DIAGNOSIS — E118 Type 2 diabetes mellitus with unspecified complications: Secondary | ICD-10-CM | POA: Diagnosis not present

## 2019-01-01 DIAGNOSIS — R2689 Other abnormalities of gait and mobility: Secondary | ICD-10-CM | POA: Diagnosis not present

## 2019-01-01 DIAGNOSIS — I10 Essential (primary) hypertension: Secondary | ICD-10-CM | POA: Diagnosis not present

## 2019-01-01 DIAGNOSIS — J449 Chronic obstructive pulmonary disease, unspecified: Secondary | ICD-10-CM | POA: Diagnosis not present

## 2019-01-01 DIAGNOSIS — M6281 Muscle weakness (generalized): Secondary | ICD-10-CM | POA: Diagnosis not present

## 2019-01-01 DIAGNOSIS — I5032 Chronic diastolic (congestive) heart failure: Secondary | ICD-10-CM | POA: Diagnosis not present

## 2019-01-02 DIAGNOSIS — R2689 Other abnormalities of gait and mobility: Secondary | ICD-10-CM | POA: Diagnosis not present

## 2019-01-02 DIAGNOSIS — M48061 Spinal stenosis, lumbar region without neurogenic claudication: Secondary | ICD-10-CM | POA: Diagnosis not present

## 2019-01-02 DIAGNOSIS — M6281 Muscle weakness (generalized): Secondary | ICD-10-CM | POA: Diagnosis not present

## 2019-01-02 DIAGNOSIS — R1312 Dysphagia, oropharyngeal phase: Secondary | ICD-10-CM | POA: Diagnosis not present

## 2019-01-03 DIAGNOSIS — I10 Essential (primary) hypertension: Secondary | ICD-10-CM | POA: Diagnosis not present

## 2019-01-03 DIAGNOSIS — R2689 Other abnormalities of gait and mobility: Secondary | ICD-10-CM | POA: Diagnosis not present

## 2019-01-03 DIAGNOSIS — M48061 Spinal stenosis, lumbar region without neurogenic claudication: Secondary | ICD-10-CM | POA: Diagnosis not present

## 2019-01-03 DIAGNOSIS — D649 Anemia, unspecified: Secondary | ICD-10-CM | POA: Diagnosis not present

## 2019-01-03 DIAGNOSIS — R1312 Dysphagia, oropharyngeal phase: Secondary | ICD-10-CM | POA: Diagnosis not present

## 2019-01-03 DIAGNOSIS — M6281 Muscle weakness (generalized): Secondary | ICD-10-CM | POA: Diagnosis not present

## 2019-01-04 DIAGNOSIS — R2689 Other abnormalities of gait and mobility: Secondary | ICD-10-CM | POA: Diagnosis not present

## 2019-01-04 DIAGNOSIS — R1312 Dysphagia, oropharyngeal phase: Secondary | ICD-10-CM | POA: Diagnosis not present

## 2019-01-04 DIAGNOSIS — M6281 Muscle weakness (generalized): Secondary | ICD-10-CM | POA: Diagnosis not present

## 2019-01-04 DIAGNOSIS — M48061 Spinal stenosis, lumbar region without neurogenic claudication: Secondary | ICD-10-CM | POA: Diagnosis not present

## 2019-01-05 DIAGNOSIS — R2689 Other abnormalities of gait and mobility: Secondary | ICD-10-CM | POA: Diagnosis not present

## 2019-01-05 DIAGNOSIS — R1312 Dysphagia, oropharyngeal phase: Secondary | ICD-10-CM | POA: Diagnosis not present

## 2019-01-05 DIAGNOSIS — M6281 Muscle weakness (generalized): Secondary | ICD-10-CM | POA: Diagnosis not present

## 2019-01-05 DIAGNOSIS — M48061 Spinal stenosis, lumbar region without neurogenic claudication: Secondary | ICD-10-CM | POA: Diagnosis not present

## 2019-01-08 DIAGNOSIS — R2689 Other abnormalities of gait and mobility: Secondary | ICD-10-CM | POA: Diagnosis not present

## 2019-01-08 DIAGNOSIS — M48061 Spinal stenosis, lumbar region without neurogenic claudication: Secondary | ICD-10-CM | POA: Diagnosis not present

## 2019-01-08 DIAGNOSIS — R1312 Dysphagia, oropharyngeal phase: Secondary | ICD-10-CM | POA: Diagnosis not present

## 2019-01-08 DIAGNOSIS — M6281 Muscle weakness (generalized): Secondary | ICD-10-CM | POA: Diagnosis not present

## 2019-01-09 DIAGNOSIS — M6281 Muscle weakness (generalized): Secondary | ICD-10-CM | POA: Diagnosis not present

## 2019-01-09 DIAGNOSIS — R2689 Other abnormalities of gait and mobility: Secondary | ICD-10-CM | POA: Diagnosis not present

## 2019-01-09 DIAGNOSIS — M48061 Spinal stenosis, lumbar region without neurogenic claudication: Secondary | ICD-10-CM | POA: Diagnosis not present

## 2019-01-09 DIAGNOSIS — R1312 Dysphagia, oropharyngeal phase: Secondary | ICD-10-CM | POA: Diagnosis not present

## 2019-01-10 DIAGNOSIS — I5032 Chronic diastolic (congestive) heart failure: Secondary | ICD-10-CM | POA: Diagnosis not present

## 2019-01-10 DIAGNOSIS — R1312 Dysphagia, oropharyngeal phase: Secondary | ICD-10-CM | POA: Diagnosis not present

## 2019-01-10 DIAGNOSIS — I10 Essential (primary) hypertension: Secondary | ICD-10-CM | POA: Diagnosis not present

## 2019-01-10 DIAGNOSIS — M48061 Spinal stenosis, lumbar region without neurogenic claudication: Secondary | ICD-10-CM | POA: Diagnosis not present

## 2019-01-10 DIAGNOSIS — R2689 Other abnormalities of gait and mobility: Secondary | ICD-10-CM | POA: Diagnosis not present

## 2019-01-10 DIAGNOSIS — J449 Chronic obstructive pulmonary disease, unspecified: Secondary | ICD-10-CM | POA: Diagnosis not present

## 2019-01-10 DIAGNOSIS — M6281 Muscle weakness (generalized): Secondary | ICD-10-CM | POA: Diagnosis not present

## 2019-01-10 DIAGNOSIS — E118 Type 2 diabetes mellitus with unspecified complications: Secondary | ICD-10-CM | POA: Diagnosis not present

## 2019-01-11 DIAGNOSIS — M48061 Spinal stenosis, lumbar region without neurogenic claudication: Secondary | ICD-10-CM | POA: Diagnosis not present

## 2019-01-11 DIAGNOSIS — R2689 Other abnormalities of gait and mobility: Secondary | ICD-10-CM | POA: Diagnosis not present

## 2019-01-11 DIAGNOSIS — M6281 Muscle weakness (generalized): Secondary | ICD-10-CM | POA: Diagnosis not present

## 2019-01-11 DIAGNOSIS — R1312 Dysphagia, oropharyngeal phase: Secondary | ICD-10-CM | POA: Diagnosis not present

## 2019-01-12 DIAGNOSIS — M48061 Spinal stenosis, lumbar region without neurogenic claudication: Secondary | ICD-10-CM | POA: Diagnosis not present

## 2019-01-12 DIAGNOSIS — R2689 Other abnormalities of gait and mobility: Secondary | ICD-10-CM | POA: Diagnosis not present

## 2019-01-12 DIAGNOSIS — M6281 Muscle weakness (generalized): Secondary | ICD-10-CM | POA: Diagnosis not present

## 2019-01-12 DIAGNOSIS — R1312 Dysphagia, oropharyngeal phase: Secondary | ICD-10-CM | POA: Diagnosis not present

## 2019-01-15 DIAGNOSIS — M48061 Spinal stenosis, lumbar region without neurogenic claudication: Secondary | ICD-10-CM | POA: Diagnosis not present

## 2019-01-15 DIAGNOSIS — M6281 Muscle weakness (generalized): Secondary | ICD-10-CM | POA: Diagnosis not present

## 2019-01-15 DIAGNOSIS — R2689 Other abnormalities of gait and mobility: Secondary | ICD-10-CM | POA: Diagnosis not present

## 2019-01-15 DIAGNOSIS — R1312 Dysphagia, oropharyngeal phase: Secondary | ICD-10-CM | POA: Diagnosis not present

## 2019-01-16 DIAGNOSIS — M6281 Muscle weakness (generalized): Secondary | ICD-10-CM | POA: Diagnosis not present

## 2019-01-16 DIAGNOSIS — M48061 Spinal stenosis, lumbar region without neurogenic claudication: Secondary | ICD-10-CM | POA: Diagnosis not present

## 2019-01-16 DIAGNOSIS — R2689 Other abnormalities of gait and mobility: Secondary | ICD-10-CM | POA: Diagnosis not present

## 2019-01-16 DIAGNOSIS — R1312 Dysphagia, oropharyngeal phase: Secondary | ICD-10-CM | POA: Diagnosis not present

## 2019-01-17 DIAGNOSIS — M48061 Spinal stenosis, lumbar region without neurogenic claudication: Secondary | ICD-10-CM | POA: Diagnosis not present

## 2019-01-17 DIAGNOSIS — M6281 Muscle weakness (generalized): Secondary | ICD-10-CM | POA: Diagnosis not present

## 2019-01-17 DIAGNOSIS — I10 Essential (primary) hypertension: Secondary | ICD-10-CM | POA: Diagnosis not present

## 2019-01-17 DIAGNOSIS — R1312 Dysphagia, oropharyngeal phase: Secondary | ICD-10-CM | POA: Diagnosis not present

## 2019-01-17 DIAGNOSIS — J449 Chronic obstructive pulmonary disease, unspecified: Secondary | ICD-10-CM | POA: Diagnosis not present

## 2019-01-17 DIAGNOSIS — R2689 Other abnormalities of gait and mobility: Secondary | ICD-10-CM | POA: Diagnosis not present

## 2019-01-17 DIAGNOSIS — I5032 Chronic diastolic (congestive) heart failure: Secondary | ICD-10-CM | POA: Diagnosis not present

## 2019-01-17 DIAGNOSIS — E118 Type 2 diabetes mellitus with unspecified complications: Secondary | ICD-10-CM | POA: Diagnosis not present

## 2019-01-18 DIAGNOSIS — R2689 Other abnormalities of gait and mobility: Secondary | ICD-10-CM | POA: Diagnosis not present

## 2019-01-18 DIAGNOSIS — M48061 Spinal stenosis, lumbar region without neurogenic claudication: Secondary | ICD-10-CM | POA: Diagnosis not present

## 2019-01-18 DIAGNOSIS — M6281 Muscle weakness (generalized): Secondary | ICD-10-CM | POA: Diagnosis not present

## 2019-01-18 DIAGNOSIS — R1312 Dysphagia, oropharyngeal phase: Secondary | ICD-10-CM | POA: Diagnosis not present

## 2019-01-19 DIAGNOSIS — R1312 Dysphagia, oropharyngeal phase: Secondary | ICD-10-CM | POA: Diagnosis not present

## 2019-01-19 DIAGNOSIS — M6281 Muscle weakness (generalized): Secondary | ICD-10-CM | POA: Diagnosis not present

## 2019-01-19 DIAGNOSIS — R2689 Other abnormalities of gait and mobility: Secondary | ICD-10-CM | POA: Diagnosis not present

## 2019-01-19 DIAGNOSIS — M48061 Spinal stenosis, lumbar region without neurogenic claudication: Secondary | ICD-10-CM | POA: Diagnosis not present

## 2019-01-22 DIAGNOSIS — R2689 Other abnormalities of gait and mobility: Secondary | ICD-10-CM | POA: Diagnosis not present

## 2019-01-22 DIAGNOSIS — M48061 Spinal stenosis, lumbar region without neurogenic claudication: Secondary | ICD-10-CM | POA: Diagnosis not present

## 2019-01-22 DIAGNOSIS — M6281 Muscle weakness (generalized): Secondary | ICD-10-CM | POA: Diagnosis not present

## 2019-01-22 DIAGNOSIS — R1312 Dysphagia, oropharyngeal phase: Secondary | ICD-10-CM | POA: Diagnosis not present

## 2019-01-23 DIAGNOSIS — R1312 Dysphagia, oropharyngeal phase: Secondary | ICD-10-CM | POA: Diagnosis not present

## 2019-01-23 DIAGNOSIS — M6281 Muscle weakness (generalized): Secondary | ICD-10-CM | POA: Diagnosis not present

## 2019-01-23 DIAGNOSIS — M48061 Spinal stenosis, lumbar region without neurogenic claudication: Secondary | ICD-10-CM | POA: Diagnosis not present

## 2019-01-23 DIAGNOSIS — Z23 Encounter for immunization: Secondary | ICD-10-CM | POA: Diagnosis not present

## 2019-01-23 DIAGNOSIS — R2689 Other abnormalities of gait and mobility: Secondary | ICD-10-CM | POA: Diagnosis not present

## 2019-01-24 DIAGNOSIS — R2689 Other abnormalities of gait and mobility: Secondary | ICD-10-CM | POA: Diagnosis not present

## 2019-01-24 DIAGNOSIS — M6281 Muscle weakness (generalized): Secondary | ICD-10-CM | POA: Diagnosis not present

## 2019-01-24 DIAGNOSIS — M48061 Spinal stenosis, lumbar region without neurogenic claudication: Secondary | ICD-10-CM | POA: Diagnosis not present

## 2019-01-24 DIAGNOSIS — R1312 Dysphagia, oropharyngeal phase: Secondary | ICD-10-CM | POA: Diagnosis not present

## 2019-01-25 DIAGNOSIS — M6281 Muscle weakness (generalized): Secondary | ICD-10-CM | POA: Diagnosis not present

## 2019-01-25 DIAGNOSIS — R1312 Dysphagia, oropharyngeal phase: Secondary | ICD-10-CM | POA: Diagnosis not present

## 2019-01-25 DIAGNOSIS — R2689 Other abnormalities of gait and mobility: Secondary | ICD-10-CM | POA: Diagnosis not present

## 2019-01-25 DIAGNOSIS — M48061 Spinal stenosis, lumbar region without neurogenic claudication: Secondary | ICD-10-CM | POA: Diagnosis not present

## 2019-01-26 DIAGNOSIS — R2689 Other abnormalities of gait and mobility: Secondary | ICD-10-CM | POA: Diagnosis not present

## 2019-01-26 DIAGNOSIS — R1312 Dysphagia, oropharyngeal phase: Secondary | ICD-10-CM | POA: Diagnosis not present

## 2019-01-26 DIAGNOSIS — M6281 Muscle weakness (generalized): Secondary | ICD-10-CM | POA: Diagnosis not present

## 2019-01-26 DIAGNOSIS — M48061 Spinal stenosis, lumbar region without neurogenic claudication: Secondary | ICD-10-CM | POA: Diagnosis not present

## 2019-01-29 DIAGNOSIS — R2689 Other abnormalities of gait and mobility: Secondary | ICD-10-CM | POA: Diagnosis not present

## 2019-01-29 DIAGNOSIS — R1312 Dysphagia, oropharyngeal phase: Secondary | ICD-10-CM | POA: Diagnosis not present

## 2019-01-29 DIAGNOSIS — M6281 Muscle weakness (generalized): Secondary | ICD-10-CM | POA: Diagnosis not present

## 2019-01-29 DIAGNOSIS — M48061 Spinal stenosis, lumbar region without neurogenic claudication: Secondary | ICD-10-CM | POA: Diagnosis not present

## 2019-01-30 ENCOUNTER — Other Ambulatory Visit: Payer: Self-pay

## 2019-01-30 ENCOUNTER — Encounter: Payer: Medicare Other | Admitting: *Deleted

## 2019-01-30 DIAGNOSIS — R2689 Other abnormalities of gait and mobility: Secondary | ICD-10-CM | POA: Diagnosis not present

## 2019-01-30 DIAGNOSIS — M6281 Muscle weakness (generalized): Secondary | ICD-10-CM | POA: Diagnosis not present

## 2019-01-30 DIAGNOSIS — R1312 Dysphagia, oropharyngeal phase: Secondary | ICD-10-CM | POA: Diagnosis not present

## 2019-01-30 DIAGNOSIS — M48061 Spinal stenosis, lumbar region without neurogenic claudication: Secondary | ICD-10-CM | POA: Diagnosis not present

## 2019-01-31 ENCOUNTER — Telehealth: Payer: Self-pay

## 2019-01-31 DIAGNOSIS — M48061 Spinal stenosis, lumbar region without neurogenic claudication: Secondary | ICD-10-CM | POA: Diagnosis not present

## 2019-01-31 DIAGNOSIS — R2689 Other abnormalities of gait and mobility: Secondary | ICD-10-CM | POA: Diagnosis not present

## 2019-01-31 DIAGNOSIS — M6281 Muscle weakness (generalized): Secondary | ICD-10-CM | POA: Diagnosis not present

## 2019-01-31 DIAGNOSIS — R1312 Dysphagia, oropharyngeal phase: Secondary | ICD-10-CM | POA: Diagnosis not present

## 2019-01-31 NOTE — Telephone Encounter (Signed)
Unable to speak  with patient to remind of missed remote transmission 

## 2019-02-06 ENCOUNTER — Encounter: Payer: Self-pay | Admitting: Cardiology

## 2019-02-12 ENCOUNTER — Telehealth: Payer: Self-pay | Admitting: Internal Medicine

## 2019-02-12 NOTE — Telephone Encounter (Signed)
I called the [facility] and talked to the Facility Directory. I told her I rescheduled the pt appointment for 02/13/2019 and if they need help sending that transmission they could call my direct office number to receive help sending the transmission from the pt home remote monitor.

## 2019-02-12 NOTE — Telephone Encounter (Signed)
He missed his last remote - Barbara/Kem - can you call the [facility] and help them transmit?  Thanks!

## 2019-02-12 NOTE — Telephone Encounter (Signed)
Needing to know when next remote device check is  Recently admitted to [facility] center

## 2019-02-13 ENCOUNTER — Ambulatory Visit (INDEPENDENT_AMBULATORY_CARE_PROVIDER_SITE_OTHER): Payer: Medicare Other | Admitting: *Deleted

## 2019-02-13 ENCOUNTER — Other Ambulatory Visit: Payer: Self-pay

## 2019-02-13 DIAGNOSIS — I495 Sick sinus syndrome: Secondary | ICD-10-CM | POA: Diagnosis not present

## 2019-02-13 DIAGNOSIS — I5032 Chronic diastolic (congestive) heart failure: Secondary | ICD-10-CM

## 2019-02-14 DIAGNOSIS — F329 Major depressive disorder, single episode, unspecified: Secondary | ICD-10-CM | POA: Diagnosis not present

## 2019-02-14 DIAGNOSIS — L853 Xerosis cutis: Secondary | ICD-10-CM | POA: Diagnosis not present

## 2019-02-14 DIAGNOSIS — J449 Chronic obstructive pulmonary disease, unspecified: Secondary | ICD-10-CM | POA: Diagnosis not present

## 2019-02-14 DIAGNOSIS — I10 Essential (primary) hypertension: Secondary | ICD-10-CM | POA: Diagnosis not present

## 2019-02-14 LAB — CUP PACEART REMOTE DEVICE CHECK
Date Time Interrogation Session: 20200429082543
Implantable Lead Implant Date: 19960418
Implantable Lead Implant Date: 19960418
Implantable Lead Location: 753859
Implantable Lead Location: 753860
Implantable Lead Model: 5034
Implantable Lead Model: 5534
Implantable Pulse Generator Implant Date: 20081211

## 2019-02-14 NOTE — Telephone Encounter (Signed)
Transmission received.  Alan Balsam, NP 02/14/2019 7:36 AM

## 2019-02-21 DIAGNOSIS — I5032 Chronic diastolic (congestive) heart failure: Secondary | ICD-10-CM | POA: Diagnosis not present

## 2019-02-21 DIAGNOSIS — I10 Essential (primary) hypertension: Secondary | ICD-10-CM | POA: Diagnosis not present

## 2019-02-21 DIAGNOSIS — F329 Major depressive disorder, single episode, unspecified: Secondary | ICD-10-CM | POA: Diagnosis not present

## 2019-02-21 DIAGNOSIS — E118 Type 2 diabetes mellitus with unspecified complications: Secondary | ICD-10-CM | POA: Diagnosis not present

## 2019-02-21 NOTE — Progress Notes (Signed)
Remote pacemaker transmission.   

## 2019-02-26 DIAGNOSIS — R300 Dysuria: Secondary | ICD-10-CM | POA: Diagnosis not present

## 2019-02-27 DIAGNOSIS — N390 Urinary tract infection, site not specified: Secondary | ICD-10-CM | POA: Diagnosis not present

## 2019-02-27 DIAGNOSIS — Z79899 Other long term (current) drug therapy: Secondary | ICD-10-CM | POA: Diagnosis not present

## 2019-02-27 DIAGNOSIS — R8290 Unspecified abnormal findings in urine: Secondary | ICD-10-CM | POA: Diagnosis not present

## 2019-02-27 DIAGNOSIS — R319 Hematuria, unspecified: Secondary | ICD-10-CM | POA: Diagnosis not present

## 2019-03-12 DIAGNOSIS — S61411A Laceration without foreign body of right hand, initial encounter: Secondary | ICD-10-CM | POA: Diagnosis not present

## 2019-03-12 DIAGNOSIS — Z950 Presence of cardiac pacemaker: Secondary | ICD-10-CM | POA: Diagnosis not present

## 2019-03-12 DIAGNOSIS — M47816 Spondylosis without myelopathy or radiculopathy, lumbar region: Secondary | ICD-10-CM | POA: Diagnosis not present

## 2019-03-12 DIAGNOSIS — Z7982 Long term (current) use of aspirin: Secondary | ICD-10-CM | POA: Diagnosis not present

## 2019-03-12 DIAGNOSIS — S0101XA Laceration without foreign body of scalp, initial encounter: Secondary | ICD-10-CM | POA: Diagnosis not present

## 2019-03-12 DIAGNOSIS — S59902A Unspecified injury of left elbow, initial encounter: Secondary | ICD-10-CM | POA: Diagnosis not present

## 2019-03-12 DIAGNOSIS — J90 Pleural effusion, not elsewhere classified: Secondary | ICD-10-CM | POA: Diagnosis not present

## 2019-03-12 DIAGNOSIS — R001 Bradycardia, unspecified: Secondary | ICD-10-CM | POA: Diagnosis not present

## 2019-03-12 DIAGNOSIS — S0181XA Laceration without foreign body of other part of head, initial encounter: Secondary | ICD-10-CM | POA: Diagnosis not present

## 2019-03-12 DIAGNOSIS — S59901A Unspecified injury of right elbow, initial encounter: Secondary | ICD-10-CM | POA: Diagnosis not present

## 2019-03-12 DIAGNOSIS — Z794 Long term (current) use of insulin: Secondary | ICD-10-CM | POA: Diagnosis not present

## 2019-03-12 DIAGNOSIS — M79603 Pain in arm, unspecified: Secondary | ICD-10-CM | POA: Diagnosis not present

## 2019-03-12 DIAGNOSIS — J449 Chronic obstructive pulmonary disease, unspecified: Secondary | ICD-10-CM | POA: Diagnosis not present

## 2019-03-12 DIAGNOSIS — S0993XA Unspecified injury of face, initial encounter: Secondary | ICD-10-CM | POA: Diagnosis not present

## 2019-03-12 DIAGNOSIS — Z87891 Personal history of nicotine dependence: Secondary | ICD-10-CM | POA: Diagnosis not present

## 2019-03-12 DIAGNOSIS — R52 Pain, unspecified: Secondary | ICD-10-CM | POA: Diagnosis not present

## 2019-03-12 DIAGNOSIS — R000 Tachycardia, unspecified: Secondary | ICD-10-CM | POA: Diagnosis not present

## 2019-03-12 DIAGNOSIS — R0689 Other abnormalities of breathing: Secondary | ICD-10-CM | POA: Diagnosis not present

## 2019-03-12 DIAGNOSIS — Z79899 Other long term (current) drug therapy: Secondary | ICD-10-CM | POA: Diagnosis not present

## 2019-03-12 DIAGNOSIS — I509 Heart failure, unspecified: Secondary | ICD-10-CM | POA: Diagnosis not present

## 2019-03-12 DIAGNOSIS — M48061 Spinal stenosis, lumbar region without neurogenic claudication: Secondary | ICD-10-CM | POA: Diagnosis not present

## 2019-03-12 DIAGNOSIS — S59912A Unspecified injury of left forearm, initial encounter: Secondary | ICD-10-CM | POA: Diagnosis not present

## 2019-03-12 DIAGNOSIS — S199XXA Unspecified injury of neck, initial encounter: Secondary | ICD-10-CM | POA: Diagnosis not present

## 2019-03-12 DIAGNOSIS — S4992XA Unspecified injury of left shoulder and upper arm, initial encounter: Secondary | ICD-10-CM | POA: Diagnosis not present

## 2019-03-12 DIAGNOSIS — S6991XA Unspecified injury of right wrist, hand and finger(s), initial encounter: Secondary | ICD-10-CM | POA: Diagnosis not present

## 2019-03-12 DIAGNOSIS — W010XXA Fall on same level from slipping, tripping and stumbling without subsequent striking against object, initial encounter: Secondary | ICD-10-CM | POA: Diagnosis not present

## 2019-03-12 DIAGNOSIS — S59911A Unspecified injury of right forearm, initial encounter: Secondary | ICD-10-CM | POA: Diagnosis not present

## 2019-03-12 DIAGNOSIS — S0003XA Contusion of scalp, initial encounter: Secondary | ICD-10-CM | POA: Diagnosis not present

## 2019-03-12 DIAGNOSIS — S3993XA Unspecified injury of pelvis, initial encounter: Secondary | ICD-10-CM | POA: Diagnosis not present

## 2019-03-12 DIAGNOSIS — S4991XA Unspecified injury of right shoulder and upper arm, initial encounter: Secondary | ICD-10-CM | POA: Diagnosis not present

## 2019-03-12 DIAGNOSIS — S3991XA Unspecified injury of abdomen, initial encounter: Secondary | ICD-10-CM | POA: Diagnosis not present

## 2019-03-12 DIAGNOSIS — S299XXA Unspecified injury of thorax, initial encounter: Secondary | ICD-10-CM | POA: Diagnosis not present

## 2019-03-12 DIAGNOSIS — S060X0A Concussion without loss of consciousness, initial encounter: Secondary | ICD-10-CM | POA: Diagnosis not present

## 2019-03-12 DIAGNOSIS — I482 Chronic atrial fibrillation, unspecified: Secondary | ICD-10-CM | POA: Diagnosis not present

## 2019-03-12 DIAGNOSIS — S51011A Laceration without foreign body of right elbow, initial encounter: Secondary | ICD-10-CM | POA: Diagnosis not present

## 2019-03-13 DIAGNOSIS — R5381 Other malaise: Secondary | ICD-10-CM | POA: Diagnosis not present

## 2019-03-13 DIAGNOSIS — M48061 Spinal stenosis, lumbar region without neurogenic claudication: Secondary | ICD-10-CM | POA: Diagnosis not present

## 2019-03-13 DIAGNOSIS — R2689 Other abnormalities of gait and mobility: Secondary | ICD-10-CM | POA: Diagnosis not present

## 2019-03-13 DIAGNOSIS — S0990XA Unspecified injury of head, initial encounter: Secondary | ICD-10-CM | POA: Diagnosis not present

## 2019-03-13 DIAGNOSIS — Z743 Need for continuous supervision: Secondary | ICD-10-CM | POA: Diagnosis not present

## 2019-03-13 DIAGNOSIS — R1312 Dysphagia, oropharyngeal phase: Secondary | ICD-10-CM | POA: Diagnosis not present

## 2019-03-13 DIAGNOSIS — M6281 Muscle weakness (generalized): Secondary | ICD-10-CM | POA: Diagnosis not present

## 2019-03-14 DIAGNOSIS — R2689 Other abnormalities of gait and mobility: Secondary | ICD-10-CM | POA: Diagnosis not present

## 2019-03-14 DIAGNOSIS — R1312 Dysphagia, oropharyngeal phase: Secondary | ICD-10-CM | POA: Diagnosis not present

## 2019-03-14 DIAGNOSIS — M48061 Spinal stenosis, lumbar region without neurogenic claudication: Secondary | ICD-10-CM | POA: Diagnosis not present

## 2019-03-14 DIAGNOSIS — M6281 Muscle weakness (generalized): Secondary | ICD-10-CM | POA: Diagnosis not present

## 2019-03-15 DIAGNOSIS — R2689 Other abnormalities of gait and mobility: Secondary | ICD-10-CM | POA: Diagnosis not present

## 2019-03-15 DIAGNOSIS — R1312 Dysphagia, oropharyngeal phase: Secondary | ICD-10-CM | POA: Diagnosis not present

## 2019-03-15 DIAGNOSIS — M6281 Muscle weakness (generalized): Secondary | ICD-10-CM | POA: Diagnosis not present

## 2019-03-15 DIAGNOSIS — M48061 Spinal stenosis, lumbar region without neurogenic claudication: Secondary | ICD-10-CM | POA: Diagnosis not present

## 2019-03-16 DIAGNOSIS — M6281 Muscle weakness (generalized): Secondary | ICD-10-CM | POA: Diagnosis not present

## 2019-03-16 DIAGNOSIS — R2689 Other abnormalities of gait and mobility: Secondary | ICD-10-CM | POA: Diagnosis not present

## 2019-03-16 DIAGNOSIS — R1312 Dysphagia, oropharyngeal phase: Secondary | ICD-10-CM | POA: Diagnosis not present

## 2019-03-16 DIAGNOSIS — M48061 Spinal stenosis, lumbar region without neurogenic claudication: Secondary | ICD-10-CM | POA: Diagnosis not present

## 2019-03-19 DIAGNOSIS — R1312 Dysphagia, oropharyngeal phase: Secondary | ICD-10-CM | POA: Diagnosis not present

## 2019-03-19 DIAGNOSIS — R2689 Other abnormalities of gait and mobility: Secondary | ICD-10-CM | POA: Diagnosis not present

## 2019-03-19 DIAGNOSIS — M6281 Muscle weakness (generalized): Secondary | ICD-10-CM | POA: Diagnosis not present

## 2019-03-19 DIAGNOSIS — M48061 Spinal stenosis, lumbar region without neurogenic claudication: Secondary | ICD-10-CM | POA: Diagnosis not present

## 2019-03-21 DIAGNOSIS — S0181XD Laceration without foreign body of other part of head, subsequent encounter: Secondary | ICD-10-CM | POA: Diagnosis not present

## 2019-03-27 ENCOUNTER — Telehealth (INDEPENDENT_AMBULATORY_CARE_PROVIDER_SITE_OTHER): Payer: Medicare Other | Admitting: Cardiology

## 2019-03-27 ENCOUNTER — Encounter: Payer: Self-pay | Admitting: Cardiology

## 2019-03-27 DIAGNOSIS — I5032 Chronic diastolic (congestive) heart failure: Secondary | ICD-10-CM

## 2019-03-27 DIAGNOSIS — I4892 Unspecified atrial flutter: Secondary | ICD-10-CM

## 2019-03-27 DIAGNOSIS — S0181XA Laceration without foreign body of other part of head, initial encounter: Secondary | ICD-10-CM | POA: Diagnosis not present

## 2019-03-27 DIAGNOSIS — I10 Essential (primary) hypertension: Secondary | ICD-10-CM

## 2019-03-27 NOTE — Patient Instructions (Signed)
Your physician recommends that you schedule a follow-up appointment in: 3 MONTHS WITH DR BRANCH  Your physician recommends that you continue on your current medications as directed. Please refer to the Current Medication list given to you today.  Thank you for choosing [facility]!!    

## 2019-03-27 NOTE — Progress Notes (Signed)
Virtual Visit via Telephone Note   This visit type was conducted due to national recommendations for restrictions regarding the COVID-19 Pandemic (e.g. social distancing) in an effort to limit this patient's exposure and mitigate transmission in our community.  Due to his co-morbid illnesses, this patient is at least at moderate risk for complications without adequate follow up.  This format is felt to be most appropriate for this patient at this time.  The patient did not have access to video technology/had technical difficulties with video requiring transitioning to audio format only (telephone).  All issues noted in this document were discussed and addressed.  No physical exam could be performed with this format.  Please refer to the patient's chart for his  consent to telehealth for [facility].   Date:  03/27/2019   ID:  Alan Briggs, DOB Nov 05, 1921, MRN [id number]  Patient Location: Home Provider Location: Office  PCP:  Celene Squibb, MD  Cardiologist:  Carlyle Dolly, MD  Electrophysiologist:  Cristopher Peru, MD   Evaluation Performed:  Follow-Up Visit  Chief Complaint:  Leg swelling  History of Present Illness:    Alan Briggs is a 85 y.o. male seen today for follow up of the following medical problems.     1.Chronic diastolic HF - torsemide changed to lasix at [facility] - labs 01/03/19 Cr 0.73 BUN 10.6  2.Sick sinus syndrome - history of pacemaker placement in 1996. Replaced in 2008 - previously followed at [facility]. From there notes he has a MDT Adapta DR device placed in 2008.    - normal device check 01/2019 -no recent symptoms  3. HTN -he is compliant with meds  - bp's 122/55 HR 64   4. OSA - history of OSA, has not wanted to wear CPAP  6. Aflutter - noted onpriordevice checks - frequent falls, has not been started on anticoag.Also history of GI bleed  -denies any symptoms  7.History ofGI bleed - admit 04/2017 with GI bleed  The  patient does not have symptoms concerning for COVID-19 infection (fever, chills, cough, or new shortness of breath).    Past Medical History:  Diagnosis Date  . Cardiac pacemaker in situ    for sick sinus syndrome-last placement was 09/2007 [facility]  . Diabetes [facility])   . GERD (gastroesophageal reflux disease)   . Mixed hyperlipidemia   . Obstructive lung disease ([place])    non compliant with home O2  . Pulmonary hypertension ([place])   . Sleep apnea   . Thrombocytopenia ([place])   . Venous insufficiency (chronic) (peripheral)    Past Surgical History:  Procedure Laterality Date  . CATARACT EXTRACTION, BILATERAL Bilateral   . COLONOSCOPY N/A 04/24/2017   Procedure: COLONOSCOPY;  Surgeon: Daneil Dolin, MD;  [facility];  Service: Endoscopy;  Laterality: N/A;  . ESOPHAGOGASTRODUODENOSCOPY N/A 04/24/2017   Procedure: ESOPHAGOGASTRODUODENOSCOPY (EGD);  Surgeon: Daneil Dolin, MD;  [facility];  Service: Endoscopy;  Laterality: N/A;  . KIDNEY STONE SURGERY Left    laser ablation   . PACEMAKER IMPLANT    . PERCUTANEOUS PINNING Right 04/20/2016   Procedure: IRRIGATION AND DEBRIDEMENT RIGHT FOOT, PERCUTANEOUS PINNING SMALL TOE;  Surgeon: Leandrew Koyanagi, MD;  Location: [place];  Service: Orthopedics;  Laterality: Right;  . REFRACTIVE SURGERY Right   . ROTATOR CUFF REPAIR Left      No outpatient medications have been marked as taking for the 03/27/19 encounter (Appointment) with Arnoldo Lenis, MD.  Allergies:   Advair diskus [fluticasone-salmeterol]; Combivent [ipratropium-albuterol]; Lotensin hct [benazepril-hydrochlorothiazide]; Penicillins; and Simvastatin   Social History   Tobacco Use  . Smoking status: Former Smoker    Packs/day: 0.50    Years: 30.00    Pack years: 15.00    Types: Cigarettes    Start date: 10/18/1948    Last attempt to quit: 10/18/1978    Years since quitting: 40.4  . Smokeless tobacco: Never Used  Substance Use Topics  . Alcohol  use: No    Alcohol/week: 0.0 standard drinks  . Drug use: No     Family Hx: The patient's family history includes Diabetes in his mother. There is no history of Colon cancer or Colon polyps.  ROS:   Please see the history of present illness.     All other systems reviewed and are negative.   Prior CV studies:   The following studies were reviewed today:   Labs/Other Tests and Data Reviewed:    EKG:  No ECG reviewed.  Recent Labs: No results found for requested labs within last 8760 hours.   Recent Lipid Panel No results found for: CHOL, TRIG, HDL, CHOLHDL, LDLCALC, LDLDIRECT  Wt Readings from Last 3 Encounters:  11/21/18 267 lb 3.2 oz (121.2 kg)  09/28/18 265 lb (120.2 kg)  09/19/18 266 lb (120.7 kg)     Objective:    Vital Signs:  There were no vitals filed for this visit. There is no height or weight on file to calculate BMI.   Normal affect. Normal speech pattern and tone. Comfortable, no apparent distress. No audible signs of SOB or wheezing.   ASSESSMENT & PLAN:    1. Chronic diastolic HF - stable, continue current diuretics   2. HTN - at goal, continue current meds  3. Aflutter - not on anticoag due to history of frequent falls as well as recent GI bleed - no recent symptoms, continue current meds     COVID-19 Education: The signs and symptoms of COVID-19 were discussed with the patient and how to seek care for testing (follow up with PCP or arrange E-visit).  The importance of social distancing was discussed today.  Time:   Today, I have spent 13 minutes with the patient with telehealth technology discussing the above problems.     Medication Adjustments/Labs and Tests Ordered: Current medicines are reviewed at length with the patient today.  Concerns regarding medicines are outlined above.   Tests Ordered: No orders of the defined types were placed in this encounter.   Medication Changes: No orders of the defined types were placed  in this encounter.   Disposition:  Follow up in 3 month(s)  Signed, Dina RichBranch, Kaylanni Ezelle, MD  03/27/2019 12:44 PM    [facility]

## 2019-04-03 DIAGNOSIS — M48061 Spinal stenosis, lumbar region without neurogenic claudication: Secondary | ICD-10-CM | POA: Diagnosis not present

## 2019-04-03 DIAGNOSIS — M6281 Muscle weakness (generalized): Secondary | ICD-10-CM | POA: Diagnosis not present

## 2019-04-03 DIAGNOSIS — R1312 Dysphagia, oropharyngeal phase: Secondary | ICD-10-CM | POA: Diagnosis not present

## 2019-04-03 DIAGNOSIS — J449 Chronic obstructive pulmonary disease, unspecified: Secondary | ICD-10-CM | POA: Diagnosis not present

## 2019-04-03 DIAGNOSIS — E118 Type 2 diabetes mellitus with unspecified complications: Secondary | ICD-10-CM | POA: Diagnosis not present

## 2019-04-03 DIAGNOSIS — R2689 Other abnormalities of gait and mobility: Secondary | ICD-10-CM | POA: Diagnosis not present

## 2019-04-04 DIAGNOSIS — M48061 Spinal stenosis, lumbar region without neurogenic claudication: Secondary | ICD-10-CM | POA: Diagnosis not present

## 2019-04-04 DIAGNOSIS — M6281 Muscle weakness (generalized): Secondary | ICD-10-CM | POA: Diagnosis not present

## 2019-04-04 DIAGNOSIS — R2689 Other abnormalities of gait and mobility: Secondary | ICD-10-CM | POA: Diagnosis not present

## 2019-04-04 DIAGNOSIS — R1312 Dysphagia, oropharyngeal phase: Secondary | ICD-10-CM | POA: Diagnosis not present

## 2019-04-05 DIAGNOSIS — M48061 Spinal stenosis, lumbar region without neurogenic claudication: Secondary | ICD-10-CM | POA: Diagnosis not present

## 2019-04-05 DIAGNOSIS — R2689 Other abnormalities of gait and mobility: Secondary | ICD-10-CM | POA: Diagnosis not present

## 2019-04-05 DIAGNOSIS — R1312 Dysphagia, oropharyngeal phase: Secondary | ICD-10-CM | POA: Diagnosis not present

## 2019-04-05 DIAGNOSIS — M6281 Muscle weakness (generalized): Secondary | ICD-10-CM | POA: Diagnosis not present

## 2019-04-06 DIAGNOSIS — R1312 Dysphagia, oropharyngeal phase: Secondary | ICD-10-CM | POA: Diagnosis not present

## 2019-04-06 DIAGNOSIS — R2689 Other abnormalities of gait and mobility: Secondary | ICD-10-CM | POA: Diagnosis not present

## 2019-04-06 DIAGNOSIS — M48061 Spinal stenosis, lumbar region without neurogenic claudication: Secondary | ICD-10-CM | POA: Diagnosis not present

## 2019-04-06 DIAGNOSIS — M6281 Muscle weakness (generalized): Secondary | ICD-10-CM | POA: Diagnosis not present

## 2019-04-09 DIAGNOSIS — R2689 Other abnormalities of gait and mobility: Secondary | ICD-10-CM | POA: Diagnosis not present

## 2019-04-09 DIAGNOSIS — M6281 Muscle weakness (generalized): Secondary | ICD-10-CM | POA: Diagnosis not present

## 2019-04-09 DIAGNOSIS — R1312 Dysphagia, oropharyngeal phase: Secondary | ICD-10-CM | POA: Diagnosis not present

## 2019-04-09 DIAGNOSIS — M48061 Spinal stenosis, lumbar region without neurogenic claudication: Secondary | ICD-10-CM | POA: Diagnosis not present

## 2019-04-10 DIAGNOSIS — M6281 Muscle weakness (generalized): Secondary | ICD-10-CM | POA: Diagnosis not present

## 2019-04-10 DIAGNOSIS — M48061 Spinal stenosis, lumbar region without neurogenic claudication: Secondary | ICD-10-CM | POA: Diagnosis not present

## 2019-04-10 DIAGNOSIS — R1312 Dysphagia, oropharyngeal phase: Secondary | ICD-10-CM | POA: Diagnosis not present

## 2019-04-10 DIAGNOSIS — R2689 Other abnormalities of gait and mobility: Secondary | ICD-10-CM | POA: Diagnosis not present

## 2019-04-11 DIAGNOSIS — M48061 Spinal stenosis, lumbar region without neurogenic claudication: Secondary | ICD-10-CM | POA: Diagnosis not present

## 2019-04-11 DIAGNOSIS — R1312 Dysphagia, oropharyngeal phase: Secondary | ICD-10-CM | POA: Diagnosis not present

## 2019-04-11 DIAGNOSIS — R2689 Other abnormalities of gait and mobility: Secondary | ICD-10-CM | POA: Diagnosis not present

## 2019-04-11 DIAGNOSIS — M6281 Muscle weakness (generalized): Secondary | ICD-10-CM | POA: Diagnosis not present

## 2019-04-12 DIAGNOSIS — R1312 Dysphagia, oropharyngeal phase: Secondary | ICD-10-CM | POA: Diagnosis not present

## 2019-04-12 DIAGNOSIS — M6281 Muscle weakness (generalized): Secondary | ICD-10-CM | POA: Diagnosis not present

## 2019-04-12 DIAGNOSIS — M48061 Spinal stenosis, lumbar region without neurogenic claudication: Secondary | ICD-10-CM | POA: Diagnosis not present

## 2019-04-12 DIAGNOSIS — R2689 Other abnormalities of gait and mobility: Secondary | ICD-10-CM | POA: Diagnosis not present

## 2019-04-13 DIAGNOSIS — M48061 Spinal stenosis, lumbar region without neurogenic claudication: Secondary | ICD-10-CM | POA: Diagnosis not present

## 2019-04-13 DIAGNOSIS — R2689 Other abnormalities of gait and mobility: Secondary | ICD-10-CM | POA: Diagnosis not present

## 2019-04-13 DIAGNOSIS — R1312 Dysphagia, oropharyngeal phase: Secondary | ICD-10-CM | POA: Diagnosis not present

## 2019-04-13 DIAGNOSIS — M6281 Muscle weakness (generalized): Secondary | ICD-10-CM | POA: Diagnosis not present

## 2019-04-16 DIAGNOSIS — R1312 Dysphagia, oropharyngeal phase: Secondary | ICD-10-CM | POA: Diagnosis not present

## 2019-04-16 DIAGNOSIS — M6281 Muscle weakness (generalized): Secondary | ICD-10-CM | POA: Diagnosis not present

## 2019-04-16 DIAGNOSIS — M48061 Spinal stenosis, lumbar region without neurogenic claudication: Secondary | ICD-10-CM | POA: Diagnosis not present

## 2019-04-16 DIAGNOSIS — R2689 Other abnormalities of gait and mobility: Secondary | ICD-10-CM | POA: Diagnosis not present

## 2019-04-17 DIAGNOSIS — R1312 Dysphagia, oropharyngeal phase: Secondary | ICD-10-CM | POA: Diagnosis not present

## 2019-04-17 DIAGNOSIS — M48061 Spinal stenosis, lumbar region without neurogenic claudication: Secondary | ICD-10-CM | POA: Diagnosis not present

## 2019-04-17 DIAGNOSIS — R2689 Other abnormalities of gait and mobility: Secondary | ICD-10-CM | POA: Diagnosis not present

## 2019-04-17 DIAGNOSIS — M6281 Muscle weakness (generalized): Secondary | ICD-10-CM | POA: Diagnosis not present

## 2019-04-18 DIAGNOSIS — M6281 Muscle weakness (generalized): Secondary | ICD-10-CM | POA: Diagnosis not present

## 2019-04-18 DIAGNOSIS — M48061 Spinal stenosis, lumbar region without neurogenic claudication: Secondary | ICD-10-CM | POA: Diagnosis not present

## 2019-04-18 DIAGNOSIS — R2689 Other abnormalities of gait and mobility: Secondary | ICD-10-CM | POA: Diagnosis not present

## 2019-04-18 DIAGNOSIS — R1312 Dysphagia, oropharyngeal phase: Secondary | ICD-10-CM | POA: Diagnosis not present

## 2019-04-19 DIAGNOSIS — R1312 Dysphagia, oropharyngeal phase: Secondary | ICD-10-CM | POA: Diagnosis not present

## 2019-04-19 DIAGNOSIS — M6281 Muscle weakness (generalized): Secondary | ICD-10-CM | POA: Diagnosis not present

## 2019-04-19 DIAGNOSIS — M48061 Spinal stenosis, lumbar region without neurogenic claudication: Secondary | ICD-10-CM | POA: Diagnosis not present

## 2019-04-19 DIAGNOSIS — R2689 Other abnormalities of gait and mobility: Secondary | ICD-10-CM | POA: Diagnosis not present

## 2019-04-20 DIAGNOSIS — R1312 Dysphagia, oropharyngeal phase: Secondary | ICD-10-CM | POA: Diagnosis not present

## 2019-04-20 DIAGNOSIS — M6281 Muscle weakness (generalized): Secondary | ICD-10-CM | POA: Diagnosis not present

## 2019-04-20 DIAGNOSIS — M48061 Spinal stenosis, lumbar region without neurogenic claudication: Secondary | ICD-10-CM | POA: Diagnosis not present

## 2019-04-20 DIAGNOSIS — R2689 Other abnormalities of gait and mobility: Secondary | ICD-10-CM | POA: Diagnosis not present

## 2019-04-23 DIAGNOSIS — M6281 Muscle weakness (generalized): Secondary | ICD-10-CM | POA: Diagnosis not present

## 2019-04-23 DIAGNOSIS — R2689 Other abnormalities of gait and mobility: Secondary | ICD-10-CM | POA: Diagnosis not present

## 2019-04-23 DIAGNOSIS — R1312 Dysphagia, oropharyngeal phase: Secondary | ICD-10-CM | POA: Diagnosis not present

## 2019-04-23 DIAGNOSIS — M48061 Spinal stenosis, lumbar region without neurogenic claudication: Secondary | ICD-10-CM | POA: Diagnosis not present

## 2019-04-24 DIAGNOSIS — R1312 Dysphagia, oropharyngeal phase: Secondary | ICD-10-CM | POA: Diagnosis not present

## 2019-04-24 DIAGNOSIS — R2689 Other abnormalities of gait and mobility: Secondary | ICD-10-CM | POA: Diagnosis not present

## 2019-04-24 DIAGNOSIS — M48061 Spinal stenosis, lumbar region without neurogenic claudication: Secondary | ICD-10-CM | POA: Diagnosis not present

## 2019-04-24 DIAGNOSIS — M6281 Muscle weakness (generalized): Secondary | ICD-10-CM | POA: Diagnosis not present

## 2019-04-25 DIAGNOSIS — M6281 Muscle weakness (generalized): Secondary | ICD-10-CM | POA: Diagnosis not present

## 2019-04-25 DIAGNOSIS — M48061 Spinal stenosis, lumbar region without neurogenic claudication: Secondary | ICD-10-CM | POA: Diagnosis not present

## 2019-04-25 DIAGNOSIS — R2689 Other abnormalities of gait and mobility: Secondary | ICD-10-CM | POA: Diagnosis not present

## 2019-04-25 DIAGNOSIS — R1312 Dysphagia, oropharyngeal phase: Secondary | ICD-10-CM | POA: Diagnosis not present

## 2019-04-26 DIAGNOSIS — R2689 Other abnormalities of gait and mobility: Secondary | ICD-10-CM | POA: Diagnosis not present

## 2019-04-26 DIAGNOSIS — R1312 Dysphagia, oropharyngeal phase: Secondary | ICD-10-CM | POA: Diagnosis not present

## 2019-04-26 DIAGNOSIS — M6281 Muscle weakness (generalized): Secondary | ICD-10-CM | POA: Diagnosis not present

## 2019-04-26 DIAGNOSIS — M48061 Spinal stenosis, lumbar region without neurogenic claudication: Secondary | ICD-10-CM | POA: Diagnosis not present

## 2019-04-27 DIAGNOSIS — R2689 Other abnormalities of gait and mobility: Secondary | ICD-10-CM | POA: Diagnosis not present

## 2019-04-27 DIAGNOSIS — M48061 Spinal stenosis, lumbar region without neurogenic claudication: Secondary | ICD-10-CM | POA: Diagnosis not present

## 2019-04-27 DIAGNOSIS — R1312 Dysphagia, oropharyngeal phase: Secondary | ICD-10-CM | POA: Diagnosis not present

## 2019-04-27 DIAGNOSIS — M6281 Muscle weakness (generalized): Secondary | ICD-10-CM | POA: Diagnosis not present

## 2019-04-30 DIAGNOSIS — R1312 Dysphagia, oropharyngeal phase: Secondary | ICD-10-CM | POA: Diagnosis not present

## 2019-04-30 DIAGNOSIS — M48061 Spinal stenosis, lumbar region without neurogenic claudication: Secondary | ICD-10-CM | POA: Diagnosis not present

## 2019-04-30 DIAGNOSIS — M6281 Muscle weakness (generalized): Secondary | ICD-10-CM | POA: Diagnosis not present

## 2019-04-30 DIAGNOSIS — R2689 Other abnormalities of gait and mobility: Secondary | ICD-10-CM | POA: Diagnosis not present

## 2019-05-01 DIAGNOSIS — M6281 Muscle weakness (generalized): Secondary | ICD-10-CM | POA: Diagnosis not present

## 2019-05-01 DIAGNOSIS — M48061 Spinal stenosis, lumbar region without neurogenic claudication: Secondary | ICD-10-CM | POA: Diagnosis not present

## 2019-05-01 DIAGNOSIS — R1312 Dysphagia, oropharyngeal phase: Secondary | ICD-10-CM | POA: Diagnosis not present

## 2019-05-01 DIAGNOSIS — R2689 Other abnormalities of gait and mobility: Secondary | ICD-10-CM | POA: Diagnosis not present

## 2019-05-02 DIAGNOSIS — M6281 Muscle weakness (generalized): Secondary | ICD-10-CM | POA: Diagnosis not present

## 2019-05-02 DIAGNOSIS — M48061 Spinal stenosis, lumbar region without neurogenic claudication: Secondary | ICD-10-CM | POA: Diagnosis not present

## 2019-05-02 DIAGNOSIS — R1312 Dysphagia, oropharyngeal phase: Secondary | ICD-10-CM | POA: Diagnosis not present

## 2019-05-02 DIAGNOSIS — R2689 Other abnormalities of gait and mobility: Secondary | ICD-10-CM | POA: Diagnosis not present

## 2019-05-03 DIAGNOSIS — M6281 Muscle weakness (generalized): Secondary | ICD-10-CM | POA: Diagnosis not present

## 2019-05-03 DIAGNOSIS — R2689 Other abnormalities of gait and mobility: Secondary | ICD-10-CM | POA: Diagnosis not present

## 2019-05-03 DIAGNOSIS — M48061 Spinal stenosis, lumbar region without neurogenic claudication: Secondary | ICD-10-CM | POA: Diagnosis not present

## 2019-05-03 DIAGNOSIS — R1312 Dysphagia, oropharyngeal phase: Secondary | ICD-10-CM | POA: Diagnosis not present

## 2019-05-04 DIAGNOSIS — M6281 Muscle weakness (generalized): Secondary | ICD-10-CM | POA: Diagnosis not present

## 2019-05-04 DIAGNOSIS — R2689 Other abnormalities of gait and mobility: Secondary | ICD-10-CM | POA: Diagnosis not present

## 2019-05-04 DIAGNOSIS — R1312 Dysphagia, oropharyngeal phase: Secondary | ICD-10-CM | POA: Diagnosis not present

## 2019-05-04 DIAGNOSIS — M48061 Spinal stenosis, lumbar region without neurogenic claudication: Secondary | ICD-10-CM | POA: Diagnosis not present

## 2019-05-07 DIAGNOSIS — M6281 Muscle weakness (generalized): Secondary | ICD-10-CM | POA: Diagnosis not present

## 2019-05-07 DIAGNOSIS — R2689 Other abnormalities of gait and mobility: Secondary | ICD-10-CM | POA: Diagnosis not present

## 2019-05-07 DIAGNOSIS — M48061 Spinal stenosis, lumbar region without neurogenic claudication: Secondary | ICD-10-CM | POA: Diagnosis not present

## 2019-05-07 DIAGNOSIS — R1312 Dysphagia, oropharyngeal phase: Secondary | ICD-10-CM | POA: Diagnosis not present

## 2019-05-08 DIAGNOSIS — I5032 Chronic diastolic (congestive) heart failure: Secondary | ICD-10-CM | POA: Diagnosis not present

## 2019-05-08 DIAGNOSIS — R1312 Dysphagia, oropharyngeal phase: Secondary | ICD-10-CM | POA: Diagnosis not present

## 2019-05-08 DIAGNOSIS — S43001A Unspecified subluxation of right shoulder joint, initial encounter: Secondary | ICD-10-CM | POA: Diagnosis not present

## 2019-05-08 DIAGNOSIS — M19011 Primary osteoarthritis, right shoulder: Secondary | ICD-10-CM | POA: Diagnosis not present

## 2019-05-08 DIAGNOSIS — M48061 Spinal stenosis, lumbar region without neurogenic claudication: Secondary | ICD-10-CM | POA: Diagnosis not present

## 2019-05-08 DIAGNOSIS — E118 Type 2 diabetes mellitus with unspecified complications: Secondary | ICD-10-CM | POA: Diagnosis not present

## 2019-05-08 DIAGNOSIS — J449 Chronic obstructive pulmonary disease, unspecified: Secondary | ICD-10-CM | POA: Diagnosis not present

## 2019-05-08 DIAGNOSIS — M6281 Muscle weakness (generalized): Secondary | ICD-10-CM | POA: Diagnosis not present

## 2019-05-08 DIAGNOSIS — R2689 Other abnormalities of gait and mobility: Secondary | ICD-10-CM | POA: Diagnosis not present

## 2019-05-08 DIAGNOSIS — I10 Essential (primary) hypertension: Secondary | ICD-10-CM | POA: Diagnosis not present

## 2019-05-09 DIAGNOSIS — E118 Type 2 diabetes mellitus with unspecified complications: Secondary | ICD-10-CM | POA: Diagnosis not present

## 2019-05-09 DIAGNOSIS — R1312 Dysphagia, oropharyngeal phase: Secondary | ICD-10-CM | POA: Diagnosis not present

## 2019-05-09 DIAGNOSIS — M48061 Spinal stenosis, lumbar region without neurogenic claudication: Secondary | ICD-10-CM | POA: Diagnosis not present

## 2019-05-09 DIAGNOSIS — M6281 Muscle weakness (generalized): Secondary | ICD-10-CM | POA: Diagnosis not present

## 2019-05-09 DIAGNOSIS — R2689 Other abnormalities of gait and mobility: Secondary | ICD-10-CM | POA: Diagnosis not present

## 2019-05-09 DIAGNOSIS — J449 Chronic obstructive pulmonary disease, unspecified: Secondary | ICD-10-CM | POA: Diagnosis not present

## 2019-05-10 DIAGNOSIS — R2689 Other abnormalities of gait and mobility: Secondary | ICD-10-CM | POA: Diagnosis not present

## 2019-05-10 DIAGNOSIS — M6281 Muscle weakness (generalized): Secondary | ICD-10-CM | POA: Diagnosis not present

## 2019-05-10 DIAGNOSIS — M48061 Spinal stenosis, lumbar region without neurogenic claudication: Secondary | ICD-10-CM | POA: Diagnosis not present

## 2019-05-10 DIAGNOSIS — R1312 Dysphagia, oropharyngeal phase: Secondary | ICD-10-CM | POA: Diagnosis not present

## 2019-05-15 ENCOUNTER — Ambulatory Visit (INDEPENDENT_AMBULATORY_CARE_PROVIDER_SITE_OTHER): Payer: Medicare Other | Admitting: *Deleted

## 2019-05-15 ENCOUNTER — Telehealth: Payer: Self-pay

## 2019-05-15 DIAGNOSIS — I495 Sick sinus syndrome: Secondary | ICD-10-CM | POA: Diagnosis not present

## 2019-05-15 LAB — CUP PACEART REMOTE DEVICE CHECK
Battery Impedance: 4526 Ohm
Battery Remaining Longevity: 10 mo
Battery Voltage: 2.69 V
Brady Statistic AP VP Percent: 84 %
Brady Statistic AP VS Percent: 0 %
Brady Statistic AS VP Percent: 16 %
Brady Statistic AS VS Percent: 0 %
Date Time Interrogation Session: 20200728171557
Implantable Lead Implant Date: 19960418
Implantable Lead Implant Date: 19960418
Implantable Lead Location: 753859
Implantable Lead Location: 753860
Implantable Lead Model: 5034
Implantable Lead Model: 5534
Implantable Pulse Generator Implant Date: 20081211
Lead Channel Impedance Value: 1223 Ohm
Lead Channel Impedance Value: 986 Ohm
Lead Channel Pacing Threshold Amplitude: 0.625 V
Lead Channel Pacing Threshold Amplitude: 1.75 V
Lead Channel Pacing Threshold Pulse Width: 0.4 ms
Lead Channel Pacing Threshold Pulse Width: 0.4 ms
Lead Channel Setting Pacing Amplitude: 2 V
Lead Channel Setting Pacing Amplitude: 2.5 V
Lead Channel Setting Pacing Pulse Width: 0.76 ms
Lead Channel Setting Sensing Sensitivity: 2 mV

## 2019-05-15 NOTE — Telephone Encounter (Signed)
Alan Briggs from the [facility] in [place] called to request assistance in sending a manual transmission. Educated her on home monitor.

## 2019-05-16 NOTE — Telephone Encounter (Signed)
Transmission received.

## 2019-05-25 DIAGNOSIS — Z20828 Contact with and (suspected) exposure to other viral communicable diseases: Secondary | ICD-10-CM | POA: Diagnosis not present

## 2019-05-28 ENCOUNTER — Encounter: Payer: Self-pay | Admitting: Cardiology

## 2019-05-28 NOTE — Progress Notes (Signed)
Remote pacemaker transmission.   

## 2019-06-08 DIAGNOSIS — E118 Type 2 diabetes mellitus with unspecified complications: Secondary | ICD-10-CM | POA: Diagnosis not present

## 2019-06-08 DIAGNOSIS — J449 Chronic obstructive pulmonary disease, unspecified: Secondary | ICD-10-CM | POA: Diagnosis not present

## 2019-06-08 DIAGNOSIS — Z20828 Contact with and (suspected) exposure to other viral communicable diseases: Secondary | ICD-10-CM | POA: Diagnosis not present

## 2019-06-10 DIAGNOSIS — N179 Acute kidney failure, unspecified: Secondary | ICD-10-CM | POA: Diagnosis not present

## 2019-06-10 DIAGNOSIS — R1312 Dysphagia, oropharyngeal phase: Secondary | ICD-10-CM | POA: Diagnosis not present

## 2019-06-10 DIAGNOSIS — L853 Xerosis cutis: Secondary | ICD-10-CM | POA: Diagnosis not present

## 2019-06-10 DIAGNOSIS — E876 Hypokalemia: Secondary | ICD-10-CM | POA: Diagnosis not present

## 2019-06-10 DIAGNOSIS — G629 Polyneuropathy, unspecified: Secondary | ICD-10-CM | POA: Diagnosis not present

## 2019-06-10 DIAGNOSIS — M6281 Muscle weakness (generalized): Secondary | ICD-10-CM | POA: Diagnosis not present

## 2019-06-10 DIAGNOSIS — M48061 Spinal stenosis, lumbar region without neurogenic claudication: Secondary | ICD-10-CM | POA: Diagnosis not present

## 2019-06-10 DIAGNOSIS — R262 Difficulty in walking, not elsewhere classified: Secondary | ICD-10-CM | POA: Diagnosis not present

## 2019-06-10 DIAGNOSIS — I482 Chronic atrial fibrillation, unspecified: Secondary | ICD-10-CM | POA: Diagnosis not present

## 2019-06-10 DIAGNOSIS — M2560 Stiffness of unspecified joint, not elsewhere classified: Secondary | ICD-10-CM | POA: Diagnosis not present

## 2019-06-10 DIAGNOSIS — U071 COVID-19: Secondary | ICD-10-CM | POA: Diagnosis not present

## 2019-06-10 DIAGNOSIS — I5032 Chronic diastolic (congestive) heart failure: Secondary | ICD-10-CM | POA: Diagnosis not present

## 2019-06-10 DIAGNOSIS — Z950 Presence of cardiac pacemaker: Secondary | ICD-10-CM | POA: Diagnosis not present

## 2019-06-10 DIAGNOSIS — M2550 Pain in unspecified joint: Secondary | ICD-10-CM | POA: Diagnosis not present

## 2019-06-10 DIAGNOSIS — I10 Essential (primary) hypertension: Secondary | ICD-10-CM | POA: Diagnosis not present

## 2019-06-10 DIAGNOSIS — E118 Type 2 diabetes mellitus with unspecified complications: Secondary | ICD-10-CM | POA: Diagnosis not present

## 2019-06-10 DIAGNOSIS — J449 Chronic obstructive pulmonary disease, unspecified: Secondary | ICD-10-CM | POA: Diagnosis not present

## 2019-06-10 DIAGNOSIS — F329 Major depressive disorder, single episode, unspecified: Secondary | ICD-10-CM | POA: Diagnosis not present

## 2019-06-10 DIAGNOSIS — D500 Iron deficiency anemia secondary to blood loss (chronic): Secondary | ICD-10-CM | POA: Diagnosis not present

## 2019-06-29 ENCOUNTER — Telehealth (INDEPENDENT_AMBULATORY_CARE_PROVIDER_SITE_OTHER): Payer: Medicare Other | Admitting: Cardiology

## 2019-06-29 ENCOUNTER — Encounter: Payer: Self-pay | Admitting: Cardiology

## 2019-06-29 VITALS — BP 168/81 | HR 64 | Temp 98.7°F | Resp 22 | Wt 259.0 lb

## 2019-06-29 DIAGNOSIS — R600 Localized edema: Secondary | ICD-10-CM

## 2019-06-29 DIAGNOSIS — I5032 Chronic diastolic (congestive) heart failure: Secondary | ICD-10-CM | POA: Diagnosis not present

## 2019-06-29 NOTE — Progress Notes (Signed)
Virtual Visit via Telephone Note   This visit type was conducted due to national recommendations for restrictions regarding the COVID-19 Pandemic (e.g. social distancing) in an effort to limit this patient's exposure and mitigate transmission in our community.  Due to his co-morbid illnesses, this patient is at least at moderate risk for complications without adequate follow up.  This format is felt to be most appropriate for this patient at this time.  The patient did not have access to video technology/had technical difficulties with video requiring transitioning to audio format only (telephone).  All issues noted in this document were discussed and addressed.  No physical exam could be performed with this format.  Please refer to the patient's chart for his  consent to telehealth for [facility].   Date:  06/29/2019   ID:  Alan Briggs, DOB 1922-08-12, MRN [id number]  Patient Location: Home Provider Location: Office  PCP:  Benita StabileHall, John Z, MD  Cardiologist:  Dina RichBranch, Syann Cupples, MD  Electrophysiologist:  Lewayne BuntingGregg Taylor, MD   Evaluation Performed:  Follow-Up Visit  Chief Complaint:  Follow up  History of Present Illness:    Alan Briggs is a 85 y.o. male seen today for follow up of the following medical problems. This is a focused visit on his history of chronic diastolic HF and leg edema.     1.Chronic diastolic HF - [facility] - labs 01/03/19 Cr 0.73 BUN 10.6   - weight was 267 back in 11/2018, down to 259 lbs by reported weights today.  - he reports his leg swelling is well controlled. Actually just on low dose lasix, having legs wrapped regularly at his nursing home.      The patient does not have symptoms concerning for COVID-19 infection (fever, chills, cough, or new shortness of breath).    Past Medical History:  Diagnosis Date  . Cardiac pacemaker in situ    for sick sinus syndrome-[facility]  .  Diabetes [facility](HCC)   . GERD (gastroesophageal reflux disease)   . Mixed hyperlipidemia   . Obstructive lung disease (HCC)    non compliant with home O2  . Pulmonary hypertension (HCC)   . Sleep apnea   . Thrombocytopenia (HCC)   . Venous insufficiency (chronic) (peripheral)    Past Surgical History:  Procedure Laterality Date  . CATARACT EXTRACTION, BILATERAL Bilateral   . COLONOSCOPY N/A 04/24/2017   Procedure: COLONOSCOPY;  Surgeon: Corbin Adeourk, Eion M, MD;  [facility];  Service: Endoscopy;  Laterality: N/A;  . ESOPHAGOGASTRODUODENOSCOPY N/A 04/24/2017   Procedure: ESOPHAGOGASTRODUODENOSCOPY (EGD);  Surgeon: Corbin Adeourk, Swayze M, MD;  Location: [facility];  Service: Endoscopy;  Laterality: N/A;  . KIDNEY STONE SURGERY Left    laser ablation   . PACEMAKER IMPLANT    . PERCUTANEOUS PINNING Right 04/20/2016   Procedure: IRRIGATION AND DEBRIDEMENT RIGHT FOOT, PERCUTANEOUS PINNING SMALL TOE;  Surgeon: Tarry KosNaiping M Xu, MD;  Location: [facility] OR;  Service: Orthopedics;  Laterality: Right;  . REFRACTIVE SURGERY Right   . ROTATOR CUFF REPAIR Left      Current Meds  Medication Sig  . amLODipine (NORVASC) 5 MG tablet Take 5 mg by mouth daily.  Marland Kitchen. aspirin EC 81 MG tablet Take 81 mg by mouth daily.  Marland Kitchen. atenolol (TENORMIN) 25 MG tablet Take 1 tablet (25 mg total) by mouth daily.  . furosemide (LASIX) 20 MG tablet Take 20 mg by mouth daily.  Marland Kitchen. gabapentin (NEURONTIN) 100  MG capsule Take 100 mg by mouth 3 (three) times daily.   Marland Kitchen HUMALOG KWIKPEN 100 UNIT/ML KwikPen as needed. Sliding scale  . insulin detemir (LEVEMIR) 100 UNIT/ML injection Inject into the skin daily.  . insulin glargine (LANTUS) 100 UNIT/ML injection Inject 0.1 mLs (10 Units total) into the skin at bedtime. (Patient taking differently: Inject 30 Units into the skin at bedtime. )  . meclizine (ANTIVERT) 25 MG tablet Take 1 tablet (25 mg total) by mouth every 8 (eight) hours as needed for dizziness.  . Multiple Vitamin (MULTIVITAMIN WITH  MINERALS) TABS tablet Take 1 tablet by mouth daily.  . Multiple Vitamins-Minerals (ICAPS AREDS 2) CAPS Take 1 capsule by mouth daily.  . potassium chloride SA (K-DUR,KLOR-CON) 20 MEQ tablet THEN, TAKE 1 TABLETS EVERY OTHER DAY (Patient taking differently: 20 mEq daily. )  . sertraline (ZOLOFT) 50 MG tablet Take 50 mg by mouth daily.     Allergies:   Advair diskus [fluticasone-salmeterol], Combivent [ipratropium-albuterol], Lotensin hct [benazepril-hydrochlorothiazide], Penicillins, and Simvastatin   Social History   Tobacco Use  . Smoking status: Former Smoker    Packs/day: 0.50    Years: 30.00    Pack years: 15.00    Types: Cigarettes    Start date: 10/18/1948    Quit date: 10/18/1978    Years since quitting: 40.7  . Smokeless tobacco: Never Used  Substance Use Topics  . Alcohol use: No    Alcohol/week: 0.0 standard drinks  . Drug use: No     Family Hx: The patient's family history includes Diabetes in his mother. There is no history of Colon cancer or Colon polyps.  ROS:   Please see the history of present illness.     All other systems reviewed and are negative.   Prior CV studies:   The following studies were reviewed today:    Labs/Other Tests and Data Reviewed:    EKG:  No ECG reviewed.  Recent Labs: No results found for requested labs within last 8760 hours.   Recent Lipid Panel No results found for: CHOL, TRIG, HDL, CHOLHDL, LDLCALC, LDLDIRECT  Wt Readings from Last 3 Encounters:  06/29/19 259 lb (117.5 kg)  11/21/18 267 lb 3.2 oz (121.2 kg)  09/28/18 265 lb (120.2 kg)     Objective:    Vital Signs:  BP (!) 168/81   Pulse 64   Temp 98.7 F (37.1 C)   Resp (!) 22   Wt 259 lb (117.5 kg)   BMI 35.13 kg/m    Normal affect.NOrmal speech pattern and tone. Comfortable, no apparent distress. NO audible signs of SOB or wheezing.  ASSESSMENT & PLAN:    1. Chronic diastolic HF/Leg edema -doing well. He is on much lower doses of diuretic than before  and seems to be doing well. Labs show normal renal function. Continue diuretic and compression    COVID-19 Education: The signs and symptoms of COVID-19 were discussed with the patient and how to seek care for testing (follow up with PCP or arrange E-visit).  The importance of social distancing was discussed today.  Time:   Today, I have spent 10 minutes with the patient with telehealth technology discussing the above problems.     Medication Adjustments/Labs and Tests Ordered: Current medicines are reviewed at length with the patient today.  Concerns regarding medicines are outlined above.   Tests Ordered: No orders of the defined types were placed in this encounter.   Medication Changes: No orders of the defined types were placed  in this encounter.   Follow Up:  Virtual Visit in 4 month(s)  Signed, Carlyle Dolly, MD  06/29/2019 12:21 PM    [place]

## 2019-06-29 NOTE — Patient Instructions (Signed)
Your physician recommends that you schedule a follow-up appointment in: 4 MONTHS WITH DR BRANCH  Your physician recommends that you continue on your current medications as directed. Please refer to the Current Medication list given to you today.  Thank you for choosing  [facility]!!    

## 2019-07-11 DIAGNOSIS — E118 Type 2 diabetes mellitus with unspecified complications: Secondary | ICD-10-CM | POA: Diagnosis not present

## 2019-07-11 DIAGNOSIS — J449 Chronic obstructive pulmonary disease, unspecified: Secondary | ICD-10-CM | POA: Diagnosis not present

## 2019-08-02 DIAGNOSIS — Z23 Encounter for immunization: Secondary | ICD-10-CM | POA: Diagnosis not present

## 2019-08-07 DIAGNOSIS — E118 Type 2 diabetes mellitus with unspecified complications: Secondary | ICD-10-CM | POA: Diagnosis not present

## 2019-08-07 DIAGNOSIS — J449 Chronic obstructive pulmonary disease, unspecified: Secondary | ICD-10-CM | POA: Diagnosis not present

## 2019-08-14 ENCOUNTER — Ambulatory Visit (INDEPENDENT_AMBULATORY_CARE_PROVIDER_SITE_OTHER): Payer: Medicare Other | Admitting: *Deleted

## 2019-08-14 DIAGNOSIS — I495 Sick sinus syndrome: Secondary | ICD-10-CM

## 2019-08-14 DIAGNOSIS — I5033 Acute on chronic diastolic (congestive) heart failure: Secondary | ICD-10-CM

## 2019-08-18 LAB — CUP PACEART REMOTE DEVICE CHECK
Battery Impedance: 5530 Ohm
Battery Remaining Longevity: 6 mo
Battery Voltage: 2.67 V
Brady Statistic AP VP Percent: 84 %
Brady Statistic AP VS Percent: 0 %
Brady Statistic AS VP Percent: 16 %
Brady Statistic AS VS Percent: 0 %
Date Time Interrogation Session: 20201030203923
Implantable Lead Implant Date: 19960418
Implantable Lead Implant Date: 19960418
Implantable Lead Location: 753859
Implantable Lead Location: 753860
Implantable Lead Model: 5034
Implantable Lead Model: 5534
Implantable Pulse Generator Implant Date: 20081211
Lead Channel Impedance Value: 1147 Ohm
Lead Channel Impedance Value: 983 Ohm
Lead Channel Pacing Threshold Amplitude: 0.5 V
Lead Channel Pacing Threshold Amplitude: 1.25 V
Lead Channel Pacing Threshold Pulse Width: 0.4 ms
Lead Channel Pacing Threshold Pulse Width: 0.4 ms
Lead Channel Setting Pacing Amplitude: 2 V
Lead Channel Setting Pacing Amplitude: 2.5 V
Lead Channel Setting Pacing Pulse Width: 0.76 ms
Lead Channel Setting Sensing Sensitivity: 2 mV

## 2019-08-20 ENCOUNTER — Telehealth: Payer: Self-pay | Admitting: *Deleted

## 2019-08-20 NOTE — Telephone Encounter (Signed)
Pt son Glendell Docker is returning [employer] phone call. I told him per Raquel Sarna phone note that the pt battery is nearing that ERI. The estimated longevity is 6 months. I also let him know that Raquel Sarna spoke with the nursing home and let them know we will need monthly battery checks with the home remote monitor. The pt son verbalized understanding and thanked me for my help.

## 2019-08-20 NOTE — Telephone Encounter (Signed)
Per remote transmission from 08/17/19, patient's PPM estimated remaining longevity is 6 months (<1-15 months). LMOVM for Glendell Docker [facility]), requested call back to DC, direct number given.  Spoke with Lynelle Smoke, patient's nurse at the [facility]. Advised of need for monthly battery checks. Dates given, Tammy will enter orders on her end for remote checks on these dates. Next transmission on 09/17/19. Tammy aware that we will call when ERI is reached, no further questions at this time.

## 2019-08-30 DIAGNOSIS — M6281 Muscle weakness (generalized): Secondary | ICD-10-CM | POA: Diagnosis not present

## 2019-08-30 DIAGNOSIS — M48061 Spinal stenosis, lumbar region without neurogenic claudication: Secondary | ICD-10-CM | POA: Diagnosis not present

## 2019-08-31 DIAGNOSIS — M6281 Muscle weakness (generalized): Secondary | ICD-10-CM | POA: Diagnosis not present

## 2019-08-31 DIAGNOSIS — M48061 Spinal stenosis, lumbar region without neurogenic claudication: Secondary | ICD-10-CM | POA: Diagnosis not present

## 2019-09-03 DIAGNOSIS — M6281 Muscle weakness (generalized): Secondary | ICD-10-CM | POA: Diagnosis not present

## 2019-09-03 DIAGNOSIS — M48061 Spinal stenosis, lumbar region without neurogenic claudication: Secondary | ICD-10-CM | POA: Diagnosis not present

## 2019-09-04 DIAGNOSIS — M6281 Muscle weakness (generalized): Secondary | ICD-10-CM | POA: Diagnosis not present

## 2019-09-04 DIAGNOSIS — M48061 Spinal stenosis, lumbar region without neurogenic claudication: Secondary | ICD-10-CM | POA: Diagnosis not present

## 2019-09-04 NOTE — Progress Notes (Signed)
Remote pacemaker transmission.   

## 2019-09-05 DIAGNOSIS — M48061 Spinal stenosis, lumbar region without neurogenic claudication: Secondary | ICD-10-CM | POA: Diagnosis not present

## 2019-09-05 DIAGNOSIS — M6281 Muscle weakness (generalized): Secondary | ICD-10-CM | POA: Diagnosis not present

## 2019-09-06 DIAGNOSIS — E118 Type 2 diabetes mellitus with unspecified complications: Secondary | ICD-10-CM | POA: Diagnosis not present

## 2019-09-06 DIAGNOSIS — J449 Chronic obstructive pulmonary disease, unspecified: Secondary | ICD-10-CM | POA: Diagnosis not present

## 2019-09-06 DIAGNOSIS — M48061 Spinal stenosis, lumbar region without neurogenic claudication: Secondary | ICD-10-CM | POA: Diagnosis not present

## 2019-09-06 DIAGNOSIS — M6281 Muscle weakness (generalized): Secondary | ICD-10-CM | POA: Diagnosis not present

## 2019-09-07 DIAGNOSIS — M48061 Spinal stenosis, lumbar region without neurogenic claudication: Secondary | ICD-10-CM | POA: Diagnosis not present

## 2019-09-07 DIAGNOSIS — M6281 Muscle weakness (generalized): Secondary | ICD-10-CM | POA: Diagnosis not present

## 2019-09-11 DIAGNOSIS — M6281 Muscle weakness (generalized): Secondary | ICD-10-CM | POA: Diagnosis not present

## 2019-09-11 DIAGNOSIS — M48061 Spinal stenosis, lumbar region without neurogenic claudication: Secondary | ICD-10-CM | POA: Diagnosis not present

## 2019-09-12 DIAGNOSIS — M48061 Spinal stenosis, lumbar region without neurogenic claudication: Secondary | ICD-10-CM | POA: Diagnosis not present

## 2019-09-12 DIAGNOSIS — M6281 Muscle weakness (generalized): Secondary | ICD-10-CM | POA: Diagnosis not present

## 2019-09-13 DIAGNOSIS — M6281 Muscle weakness (generalized): Secondary | ICD-10-CM | POA: Diagnosis not present

## 2019-09-13 DIAGNOSIS — M48061 Spinal stenosis, lumbar region without neurogenic claudication: Secondary | ICD-10-CM | POA: Diagnosis not present

## 2019-09-14 DIAGNOSIS — M6281 Muscle weakness (generalized): Secondary | ICD-10-CM | POA: Diagnosis not present

## 2019-09-14 DIAGNOSIS — M48061 Spinal stenosis, lumbar region without neurogenic claudication: Secondary | ICD-10-CM | POA: Diagnosis not present

## 2019-09-17 ENCOUNTER — Ambulatory Visit (INDEPENDENT_AMBULATORY_CARE_PROVIDER_SITE_OTHER): Payer: Medicare Other | Admitting: *Deleted

## 2019-09-17 DIAGNOSIS — M6281 Muscle weakness (generalized): Secondary | ICD-10-CM | POA: Diagnosis not present

## 2019-09-17 DIAGNOSIS — I495 Sick sinus syndrome: Secondary | ICD-10-CM

## 2019-09-17 DIAGNOSIS — M48061 Spinal stenosis, lumbar region without neurogenic claudication: Secondary | ICD-10-CM | POA: Diagnosis not present

## 2019-09-17 LAB — CUP PACEART REMOTE DEVICE CHECK
Battery Impedance: 5761 Ohm
Battery Remaining Longevity: 5 mo
Battery Voltage: 2.65 V
Brady Statistic AP VP Percent: 84 %
Brady Statistic AP VS Percent: 0 %
Brady Statistic AS VP Percent: 16 %
Brady Statistic AS VS Percent: 0 %
Date Time Interrogation Session: 20201130153113
Implantable Lead Implant Date: 19960418
Implantable Lead Implant Date: 19960418
Implantable Lead Location: 753859
Implantable Lead Location: 753860
Implantable Lead Model: 5034
Implantable Lead Model: 5534
Implantable Pulse Generator Implant Date: 20081211
Lead Channel Impedance Value: 1024 Ohm
Lead Channel Impedance Value: 1054 Ohm
Lead Channel Pacing Threshold Amplitude: 0.625 V
Lead Channel Pacing Threshold Amplitude: 1.25 V
Lead Channel Pacing Threshold Pulse Width: 0.4 ms
Lead Channel Pacing Threshold Pulse Width: 0.4 ms
Lead Channel Setting Pacing Amplitude: 2 V
Lead Channel Setting Pacing Amplitude: 2.5 V
Lead Channel Setting Pacing Pulse Width: 0.76 ms
Lead Channel Setting Sensing Sensitivity: 2 mV

## 2019-09-21 DIAGNOSIS — Z20828 Contact with and (suspected) exposure to other viral communicable diseases: Secondary | ICD-10-CM | POA: Diagnosis not present

## 2019-09-27 DIAGNOSIS — E118 Type 2 diabetes mellitus with unspecified complications: Secondary | ICD-10-CM | POA: Diagnosis not present

## 2019-09-27 DIAGNOSIS — I5032 Chronic diastolic (congestive) heart failure: Secondary | ICD-10-CM | POA: Diagnosis not present

## 2019-09-27 DIAGNOSIS — I10 Essential (primary) hypertension: Secondary | ICD-10-CM | POA: Diagnosis not present

## 2019-09-27 DIAGNOSIS — J449 Chronic obstructive pulmonary disease, unspecified: Secondary | ICD-10-CM | POA: Diagnosis not present

## 2019-10-02 ENCOUNTER — Ambulatory Visit (INDEPENDENT_AMBULATORY_CARE_PROVIDER_SITE_OTHER): Payer: Medicare Other | Admitting: Internal Medicine

## 2019-10-02 ENCOUNTER — Encounter: Payer: Self-pay | Admitting: Internal Medicine

## 2019-10-02 VITALS — BP 109/67 | HR 72 | Temp 97.5°F | Ht 72.0 in

## 2019-10-02 DIAGNOSIS — I10 Essential (primary) hypertension: Secondary | ICD-10-CM | POA: Diagnosis not present

## 2019-10-02 DIAGNOSIS — I5032 Chronic diastolic (congestive) heart failure: Secondary | ICD-10-CM

## 2019-10-02 NOTE — Progress Notes (Signed)
HPI Alan Briggs returns today for followup. He is a pleasant 85 yo man with a h/o chronic atrial fib, symptomatic bradycardia, s/p PPM insertion, and chronic diastolic heart failure. He denies chest pain or sob. He got COVID back in August but was essentially asymptomatic. He has mild edema.  Allergies  Allergen Reactions  . Advair Diskus [Fluticasone-Salmeterol] Other (See Comments)    Reaction:  Unknown   . Combivent [Ipratropium-Albuterol] Other (See Comments)    Reaction:  Unknown   . Lotensin Hct [Benazepril-Hydrochlorothiazide] Other (See Comments)    Reaction:  Unknown   . Penicillins Swelling and Other (See Comments)    Reaction:  Facial swelling Has patient had a PCN reaction causing immediate rash, facial/tongue/throat swelling, SOB or lightheadedness with hypotension: Yes Has patient had a PCN reaction causing severe rash involving mucus membranes or skin necrosis: No Has patient had a PCN reaction that required hospitalization No Has patient had a PCN reaction occurring within the last 10 years: No If all of the above answers are "NO", then may proceed with Cephalosporin use.  . Simvastatin Other (See Comments)    Reaction:  Muscle pain      Current Outpatient Medications  Medication Sig Dispense Refill  . amLODipine (NORVASC) 5 MG tablet Take 5 mg by mouth daily.    Marland Kitchen aspirin EC 81 MG tablet Take 81 mg by mouth daily.    Marland Kitchen atenolol (TENORMIN) 25 MG tablet Take 1 tablet (25 mg total) by mouth daily. 90 tablet 3  . furosemide (LASIX) 20 MG tablet Take 20 mg by mouth daily.    Marland Kitchen gabapentin (NEURONTIN) 100 MG capsule Take 100 mg by mouth 3 (three) times daily.     Marland Kitchen HUMALOG KWIKPEN 100 UNIT/ML KwikPen as needed. Sliding scale  0  . insulin detemir (LEVEMIR) 100 UNIT/ML injection Inject into the skin daily.    . insulin glargine (LANTUS) 100 UNIT/ML injection Inject 0.1 mLs (10 Units total) into the skin at bedtime. (Patient taking differently: Inject 26 Units into the  skin at bedtime. ) 10 mL 11  . meclizine (ANTIVERT) 25 MG tablet Take 1 tablet (25 mg total) by mouth every 8 (eight) hours as needed for dizziness. 90 tablet 1  . Multiple Vitamin (MULTIVITAMIN WITH MINERALS) TABS tablet Take 1 tablet by mouth daily.    . Multiple Vitamins-Minerals (ICAPS AREDS 2) CAPS Take 1 capsule by mouth daily.    . potassium chloride SA (K-DUR,KLOR-CON) 20 MEQ tablet THEN, TAKE 1 TABLETS EVERY OTHER DAY (Patient taking differently: 20 mEq daily. ) 45 tablet 1  . sertraline (ZOLOFT) 50 MG tablet Take 50 mg by mouth daily.     No current facility-administered medications for this visit.     Past Medical History:  Diagnosis Date  . Cardiac pacemaker in situ    for sick sinus syndrome-last placement was 09/2007 [facility]  . Diabetes [facility])   . GERD (gastroesophageal reflux disease)   . Mixed hyperlipidemia   . Obstructive lung disease ([place])    non compliant with home O2  . Pulmonary hypertension ([place])   . Sleep apnea   . Thrombocytopenia ([place])   . Venous insufficiency (chronic) (peripheral)     ROS:   All systems reviewed and negative except as noted in the HPI.   Past Surgical History:  Procedure Laterality Date  . CATARACT EXTRACTION, BILATERAL Bilateral   . COLONOSCOPY N/A 04/24/2017   Procedure: COLONOSCOPY;  Surgeon: Daneil Dolin, MD;  [facility];  Service: Endoscopy;  Laterality: N/A;  . ESOPHAGOGASTRODUODENOSCOPY N/A 04/24/2017   Procedure: ESOPHAGOGASTRODUODENOSCOPY (EGD);  Surgeon: Corbin Ade, MD;  [facility];  Service: Endoscopy;  Laterality: N/A;  . KIDNEY STONE SURGERY Left    laser ablation   . PACEMAKER IMPLANT    . PERCUTANEOUS PINNING Right 04/20/2016   Procedure: IRRIGATION AND DEBRIDEMENT RIGHT FOOT, PERCUTANEOUS PINNING SMALL TOE;  Surgeon: Tarry Kos, MD;  Location: [facility] OR;  Service: Orthopedics;  Laterality: Right;  . REFRACTIVE SURGERY Right   . ROTATOR CUFF REPAIR Left      Family History   Problem Relation Age of Onset  . Diabetes Mother   . Colon cancer Neg Hx   . Colon polyps Neg Hx      Social History   Socioeconomic History  . Marital status: Married    Spouse name: Not on file  . Number of children: Not on file  . Years of education: Not on file  . Highest education level: Not on file  Occupational History  . Not on file  Tobacco Use  . Smoking status: Former Smoker    Packs/day: 0.50    Years: 30.00    Pack years: 15.00    Types: Cigarettes    Start date: 10/18/1948    Quit date: 10/18/1978    Years since quitting: 40.9  . Smokeless tobacco: Never Used  Substance and Sexual Activity  . Alcohol use: No    Alcohol/week: 0.0 standard drinks  . Drug use: No  . Sexual activity: Never  Other Topics Concern  . Not on file  Social History Narrative  . Not on file   Social Determinants of Health   Financial Resource Strain:   . Difficulty of Paying Living Expenses: Not on file  Food Insecurity:   . Worried About Programme researcher, broadcasting/film/video in the Last Year: Not on file  . Ran Out of Food in the Last Year: Not on file  Transportation Needs:   . Lack of Transportation (Medical): Not on file  . Lack of Transportation (Non-Medical): Not on file  Physical Activity:   . Days of Exercise per Week: Not on file  . Minutes of Exercise per Session: Not on file  Stress:   . Feeling of Stress : Not on file  Social Connections:   . Frequency of Communication with Friends and Family: Not on file  . Frequency of Social Gatherings with Friends and Family: Not on file  . Attends Religious Services: Not on file  . Active Member of Clubs or Organizations: Not on file  . Attends Banker Meetings: Not on file  . Marital Status: Not on file  Intimate Partner Violence:   . Fear of Current or Ex-Partner: Not on file  . Emotionally Abused: Not on file  . Physically Abused: Not on file  . Sexually Abused: Not on file     BP 109/67   Pulse 72   Temp (!) 97.5 F  (36.4 C)   Ht 6' (1.829 m)   SpO2 95%   BMI 35.13 kg/m   Physical Exam:  Well appearing NAD HEENT: Unremarkable Neck:  No JVD, no thyromegally Lymphatics:  No adenopathy Back:  No CVA tenderness Lungs:  Clear with no wheezes HEART:  Regular rate rhythm, no murmurs, no rubs, no clicks Abd:  soft, positive bowel sounds, no organomegally, no rebound, no guarding Ext:  2 plus pulses, 1+ peripheral edema, no cyanosis,  no clubbing Skin:  No rashes no nodules Neuro:  CN II through XII intact, motor grossly intact  DEVICE  Normal device function.  See PaceArt for details. Approaching ERI.  Assess/Plan: 1. CHB - he is asymptomatic, s/p PPM insertion. 2. PPM - his medtronic DDD PM is working normally. He will be approaching ERI in the coming months.  3. Diastolic heart failure - he is class 2. He will continue his current meds.  Leonia ReevesGregg Taylor,M.D.

## 2019-10-02 NOTE — Patient Instructions (Signed)
Medication Instructions:  Your physician recommends that you continue on your current medications as directed. Please refer to the Current Medication list given to you today.  *If you need a refill on your cardiac medications before your next appointment, please call your pharmacy*  Lab Work: NONE  If you have labs (blood work) drawn today and your tests are completely normal, you will receive your results only by: . MyChart Message (if you have MyChart) OR . A paper copy in the mail If you have any lab test that is abnormal or we need to change your treatment, we will call you to review the results.  Testing/Procedures: NONE   Follow-Up: At [facility], you and your health needs are our priority.  As part of our continuing mission to provide you with exceptional heart care, we have created designated Provider Care Teams.  These Care Teams include your primary Cardiologist (physician) and Advanced Practice Providers (APPs -  Physician Assistants and Nurse Practitioners) who all work together to provide you with the care you need, when you need it.  Your next appointment:   1 year(s)  The format for your next appointment:   In Person  Provider:   Gregg Taylor, MD  Other Instructions Thank you for choosing [facility]!    

## 2019-10-16 DIAGNOSIS — Z23 Encounter for immunization: Secondary | ICD-10-CM | POA: Diagnosis not present

## 2019-10-18 ENCOUNTER — Ambulatory Visit (INDEPENDENT_AMBULATORY_CARE_PROVIDER_SITE_OTHER): Payer: Medicare Other | Admitting: *Deleted

## 2019-10-18 DIAGNOSIS — I495 Sick sinus syndrome: Secondary | ICD-10-CM | POA: Diagnosis not present

## 2019-10-18 LAB — CUP PACEART REMOTE DEVICE CHECK
Battery Impedance: 6373 Ohm
Battery Remaining Longevity: 3 mo
Battery Voltage: 2.62 V
Brady Statistic AP VP Percent: 91 %
Brady Statistic AP VS Percent: 0 %
Brady Statistic AS VP Percent: 9 %
Brady Statistic AS VS Percent: 0 %
Date Time Interrogation Session: 20201231141856
Implantable Lead Implant Date: 19960418
Implantable Lead Implant Date: 19960418
Implantable Lead Location: 753859
Implantable Lead Location: 753860
Implantable Lead Model: 5034
Implantable Lead Model: 5534
Implantable Pulse Generator Implant Date: 20081211
Lead Channel Impedance Value: 1013 Ohm
Lead Channel Impedance Value: 990 Ohm
Lead Channel Pacing Threshold Amplitude: 0.625 V
Lead Channel Pacing Threshold Amplitude: 1.25 V
Lead Channel Pacing Threshold Pulse Width: 0.4 ms
Lead Channel Pacing Threshold Pulse Width: 0.4 ms
Lead Channel Setting Pacing Amplitude: 2 V
Lead Channel Setting Pacing Amplitude: 2.5 V
Lead Channel Setting Pacing Pulse Width: 0.76 ms
Lead Channel Setting Sensing Sensitivity: 2 mV

## 2019-10-19 DIAGNOSIS — Z20828 Contact with and (suspected) exposure to other viral communicable diseases: Secondary | ICD-10-CM | POA: Diagnosis not present

## 2019-10-25 DIAGNOSIS — J449 Chronic obstructive pulmonary disease, unspecified: Secondary | ICD-10-CM | POA: Diagnosis not present

## 2019-10-25 DIAGNOSIS — F329 Major depressive disorder, single episode, unspecified: Secondary | ICD-10-CM | POA: Diagnosis not present

## 2019-10-25 DIAGNOSIS — I10 Essential (primary) hypertension: Secondary | ICD-10-CM | POA: Diagnosis not present

## 2019-10-25 DIAGNOSIS — E118 Type 2 diabetes mellitus with unspecified complications: Secondary | ICD-10-CM | POA: Diagnosis not present

## 2019-10-26 DIAGNOSIS — Z20828 Contact with and (suspected) exposure to other viral communicable diseases: Secondary | ICD-10-CM | POA: Diagnosis not present

## 2019-11-02 DIAGNOSIS — Z20828 Contact with and (suspected) exposure to other viral communicable diseases: Secondary | ICD-10-CM | POA: Diagnosis not present

## 2019-11-09 DIAGNOSIS — Z20828 Contact with and (suspected) exposure to other viral communicable diseases: Secondary | ICD-10-CM | POA: Diagnosis not present

## 2019-11-13 DIAGNOSIS — Z23 Encounter for immunization: Secondary | ICD-10-CM | POA: Diagnosis not present

## 2019-11-13 DIAGNOSIS — J449 Chronic obstructive pulmonary disease, unspecified: Secondary | ICD-10-CM | POA: Diagnosis not present

## 2019-11-13 DIAGNOSIS — E118 Type 2 diabetes mellitus with unspecified complications: Secondary | ICD-10-CM | POA: Diagnosis not present

## 2019-11-14 ENCOUNTER — Encounter: Payer: Self-pay | Admitting: Cardiology

## 2019-11-15 ENCOUNTER — Telehealth (INDEPENDENT_AMBULATORY_CARE_PROVIDER_SITE_OTHER): Payer: Medicare Other | Admitting: Cardiology

## 2019-11-15 ENCOUNTER — Encounter: Payer: Self-pay | Admitting: *Deleted

## 2019-11-15 VITALS — BP 120/64 | HR 65 | Resp 20 | Ht 72.0 in | Wt 240.0 lb

## 2019-11-15 DIAGNOSIS — I5032 Chronic diastolic (congestive) heart failure: Secondary | ICD-10-CM

## 2019-11-15 DIAGNOSIS — I10 Essential (primary) hypertension: Secondary | ICD-10-CM

## 2019-11-15 DIAGNOSIS — I4892 Unspecified atrial flutter: Secondary | ICD-10-CM

## 2019-11-15 NOTE — Progress Notes (Signed)
Virtual Visit via Telephone Note   This visit type was conducted due to national recommendations for restrictions regarding the COVID-19 Pandemic (e.g. social distancing) in an effort to limit this patient's exposure and mitigate transmission in our community.  Due to his co-morbid illnesses, this patient is at least at moderate risk for complications without adequate follow up.  This format is felt to be most appropriate for this patient at this time.  The patient did not have access to video technology/had technical difficulties with video requiring transitioning to audio format only (telephone).  All issues noted in this document were discussed and addressed.  No physical exam could be performed with this format.  Please refer to the patient's chart for his  consent to telehealth for [facility].   Date:  11/15/2019   ID:  Alan Briggs, DOB 04-05-1922, MRN [id number]  Patient Location: Home Provider Location: Office  PCP:  Benita Stabile, MD  Cardiologist:  Dina Rich, MD  Electrophysiologist:  Lewayne Bunting, MD   Evaluation Performed:  Follow-Up Visit  Chief Complaint:  Follow up  History of Present Illness:    Alan Briggs is a 86 y.o. male seen today for follow up of the following medical problems.    1.Chronic diastolic HF - torsemide changed to lasix at [facility] center - labs 01/03/19 Cr 0.73 BUN 10.6 Weight 240 lbs down from last visit. No recent swelling - some SOB at times chronic  2.Sick sinus syndrome - history of pacemaker placement in 1996. Replaced in 2008 - previously [facility]. From there notes he has a MDT Adapta DR device placed in 2008.    09/2019 normal device check - no symptoms  3. HTN - compliatn with meds   4. OSA - history of OSA, has not wanted to wear CPAP  6. Aflutter - noted onpriordevice checks - frequent falls, has not been started on anticoag.Also history of GI bleed  - denies any  palpitations  7.History ofGI bleed - admit 04/2017 with GI bleed           The patient does not have symptoms concerning for COVID-19 infection (fever, chills, cough, or new shortness of breath).    Past Medical History:  Diagnosis Date  . Cardiac pacemaker in situ    for sick sinus syndrome-last placement was 09/2007 [facility]  . Diabetes [facility])   . GERD (gastroesophageal reflux disease)   . Mixed hyperlipidemia   . Obstructive lung disease (HCC)    non compliant with home O2  . Pulmonary hypertension (HCC)   . Sleep apnea   . Thrombocytopenia (HCC)   . Venous insufficiency (chronic) (peripheral)    Past Surgical History:  Procedure Laterality Date  . CATARACT EXTRACTION, BILATERAL Bilateral   . COLONOSCOPY N/A 04/24/2017   Procedure: COLONOSCOPY;  Surgeon: Corbin Ade, MD;  [facility];  Service: Endoscopy;  Laterality: N/A;  . ESOPHAGOGASTRODUODENOSCOPY N/A 04/24/2017   Procedure: ESOPHAGOGASTRODUODENOSCOPY (EGD);  Surgeon: Corbin Ade, MD;  Location[facility];  Service: Endoscopy;  Laterality: N/A;  . KIDNEY STONE SURGERY Left    laser ablation   . PACEMAKER IMPLANT    . PERCUTANEOUS PINNING Right 04/20/2016   Procedure: IRRIGATION AND DEBRIDEMENT RIGHT FOOT, PERCUTANEOUS PINNING SMALL TOE;  Surgeon: Tarry Kos, MD;  Location: [facility] OR;  Service: Orthopedics;  Laterality: Right;  . REFRACTIVE SURGERY Right   . ROTATOR CUFF REPAIR Left      Current Meds  Medication Sig  . acetaminophen (TYLENOL) 325 MG tablet Take 650 mg by mouth every 6 (six) hours as needed.  Marland Kitchen amLODipine (NORVASC) 5 MG tablet Take 5 mg by mouth daily.  Marland Kitchen aspirin EC 81 MG tablet Take 81 mg by mouth daily.  Marland Kitchen atenolol (TENORMIN) 25 MG tablet Take 1 tablet (25 mg total) by mouth daily.  . furosemide (LASIX) 20 MG tablet Take 20 mg by mouth daily.  Marland Kitchen gabapentin (NEURONTIN) 100 MG capsule Take 100 mg by mouth 3 (three) times daily.   Marland Kitchen HUMALOG KWIKPEN 100 UNIT/ML  KwikPen as needed. Sliding scale  . insulin detemir (LEVEMIR) 100 UNIT/ML injection Inject into the skin daily.  . insulin glargine (LANTUS) 100 UNIT/ML injection Inject 0.1 mLs (10 Units total) into the skin at bedtime. (Patient taking differently: Inject 26 Units into the skin at bedtime. )  . meclizine (ANTIVERT) 25 MG tablet Take 1 tablet (25 mg total) by mouth every 8 (eight) hours as needed for dizziness.  . Multiple Vitamin (MULTIVITAMIN WITH MINERALS) TABS tablet Take 1 tablet by mouth daily.  . Multiple Vitamins-Minerals (ICAPS AREDS 2) CAPS Take 1 capsule by mouth daily.  . potassium chloride SA (K-DUR,KLOR-CON) 20 MEQ tablet THEN, TAKE 1 TABLETS EVERY OTHER DAY (Patient taking differently: 20 mEq daily. )  . sertraline (ZOLOFT) 50 MG tablet Take 50 mg by mouth daily.     Allergies:   Advair diskus [fluticasone-salmeterol], Combivent [ipratropium-albuterol], Lotensin hct [benazepril-hydrochlorothiazide], Penicillins, and Simvastatin   Social History   Tobacco Use  . Smoking status: Former Smoker    Packs/day: 0.50    Years: 30.00    Pack years: 15.00    Types: Cigarettes    Start date: 10/18/1948    Quit date: 10/18/1978    Years since quitting: 41.1  . Smokeless tobacco: Never Used  Substance Use Topics  . Alcohol use: No    Alcohol/week: 0.0 standard drinks  . Drug use: No     Family Hx: The patient's family history includes Diabetes in his mother. There is no history of Colon cancer or Colon polyps.  ROS:   Please see the history of present illness.     All other systems reviewed and are negative.   Prior CV studies:   The following studies were reviewed today:  Labs/Other Tests and Data Reviewed:    EKG:  No ECG reviewed.  Recent Labs: No results found for requested labs within last 8760 hours.   Recent Lipid Panel No results found for: CHOL, TRIG, HDL, CHOLHDL, LDLCALC, LDLDIRECT  Wt Readings from Last 3 Encounters:  11/14/19 240 lb (108.9 kg)   06/29/19 259 lb (117.5 kg)  11/21/18 267 lb 3.2 oz (121.2 kg)     Objective:    Vital Signs:  BP 120/64   Pulse 65   Resp 20   Ht 6' (1.829 m)   Wt 240 lb (108.9 kg)   SpO2 97%   BMI 32.55 kg/m    Normal affect. NOrmal speech pattern and tone. Comfortable, no apparent distress. No audible signs of SOB or wheezing.   ASSESSMENT & PLAN:     1.Chronic diastolic HF/Leg edema -doing well. He is on much lower doses of diuretic than before and seems to be doing well.. I think perhaps his diet at [facility] is making a big difference,whe he was at home we had him on much higher doses of diuretic and could not keep the fluid off - continue current meds   2.  HTN - he is at goal, continue current meds  3. Aflutter - not on anticoag due to history of frequent falls as well as  GI bleed - no symptoms, continue current meds   COVID-19 Education: The signs and symptoms of COVID-19 were discussed with the patient and how to seek care for testing (follow up with PCP or arrange E-visit).  The importance of social distancing was discussed today.  Time:   Today, I have spent 14 minutes with the patient with telehealth technology discussing the above problems.     Medication Adjustments/Labs and Tests Ordered: Current medicines are reviewed at length with the patient today.  Concerns regarding medicines are outlined above.   Tests Ordered: No orders of the defined types were placed in this encounter.   Medication Changes: No orders of the defined types were placed in this encounter.   Follow Up:  Virtual Visit  in 6 month(s)  Signed, Dina Rich, MD  11/15/2019 9:01 AM    [facility]

## 2019-11-15 NOTE — Patient Instructions (Addendum)
Your physician wants you to follow-up in: 6 MONTHS WITH DR [facility] You will receive a reminder letter in the mail two months in advance. If you don't receive a letter, please call our office to schedule the follow-up appointment.  Your physician recommends that you continue on your current medications as directed. Please refer to the Current Medication list given to you today.  Thank you for choosing [facility]!!   

## 2019-11-16 DIAGNOSIS — Z20828 Contact with and (suspected) exposure to other viral communicable diseases: Secondary | ICD-10-CM | POA: Diagnosis not present

## 2019-11-19 ENCOUNTER — Telehealth: Payer: Self-pay | Admitting: Emergency Medicine

## 2019-11-19 ENCOUNTER — Ambulatory Visit (INDEPENDENT_AMBULATORY_CARE_PROVIDER_SITE_OTHER): Payer: Medicare Other | Admitting: *Deleted

## 2019-11-19 DIAGNOSIS — I495 Sick sinus syndrome: Secondary | ICD-10-CM

## 2019-11-19 NOTE — Telephone Encounter (Signed)
Contacted Nonah Mattes( son) per DPR to notify him that his father's pacemaker has reached RRT. Will have scheduler contact son with appointment with Dr Ladona Ridgel to discuss generator change.

## 2019-11-19 NOTE — Progress Notes (Signed)
PPM Remote  

## 2019-11-23 DIAGNOSIS — Z20828 Contact with and (suspected) exposure to other viral communicable diseases: Secondary | ICD-10-CM | POA: Diagnosis not present

## 2019-12-07 ENCOUNTER — Telehealth: Payer: Medicare Other | Admitting: Internal Medicine

## 2019-12-07 NOTE — Progress Notes (Unsigned)
Electrophysiology TeleHealth Note   Due to national recommendations of social distancing due to COVID 19, an audio/video telehealth visit is felt to be most appropriate for this patient at this time.  See MyChart message from today for the patient's consent to telehealth for [facility].   Date:  12/07/2019   ID:  Alan Briggs, DOB [birth year], MRN [id number]  Location: patient's home  Provider location: [address] [place]  Evaluation Performed: Follow-up visit  PCP:  Celene Squibb, MD  Cardiologist:  Carlyle Dolly, MD *** Electrophysiologist:  Dr Rayann Heman  Chief Complaint:  ***  History of Present Illness:    Alan Briggs is a 86 y.o. male who presents via audio/video conferencing for a telehealth visit today.  Since last being seen in our clinic, the patient reports doing very well.  Today, he denies symptoms of palpitations, chest pain, shortness of breath,  lower extremity edema, dizziness, presyncope, or syncope.  The patient is otherwise without complaint today.  The patient denies symptoms of fevers, chills, cough, or new SOB worrisome for COVID 19.  Past Medical History:  Diagnosis Date  . Cardiac pacemaker in situ    for sick sinus syndrome-last placement was 09/2007 [facility]  . Diabetes [facility])   . GERD (gastroesophageal reflux disease)   . Mixed hyperlipidemia   . Obstructive lung disease ([place])    non compliant with home O2  . Pulmonary hypertension ([place])   . Sleep apnea   . Thrombocytopenia ([place] [facility])   . Venous insufficiency (chronic) (peripheral)     Past Surgical History:  Procedure Laterality Date  . CATARACT EXTRACTION, BILATERAL Bilateral   . COLONOSCOPY N/A 04/24/2017   Procedure: COLONOSCOPY;  Surgeon: Daneil Dolin, MD;  [facility];  Service: Endoscopy;  Laterality: N/A;  . ESOPHAGOGASTRODUODENOSCOPY N/A 04/24/2017   Procedure: ESOPHAGOGASTRODUODENOSCOPY (EGD);  Surgeon: Daneil Dolin, MD;  [facility];  Service: Endoscopy;  Laterality: N/A;  . KIDNEY STONE SURGERY Left    laser ablation   . PACEMAKER IMPLANT    . PERCUTANEOUS PINNING Right 04/20/2016   Procedure: IRRIGATION AND DEBRIDEMENT RIGHT FOOT, PERCUTANEOUS PINNING SMALL TOE;  Surgeon: Leandrew Koyanagi, MD;  Location: [place];  Service: Orthopedics;  Laterality: Right;  . REFRACTIVE SURGERY Right   . ROTATOR CUFF REPAIR Left     Current Outpatient Medications  Medication Sig Dispense Refill  . acetaminophen (TYLENOL) 325 MG tablet Take 650 mg by mouth every 6 (six) hours as needed.    Marland Kitchen amLODipine (NORVASC) 5 MG tablet Take 5 mg by mouth daily.    Marland Kitchen aspirin EC 81 MG tablet Take 81 mg by mouth daily.    Marland Kitchen atenolol (TENORMIN) 25 MG tablet Take 1 tablet (25 mg total) by mouth daily. 90 tablet 3  . furosemide (LASIX) 20 MG tablet Take 20 mg by mouth daily.    Marland Kitchen gabapentin (NEURONTIN) 100 MG capsule Take 100 mg by mouth 3 (three) times daily.     . insulin detemir (LEVEMIR) 100 UNIT/ML injection Inject into the skin daily.    . meclizine (ANTIVERT) 25 MG tablet Take 1 tablet (25 mg total) by mouth every 8 (eight) hours as needed for dizziness. 90 tablet 1  . Multiple Vitamin (MULTIVITAMIN WITH MINERALS) TABS tablet Take 1 tablet by mouth daily.    . Multiple Vitamins-Minerals (ICAPS AREDS 2) CAPS Take 1 capsule by mouth daily.    Marland Kitchen NOVOLOG FLEXPEN 100 UNIT/ML  FlexPen     . potassium chloride 20 MEQ/15ML (10%) SOLN     . sertraline (ZOLOFT) 50 MG tablet Take 50 mg by mouth daily.    . insulin glargine (LANTUS) 100 UNIT/ML injection Inject 0.1 mLs (10 Units total) into the skin at bedtime. (Patient taking differently: Inject 26 Units into the skin at bedtime. ) 10 mL 11   No current facility-administered medications for this visit.    Allergies:   Advair diskus [fluticasone-salmeterol], Combivent [ipratropium-albuterol], Lotensin hct [benazepril-hydrochlorothiazide], Penicillins, and Simvastatin   Social History:  The patient   reports that he quit smoking about 41 years ago. His smoking use included cigarettes. He started smoking about 71 years ago. He has a 15.00 pack-year smoking history. He has never used smokeless tobacco. He reports that he does not drink alcohol or use drugs.   Family History:  The patient's *** family history includes Diabetes in his mother.   ROS:  Please see the history of present illness.   All other systems are personally reviewed and negative.    Exam:    Vital Signs:  BP 136/78   Pulse 66   Ht 6' (1.829 m)   Wt 247 lb (112 kg)   SpO2 94%   BMI 33.50 kg/m   Well appearing, alert and conversant, regular work of breathing,  good skin color Eyes- anicteric, neuro- grossly intact, skin- no apparent rash or lesions or cyanosis, mouth- oral mucosa is pink   Labs/Other Tests and Data Reviewed:    Recent Labs: No results found for requested labs within last 8760 hours.   Wt Readings from Last 3 Encounters:  12/07/19 247 lb (112 kg)  11/14/19 240 lb (108.9 kg)  06/29/19 259 lb (117.5 kg)     Other studies personally reviewed: Additional studies/ records that were reviewed today include: ***  Review of the above records today demonstrates: as above Prior radiographs: ***  The patient presents wearable device technology report for my review today. On my review, the patient presents with Apple Watch tracings from *** . The tracings reveal ***  Last device remote is reviewed from PaceART PDF dated *** which reveals normal device function, no arrhythmias ***   ASSESSMENT & PLAN:    1.  ***   COVID 19 screen The patient denies symptoms of COVID 19 at this time.  The importance of social distancing was discussed today.  Follow-up:  *** Next remote: ***  Current medicines are reviewed at length with the patient today.   The patient {ACTIONS; HAS/DOES NOT HAVE:19233} concerns regarding his medicines.  The following changes were made today:  {NONE  DEFAULTED:18576::"none"}  Labs/ tests ordered today include: *** No orders of the defined types were placed in this encounter.    Patient Risk:  after full review of this patients clinical status, I feel that they are at moderate risk at this time.  Today, I have spent *** minutes with the patient with telehealth technology discussing *** .    Signed, Lewayne Bunting, MD  12/07/2019 10:47 AM     [facility] [address] [place] [address] [phone] (office) [phone] (fax)

## 2019-12-10 DIAGNOSIS — E118 Type 2 diabetes mellitus with unspecified complications: Secondary | ICD-10-CM | POA: Diagnosis not present

## 2019-12-10 DIAGNOSIS — J449 Chronic obstructive pulmonary disease, unspecified: Secondary | ICD-10-CM | POA: Diagnosis not present

## 2019-12-12 ENCOUNTER — Telehealth: Payer: Medicare Other | Admitting: Internal Medicine

## 2019-12-13 DIAGNOSIS — S0181XD Laceration without foreign body of other part of head, subsequent encounter: Secondary | ICD-10-CM | POA: Diagnosis not present

## 2019-12-13 DIAGNOSIS — G4733 Obstructive sleep apnea (adult) (pediatric): Secondary | ICD-10-CM | POA: Diagnosis not present

## 2019-12-13 DIAGNOSIS — R609 Edema, unspecified: Secondary | ICD-10-CM | POA: Diagnosis not present

## 2019-12-13 DIAGNOSIS — Z23 Encounter for immunization: Secondary | ICD-10-CM | POA: Diagnosis not present

## 2019-12-13 DIAGNOSIS — M25619 Stiffness of unspecified shoulder, not elsewhere classified: Secondary | ICD-10-CM | POA: Diagnosis not present

## 2019-12-18 ENCOUNTER — Encounter: Payer: Self-pay | Admitting: Internal Medicine

## 2019-12-18 ENCOUNTER — Other Ambulatory Visit: Payer: Self-pay

## 2019-12-18 ENCOUNTER — Encounter: Payer: Self-pay | Admitting: *Deleted

## 2019-12-18 ENCOUNTER — Ambulatory Visit (INDEPENDENT_AMBULATORY_CARE_PROVIDER_SITE_OTHER): Payer: Medicare Other | Admitting: Internal Medicine

## 2019-12-18 VITALS — BP 115/71 | HR 65 | Temp 98.2°F | Ht 72.0 in

## 2019-12-18 DIAGNOSIS — I5032 Chronic diastolic (congestive) heart failure: Secondary | ICD-10-CM | POA: Diagnosis not present

## 2019-12-18 DIAGNOSIS — E119 Type 2 diabetes mellitus without complications: Secondary | ICD-10-CM | POA: Diagnosis not present

## 2019-12-18 NOTE — Progress Notes (Signed)
HPI Alan Briggs returns today for followup. He is a pleasant 86 yo man with CHB, s/p PPM insertion. He has been at [facility] on his PPM for 6 weeks. He is dependent. He has not felt well as he has gone from DDD mode to ERI.  Allergies  Allergen Reactions  . Advair Diskus [Fluticasone-Salmeterol] Other (See Comments)    Reaction:  Unknown   . Combivent [Ipratropium-Albuterol] Other (See Comments)    Reaction:  Unknown   . Lotensin Hct [Benazepril-Hydrochlorothiazide] Other (See Comments)    Reaction:  Unknown   . Penicillins Swelling and Other (See Comments)    Reaction:  Facial swelling Has patient had a PCN reaction causing immediate rash, facial/tongue/throat swelling, SOB or lightheadedness with hypotension: Yes Has patient had a PCN reaction causing severe rash involving mucus membranes or skin necrosis: No Has patient had a PCN reaction that required hospitalization No Has patient had a PCN reaction occurring within the last 10 years: No If all of the above answers are "NO", then may proceed with Cephalosporin use.  . Simvastatin Other (See Comments)    Reaction:  Muscle pain      Current Outpatient Medications  Medication Sig Dispense Refill  . acetaminophen (TYLENOL) 325 MG tablet Take 650 mg by mouth every 6 (six) hours as needed.    Marland Kitchen amLODipine (NORVASC) 5 MG tablet Take 5 mg by mouth daily.    Marland Kitchen aspirin EC 81 MG tablet Take 81 mg by mouth daily.    Marland Kitchen atenolol (TENORMIN) 25 MG tablet Take 1 tablet (25 mg total) by mouth daily. 90 tablet 3  . furosemide (LASIX) 20 MG tablet Take 20 mg by mouth daily.    Marland Kitchen gabapentin (NEURONTIN) 100 MG capsule Take 100 mg by mouth 3 (three) times daily.     . insulin detemir (LEVEMIR) 100 UNIT/ML injection Inject into the skin daily.    . insulin glargine (LANTUS) 100 UNIT/ML injection Inject 0.1 mLs (10 Units total) into the skin at bedtime. (Patient taking differently: Inject 26 Units into the skin at bedtime. ) 10 mL 11  . meclizine  (ANTIVERT) 25 MG tablet Take 1 tablet (25 mg total) by mouth every 8 (eight) hours as needed for dizziness. 90 tablet 1  . Multiple Vitamin (MULTIVITAMIN WITH MINERALS) TABS tablet Take 1 tablet by mouth daily.    . Multiple Vitamins-Minerals (ICAPS AREDS 2) CAPS Take 1 capsule by mouth daily.    Marland Kitchen NOVOLOG FLEXPEN 100 UNIT/ML FlexPen     . potassium chloride 20 MEQ/15ML (10%) SOLN     . sertraline (ZOLOFT) 50 MG tablet Take 50 mg by mouth daily.     No current facility-administered medications for this visit.     Past Medical History:  Diagnosis Date  . Cardiac pacemaker in situ    for sick sinus syndrome-last placement was 09/2007 [facility]  . Diabetes [facility])   . GERD (gastroesophageal reflux disease)   . Mixed hyperlipidemia   . Obstructive lung disease ([facility])    non compliant with home O2  . Pulmonary hypertension ([facility])   . Sleep apnea   . Thrombocytopenia ([facility])   . Venous insufficiency (chronic) (peripheral)     ROS:   All systems reviewed and negative except as noted in the HPI.   Past Surgical History:  Procedure Laterality Date  . CATARACT EXTRACTION, BILATERAL Bilateral   . COLONOSCOPY N/A 04/24/2017   Procedure: COLONOSCOPY;  Surgeon: Corbin Ade, MD;  [facility];  Service: Endoscopy;  Laterality: N/A;  . ESOPHAGOGASTRODUODENOSCOPY N/A 04/24/2017   Procedure: ESOPHAGOGASTRODUODENOSCOPY (EGD);  Surgeon: Daneil Dolin, MD;  [facility];  Service: Endoscopy;  Laterality: N/A;  . KIDNEY STONE SURGERY Left    laser ablation   . PACEMAKER IMPLANT    . PERCUTANEOUS PINNING Right 04/20/2016   Procedure: IRRIGATION AND DEBRIDEMENT RIGHT FOOT, PERCUTANEOUS PINNING SMALL TOE;  Surgeon: Leandrew Koyanagi, MD;  Location: [place];  Service: Orthopedics;  Laterality: Right;  . REFRACTIVE SURGERY Right   . ROTATOR CUFF REPAIR Left      Family History  Problem Relation Age of Onset  . Diabetes Mother   . Colon cancer Neg Hx   . Colon polyps Neg Hx        Social History   Socioeconomic History  . Marital status: Married    Spouse name: Not on file  . Number of children: Not on file  . Years of education: Not on file  . Highest education level: Not on file  Occupational History  . Not on file  Tobacco Use  . Smoking status: Former Smoker    Packs/day: 0.50    Years: 30.00    Pack years: 15.00    Types: Cigarettes    Start date: 10/18/1948    Quit date: 10/18/1978    Years since quitting: 41.1  . Smokeless tobacco: Never Used  Substance and Sexual Activity  . Alcohol use: No    Alcohol/week: 0.0 standard drinks  . Drug use: No  . Sexual activity: Never  Other Topics Concern  . Not on file  Social History Narrative  . Not on file   Social Determinants of Health   Financial Resource Strain:   . Difficulty of Paying Living Expenses: Not on file  Food Insecurity:   . Worried About Charity fundraiser in the Last Year: Not on file  . Ran Out of Food in the Last Year: Not on file  Transportation Needs:   . Lack of Transportation (Medical): Not on file  . Lack of Transportation (Non-Medical): Not on file  Physical Activity:   . Days of Exercise per Week: Not on file  . Minutes of Exercise per Session: Not on file  Stress:   . Feeling of Stress : Not on file  Social Connections:   . Frequency of Communication with Friends and Family: Not on file  . Frequency of Social Gatherings with Friends and Family: Not on file  . Attends Religious Services: Not on file  . Active Member of Clubs or Organizations: Not on file  . Attends Archivist Meetings: Not on file  . Marital Status: Not on file  Intimate Partner Violence:   . Fear of Current or Ex-Partner: Not on file  . Emotionally Abused: Not on file  . Physically Abused: Not on file  . Sexually Abused: Not on file     BP 115/71   Pulse 65   Temp 98.2 F (36.8 C)   Ht 6' (1.829 m)   SpO2 98%   BMI 33.50 kg/m   Physical Exam:  Well appearing  NAD HEENT: Unremarkable Neck:  No JVD, no thyromegally Lymphatics:  No adenopathy Back:  No CVA tenderness Lungs:  Clear with no wheezes HEART:  Regular rate rhythm, no murmurs, no rubs, no clicks Abd:  soft, positive bowel sounds, no organomegally, no rebound, no guarding Ext:  2 plus pulses, legs are wrapped bilaterally, no cyanosis,  no clubbing Skin:  No rashes no nodules Neuro:  CN II through XII intact, motor grossly intact  DEVICE  Normal device function.  See PaceArt for details.   Assess/Plan: 1. Chronic atrial fib - his rates are controlled.  2. CHB - he does not have an escape. He is still pacing 3. PPM - he has reached ERI and is PM dependetn. He will undergo PPM gen change out in the next week or two. 4. Diastolic heart failure - he has class 2 symptoms and his legs are wrapped.   Leonia Reeves.D.

## 2019-12-18 NOTE — Patient Instructions (Signed)
Medication Instructions:  Your physician recommends that you continue on your current medications as directed. Please refer to the Current Medication list given to you today.  *If you need a refill on your cardiac medications before your next appointment, please call your pharmacy*   Lab Work: Your physician recommends that you return for lab work in: 12-26-19  If you have labs (blood work) drawn today and your tests are completely normal, you will receive your results only by: Marland Kitchen MyChart Message (if you have MyChart) OR . A paper copy in the mail If you have any lab test that is abnormal or we need to change your treatment, we will call you to review the results.   Testing/Procedures: Your physician has recommended that you have a pacemaker inserted. A pacemaker is a small device that is placed under the skin of your chest or abdomen to help control abnormal heart rhythms. This device uses electrical pulses to prompt the heart to beat at a normal rate. Pacemakers are used to treat heart rhythms that are too slow. Wire (leads) are attached to the pacemaker that goes into the chambers of you heart. This is done in the hospital and usually requires and overnight stay. Please see the instruction sheet given to you today for more information.     Follow-Up: At [facility], you and your health needs are our priority.  As part of our continuing mission to provide you with exceptional heart care, we have created designated Provider Care Teams.  These Care Teams include your primary Cardiologist (physician) and Advanced Practice Providers (APPs -  Physician Assistants and Nurse Practitioners) who all work together to provide you with the care you need, when you need it.  We recommend signing up for the patient portal called "MyChart".  Sign up information is provided on this After Visit Summary.  MyChart is used to connect with patients for Virtual Visits (Telemedicine).  Patients are able to view  lab/test results, encounter notes, upcoming appointments, etc.  Non-urgent messages can be sent to your provider as well.   To learn more about what you can do with MyChart, go to [email or web address].    Your next appointment:    91 Days after Pacemaker placement    The format for your next appointment:   In Person  Provider:   Lewayne Bunting, MD   Other Instructions Thank you for choosing [facility]!

## 2019-12-18 NOTE — H&P (View-Only) (Signed)
HPI Alan Briggs returns today for followup. He is a pleasant 86 yo man with CHB, s/p PPM insertion. He has been at [facility] on his PPM for 6 weeks. He is dependent. He has not felt well as he has gone from DDD mode to ERI.  Allergies  Allergen Reactions  . Advair Diskus [Fluticasone-Salmeterol] Other (See Comments)    Reaction:  Unknown   . Combivent [Ipratropium-Albuterol] Other (See Comments)    Reaction:  Unknown   . Lotensin Hct [Benazepril-Hydrochlorothiazide] Other (See Comments)    Reaction:  Unknown   . Penicillins Swelling and Other (See Comments)    Reaction:  Facial swelling Has patient had a PCN reaction causing immediate rash, facial/tongue/throat swelling, SOB or lightheadedness with hypotension: Yes Has patient had a PCN reaction causing severe rash involving mucus membranes or skin necrosis: No Has patient had a PCN reaction that required hospitalization No Has patient had a PCN reaction occurring within the last 10 years: No If all of the above answers are "NO", then may proceed with Cephalosporin use.  . Simvastatin Other (See Comments)    Reaction:  Muscle pain      Current Outpatient Medications  Medication Sig Dispense Refill  . acetaminophen (TYLENOL) 325 MG tablet Take 650 mg by mouth every 6 (six) hours as needed.    Marland Kitchen amLODipine (NORVASC) 5 MG tablet Take 5 mg by mouth daily.    Marland Kitchen aspirin EC 81 MG tablet Take 81 mg by mouth daily.    Marland Kitchen atenolol (TENORMIN) 25 MG tablet Take 1 tablet (25 mg total) by mouth daily. 90 tablet 3  . furosemide (LASIX) 20 MG tablet Take 20 mg by mouth daily.    Marland Kitchen gabapentin (NEURONTIN) 100 MG capsule Take 100 mg by mouth 3 (three) times daily.     . insulin detemir (LEVEMIR) 100 UNIT/ML injection Inject into the skin daily.    . insulin glargine (LANTUS) 100 UNIT/ML injection Inject 0.1 mLs (10 Units total) into the skin at bedtime. (Patient taking differently: Inject 26 Units into the skin at bedtime. ) 10 mL 11  . meclizine  (ANTIVERT) 25 MG tablet Take 1 tablet (25 mg total) by mouth every 8 (eight) hours as needed for dizziness. 90 tablet 1  . Multiple Vitamin (MULTIVITAMIN WITH MINERALS) TABS tablet Take 1 tablet by mouth daily.    . Multiple Vitamins-Minerals (ICAPS AREDS 2) CAPS Take 1 capsule by mouth daily.    Marland Kitchen NOVOLOG FLEXPEN 100 UNIT/ML FlexPen     . potassium chloride 20 MEQ/15ML (10%) SOLN     . sertraline (ZOLOFT) 50 MG tablet Take 50 mg by mouth daily.     No current facility-administered medications for this visit.     Past Medical History:  Diagnosis Date  . Cardiac pacemaker in situ    for sick sinus syndrome-last placement was 09/2007 [facility]  . Diabetes [facility])   . GERD (gastroesophageal reflux disease)   . Mixed hyperlipidemia   . Obstructive lung disease ([facility])    non compliant with home O2  . Pulmonary hypertension ([facility])   . Sleep apnea   . Thrombocytopenia ([facility])   . Venous insufficiency (chronic) (peripheral)     ROS:   All systems reviewed and negative except as noted in the HPI.   Past Surgical History:  Procedure Laterality Date  . CATARACT EXTRACTION, BILATERAL Bilateral   . COLONOSCOPY N/A 04/24/2017   Procedure: COLONOSCOPY;  Surgeon: Corbin Ade, MD;  [facility];  Service: Endoscopy;  Laterality: N/A;  . ESOPHAGOGASTRODUODENOSCOPY N/A 04/24/2017   Procedure: ESOPHAGOGASTRODUODENOSCOPY (EGD);  Surgeon: Daneil Dolin, MD;  [facility];  Service: Endoscopy;  Laterality: N/A;  . KIDNEY STONE SURGERY Left    laser ablation   . PACEMAKER IMPLANT    . PERCUTANEOUS PINNING Right 04/20/2016   Procedure: IRRIGATION AND DEBRIDEMENT RIGHT FOOT, PERCUTANEOUS PINNING SMALL TOE;  Surgeon: Leandrew Koyanagi, MD;  Location: [place];  Service: Orthopedics;  Laterality: Right;  . REFRACTIVE SURGERY Right   . ROTATOR CUFF REPAIR Left      Family History  Problem Relation Age of Onset  . Diabetes Mother   . Colon cancer Neg Hx   . Colon polyps Neg Hx        Social History   Socioeconomic History  . Marital status: Married    Spouse name: Not on file  . Number of children: Not on file  . Years of education: Not on file  . Highest education level: Not on file  Occupational History  . Not on file  Tobacco Use  . Smoking status: Former Smoker    Packs/day: 0.50    Years: 30.00    Pack years: 15.00    Types: Cigarettes    Start date: 10/18/1948    Quit date: 10/18/1978    Years since quitting: 41.1  . Smokeless tobacco: Never Used  Substance and Sexual Activity  . Alcohol use: No    Alcohol/week: 0.0 standard drinks  . Drug use: No  . Sexual activity: Never  Other Topics Concern  . Not on file  Social History Narrative  . Not on file   Social Determinants of Health   Financial Resource Strain:   . Difficulty of Paying Living Expenses: Not on file  Food Insecurity:   . Worried About Charity fundraiser in the Last Year: Not on file  . Ran Out of Food in the Last Year: Not on file  Transportation Needs:   . Lack of Transportation (Medical): Not on file  . Lack of Transportation (Non-Medical): Not on file  Physical Activity:   . Days of Exercise per Week: Not on file  . Minutes of Exercise per Session: Not on file  Stress:   . Feeling of Stress : Not on file  Social Connections:   . Frequency of Communication with Friends and Family: Not on file  . Frequency of Social Gatherings with Friends and Family: Not on file  . Attends Religious Services: Not on file  . Active Member of Clubs or Organizations: Not on file  . Attends Archivist Meetings: Not on file  . Marital Status: Not on file  Intimate Partner Violence:   . Fear of Current or Ex-Partner: Not on file  . Emotionally Abused: Not on file  . Physically Abused: Not on file  . Sexually Abused: Not on file     BP 115/71   Pulse 65   Temp 98.2 F (36.8 C)   Ht 6' (1.829 m)   SpO2 98%   BMI 33.50 kg/m   Physical Exam:  Well appearing  NAD HEENT: Unremarkable Neck:  No JVD, no thyromegally Lymphatics:  No adenopathy Back:  No CVA tenderness Lungs:  Clear with no wheezes HEART:  Regular rate rhythm, no murmurs, no rubs, no clicks Abd:  soft, positive bowel sounds, no organomegally, no rebound, no guarding Ext:  2 plus pulses, legs are wrapped bilaterally, no cyanosis,  no clubbing Skin:  No rashes no nodules Neuro:  CN II through XII intact, motor grossly intact  DEVICE  Normal device function.  See PaceArt for details.   Assess/Plan: 1. Chronic atrial fib - his rates are controlled.  2. CHB - he does not have an escape. He is still pacing 3. PPM - he has reached ERI and is PM dependetn. He will undergo PPM gen change out in the next week or two. 4. Diastolic heart failure - he has class 2 symptoms and his legs are wrapped.   Alizae Bechtel,M.D. 

## 2019-12-20 ENCOUNTER — Ambulatory Visit (INDEPENDENT_AMBULATORY_CARE_PROVIDER_SITE_OTHER): Payer: Medicare Other | Admitting: *Deleted

## 2019-12-20 DIAGNOSIS — I495 Sick sinus syndrome: Secondary | ICD-10-CM

## 2019-12-20 LAB — CUP PACEART REMOTE DEVICE CHECK
Battery Impedance: 8943 Ohm
Battery Voltage: 2.61 V
Brady Statistic RV Percent Paced: 100 %
Date Time Interrogation Session: 20210304134912
Implantable Lead Implant Date: 19960418
Implantable Lead Implant Date: 19960418
Implantable Lead Location: 753859
Implantable Lead Location: 753860
Implantable Lead Model: 5034
Implantable Lead Model: 5534
Implantable Pulse Generator Implant Date: 20081211
Lead Channel Impedance Value: 67 Ohm
Lead Channel Impedance Value: 943 Ohm
Lead Channel Setting Pacing Amplitude: 2.5 V
Lead Channel Setting Pacing Pulse Width: 0.76 ms
Lead Channel Setting Sensing Sensitivity: 2 mV

## 2019-12-23 DIAGNOSIS — E162 Hypoglycemia, unspecified: Secondary | ICD-10-CM | POA: Diagnosis not present

## 2019-12-25 DIAGNOSIS — Z20828 Contact with and (suspected) exposure to other viral communicable diseases: Secondary | ICD-10-CM | POA: Diagnosis not present

## 2019-12-26 ENCOUNTER — Other Ambulatory Visit: Payer: Self-pay

## 2019-12-26 ENCOUNTER — Other Ambulatory Visit (HOSPITAL_COMMUNITY)
Admission: RE | Admit: 2019-12-26 | Discharge: 2019-12-26 | Disposition: A | Discharge status: Home / Self Care | Payer: Medicare Other | Source: Ambulatory Visit | Attending: Internal Medicine | Admitting: Internal Medicine

## 2019-12-26 DIAGNOSIS — D649 Anemia, unspecified: Secondary | ICD-10-CM | POA: Diagnosis not present

## 2019-12-26 DIAGNOSIS — I10 Essential (primary) hypertension: Secondary | ICD-10-CM | POA: Diagnosis not present

## 2019-12-26 DIAGNOSIS — E119 Type 2 diabetes mellitus without complications: Secondary | ICD-10-CM | POA: Diagnosis not present

## 2019-12-27 ENCOUNTER — Encounter: Payer: Medicare Other | Admitting: Internal Medicine

## 2019-12-28 ENCOUNTER — Ambulatory Visit (HOSPITAL_COMMUNITY)
Admission: RE | Admit: 2019-12-28 | Discharge: 2019-12-28 | Disposition: A | Discharge status: Skilled Nursing Facility | Payer: Medicare Other | Attending: Internal Medicine | Admitting: Internal Medicine

## 2019-12-28 ENCOUNTER — Encounter (HOSPITAL_COMMUNITY)
Admission: RE | Disposition: A | Discharge status: Skilled Nursing Facility | Payer: Self-pay | Source: Home / Self Care | Attending: Internal Medicine

## 2019-12-28 ENCOUNTER — Other Ambulatory Visit: Payer: Self-pay

## 2019-12-28 DIAGNOSIS — I5030 Unspecified diastolic (congestive) heart failure: Secondary | ICD-10-CM | POA: Insufficient documentation

## 2019-12-28 DIAGNOSIS — K219 Gastro-esophageal reflux disease without esophagitis: Secondary | ICD-10-CM | POA: Diagnosis not present

## 2019-12-28 DIAGNOSIS — E782 Mixed hyperlipidemia: Secondary | ICD-10-CM | POA: Insufficient documentation

## 2019-12-28 DIAGNOSIS — I272 Pulmonary hypertension, unspecified: Secondary | ICD-10-CM | POA: Insufficient documentation

## 2019-12-28 DIAGNOSIS — E119 Type 2 diabetes mellitus without complications: Secondary | ICD-10-CM | POA: Insufficient documentation

## 2019-12-28 DIAGNOSIS — Z794 Long term (current) use of insulin: Secondary | ICD-10-CM | POA: Insufficient documentation

## 2019-12-28 DIAGNOSIS — Z87891 Personal history of nicotine dependence: Secondary | ICD-10-CM | POA: Diagnosis not present

## 2019-12-28 DIAGNOSIS — I442 Atrioventricular block, complete: Secondary | ICD-10-CM | POA: Insufficient documentation

## 2019-12-28 DIAGNOSIS — D696 Thrombocytopenia, unspecified: Secondary | ICD-10-CM | POA: Insufficient documentation

## 2019-12-28 DIAGNOSIS — Z7982 Long term (current) use of aspirin: Secondary | ICD-10-CM | POA: Diagnosis not present

## 2019-12-28 DIAGNOSIS — I872 Venous insufficiency (chronic) (peripheral): Secondary | ICD-10-CM | POA: Insufficient documentation

## 2019-12-28 DIAGNOSIS — G4730 Sleep apnea, unspecified: Secondary | ICD-10-CM | POA: Diagnosis not present

## 2019-12-28 DIAGNOSIS — Z45010 Encounter for checking and testing of cardiac pacemaker pulse generator [battery]: Secondary | ICD-10-CM | POA: Diagnosis not present

## 2019-12-28 DIAGNOSIS — Z79899 Other long term (current) drug therapy: Secondary | ICD-10-CM | POA: Insufficient documentation

## 2019-12-28 DIAGNOSIS — Z880 Allergy status to penicillin: Secondary | ICD-10-CM | POA: Diagnosis not present

## 2019-12-28 DIAGNOSIS — I495 Sick sinus syndrome: Secondary | ICD-10-CM | POA: Diagnosis not present

## 2019-12-28 DIAGNOSIS — I482 Chronic atrial fibrillation, unspecified: Secondary | ICD-10-CM | POA: Insufficient documentation

## 2019-12-28 DIAGNOSIS — J449 Chronic obstructive pulmonary disease, unspecified: Secondary | ICD-10-CM | POA: Diagnosis not present

## 2019-12-28 HISTORY — PX: PPM GENERATOR CHANGEOUT: EP1233

## 2019-12-28 LAB — GLUCOSE, CAPILLARY: Glucose-Capillary: 90 mg/dL (ref 70–99)

## 2019-12-28 SURGERY — PPM GENERATOR CHANGEOUT

## 2019-12-28 MED ORDER — LIDOCAINE HCL 1 % IJ SOLN
INTRAMUSCULAR | Status: AC
  Filled 2019-12-28: qty 60

## 2019-12-28 MED ORDER — VANCOMYCIN HCL IN DEXTROSE 1-5 GM/200ML-% IV SOLN
1000.0000 mg | INTRAVENOUS | Status: DC
  Administered 2019-12-28: 1000 mg via INTRAVENOUS
  Filled 2019-12-28: qty 200

## 2019-12-28 MED ORDER — LIDOCAINE HCL (PF) 1 % IJ SOLN
INTRAMUSCULAR | Status: DC | PRN
  Administered 2019-12-28: 50 mL

## 2019-12-28 MED ORDER — CHLORHEXIDINE GLUCONATE 4 % EX LIQD
4.0000 "application " | Freq: Once | CUTANEOUS | Status: AC
  Administered 2019-12-28: 4 via TOPICAL
  Filled 2019-12-28: qty 60

## 2019-12-28 MED ORDER — SODIUM CHLORIDE 0.9 % IV SOLN: INTRAVENOUS | Status: DC

## 2019-12-28 MED ORDER — SODIUM CHLORIDE 0.9 % IV SOLN
INTRAVENOUS | Status: AC
  Filled 2019-12-28: qty 2

## 2019-12-28 MED ORDER — ACETAMINOPHEN 325 MG PO TABS: 325.0000 mg | ORAL_TABLET | ORAL | Status: DC | PRN

## 2019-12-28 MED ORDER — VANCOMYCIN HCL IN DEXTROSE 1-5 GM/200ML-% IV SOLN
INTRAVENOUS | Status: AC
  Filled 2019-12-28: qty 200

## 2019-12-28 MED ORDER — ONDANSETRON HCL 4 MG/2ML IJ SOLN: 4.0000 mg | Freq: Four times a day (QID) | INTRAMUSCULAR | Status: DC | PRN

## 2019-12-28 MED ORDER — SODIUM CHLORIDE 0.9 % IV SOLN
80.0000 mg | INTRAVENOUS | Status: AC
  Administered 2019-12-28: 80 mg
  Filled 2019-12-28: qty 2

## 2019-12-28 SURGICAL SUPPLY — 5 items
CABLE SURGICAL S-101-97-12 (CABLE) ×3 IMPLANT
IPG PACE AZUR XT DR MRI W1DR01 (Pacemaker) ×1 IMPLANT
PACE AZURE XT DR MRI W1DR01 (Pacemaker) ×3 IMPLANT
PAD PRO RADIOLUCENT 2001M-C (PAD) ×3 IMPLANT
TRAY PACEMAKER INSERTION (PACKS) ×3 IMPLANT

## 2019-12-28 NOTE — Discharge Instructions (Signed)
Pacemaker Battery Change, Care After This sheet gives you information about how to care for yourself after your procedure. Your health care provider may also give you more specific instructions. If you have problems or questions, contact your health care provider. What can I expect after the procedure? After your procedure, it is common to have:  Pain or soreness at the site where the pacemaker was inserted.  Swelling at the site where the pacemaker was inserted. Follow these instructions at home: Incision care   Keep the incision clean and dry. ? Do not take baths, swim, or use a hot tub until your health care provider approves. ? You may shower the day after your procedure, or as directed by your health care provider. ? Pat the area dry with a clean towel. Do not rub the area. This may cause bleeding.  Follow instructions from your health care provider about how to take care of your incision. Make sure you: ? Wash your hands with soap and water before you change your bandage (dressing). If soap and water are not available, use hand sanitizer. ? Change your dressing as told by your health care provider. ? Leave stitches (sutures), skin glue, or adhesive strips in place. These skin closures may need to stay in place for 2 weeks or longer. If adhesive strip edges start to loosen and curl up, you may trim the loose edges. Do not remove adhesive strips completely unless your health care provider tells you to do that.  Check your incision area every day for signs of infection. Check for: ? More redness, swelling, or pain. ? More fluid or blood. ? Warmth. ? Pus or a bad smell. Activity  Do not lift anything that is heavier than 10 lb (4.5 kg) until your health care provider says it is okay to do so.  For the first 2 weeks, or as long as told by your health care provider: ? Avoid lifting your left arm higher than your shoulder. ? Be gentle when you move your arms over your head. It is okay  to raise your arm to comb your hair. ? Avoid strenuous exercise.  Ask your health care provider when it is okay to: ? Resume your normal activities. ? Return to work or school. ? Resume sexual activity. Eating and drinking  Eat a heart-healthy diet. This should include plenty of fresh fruits and vegetables, whole grains, low-fat dairy products, and lean protein like chicken and fish.  Limit alcohol intake to no more than 1 drink a day for non-pregnant women and 2 drinks a day for men. One drink equals 12 oz of beer, 5 oz of wine, or 1 oz of hard liquor.  Check ingredients and nutrition facts on packaged foods and beverages. Avoid the following types of food: ? Food that is high in salt (sodium). ? Food that is high in saturated fat, like full-fat dairy or red meat. ? Food that is high in trans fat, like fried food. ? Food and drinks that are high in sugar. Lifestyle  Do not use any products that contain nicotine or tobacco, such as cigarettes and e-cigarettes. If you need help quitting, ask your health care provider.  Take steps to manage and control your weight.  Get regular exercise. Aim for 150 minutes of moderate-intensity exercise (such as walking or yoga) or 75 minutes of vigorous exercise (such as running or swimming) each week.  Manage other health problems, such as diabetes or high blood pressure. Ask your health   care provider how you can manage these conditions. General instructions  Do not drive for 24 hours after your procedure if you were given a medicine to help you relax (sedative).  Take over-the-counter and prescription medicines only as told by your health care provider.  Avoid putting pressure on the area where the pacemaker was placed.  If you need an MRI after your pacemaker has been placed, be sure to tell the health care provider who orders the MRI that you have a pacemaker.  Avoid close and prolonged exposure to electrical devices that have strong  magnetic fields. These include: ? Cell phones. Avoid keeping them in a pocket near the pacemaker, and try using the ear opposite the pacemaker. ? MP3 players. ? Household appliances, like microwaves. ? Metal detectors. ? Electric generators. ? High-tension wires.  Keep all follow-up visits as directed by your health care provider. This is important. Contact a health care provider if:  You have pain at the incision site that is not relieved by over-the-counter or prescription medicines.  You have any of these around your incision site or coming from it: ? More redness, swelling, or pain. ? Fluid or blood. ? Warmth to the touch. ? Pus or a bad smell.  You have a fever.  You feel brief, occasional palpitations, light-headedness, or any symptoms that you think might be related to your heart. Get help right away if:  You experience chest pain that is different from the pain at the pacemaker site.  You develop a red streak that extends above or below the incision site.  You experience shortness of breath.  You have palpitations or an irregular heartbeat.  You have light-headedness that does not go away quickly.  You faint or have dizzy spells.  Your pulse suddenly drops or increases rapidly and does not return to normal.  You begin to gain weight and your legs and ankles swell. Summary  After your procedure, it is common to have pain, soreness, and some swelling where the pacemaker was inserted.  Make sure to keep your incision clean and dry. Follow instructions from your health care provider about how to take care of your incision.  Check your incision every day for signs of infection, such as more pain or swelling, pus or a bad smell, warmth, or leaking fluid and blood.  Avoid strenuous exercise and lifting your left arm higher than your shoulder for 2 weeks, or as long as told by your health care provider. This information is not intended to replace advice given to you by  your health care provider. Make sure you discuss any questions you have with your health care provider. Document Revised: 09/16/2017 Document Reviewed: 08/26/2016 Elsevier Patient Education  2020 [employer]  

## 2019-12-28 NOTE — Interval H&P Note (Signed)
History and Physical Interval Note:  12/28/2019 8:58 AM  Alan Briggs  has presented today for surgery, with the diagnosis of eri.  The various methods of treatment have been discussed with the patient and family. After consideration of risks, benefits and other options for treatment, the patient has consented to  Procedure(s): PPM GENERATOR CHANGEOUT (N/A) as a surgical intervention.  The patient's history has been reviewed, patient examined, no change in status, stable for surgery.  I have reviewed the patient's chart and labs.  Questions were answered to the patient's satisfaction.     Lewayne Bunting

## 2019-12-31 ENCOUNTER — Telehealth: Payer: Self-pay

## 2019-12-31 NOTE — Telephone Encounter (Signed)
Patient is scheduled for wound check on 01/08/20 via virtual visit. F/U with Dr. Ladona Ridgel on 04/16/20.

## 2019-12-31 NOTE — Telephone Encounter (Signed)
[facility] [phone]  Kathie Rhodes states Pt has recent procedure and would like to know when Pt's next follow up.  Please call [phone]  Thanks renee

## 2019-12-31 NOTE — Telephone Encounter (Signed)
Needs to be scheduled for f/u after gen change

## 2020-01-07 DIAGNOSIS — J449 Chronic obstructive pulmonary disease, unspecified: Secondary | ICD-10-CM | POA: Diagnosis not present

## 2020-01-07 DIAGNOSIS — E118 Type 2 diabetes mellitus with unspecified complications: Secondary | ICD-10-CM | POA: Diagnosis not present

## 2020-01-07 DIAGNOSIS — I10 Essential (primary) hypertension: Secondary | ICD-10-CM | POA: Diagnosis not present

## 2020-01-08 ENCOUNTER — Telehealth: Payer: Self-pay

## 2020-01-08 ENCOUNTER — Telehealth: Payer: Self-pay | Admitting: Emergency Medicine

## 2020-01-08 ENCOUNTER — Ambulatory Visit (INDEPENDENT_AMBULATORY_CARE_PROVIDER_SITE_OTHER): Payer: Medicare Other | Admitting: *Deleted

## 2020-01-08 DIAGNOSIS — I10 Essential (primary) hypertension: Secondary | ICD-10-CM | POA: Diagnosis not present

## 2020-01-08 DIAGNOSIS — E118 Type 2 diabetes mellitus with unspecified complications: Secondary | ICD-10-CM | POA: Diagnosis not present

## 2020-01-08 DIAGNOSIS — J449 Chronic obstructive pulmonary disease, unspecified: Secondary | ICD-10-CM | POA: Diagnosis not present

## 2020-01-08 DIAGNOSIS — I495 Sick sinus syndrome: Secondary | ICD-10-CM

## 2020-01-08 NOTE — Telephone Encounter (Signed)
See other phone note

## 2020-01-08 NOTE — Telephone Encounter (Signed)
LMOM to call office. Need to have son remove steri-strips from patients wound site by 4 pm and sen a remote transmission.

## 2020-01-08 NOTE — Telephone Encounter (Signed)
The pt son states the pt is in a nursing home and provided me with two phone numbers to call. He states it will be pointless to call him.   I called the [facility] center and was put on hold for a long period of time then someone hung up the phone.   The phone numbers are [phone] and [phone].  The nurse needs the steri strips off the wound at the appointment time. I left a message with the receptionist.

## 2020-01-08 NOTE — Telephone Encounter (Signed)
[facility] for virtual visit and directed to voice mail. LMOM with device clinic # for sstaff to return call to complete virtual visit.

## 2020-01-09 ENCOUNTER — Other Ambulatory Visit: Payer: Self-pay

## 2020-01-09 NOTE — Progress Notes (Signed)
Video wound check done with assistance of facility staff. Steri-strips removed by facilty wound nurse prior to visit. Wound site with bruising , no visible edema present at wound site. No redness, drainage or signs of infection visible. Wound edges approximatedFindings confirmed by Kathie Rhodes( facility staff). Remote monitor at bedside and remote transmission sent.

## 2020-01-16 DIAGNOSIS — E118 Type 2 diabetes mellitus with unspecified complications: Secondary | ICD-10-CM | POA: Diagnosis not present

## 2020-01-16 DIAGNOSIS — J449 Chronic obstructive pulmonary disease, unspecified: Secondary | ICD-10-CM | POA: Diagnosis not present

## 2020-02-11 DIAGNOSIS — I5032 Chronic diastolic (congestive) heart failure: Secondary | ICD-10-CM | POA: Diagnosis not present

## 2020-02-11 DIAGNOSIS — E118 Type 2 diabetes mellitus with unspecified complications: Secondary | ICD-10-CM | POA: Diagnosis not present

## 2020-02-11 DIAGNOSIS — J449 Chronic obstructive pulmonary disease, unspecified: Secondary | ICD-10-CM | POA: Diagnosis not present

## 2020-02-11 DIAGNOSIS — I10 Essential (primary) hypertension: Secondary | ICD-10-CM | POA: Diagnosis not present

## 2020-02-15 DIAGNOSIS — I10 Essential (primary) hypertension: Secondary | ICD-10-CM | POA: Diagnosis not present

## 2020-02-15 DIAGNOSIS — E118 Type 2 diabetes mellitus with unspecified complications: Secondary | ICD-10-CM | POA: Diagnosis not present

## 2020-02-15 DIAGNOSIS — I5032 Chronic diastolic (congestive) heart failure: Secondary | ICD-10-CM | POA: Diagnosis not present

## 2020-02-15 DIAGNOSIS — R609 Edema, unspecified: Secondary | ICD-10-CM | POA: Diagnosis not present

## 2020-03-13 DIAGNOSIS — I5032 Chronic diastolic (congestive) heart failure: Secondary | ICD-10-CM | POA: Diagnosis not present

## 2020-03-13 DIAGNOSIS — F329 Major depressive disorder, single episode, unspecified: Secondary | ICD-10-CM | POA: Diagnosis not present

## 2020-03-13 DIAGNOSIS — E118 Type 2 diabetes mellitus with unspecified complications: Secondary | ICD-10-CM | POA: Diagnosis not present

## 2020-03-13 DIAGNOSIS — I10 Essential (primary) hypertension: Secondary | ICD-10-CM | POA: Diagnosis not present

## 2020-03-14 DIAGNOSIS — I10 Essential (primary) hypertension: Secondary | ICD-10-CM | POA: Diagnosis not present

## 2020-03-14 DIAGNOSIS — K5900 Constipation, unspecified: Secondary | ICD-10-CM | POA: Diagnosis not present

## 2020-03-14 DIAGNOSIS — E118 Type 2 diabetes mellitus with unspecified complications: Secondary | ICD-10-CM | POA: Diagnosis not present

## 2020-03-19 DIAGNOSIS — I10 Essential (primary) hypertension: Secondary | ICD-10-CM | POA: Diagnosis not present

## 2020-03-19 DIAGNOSIS — D649 Anemia, unspecified: Secondary | ICD-10-CM | POA: Diagnosis not present

## 2020-03-19 DIAGNOSIS — E119 Type 2 diabetes mellitus without complications: Secondary | ICD-10-CM | POA: Diagnosis not present

## 2020-04-09 ENCOUNTER — Ambulatory Visit (INDEPENDENT_AMBULATORY_CARE_PROVIDER_SITE_OTHER): Payer: Medicare Other | Admitting: *Deleted

## 2020-04-09 DIAGNOSIS — I495 Sick sinus syndrome: Secondary | ICD-10-CM | POA: Diagnosis not present

## 2020-04-09 LAB — CUP PACEART REMOTE DEVICE CHECK
Battery Remaining Longevity: 147 mo
Battery Voltage: 3.2 V
Brady Statistic AP VP Percent: 82.72 %
Brady Statistic AP VS Percent: 0 %
Brady Statistic AS VP Percent: 17.23 %
Brady Statistic AS VS Percent: 0.05 %
Brady Statistic RA Percent Paced: 82.62 %
Brady Statistic RV Percent Paced: 99.95 %
Date Time Interrogation Session: 20210623013425
Implantable Lead Implant Date: 19960418
Implantable Lead Implant Date: 19960418
Implantable Lead Location: 753859
Implantable Lead Location: 753860
Implantable Lead Model: 5034
Implantable Lead Model: 5534
Implantable Pulse Generator Implant Date: 20210312
Lead Channel Impedance Value: 665 Ohm
Lead Channel Impedance Value: 760 Ohm
Lead Channel Impedance Value: 779 Ohm
Lead Channel Impedance Value: 798 Ohm
Lead Channel Pacing Threshold Amplitude: 0.625 V
Lead Channel Pacing Threshold Amplitude: 1.125 V
Lead Channel Pacing Threshold Pulse Width: 0.4 ms
Lead Channel Pacing Threshold Pulse Width: 0.4 ms
Lead Channel Sensing Intrinsic Amplitude: 1.125 mV
Lead Channel Sensing Intrinsic Amplitude: 1.125 mV
Lead Channel Setting Pacing Amplitude: 1.5 V
Lead Channel Setting Pacing Amplitude: 2.5 V
Lead Channel Setting Pacing Pulse Width: 0.4 ms
Lead Channel Setting Sensing Sensitivity: 0.9 mV

## 2020-04-10 NOTE — Progress Notes (Signed)
Remote pacemaker transmission.   

## 2020-04-16 ENCOUNTER — Other Ambulatory Visit: Payer: Self-pay

## 2020-04-16 ENCOUNTER — Ambulatory Visit (INDEPENDENT_AMBULATORY_CARE_PROVIDER_SITE_OTHER): Payer: Medicare Other | Admitting: Internal Medicine

## 2020-04-16 VITALS — BP 104/60 | HR 60 | Ht 72.0 in | Wt 268.0 lb

## 2020-04-16 DIAGNOSIS — I10 Essential (primary) hypertension: Secondary | ICD-10-CM

## 2020-04-16 NOTE — Progress Notes (Signed)
HPI [name] returns today for followup. He is a pleasant 86 yo man with a h/o CHB, s/p PPM insertion, and pulmonary HTN. He resides in a SNF. He underwent PPM generator change out 3 months ago. He had no trouble healing. He has not had syncope nor has he fallen. He recovered from Covid 19 several months ago.  Allergies  Allergen Reactions  . Advair Diskus [Fluticasone-Salmeterol] Other (See Comments)    Reaction:  Unknown   . Combivent [Ipratropium-Albuterol] Other (See Comments)    Reaction:  Unknown   . Lotensin Hct [Benazepril-Hydrochlorothiazide] Other (See Comments)    Reaction:  Unknown   . Penicillins Swelling and Other (See Comments)    Reaction:  Facial swelling Has patient had a PCN reaction causing immediate rash, facial/tongue/throat swelling, SOB or lightheadedness with hypotension: Yes Has patient had a PCN reaction causing severe rash involving mucus membranes or skin necrosis: No Has patient had a PCN reaction that required hospitalization No Has patient had a PCN reaction occurring within the last 10 years: No If all of the above answers are "NO", then may proceed with Cephalosporin use.  . Simvastatin Other (See Comments)    Reaction:  Muscle pain      Current Outpatient Medications  Medication Sig Dispense Refill  . acetaminophen (TYLENOL) 325 MG tablet Take 650 mg by mouth every 4 (four) hours as needed for mild pain or moderate pain.     Marland Kitchen amLODipine (NORVASC) 5 MG tablet Take 5 mg by mouth daily.    Marland Kitchen aspirin EC 81 MG tablet Take 81 mg by mouth daily.    Marland Kitchen atenolol (TENORMIN) 25 MG tablet Take 1 tablet (25 mg total) by mouth daily. 90 tablet 3  . furosemide (LASIX) 20 MG tablet Take 20 mg by mouth daily.    Marland Kitchen gabapentin (NEURONTIN) 100 MG capsule Take 100 mg by mouth 3 (three) times daily.     . insulin aspart (NOVOLOG FLEXPEN) 100 UNIT/ML FlexPen Inject into the skin 3 (three) times daily with meals.    . insulin detemir (LEVEMIR) 100 UNIT/ML injection  Inject 26 Units into the skin at bedtime.     . insulin glargine (LANTUS) 100 UNIT/ML injection Inject 0.1 mLs (10 Units total) into the skin at bedtime. 10 mL 11  . meclizine (ANTIVERT) 25 MG tablet Take 1 tablet (25 mg total) by mouth every 8 (eight) hours as needed for dizziness. (Patient taking differently: Take 25 mg by mouth daily. Additional 25 mg if needed for vertigo every 5 hours) 90 tablet 1  . multivitamin-lutein (OCUVITE-LUTEIN) CAPS capsule Take 1 capsule by mouth daily.    Marland Kitchen NOVOLOG FLEXPEN 100 UNIT/ML FlexPen Inject 10 Units into the skin every morning.     . polyethylene glycol (MIRALAX / GLYCOLAX) 17 g packet Take 17 g by mouth daily.    . potassium chloride 20 MEQ/15ML (10%) SOLN Take 20 mEq by mouth daily.     . sertraline (ZOLOFT) 25 MG tablet Take 25 mg by mouth daily.    . sertraline (ZOLOFT) 50 MG tablet Take 50 mg by mouth daily.     No current facility-administered medications for this visit.     Past Medical History:  Diagnosis Date  . Cardiac pacemaker in situ    for sick sinus syndrome-last placement was 09/2007 [facility]  . Diabetes [place])   . GERD (gastroesophageal reflux disease)   . Mixed hyperlipidemia   . Obstructive lung disease (HCC)  non compliant with home O2  . Pulmonary hypertension (HCC)   . Sleep apnea   . Thrombocytopenia (HCC)   . Venous insufficiency (chronic) (peripheral)     ROS:   All systems reviewed and negative except as noted in the HPI.   Past Surgical History:  Procedure Laterality Date  . CATARACT EXTRACTION, BILATERAL Bilateral   . COLONOSCOPY N/A 04/24/2017   Procedure: COLONOSCOPY;  Surgeon: Corbin Ade, MD;  Location: [facility];  Service: Endoscopy;  Laterality: N/A;  . ESOPHAGOGASTRODUODENOSCOPY N/A 04/24/2017   Procedure: ESOPHAGOGASTRODUODENOSCOPY (EGD);  Surgeon: Corbin Ade, MD;  Location: [facility] SUITE;  Service: Endoscopy;  Laterality: N/A;  . KIDNEY STONE SURGERY Left    laser ablation   .  PACEMAKER IMPLANT    . PERCUTANEOUS PINNING Right 04/20/2016   Procedure: IRRIGATION AND DEBRIDEMENT RIGHT FOOT, PERCUTANEOUS PINNING SMALL TOE;  Surgeon: Tarry Kos, MD;  Location: [facility] OR;  Service: Orthopedics;  Laterality: Right;  . PPM GENERATOR CHANGEOUT N/A 12/28/2019   Procedure: PPM GENERATOR CHANGEOUT;  Surgeon: Marinus Maw, MD;  Location: [facility] INVASIVE CV LAB;  Service: Cardiovascular;  Laterality: N/A;  . REFRACTIVE SURGERY Right   . ROTATOR CUFF REPAIR Left      Family History  Problem Relation Age of Onset  . Diabetes Mother   . Colon cancer Neg Hx   . Colon polyps Neg Hx      Social History   Socioeconomic History  . Marital status: Married    Spouse name: Not on file  . Number of children: Not on file  . Years of education: Not on file  . Highest education level: Not on file  Occupational History  . Not on file  Tobacco Use  . Smoking status: Former Smoker    Packs/day: 0.50    Years: 30.00    Pack years: 15.00    Types: Cigarettes    Start date: 10/18/1948    Quit date: 10/18/1978    Years since quitting: 41.5  . Smokeless tobacco: Never Used  Vaping Use  . Vaping Use: Never used  Substance and Sexual Activity  . Alcohol use: No    Alcohol/week: 0.0 standard drinks  . Drug use: No  . Sexual activity: Never  Other Topics Concern  . Not on file  Social History Narrative  . Not on file   Social Determinants of Health   Financial Resource Strain:   . Difficulty of Paying Living Expenses:   Food Insecurity:   . Worried About Programme researcher, broadcasting/film/video in the Last Year:   . Barista in the Last Year:   Transportation Needs:   . Freight forwarder (Medical):   Marland Kitchen Lack of Transportation (Non-Medical):   Physical Activity:   . Days of Exercise per Week:   . Minutes of Exercise per Session:   Stress:   . Feeling of Stress :   Social Connections:   . Frequency of Communication with Friends and Family:   . Frequency of Social Gatherings with  Friends and Family:   . Attends Religious Services:   . Active Member of Clubs or Organizations:   . Attends Banker Meetings:   Marland Kitchen Marital Status:   Intimate Partner Violence:   . Fear of Current or Ex-Partner:   . Emotionally Abused:   Marland Kitchen Physically Abused:   . Sexually Abused:      BP 104/60   Pulse 60   Ht 6' (1.829 m)  Wt 268 lb (121.6 kg)   BMI 36.35 kg/m   Physical Exam:  Well appearing NAD HEENT: Unremarkable Neck:  No JVD, no thyromegally Lymphatics:  No adenopathy Back:  No CVA tenderness Lungs:  Clear with no wheezes HEART:  Regular rate rhythm, no murmurs, no rubs, no clicks Abd:  soft, positive bowel sounds, no organomegally, no rebound, no guarding Ext:  2 plus pulses, no edema, no cyanosis, no clubbing Skin:  No rashes no nodules Neuro:  CN II through XII intact, motor grossly intact  EKG - nsr with AV pacing  DEVICE  Normal device function.  See PaceArt for details.   Assess/Plan: 1. CHB - he is asymptomatic, s/p PPM insertion. 2. PPM - his medtronic DDD PM is working normally. We will recheck in several months.  3. Pulmonary HTN - he has dyspnea with exertion but is fairly sedentary. No change in his meds. 4. Atrial fib - he is maintaining NSR. He is not thought to be a candidate for systemic anti-coagulation.  Wiley Magan,M.D.  Leonia Reeves.D.

## 2020-04-16 NOTE — Patient Instructions (Signed)
Medication Instructions:  Your physician recommends that you continue on your current medications as directed. Please refer to the Current Medication list given to you today.  *If you need a refill on your cardiac medications before your next appointment, please call your pharmacy*   Lab Work: NONE   If you have labs (blood work) drawn today and your tests are completely normal, you will receive your results only by: . MyChart Message (if you have MyChart) OR . A paper copy in the mail If you have any lab test that is abnormal or we need to change your treatment, we will call you to review the results.   Testing/Procedures: NONE    Follow-Up: At [facility], you and your health needs are our priority.  As part of our continuing mission to provide you with exceptional heart care, we have created designated Provider Care Teams.  These Care Teams include your primary Cardiologist (physician) and Advanced Practice Providers (APPs -  Physician Assistants and Nurse Practitioners) who all work together to provide you with the care you need, when you need it.  We recommend signing up for the patient portal called "MyChart".  Sign up information is provided on this After Visit Summary.  MyChart is used to connect with patients for Virtual Visits (Telemedicine).  Patients are able to view lab/test results, encounter notes, upcoming appointments, etc.  Non-urgent messages can be sent to your provider as well.   To learn more about what you can do with MyChart, go to [email or web address].    Your next appointment:   1 year(s)  The format for your next appointment:   In Person  Provider:   Gregg Taylor, MD   Other Instructions Thank you for choosing [facility]!    

## 2020-04-28 DIAGNOSIS — M6281 Muscle weakness (generalized): Secondary | ICD-10-CM | POA: Diagnosis not present

## 2020-04-28 DIAGNOSIS — M48061 Spinal stenosis, lumbar region without neurogenic claudication: Secondary | ICD-10-CM | POA: Diagnosis not present

## 2020-04-28 DIAGNOSIS — R262 Difficulty in walking, not elsewhere classified: Secondary | ICD-10-CM | POA: Diagnosis not present

## 2020-04-29 DIAGNOSIS — R262 Difficulty in walking, not elsewhere classified: Secondary | ICD-10-CM | POA: Diagnosis not present

## 2020-04-29 DIAGNOSIS — M48061 Spinal stenosis, lumbar region without neurogenic claudication: Secondary | ICD-10-CM | POA: Diagnosis not present

## 2020-04-29 DIAGNOSIS — M6281 Muscle weakness (generalized): Secondary | ICD-10-CM | POA: Diagnosis not present

## 2020-04-30 DIAGNOSIS — M6281 Muscle weakness (generalized): Secondary | ICD-10-CM | POA: Diagnosis not present

## 2020-04-30 DIAGNOSIS — R262 Difficulty in walking, not elsewhere classified: Secondary | ICD-10-CM | POA: Diagnosis not present

## 2020-04-30 DIAGNOSIS — M48061 Spinal stenosis, lumbar region without neurogenic claudication: Secondary | ICD-10-CM | POA: Diagnosis not present

## 2020-05-01 DIAGNOSIS — R262 Difficulty in walking, not elsewhere classified: Secondary | ICD-10-CM | POA: Diagnosis not present

## 2020-05-01 DIAGNOSIS — M6281 Muscle weakness (generalized): Secondary | ICD-10-CM | POA: Diagnosis not present

## 2020-05-01 DIAGNOSIS — M48061 Spinal stenosis, lumbar region without neurogenic claudication: Secondary | ICD-10-CM | POA: Diagnosis not present

## 2020-05-02 DIAGNOSIS — R262 Difficulty in walking, not elsewhere classified: Secondary | ICD-10-CM | POA: Diagnosis not present

## 2020-05-02 DIAGNOSIS — M48061 Spinal stenosis, lumbar region without neurogenic claudication: Secondary | ICD-10-CM | POA: Diagnosis not present

## 2020-05-02 DIAGNOSIS — M6281 Muscle weakness (generalized): Secondary | ICD-10-CM | POA: Diagnosis not present

## 2020-05-04 DIAGNOSIS — R262 Difficulty in walking, not elsewhere classified: Secondary | ICD-10-CM | POA: Diagnosis not present

## 2020-05-04 DIAGNOSIS — M6281 Muscle weakness (generalized): Secondary | ICD-10-CM | POA: Diagnosis not present

## 2020-05-04 DIAGNOSIS — M48061 Spinal stenosis, lumbar region without neurogenic claudication: Secondary | ICD-10-CM | POA: Diagnosis not present

## 2020-05-05 DIAGNOSIS — M48061 Spinal stenosis, lumbar region without neurogenic claudication: Secondary | ICD-10-CM | POA: Diagnosis not present

## 2020-05-05 DIAGNOSIS — R262 Difficulty in walking, not elsewhere classified: Secondary | ICD-10-CM | POA: Diagnosis not present

## 2020-05-05 DIAGNOSIS — M6281 Muscle weakness (generalized): Secondary | ICD-10-CM | POA: Diagnosis not present

## 2020-05-06 DIAGNOSIS — M6281 Muscle weakness (generalized): Secondary | ICD-10-CM | POA: Diagnosis not present

## 2020-05-06 DIAGNOSIS — M48061 Spinal stenosis, lumbar region without neurogenic claudication: Secondary | ICD-10-CM | POA: Diagnosis not present

## 2020-05-06 DIAGNOSIS — R262 Difficulty in walking, not elsewhere classified: Secondary | ICD-10-CM | POA: Diagnosis not present

## 2020-05-07 DIAGNOSIS — M6281 Muscle weakness (generalized): Secondary | ICD-10-CM | POA: Diagnosis not present

## 2020-05-07 DIAGNOSIS — M48061 Spinal stenosis, lumbar region without neurogenic claudication: Secondary | ICD-10-CM | POA: Diagnosis not present

## 2020-05-07 DIAGNOSIS — R262 Difficulty in walking, not elsewhere classified: Secondary | ICD-10-CM | POA: Diagnosis not present

## 2020-05-08 DIAGNOSIS — M6281 Muscle weakness (generalized): Secondary | ICD-10-CM | POA: Diagnosis not present

## 2020-05-08 DIAGNOSIS — M48061 Spinal stenosis, lumbar region without neurogenic claudication: Secondary | ICD-10-CM | POA: Diagnosis not present

## 2020-05-08 DIAGNOSIS — R262 Difficulty in walking, not elsewhere classified: Secondary | ICD-10-CM | POA: Diagnosis not present

## 2020-05-09 DIAGNOSIS — R262 Difficulty in walking, not elsewhere classified: Secondary | ICD-10-CM | POA: Diagnosis not present

## 2020-05-09 DIAGNOSIS — J449 Chronic obstructive pulmonary disease, unspecified: Secondary | ICD-10-CM | POA: Diagnosis not present

## 2020-05-09 DIAGNOSIS — E118 Type 2 diabetes mellitus with unspecified complications: Secondary | ICD-10-CM | POA: Diagnosis not present

## 2020-05-09 DIAGNOSIS — M6281 Muscle weakness (generalized): Secondary | ICD-10-CM | POA: Diagnosis not present

## 2020-05-09 DIAGNOSIS — I10 Essential (primary) hypertension: Secondary | ICD-10-CM | POA: Diagnosis not present

## 2020-05-09 DIAGNOSIS — K5900 Constipation, unspecified: Secondary | ICD-10-CM | POA: Diagnosis not present

## 2020-05-09 DIAGNOSIS — M48061 Spinal stenosis, lumbar region without neurogenic claudication: Secondary | ICD-10-CM | POA: Diagnosis not present

## 2020-05-12 DIAGNOSIS — M6281 Muscle weakness (generalized): Secondary | ICD-10-CM | POA: Diagnosis not present

## 2020-05-12 DIAGNOSIS — M48061 Spinal stenosis, lumbar region without neurogenic claudication: Secondary | ICD-10-CM | POA: Diagnosis not present

## 2020-05-12 DIAGNOSIS — R262 Difficulty in walking, not elsewhere classified: Secondary | ICD-10-CM | POA: Diagnosis not present

## 2020-05-13 DIAGNOSIS — M6281 Muscle weakness (generalized): Secondary | ICD-10-CM | POA: Diagnosis not present

## 2020-05-13 DIAGNOSIS — I10 Essential (primary) hypertension: Secondary | ICD-10-CM | POA: Diagnosis not present

## 2020-05-13 DIAGNOSIS — E118 Type 2 diabetes mellitus with unspecified complications: Secondary | ICD-10-CM | POA: Diagnosis not present

## 2020-05-13 DIAGNOSIS — M48061 Spinal stenosis, lumbar region without neurogenic claudication: Secondary | ICD-10-CM | POA: Diagnosis not present

## 2020-05-13 DIAGNOSIS — F329 Major depressive disorder, single episode, unspecified: Secondary | ICD-10-CM | POA: Diagnosis not present

## 2020-05-13 DIAGNOSIS — J449 Chronic obstructive pulmonary disease, unspecified: Secondary | ICD-10-CM | POA: Diagnosis not present

## 2020-05-13 DIAGNOSIS — R262 Difficulty in walking, not elsewhere classified: Secondary | ICD-10-CM | POA: Diagnosis not present

## 2020-05-13 DIAGNOSIS — I5032 Chronic diastolic (congestive) heart failure: Secondary | ICD-10-CM | POA: Diagnosis not present

## 2020-05-14 DIAGNOSIS — M48061 Spinal stenosis, lumbar region without neurogenic claudication: Secondary | ICD-10-CM | POA: Diagnosis not present

## 2020-05-14 DIAGNOSIS — M6281 Muscle weakness (generalized): Secondary | ICD-10-CM | POA: Diagnosis not present

## 2020-05-14 DIAGNOSIS — R262 Difficulty in walking, not elsewhere classified: Secondary | ICD-10-CM | POA: Diagnosis not present

## 2020-05-15 DIAGNOSIS — M48061 Spinal stenosis, lumbar region without neurogenic claudication: Secondary | ICD-10-CM | POA: Diagnosis not present

## 2020-05-15 DIAGNOSIS — M6281 Muscle weakness (generalized): Secondary | ICD-10-CM | POA: Diagnosis not present

## 2020-05-15 DIAGNOSIS — R262 Difficulty in walking, not elsewhere classified: Secondary | ICD-10-CM | POA: Diagnosis not present

## 2020-05-16 DIAGNOSIS — M6281 Muscle weakness (generalized): Secondary | ICD-10-CM | POA: Diagnosis not present

## 2020-05-16 DIAGNOSIS — R262 Difficulty in walking, not elsewhere classified: Secondary | ICD-10-CM | POA: Diagnosis not present

## 2020-05-16 DIAGNOSIS — S5001XA Contusion of right elbow, initial encounter: Secondary | ICD-10-CM | POA: Diagnosis not present

## 2020-05-16 DIAGNOSIS — M48061 Spinal stenosis, lumbar region without neurogenic claudication: Secondary | ICD-10-CM | POA: Diagnosis not present

## 2020-05-19 DIAGNOSIS — M6281 Muscle weakness (generalized): Secondary | ICD-10-CM | POA: Diagnosis not present

## 2020-05-19 DIAGNOSIS — I10 Essential (primary) hypertension: Secondary | ICD-10-CM | POA: Diagnosis not present

## 2020-05-19 DIAGNOSIS — D649 Anemia, unspecified: Secondary | ICD-10-CM | POA: Diagnosis not present

## 2020-05-19 DIAGNOSIS — R262 Difficulty in walking, not elsewhere classified: Secondary | ICD-10-CM | POA: Diagnosis not present

## 2020-05-19 DIAGNOSIS — M48061 Spinal stenosis, lumbar region without neurogenic claudication: Secondary | ICD-10-CM | POA: Diagnosis not present

## 2020-05-20 DIAGNOSIS — M48061 Spinal stenosis, lumbar region without neurogenic claudication: Secondary | ICD-10-CM | POA: Diagnosis not present

## 2020-05-20 DIAGNOSIS — M6281 Muscle weakness (generalized): Secondary | ICD-10-CM | POA: Diagnosis not present

## 2020-05-20 DIAGNOSIS — R262 Difficulty in walking, not elsewhere classified: Secondary | ICD-10-CM | POA: Diagnosis not present

## 2020-05-21 DIAGNOSIS — M48061 Spinal stenosis, lumbar region without neurogenic claudication: Secondary | ICD-10-CM | POA: Diagnosis not present

## 2020-05-21 DIAGNOSIS — R262 Difficulty in walking, not elsewhere classified: Secondary | ICD-10-CM | POA: Diagnosis not present

## 2020-05-21 DIAGNOSIS — M6281 Muscle weakness (generalized): Secondary | ICD-10-CM | POA: Diagnosis not present

## 2020-05-22 DIAGNOSIS — R262 Difficulty in walking, not elsewhere classified: Secondary | ICD-10-CM | POA: Diagnosis not present

## 2020-05-22 DIAGNOSIS — M6281 Muscle weakness (generalized): Secondary | ICD-10-CM | POA: Diagnosis not present

## 2020-05-22 DIAGNOSIS — M48061 Spinal stenosis, lumbar region without neurogenic claudication: Secondary | ICD-10-CM | POA: Diagnosis not present

## 2020-05-23 DIAGNOSIS — M48061 Spinal stenosis, lumbar region without neurogenic claudication: Secondary | ICD-10-CM | POA: Diagnosis not present

## 2020-05-23 DIAGNOSIS — R262 Difficulty in walking, not elsewhere classified: Secondary | ICD-10-CM | POA: Diagnosis not present

## 2020-05-23 DIAGNOSIS — M6281 Muscle weakness (generalized): Secondary | ICD-10-CM | POA: Diagnosis not present

## 2020-05-26 DIAGNOSIS — R262 Difficulty in walking, not elsewhere classified: Secondary | ICD-10-CM | POA: Diagnosis not present

## 2020-05-26 DIAGNOSIS — M6281 Muscle weakness (generalized): Secondary | ICD-10-CM | POA: Diagnosis not present

## 2020-05-26 DIAGNOSIS — M48061 Spinal stenosis, lumbar region without neurogenic claudication: Secondary | ICD-10-CM | POA: Diagnosis not present

## 2020-05-27 DIAGNOSIS — R262 Difficulty in walking, not elsewhere classified: Secondary | ICD-10-CM | POA: Diagnosis not present

## 2020-05-27 DIAGNOSIS — M6281 Muscle weakness (generalized): Secondary | ICD-10-CM | POA: Diagnosis not present

## 2020-05-27 DIAGNOSIS — M48061 Spinal stenosis, lumbar region without neurogenic claudication: Secondary | ICD-10-CM | POA: Diagnosis not present

## 2020-05-28 DIAGNOSIS — M48061 Spinal stenosis, lumbar region without neurogenic claudication: Secondary | ICD-10-CM | POA: Diagnosis not present

## 2020-05-28 DIAGNOSIS — R262 Difficulty in walking, not elsewhere classified: Secondary | ICD-10-CM | POA: Diagnosis not present

## 2020-05-28 DIAGNOSIS — M6281 Muscle weakness (generalized): Secondary | ICD-10-CM | POA: Diagnosis not present

## 2020-05-29 DIAGNOSIS — M48061 Spinal stenosis, lumbar region without neurogenic claudication: Secondary | ICD-10-CM | POA: Diagnosis not present

## 2020-05-29 DIAGNOSIS — E118 Type 2 diabetes mellitus with unspecified complications: Secondary | ICD-10-CM | POA: Diagnosis not present

## 2020-05-29 DIAGNOSIS — I5032 Chronic diastolic (congestive) heart failure: Secondary | ICD-10-CM | POA: Diagnosis not present

## 2020-05-29 DIAGNOSIS — R262 Difficulty in walking, not elsewhere classified: Secondary | ICD-10-CM | POA: Diagnosis not present

## 2020-05-29 DIAGNOSIS — M6281 Muscle weakness (generalized): Secondary | ICD-10-CM | POA: Diagnosis not present

## 2020-05-29 DIAGNOSIS — G4733 Obstructive sleep apnea (adult) (pediatric): Secondary | ICD-10-CM | POA: Diagnosis not present

## 2020-05-29 DIAGNOSIS — I10 Essential (primary) hypertension: Secondary | ICD-10-CM | POA: Diagnosis not present

## 2020-05-29 DIAGNOSIS — J449 Chronic obstructive pulmonary disease, unspecified: Secondary | ICD-10-CM | POA: Diagnosis not present

## 2020-06-06 DIAGNOSIS — I5032 Chronic diastolic (congestive) heart failure: Secondary | ICD-10-CM | POA: Diagnosis not present

## 2020-06-06 DIAGNOSIS — J449 Chronic obstructive pulmonary disease, unspecified: Secondary | ICD-10-CM | POA: Diagnosis not present

## 2020-06-06 DIAGNOSIS — M48061 Spinal stenosis, lumbar region without neurogenic claudication: Secondary | ICD-10-CM | POA: Diagnosis not present

## 2020-06-06 DIAGNOSIS — I10 Essential (primary) hypertension: Secondary | ICD-10-CM | POA: Diagnosis not present

## 2020-06-06 DIAGNOSIS — E118 Type 2 diabetes mellitus with unspecified complications: Secondary | ICD-10-CM | POA: Diagnosis not present

## 2020-06-10 DIAGNOSIS — I10 Essential (primary) hypertension: Secondary | ICD-10-CM | POA: Diagnosis not present

## 2020-06-10 DIAGNOSIS — E118 Type 2 diabetes mellitus with unspecified complications: Secondary | ICD-10-CM | POA: Diagnosis not present

## 2020-06-10 DIAGNOSIS — F329 Major depressive disorder, single episode, unspecified: Secondary | ICD-10-CM | POA: Diagnosis not present

## 2020-06-10 DIAGNOSIS — I5032 Chronic diastolic (congestive) heart failure: Secondary | ICD-10-CM | POA: Diagnosis not present

## 2020-06-10 DIAGNOSIS — J449 Chronic obstructive pulmonary disease, unspecified: Secondary | ICD-10-CM | POA: Diagnosis not present

## 2020-06-19 DIAGNOSIS — R262 Difficulty in walking, not elsewhere classified: Secondary | ICD-10-CM | POA: Diagnosis not present

## 2020-06-19 DIAGNOSIS — M48061 Spinal stenosis, lumbar region without neurogenic claudication: Secondary | ICD-10-CM | POA: Diagnosis not present

## 2020-06-19 DIAGNOSIS — M6281 Muscle weakness (generalized): Secondary | ICD-10-CM | POA: Diagnosis not present

## 2020-06-20 DIAGNOSIS — E119 Type 2 diabetes mellitus without complications: Secondary | ICD-10-CM | POA: Diagnosis not present

## 2020-07-04 DIAGNOSIS — I10 Essential (primary) hypertension: Secondary | ICD-10-CM | POA: Diagnosis not present

## 2020-07-04 DIAGNOSIS — I5032 Chronic diastolic (congestive) heart failure: Secondary | ICD-10-CM | POA: Diagnosis not present

## 2020-07-04 DIAGNOSIS — E118 Type 2 diabetes mellitus with unspecified complications: Secondary | ICD-10-CM | POA: Diagnosis not present

## 2020-07-04 DIAGNOSIS — M48061 Spinal stenosis, lumbar region without neurogenic claudication: Secondary | ICD-10-CM | POA: Diagnosis not present

## 2020-07-04 DIAGNOSIS — J449 Chronic obstructive pulmonary disease, unspecified: Secondary | ICD-10-CM | POA: Diagnosis not present

## 2020-07-07 DIAGNOSIS — F329 Major depressive disorder, single episode, unspecified: Secondary | ICD-10-CM | POA: Diagnosis not present

## 2020-07-07 DIAGNOSIS — E118 Type 2 diabetes mellitus with unspecified complications: Secondary | ICD-10-CM | POA: Diagnosis not present

## 2020-07-07 DIAGNOSIS — J449 Chronic obstructive pulmonary disease, unspecified: Secondary | ICD-10-CM | POA: Diagnosis not present

## 2020-07-07 DIAGNOSIS — I10 Essential (primary) hypertension: Secondary | ICD-10-CM | POA: Diagnosis not present

## 2020-07-07 DIAGNOSIS — I5032 Chronic diastolic (congestive) heart failure: Secondary | ICD-10-CM | POA: Diagnosis not present

## 2020-07-09 ENCOUNTER — Ambulatory Visit (INDEPENDENT_AMBULATORY_CARE_PROVIDER_SITE_OTHER): Payer: Medicare Other | Admitting: *Deleted

## 2020-07-09 DIAGNOSIS — I495 Sick sinus syndrome: Secondary | ICD-10-CM

## 2020-07-09 LAB — CUP PACEART REMOTE DEVICE CHECK
Battery Remaining Longevity: 137 mo
Battery Voltage: 3.16 V
Brady Statistic AP VP Percent: 83.29 %
Brady Statistic AP VS Percent: 0 %
Brady Statistic AS VP Percent: 16.65 %
Brady Statistic AS VS Percent: 0.06 %
Brady Statistic RA Percent Paced: 83.19 %
Brady Statistic RV Percent Paced: 99.94 %
Date Time Interrogation Session: 20210922032902
Implantable Lead Implant Date: 19960418
Implantable Lead Implant Date: 19960418
Implantable Lead Location: 753859
Implantable Lead Location: 753860
Implantable Lead Model: 5034
Implantable Lead Model: 5534
Implantable Pulse Generator Implant Date: 20210312
Lead Channel Impedance Value: 646 Ohm
Lead Channel Impedance Value: 741 Ohm
Lead Channel Impedance Value: 779 Ohm
Lead Channel Impedance Value: 798 Ohm
Lead Channel Pacing Threshold Amplitude: 0.625 V
Lead Channel Pacing Threshold Amplitude: 1.125 V
Lead Channel Pacing Threshold Pulse Width: 0.4 ms
Lead Channel Pacing Threshold Pulse Width: 0.4 ms
Lead Channel Sensing Intrinsic Amplitude: 1.25 mV
Lead Channel Sensing Intrinsic Amplitude: 1.25 mV
Lead Channel Setting Pacing Amplitude: 1.5 V
Lead Channel Setting Pacing Amplitude: 2.75 V
Lead Channel Setting Pacing Pulse Width: 0.4 ms
Lead Channel Setting Sensing Sensitivity: 0.9 mV

## 2020-07-11 NOTE — Progress Notes (Signed)
Remote pacemaker transmission.   

## 2020-07-14 DIAGNOSIS — R300 Dysuria: Secondary | ICD-10-CM | POA: Diagnosis not present

## 2020-07-18 DIAGNOSIS — M48061 Spinal stenosis, lumbar region without neurogenic claudication: Secondary | ICD-10-CM | POA: Diagnosis not present

## 2020-07-18 DIAGNOSIS — J449 Chronic obstructive pulmonary disease, unspecified: Secondary | ICD-10-CM | POA: Diagnosis not present

## 2020-07-18 DIAGNOSIS — I482 Chronic atrial fibrillation, unspecified: Secondary | ICD-10-CM | POA: Diagnosis not present

## 2020-07-18 DIAGNOSIS — E119 Type 2 diabetes mellitus without complications: Secondary | ICD-10-CM | POA: Diagnosis not present

## 2020-07-18 DIAGNOSIS — M6281 Muscle weakness (generalized): Secondary | ICD-10-CM | POA: Diagnosis not present

## 2020-07-18 DIAGNOSIS — N179 Acute kidney failure, unspecified: Secondary | ICD-10-CM | POA: Diagnosis not present

## 2020-07-18 DIAGNOSIS — N390 Urinary tract infection, site not specified: Secondary | ICD-10-CM | POA: Diagnosis not present

## 2020-07-18 DIAGNOSIS — D500 Iron deficiency anemia secondary to blood loss (chronic): Secondary | ICD-10-CM | POA: Diagnosis not present

## 2020-07-25 DIAGNOSIS — N390 Urinary tract infection, site not specified: Secondary | ICD-10-CM | POA: Diagnosis not present

## 2020-07-25 DIAGNOSIS — M25619 Stiffness of unspecified shoulder, not elsewhere classified: Secondary | ICD-10-CM | POA: Diagnosis not present

## 2020-08-04 DIAGNOSIS — M48061 Spinal stenosis, lumbar region without neurogenic claudication: Secondary | ICD-10-CM | POA: Diagnosis not present

## 2020-08-04 DIAGNOSIS — M6281 Muscle weakness (generalized): Secondary | ICD-10-CM | POA: Diagnosis not present

## 2020-08-05 DIAGNOSIS — M6281 Muscle weakness (generalized): Secondary | ICD-10-CM | POA: Diagnosis not present

## 2020-08-05 DIAGNOSIS — M48061 Spinal stenosis, lumbar region without neurogenic claudication: Secondary | ICD-10-CM | POA: Diagnosis not present

## 2020-08-06 DIAGNOSIS — M6281 Muscle weakness (generalized): Secondary | ICD-10-CM | POA: Diagnosis not present

## 2020-08-06 DIAGNOSIS — M48061 Spinal stenosis, lumbar region without neurogenic claudication: Secondary | ICD-10-CM | POA: Diagnosis not present

## 2020-08-07 DIAGNOSIS — M6281 Muscle weakness (generalized): Secondary | ICD-10-CM | POA: Diagnosis not present

## 2020-08-07 DIAGNOSIS — M48061 Spinal stenosis, lumbar region without neurogenic claudication: Secondary | ICD-10-CM | POA: Diagnosis not present

## 2020-08-08 DIAGNOSIS — M6281 Muscle weakness (generalized): Secondary | ICD-10-CM | POA: Diagnosis not present

## 2020-08-08 DIAGNOSIS — M48061 Spinal stenosis, lumbar region without neurogenic claudication: Secondary | ICD-10-CM | POA: Diagnosis not present

## 2020-08-11 DIAGNOSIS — M6281 Muscle weakness (generalized): Secondary | ICD-10-CM | POA: Diagnosis not present

## 2020-08-11 DIAGNOSIS — M48061 Spinal stenosis, lumbar region without neurogenic claudication: Secondary | ICD-10-CM | POA: Diagnosis not present

## 2020-08-12 DIAGNOSIS — M48061 Spinal stenosis, lumbar region without neurogenic claudication: Secondary | ICD-10-CM | POA: Diagnosis not present

## 2020-08-12 DIAGNOSIS — M6281 Muscle weakness (generalized): Secondary | ICD-10-CM | POA: Diagnosis not present

## 2020-08-13 DIAGNOSIS — M48061 Spinal stenosis, lumbar region without neurogenic claudication: Secondary | ICD-10-CM | POA: Diagnosis not present

## 2020-08-13 DIAGNOSIS — M6281 Muscle weakness (generalized): Secondary | ICD-10-CM | POA: Diagnosis not present

## 2020-08-14 DIAGNOSIS — M48061 Spinal stenosis, lumbar region without neurogenic claudication: Secondary | ICD-10-CM | POA: Diagnosis not present

## 2020-08-14 DIAGNOSIS — M6281 Muscle weakness (generalized): Secondary | ICD-10-CM | POA: Diagnosis not present

## 2020-08-15 DIAGNOSIS — M48061 Spinal stenosis, lumbar region without neurogenic claudication: Secondary | ICD-10-CM | POA: Diagnosis not present

## 2020-08-15 DIAGNOSIS — M6281 Muscle weakness (generalized): Secondary | ICD-10-CM | POA: Diagnosis not present

## 2020-08-18 DIAGNOSIS — M48061 Spinal stenosis, lumbar region without neurogenic claudication: Secondary | ICD-10-CM | POA: Diagnosis not present

## 2020-08-18 DIAGNOSIS — M6281 Muscle weakness (generalized): Secondary | ICD-10-CM | POA: Diagnosis not present

## 2020-08-19 DIAGNOSIS — M6281 Muscle weakness (generalized): Secondary | ICD-10-CM | POA: Diagnosis not present

## 2020-08-19 DIAGNOSIS — M48061 Spinal stenosis, lumbar region without neurogenic claudication: Secondary | ICD-10-CM | POA: Diagnosis not present

## 2020-08-20 DIAGNOSIS — M6281 Muscle weakness (generalized): Secondary | ICD-10-CM | POA: Diagnosis not present

## 2020-08-20 DIAGNOSIS — Z23 Encounter for immunization: Secondary | ICD-10-CM | POA: Diagnosis not present

## 2020-08-20 DIAGNOSIS — M48061 Spinal stenosis, lumbar region without neurogenic claudication: Secondary | ICD-10-CM | POA: Diagnosis not present

## 2020-08-21 DIAGNOSIS — M6281 Muscle weakness (generalized): Secondary | ICD-10-CM | POA: Diagnosis not present

## 2020-08-21 DIAGNOSIS — M48061 Spinal stenosis, lumbar region without neurogenic claudication: Secondary | ICD-10-CM | POA: Diagnosis not present

## 2020-08-22 DIAGNOSIS — M6281 Muscle weakness (generalized): Secondary | ICD-10-CM | POA: Diagnosis not present

## 2020-08-22 DIAGNOSIS — M48061 Spinal stenosis, lumbar region without neurogenic claudication: Secondary | ICD-10-CM | POA: Diagnosis not present

## 2020-08-25 DIAGNOSIS — M6281 Muscle weakness (generalized): Secondary | ICD-10-CM | POA: Diagnosis not present

## 2020-08-25 DIAGNOSIS — M48061 Spinal stenosis, lumbar region without neurogenic claudication: Secondary | ICD-10-CM | POA: Diagnosis not present

## 2020-08-26 DIAGNOSIS — M6281 Muscle weakness (generalized): Secondary | ICD-10-CM | POA: Diagnosis not present

## 2020-08-26 DIAGNOSIS — M48061 Spinal stenosis, lumbar region without neurogenic claudication: Secondary | ICD-10-CM | POA: Diagnosis not present

## 2020-08-27 DIAGNOSIS — M48061 Spinal stenosis, lumbar region without neurogenic claudication: Secondary | ICD-10-CM | POA: Diagnosis not present

## 2020-08-27 DIAGNOSIS — M6281 Muscle weakness (generalized): Secondary | ICD-10-CM | POA: Diagnosis not present

## 2020-08-28 DIAGNOSIS — M48061 Spinal stenosis, lumbar region without neurogenic claudication: Secondary | ICD-10-CM | POA: Diagnosis not present

## 2020-08-28 DIAGNOSIS — M6281 Muscle weakness (generalized): Secondary | ICD-10-CM | POA: Diagnosis not present

## 2020-08-29 DIAGNOSIS — R319 Hematuria, unspecified: Secondary | ICD-10-CM | POA: Diagnosis not present

## 2020-08-29 DIAGNOSIS — M6281 Muscle weakness (generalized): Secondary | ICD-10-CM | POA: Diagnosis not present

## 2020-08-29 DIAGNOSIS — M48061 Spinal stenosis, lumbar region without neurogenic claudication: Secondary | ICD-10-CM | POA: Diagnosis not present

## 2020-08-29 DIAGNOSIS — N390 Urinary tract infection, site not specified: Secondary | ICD-10-CM | POA: Diagnosis not present

## 2020-09-01 DIAGNOSIS — N390 Urinary tract infection, site not specified: Secondary | ICD-10-CM | POA: Diagnosis not present

## 2020-09-01 DIAGNOSIS — M6281 Muscle weakness (generalized): Secondary | ICD-10-CM | POA: Diagnosis not present

## 2020-09-01 DIAGNOSIS — M48061 Spinal stenosis, lumbar region without neurogenic claudication: Secondary | ICD-10-CM | POA: Diagnosis not present

## 2020-09-02 DIAGNOSIS — M6281 Muscle weakness (generalized): Secondary | ICD-10-CM | POA: Diagnosis not present

## 2020-09-02 DIAGNOSIS — M48061 Spinal stenosis, lumbar region without neurogenic claudication: Secondary | ICD-10-CM | POA: Diagnosis not present

## 2020-09-04 DIAGNOSIS — M48061 Spinal stenosis, lumbar region without neurogenic claudication: Secondary | ICD-10-CM | POA: Diagnosis not present

## 2020-09-04 DIAGNOSIS — M6281 Muscle weakness (generalized): Secondary | ICD-10-CM | POA: Diagnosis not present

## 2020-09-05 DIAGNOSIS — M48061 Spinal stenosis, lumbar region without neurogenic claudication: Secondary | ICD-10-CM | POA: Diagnosis not present

## 2020-09-05 DIAGNOSIS — M6281 Muscle weakness (generalized): Secondary | ICD-10-CM | POA: Diagnosis not present

## 2020-09-07 DIAGNOSIS — M6281 Muscle weakness (generalized): Secondary | ICD-10-CM | POA: Diagnosis not present

## 2020-09-07 DIAGNOSIS — M48061 Spinal stenosis, lumbar region without neurogenic claudication: Secondary | ICD-10-CM | POA: Diagnosis not present

## 2020-09-08 DIAGNOSIS — M48061 Spinal stenosis, lumbar region without neurogenic claudication: Secondary | ICD-10-CM | POA: Diagnosis not present

## 2020-09-08 DIAGNOSIS — M6281 Muscle weakness (generalized): Secondary | ICD-10-CM | POA: Diagnosis not present

## 2020-09-09 DIAGNOSIS — M6281 Muscle weakness (generalized): Secondary | ICD-10-CM | POA: Diagnosis not present

## 2020-09-09 DIAGNOSIS — M48061 Spinal stenosis, lumbar region without neurogenic claudication: Secondary | ICD-10-CM | POA: Diagnosis not present

## 2020-09-10 DIAGNOSIS — I110 Hypertensive heart disease with heart failure: Secondary | ICD-10-CM | POA: Diagnosis not present

## 2020-09-10 DIAGNOSIS — J449 Chronic obstructive pulmonary disease, unspecified: Secondary | ICD-10-CM | POA: Diagnosis present

## 2020-09-10 DIAGNOSIS — R0902 Hypoxemia: Secondary | ICD-10-CM | POA: Diagnosis not present

## 2020-09-10 DIAGNOSIS — J9612 Chronic respiratory failure with hypercapnia: Secondary | ICD-10-CM | POA: Diagnosis present

## 2020-09-10 DIAGNOSIS — E1165 Type 2 diabetes mellitus with hyperglycemia: Secondary | ICD-10-CM | POA: Diagnosis present

## 2020-09-10 DIAGNOSIS — M48061 Spinal stenosis, lumbar region without neurogenic claudication: Secondary | ICD-10-CM | POA: Diagnosis not present

## 2020-09-10 DIAGNOSIS — Z7401 Bed confinement status: Secondary | ICD-10-CM | POA: Diagnosis not present

## 2020-09-10 DIAGNOSIS — E1142 Type 2 diabetes mellitus with diabetic polyneuropathy: Secondary | ICD-10-CM | POA: Diagnosis present

## 2020-09-10 DIAGNOSIS — I10 Essential (primary) hypertension: Secondary | ICD-10-CM | POA: Diagnosis not present

## 2020-09-10 DIAGNOSIS — W19XXXA Unspecified fall, initial encounter: Secondary | ICD-10-CM | POA: Diagnosis not present

## 2020-09-10 DIAGNOSIS — W050XXA Fall from non-moving wheelchair, initial encounter: Secondary | ICD-10-CM | POA: Diagnosis not present

## 2020-09-10 DIAGNOSIS — M6281 Muscle weakness (generalized): Secondary | ICD-10-CM | POA: Diagnosis not present

## 2020-09-10 DIAGNOSIS — I4891 Unspecified atrial fibrillation: Secondary | ICD-10-CM | POA: Diagnosis present

## 2020-09-10 DIAGNOSIS — S0993XA Unspecified injury of face, initial encounter: Secondary | ICD-10-CM | POA: Diagnosis not present

## 2020-09-10 DIAGNOSIS — J90 Pleural effusion, not elsewhere classified: Secondary | ICD-10-CM | POA: Diagnosis not present

## 2020-09-10 DIAGNOSIS — R404 Transient alteration of awareness: Secondary | ICD-10-CM | POA: Diagnosis not present

## 2020-09-10 DIAGNOSIS — Z043 Encounter for examination and observation following other accident: Secondary | ICD-10-CM | POA: Diagnosis not present

## 2020-09-10 DIAGNOSIS — Z950 Presence of cardiac pacemaker: Secondary | ICD-10-CM | POA: Diagnosis not present

## 2020-09-10 DIAGNOSIS — R69 Illness, unspecified: Secondary | ICD-10-CM | POA: Diagnosis not present

## 2020-09-10 DIAGNOSIS — R400 Somnolence: Secondary | ICD-10-CM | POA: Diagnosis not present

## 2020-09-10 DIAGNOSIS — Z20822 Contact with and (suspected) exposure to covid-19: Secondary | ICD-10-CM | POA: Diagnosis not present

## 2020-09-10 DIAGNOSIS — I5032 Chronic diastolic (congestive) heart failure: Secondary | ICD-10-CM | POA: Diagnosis not present

## 2020-09-10 DIAGNOSIS — S0181XA Laceration without foreign body of other part of head, initial encounter: Secondary | ICD-10-CM | POA: Diagnosis not present

## 2020-09-10 DIAGNOSIS — J9611 Chronic respiratory failure with hypoxia: Secondary | ICD-10-CM | POA: Diagnosis present

## 2020-09-10 DIAGNOSIS — J9811 Atelectasis: Secondary | ICD-10-CM | POA: Diagnosis not present

## 2020-09-10 DIAGNOSIS — Z794 Long term (current) use of insulin: Secondary | ICD-10-CM | POA: Diagnosis not present

## 2020-09-10 DIAGNOSIS — J9602 Acute respiratory failure with hypercapnia: Secondary | ICD-10-CM | POA: Diagnosis not present

## 2020-09-10 DIAGNOSIS — G4733 Obstructive sleep apnea (adult) (pediatric): Secondary | ICD-10-CM | POA: Diagnosis present

## 2020-09-10 DIAGNOSIS — R5381 Other malaise: Secondary | ICD-10-CM | POA: Diagnosis not present

## 2020-09-17 DIAGNOSIS — M6281 Muscle weakness (generalized): Secondary | ICD-10-CM | POA: Diagnosis not present

## 2020-09-17 DIAGNOSIS — J9601 Acute respiratory failure with hypoxia: Secondary | ICD-10-CM | POA: Diagnosis not present

## 2020-09-18 DIAGNOSIS — M6281 Muscle weakness (generalized): Secondary | ICD-10-CM | POA: Diagnosis not present

## 2020-09-18 DIAGNOSIS — J9601 Acute respiratory failure with hypoxia: Secondary | ICD-10-CM | POA: Diagnosis not present

## 2020-09-19 DIAGNOSIS — J9601 Acute respiratory failure with hypoxia: Secondary | ICD-10-CM | POA: Diagnosis not present

## 2020-09-19 DIAGNOSIS — M6281 Muscle weakness (generalized): Secondary | ICD-10-CM | POA: Diagnosis not present

## 2020-09-20 DIAGNOSIS — E119 Type 2 diabetes mellitus without complications: Secondary | ICD-10-CM | POA: Diagnosis not present

## 2020-09-20 DIAGNOSIS — I10 Essential (primary) hypertension: Secondary | ICD-10-CM | POA: Diagnosis not present

## 2020-09-22 DIAGNOSIS — J9601 Acute respiratory failure with hypoxia: Secondary | ICD-10-CM | POA: Diagnosis not present

## 2020-09-22 DIAGNOSIS — M6281 Muscle weakness (generalized): Secondary | ICD-10-CM | POA: Diagnosis not present

## 2020-09-23 DIAGNOSIS — M6281 Muscle weakness (generalized): Secondary | ICD-10-CM | POA: Diagnosis not present

## 2020-09-23 DIAGNOSIS — J9601 Acute respiratory failure with hypoxia: Secondary | ICD-10-CM | POA: Diagnosis not present

## 2020-09-24 DIAGNOSIS — M6281 Muscle weakness (generalized): Secondary | ICD-10-CM | POA: Diagnosis not present

## 2020-09-24 DIAGNOSIS — J9601 Acute respiratory failure with hypoxia: Secondary | ICD-10-CM | POA: Diagnosis not present

## 2020-09-25 DIAGNOSIS — J9601 Acute respiratory failure with hypoxia: Secondary | ICD-10-CM | POA: Diagnosis not present

## 2020-09-25 DIAGNOSIS — M6281 Muscle weakness (generalized): Secondary | ICD-10-CM | POA: Diagnosis not present

## 2020-09-26 DIAGNOSIS — M6281 Muscle weakness (generalized): Secondary | ICD-10-CM | POA: Diagnosis not present

## 2020-09-26 DIAGNOSIS — J9601 Acute respiratory failure with hypoxia: Secondary | ICD-10-CM | POA: Diagnosis not present

## 2020-09-29 DIAGNOSIS — J9601 Acute respiratory failure with hypoxia: Secondary | ICD-10-CM | POA: Diagnosis not present

## 2020-09-29 DIAGNOSIS — M6281 Muscle weakness (generalized): Secondary | ICD-10-CM | POA: Diagnosis not present

## 2020-09-30 DIAGNOSIS — J9601 Acute respiratory failure with hypoxia: Secondary | ICD-10-CM | POA: Diagnosis not present

## 2020-09-30 DIAGNOSIS — M6281 Muscle weakness (generalized): Secondary | ICD-10-CM | POA: Diagnosis not present

## 2020-10-02 DIAGNOSIS — G8929 Other chronic pain: Secondary | ICD-10-CM | POA: Diagnosis not present

## 2020-10-02 DIAGNOSIS — F321 Major depressive disorder, single episode, moderate: Secondary | ICD-10-CM | POA: Diagnosis not present

## 2020-10-03 DIAGNOSIS — S51819A Laceration without foreign body of unspecified forearm, initial encounter: Secondary | ICD-10-CM | POA: Diagnosis not present

## 2020-10-08 ENCOUNTER — Ambulatory Visit (INDEPENDENT_AMBULATORY_CARE_PROVIDER_SITE_OTHER): Payer: Medicare Other

## 2020-10-08 DIAGNOSIS — I495 Sick sinus syndrome: Secondary | ICD-10-CM

## 2020-10-10 LAB — CUP PACEART REMOTE DEVICE CHECK
Battery Remaining Longevity: 132 mo
Battery Voltage: 3.11 V
Brady Statistic AP VP Percent: 72.9 %
Brady Statistic AP VS Percent: 0 %
Brady Statistic AS VP Percent: 27.02 %
Brady Statistic AS VS Percent: 0.08 %
Brady Statistic RA Percent Paced: 72.77 %
Brady Statistic RV Percent Paced: 99.92 %
Date Time Interrogation Session: 20211221221748
Implantable Lead Implant Date: 19960418
Implantable Lead Implant Date: 19960418
Implantable Lead Location: 753859
Implantable Lead Location: 753860
Implantable Lead Model: 5034
Implantable Lead Model: 5534
Implantable Pulse Generator Implant Date: 20210312
Lead Channel Impedance Value: 646 Ohm
Lead Channel Impedance Value: 798 Ohm
Lead Channel Impedance Value: 798 Ohm
Lead Channel Impedance Value: 836 Ohm
Lead Channel Pacing Threshold Amplitude: 0.625 V
Lead Channel Pacing Threshold Amplitude: 1.5 V
Lead Channel Pacing Threshold Pulse Width: 0.4 ms
Lead Channel Pacing Threshold Pulse Width: 0.4 ms
Lead Channel Sensing Intrinsic Amplitude: 1.625 mV
Lead Channel Sensing Intrinsic Amplitude: 1.625 mV
Lead Channel Setting Pacing Amplitude: 1.5 V
Lead Channel Setting Pacing Amplitude: 3 V
Lead Channel Setting Pacing Pulse Width: 0.4 ms
Lead Channel Setting Sensing Sensitivity: 0.9 mV

## 2020-10-15 DIAGNOSIS — Z23 Encounter for immunization: Secondary | ICD-10-CM | POA: Diagnosis not present

## 2020-10-22 NOTE — Progress Notes (Signed)
Remote pacemaker transmission.   

## 2021-01-07 ENCOUNTER — Ambulatory Visit (INDEPENDENT_AMBULATORY_CARE_PROVIDER_SITE_OTHER): Payer: Medicare Other

## 2021-01-07 DIAGNOSIS — I495 Sick sinus syndrome: Secondary | ICD-10-CM

## 2021-01-07 LAB — CUP PACEART REMOTE DEVICE CHECK
Battery Remaining Longevity: 126 mo
Battery Voltage: 3.05 V
Brady Statistic AP VP Percent: 38.51 %
Brady Statistic AP VS Percent: 0 %
Brady Statistic AS VP Percent: 61.29 %
Brady Statistic AS VS Percent: 0.2 %
Brady Statistic RA Percent Paced: 38.07 %
Brady Statistic RV Percent Paced: 99.8 %
Date Time Interrogation Session: 20220323021146
Implantable Lead Implant Date: 19960418
Implantable Lead Implant Date: 19960418
Implantable Lead Location: 753859
Implantable Lead Location: 753860
Implantable Lead Model: 5034
Implantable Lead Model: 5534
Implantable Pulse Generator Implant Date: 20210312
Lead Channel Impedance Value: 703 Ohm
Lead Channel Impedance Value: 722 Ohm
Lead Channel Impedance Value: 741 Ohm
Lead Channel Impedance Value: 836 Ohm
Lead Channel Pacing Threshold Amplitude: 0.625 V
Lead Channel Pacing Threshold Amplitude: 1.625 V
Lead Channel Pacing Threshold Pulse Width: 0.4 ms
Lead Channel Pacing Threshold Pulse Width: 0.4 ms
Lead Channel Sensing Intrinsic Amplitude: 2.125 mV
Lead Channel Sensing Intrinsic Amplitude: 2.125 mV
Lead Channel Setting Pacing Amplitude: 1.5 V
Lead Channel Setting Pacing Amplitude: 3.25 V
Lead Channel Setting Pacing Pulse Width: 0.4 ms
Lead Channel Setting Sensing Sensitivity: 0.9 mV

## 2021-01-16 NOTE — Progress Notes (Signed)
Remote pacemaker transmission.   

## 2021-04-08 ENCOUNTER — Ambulatory Visit (INDEPENDENT_AMBULATORY_CARE_PROVIDER_SITE_OTHER): Payer: Medicare Other

## 2021-04-08 DIAGNOSIS — I495 Sick sinus syndrome: Secondary | ICD-10-CM

## 2021-04-08 LAB — CUP PACEART REMOTE DEVICE CHECK
Battery Remaining Longevity: 102 mo
Battery Voltage: 3.02 V
Brady Statistic AP VP Percent: 36.93 %
Brady Statistic AP VS Percent: 0 %
Brady Statistic AS VP Percent: 62.88 %
Brady Statistic AS VS Percent: 0.19 %
Brady Statistic RA Percent Paced: 36.55 %
Brady Statistic RV Percent Paced: 99.81 %
Date Time Interrogation Session: 20220621204442
Implantable Lead Implant Date: 19960418
Implantable Lead Implant Date: 19960418
Implantable Lead Location: 753859
Implantable Lead Location: 753860
Implantable Lead Model: 5034
Implantable Lead Model: 5534
Implantable Pulse Generator Implant Date: 20210312
Lead Channel Impedance Value: 741 Ohm
Lead Channel Impedance Value: 741 Ohm
Lead Channel Impedance Value: 798 Ohm
Lead Channel Impedance Value: 912 Ohm
Lead Channel Pacing Threshold Amplitude: 0.625 V
Lead Channel Pacing Threshold Amplitude: 2.125 V
Lead Channel Pacing Threshold Pulse Width: 0.4 ms
Lead Channel Pacing Threshold Pulse Width: 0.4 ms
Lead Channel Sensing Intrinsic Amplitude: 2.125 mV
Lead Channel Sensing Intrinsic Amplitude: 2.125 mV
Lead Channel Setting Pacing Amplitude: 1.5 V
Lead Channel Setting Pacing Amplitude: 4.25 V
Lead Channel Setting Pacing Pulse Width: 0.4 ms
Lead Channel Setting Sensing Sensitivity: 0.9 mV

## 2021-04-28 NOTE — Progress Notes (Signed)
Remote pacemaker transmission.   

## 2021-07-08 ENCOUNTER — Ambulatory Visit (INDEPENDENT_AMBULATORY_CARE_PROVIDER_SITE_OTHER): Payer: Medicare Other

## 2021-07-08 DIAGNOSIS — I495 Sick sinus syndrome: Secondary | ICD-10-CM | POA: Diagnosis not present

## 2021-07-08 LAB — CUP PACEART REMOTE DEVICE CHECK
Battery Remaining Longevity: 106 mo
Battery Voltage: 3.01 V
Brady Statistic AP VP Percent: 35.15 %
Brady Statistic AP VS Percent: 0 %
Brady Statistic AS VP Percent: 64.48 %
Brady Statistic AS VS Percent: 0.12 %
Brady Statistic RA Percent Paced: 11.37 %
Brady Statistic RV Percent Paced: 99.93 %
Date Time Interrogation Session: 20220921013845
Implantable Lead Implant Date: 19960418
Implantable Lead Implant Date: 19960418
Implantable Lead Location: 753859
Implantable Lead Location: 753860
Implantable Lead Model: 5034
Implantable Lead Model: 5534
Implantable Pulse Generator Implant Date: 20210312
Lead Channel Impedance Value: 1159 Ohm
Lead Channel Impedance Value: 1273 Ohm
Lead Channel Impedance Value: 722 Ohm
Lead Channel Impedance Value: 722 Ohm
Lead Channel Pacing Threshold Amplitude: 0.625 V
Lead Channel Pacing Threshold Amplitude: 2.25 V
Lead Channel Pacing Threshold Pulse Width: 0.4 ms
Lead Channel Pacing Threshold Pulse Width: 0.4 ms
Lead Channel Sensing Intrinsic Amplitude: 0.75 mV
Lead Channel Sensing Intrinsic Amplitude: 0.75 mV
Lead Channel Setting Pacing Amplitude: 1.5 V
Lead Channel Setting Pacing Amplitude: 4.75 V
Lead Channel Setting Pacing Pulse Width: 0.4 ms
Lead Channel Setting Sensing Sensitivity: 0.9 mV

## 2021-07-15 NOTE — Progress Notes (Signed)
Remote pacemaker transmission.   

## 2021-08-18 DEATH — deceased
